# Patient Record
Sex: Male | Born: 1940 | Race: White | Hispanic: No | Marital: Married | Smoking: Former smoker
Health system: Southern US, Community
[De-identification: ages and names within clinical notes are randomized; demographics above are authoritative.]

## PROBLEM LIST (undated history)

## (undated) DIAGNOSIS — C3490 Malignant neoplasm of unspecified part of unspecified bronchus or lung: Secondary | ICD-10-CM

## (undated) DIAGNOSIS — I519 Heart disease, unspecified: Secondary | ICD-10-CM

## (undated) DIAGNOSIS — K5900 Constipation, unspecified: Secondary | ICD-10-CM

## (undated) DIAGNOSIS — I2510 Atherosclerotic heart disease of native coronary artery without angina pectoris: Secondary | ICD-10-CM

## (undated) DIAGNOSIS — D649 Anemia, unspecified: Secondary | ICD-10-CM

## (undated) DIAGNOSIS — R918 Other nonspecific abnormal finding of lung field: Secondary | ICD-10-CM

## (undated) DIAGNOSIS — Z923 Personal history of irradiation: Secondary | ICD-10-CM

## (undated) DIAGNOSIS — C61 Malignant neoplasm of prostate: Secondary | ICD-10-CM

## (undated) DIAGNOSIS — Z9289 Personal history of other medical treatment: Secondary | ICD-10-CM

## (undated) DIAGNOSIS — E785 Hyperlipidemia, unspecified: Secondary | ICD-10-CM

## (undated) DIAGNOSIS — I739 Peripheral vascular disease, unspecified: Secondary | ICD-10-CM

## (undated) DIAGNOSIS — I10 Essential (primary) hypertension: Secondary | ICD-10-CM

## (undated) DIAGNOSIS — IMO0002 Reserved for concepts with insufficient information to code with codable children: Secondary | ICD-10-CM

## (undated) DIAGNOSIS — C7931 Secondary malignant neoplasm of brain: Secondary | ICD-10-CM

## (undated) DIAGNOSIS — I219 Acute myocardial infarction, unspecified: Secondary | ICD-10-CM

## (undated) DIAGNOSIS — C7951 Secondary malignant neoplasm of bone: Secondary | ICD-10-CM

## (undated) HISTORY — DX: Peripheral vascular disease, unspecified: I73.9

## (undated) HISTORY — PX: HERNIA REPAIR: SHX51

## (undated) HISTORY — PX: SHOULDER SURGERY: SHX246

## (undated) HISTORY — PX: CORONARY ARTERY BYPASS GRAFT: SHX141

## (undated) HISTORY — DX: Atherosclerotic heart disease of native coronary artery without angina pectoris: I25.10

## (undated) HISTORY — DX: Essential (primary) hypertension: I10

## (undated) HISTORY — PX: WISDOM TOOTH EXTRACTION: SHX21

## (undated) HISTORY — DX: Acute myocardial infarction, unspecified: I21.9

## (undated) HISTORY — DX: Reserved for concepts with insufficient information to code with codable children: IMO0002

## (undated) HISTORY — DX: Heart disease, unspecified: I51.9

## (undated) HISTORY — DX: Hyperlipidemia, unspecified: E78.5

## (undated) HISTORY — PX: COLONOSCOPY: SHX174

## (undated) HISTORY — PX: ANAL FISSURECTOMY: SUR608

---

## 1998-09-23 HISTORY — PX: PR VEIN BYPASS GRAFT,AORTO-FEM-POP: 35551

## 1999-02-24 ENCOUNTER — Inpatient Hospital Stay (HOSPITAL_COMMUNITY): Admission: EM | Admit: 1999-02-24 | Discharge: 1999-03-10 | Payer: Self-pay | Admitting: Emergency Medicine

## 1999-02-25 ENCOUNTER — Encounter: Payer: Self-pay | Admitting: Cardiology

## 1999-02-26 ENCOUNTER — Encounter: Payer: Self-pay | Admitting: Cardiology

## 1999-03-07 ENCOUNTER — Encounter: Payer: Self-pay | Admitting: Gastroenterology

## 1999-03-29 ENCOUNTER — Encounter (HOSPITAL_COMMUNITY): Admission: RE | Admit: 1999-03-29 | Discharge: 1999-06-27 | Payer: Self-pay | Admitting: Cardiology

## 1999-04-05 ENCOUNTER — Ambulatory Visit (HOSPITAL_COMMUNITY): Admission: RE | Admit: 1999-04-05 | Discharge: 1999-04-05 | Payer: Self-pay | Admitting: Cardiology

## 1999-04-16 ENCOUNTER — Encounter: Payer: Self-pay | Admitting: Cardiothoracic Surgery

## 1999-04-18 ENCOUNTER — Inpatient Hospital Stay (HOSPITAL_COMMUNITY): Admission: RE | Admit: 1999-04-18 | Discharge: 1999-04-23 | Payer: Self-pay | Admitting: Cardiothoracic Surgery

## 1999-04-18 ENCOUNTER — Encounter: Payer: Self-pay | Admitting: Cardiothoracic Surgery

## 1999-04-19 ENCOUNTER — Encounter: Payer: Self-pay | Admitting: Cardiothoracic Surgery

## 1999-04-20 ENCOUNTER — Encounter: Payer: Self-pay | Admitting: Cardiothoracic Surgery

## 1999-04-21 ENCOUNTER — Encounter: Payer: Self-pay | Admitting: Cardiothoracic Surgery

## 1999-05-22 ENCOUNTER — Encounter (HOSPITAL_COMMUNITY): Admission: RE | Admit: 1999-05-22 | Discharge: 1999-08-20 | Payer: Self-pay | Admitting: Cardiology

## 1999-08-21 ENCOUNTER — Encounter (HOSPITAL_COMMUNITY): Admission: RE | Admit: 1999-08-21 | Discharge: 1999-08-27 | Payer: Self-pay | Admitting: Cardiology

## 1999-11-30 ENCOUNTER — Ambulatory Visit (HOSPITAL_BASED_OUTPATIENT_CLINIC_OR_DEPARTMENT_OTHER): Admission: RE | Admit: 1999-11-30 | Discharge: 1999-11-30 | Payer: Self-pay | Admitting: Surgery

## 2000-04-21 ENCOUNTER — Ambulatory Visit (HOSPITAL_BASED_OUTPATIENT_CLINIC_OR_DEPARTMENT_OTHER): Admission: RE | Admit: 2000-04-21 | Discharge: 2000-04-21 | Payer: Self-pay | Admitting: Surgery

## 2000-06-13 ENCOUNTER — Ambulatory Visit (HOSPITAL_BASED_OUTPATIENT_CLINIC_OR_DEPARTMENT_OTHER): Admission: RE | Admit: 2000-06-13 | Discharge: 2000-06-13 | Payer: Self-pay | Admitting: Surgery

## 2000-08-29 ENCOUNTER — Encounter: Payer: Self-pay | Admitting: General Surgery

## 2000-09-02 ENCOUNTER — Ambulatory Visit (HOSPITAL_COMMUNITY): Admission: RE | Admit: 2000-09-02 | Discharge: 2000-09-02 | Payer: Self-pay | Admitting: General Surgery

## 2011-06-10 ENCOUNTER — Other Ambulatory Visit: Payer: Self-pay | Admitting: Urology

## 2011-06-10 DIAGNOSIS — C61 Malignant neoplasm of prostate: Secondary | ICD-10-CM

## 2011-06-19 ENCOUNTER — Encounter (HOSPITAL_COMMUNITY): Payer: Self-pay

## 2011-06-19 ENCOUNTER — Encounter (HOSPITAL_COMMUNITY)
Admission: RE | Admit: 2011-06-19 | Discharge: 2011-06-19 | Disposition: A | Discharge status: Home / Self Care | Payer: Medicare Other | Source: Ambulatory Visit | Attending: Urology | Admitting: Urology

## 2011-06-19 DIAGNOSIS — C61 Malignant neoplasm of prostate: Secondary | ICD-10-CM | POA: Insufficient documentation

## 2011-06-19 HISTORY — DX: Malignant neoplasm of prostate: C61

## 2011-06-19 MED ORDER — TECHNETIUM TC 99M MEDRONATE IV KIT
23.5000 | PACK | Freq: Once | INTRAVENOUS | Status: AC | PRN
  Administered 2011-06-19: 23.5 via INTRAVENOUS

## 2011-07-25 ENCOUNTER — Encounter: Payer: Self-pay | Admitting: Vascular Surgery

## 2011-07-25 DIAGNOSIS — C61 Malignant neoplasm of prostate: Secondary | ICD-10-CM

## 2011-07-25 HISTORY — DX: Malignant neoplasm of prostate: C61

## 2011-07-25 HISTORY — PX: PROSTATE SURGERY: SHX751

## 2011-07-29 ENCOUNTER — Encounter: Payer: Self-pay | Admitting: Vascular Surgery

## 2011-07-30 ENCOUNTER — Ambulatory Visit (INDEPENDENT_AMBULATORY_CARE_PROVIDER_SITE_OTHER): Payer: Medicare Other | Admitting: Vascular Surgery

## 2011-07-30 ENCOUNTER — Encounter: Payer: Self-pay | Admitting: Vascular Surgery

## 2011-07-30 ENCOUNTER — Encounter (HOSPITAL_COMMUNITY): Payer: Self-pay | Admitting: Pharmacy Technician

## 2011-07-30 VITALS — BP 133/64 | HR 82 | Resp 20 | Ht 69.0 in | Wt 193.0 lb

## 2011-07-30 DIAGNOSIS — I714 Abdominal aortic aneurysm, without rupture: Secondary | ICD-10-CM

## 2011-07-30 NOTE — Progress Notes (Signed)
The patient presents today for evaluation of incidental finding of abdominal aortic aneurysm. He was undergoing CT scan for evaluation of diagnosis of prostate cancer. 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 I have reviewed his films and discussed them at length with the patient and his wife present. He denies any symptoms ruffled of this and does not have any past history of aneurysm. He is to go or a prostate resection.  Past Medical History  Diagnosis Date  . Prostate cancer   . Ulcer   . Gout   . Heart disease   . Myocardial infarction   . Hyperlipidemia   . Hypertension     History  Substance Use Topics  . Smoking status: Former Smoker -- 1.5 packs/day for 43 years    Types: Cigarettes    Quit date: 09/23/2001  . Smokeless tobacco: Never Used  . Alcohol Use: 8.4 oz/week    14 Glasses of wine per week    Family History  Problem Relation Age of Onset  . Heart failure Mother   . Diabetes Mother   . Heart disease Father   . Heart disease Brother     No Known Allergies  Current outpatient prescriptions:aspirin EC 81 MG tablet, Take 81 mg by mouth daily.  , Disp: , Rfl: ;  fish oil-omega-3 fatty acids 1000 MG capsule, Take 2 g by mouth daily.  , Disp: , Rfl: ;  folic acid (FOLVITE) 800 MCG tablet, Take 400 mcg by mouth daily.  , Disp: , Rfl: ;  metoprolol (TOPROL-XL) 50 MG 24 hr tablet, Take 50 mg by mouth daily.  , Disp: , Rfl:  Multiple Vitamin (MULTIVITAMIN) capsule, Take 1 capsule by mouth daily.  , Disp: , Rfl: ;  niacin (NIASPAN) 1000 MG CR tablet, Take 1,000 mg by mouth at bedtime.  , Disp: , Rfl: ;  omeprazole (PRILOSEC) 20 MG capsule, Take 20 mg by mouth daily.  , Disp: , Rfl: ;  pravastatin (PRAVACHOL) 40 MG tablet, Take 40 mg by mouth daily.  , Disp: , Rfl: ;  ramipril (ALTACE) 10 MG capsule, Take 10 mg by mouth daily.  , Disp: , Rfl:  triamterene-hydrochlorothiazide (DYAZIDE) 37.5-25 MG per capsule, Take 1 capsule by mouth every  morning.  , Disp: , Rfl: ;  colchicine 0.6 MG tablet, Take 0.6 mg by mouth daily.  , Disp: , Rfl: ;  silodosin (RAPAFLO) 8 MG CAPS capsule, Take 8 mg by mouth daily with breakfast.  , Disp: , Rfl:   BP 133/64  Pulse 82  Resp 20  Ht 5\' 9"  (1.753 m)  Wt 193 lb (87.544 kg)  BMI 28.50 kg/m2  Body mass index is 28.50 kg/(m^2).       Review of systems: No weight loss or gain. GI history of ulcer bleeding. GU urinary frequency. Otherwise negative.  Physical exam: Well-developed well-nourished white male appearing stated age in no acute distress. HEENT normal. Chest clear bilaterally without rales rhonchi or wheezes. Heart regular rate and rhythm. Carotids without bruits bilaterally. 2+ radial pulses bilaterally. 1-2+ right femoral pulse absent left femoral pulse. No palpable pedal pulses bilaterally. Musculoskeletal no major deformities or cyanosis. Abdomen soft nontender no masses noted no aneurysm palpable. Neurologic no focal weakness or paresthesias. Skin no ulcers or rashes. Psychiatric normal affect.  CT scan: Infrarenal small 3.5 cm aneurysm. 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  Impression and plan: Small abdominal aortic aneurysm. Left iliac artery occlusion. He denies any claudication symptoms and is limited by shortness of breath rather than claudication. I explained we would recommend yearly ultrasound followup of his small aneurysm. I explained to be extremely rare 2 at risk of right true to size. I did explain symptoms of leaking aneurysm explain that he would need to go emergently to the [facility] emergency department. We will see him again in one year for continued discussion

## 2011-07-31 ENCOUNTER — Ambulatory Visit (HOSPITAL_COMMUNITY)
Admission: RE | Admit: 2011-07-31 | Discharge: 2011-07-31 | Disposition: A | Discharge status: Home / Self Care | Payer: Medicare Other | Source: Ambulatory Visit | Attending: Urology | Admitting: Urology

## 2011-07-31 ENCOUNTER — Encounter (HOSPITAL_COMMUNITY): Payer: Medicare Other

## 2011-07-31 ENCOUNTER — Encounter (HOSPITAL_COMMUNITY): Payer: Self-pay

## 2011-07-31 DIAGNOSIS — R059 Cough, unspecified: Secondary | ICD-10-CM | POA: Insufficient documentation

## 2011-07-31 DIAGNOSIS — I252 Old myocardial infarction: Secondary | ICD-10-CM | POA: Insufficient documentation

## 2011-07-31 DIAGNOSIS — I10 Essential (primary) hypertension: Secondary | ICD-10-CM | POA: Insufficient documentation

## 2011-07-31 DIAGNOSIS — I2510 Atherosclerotic heart disease of native coronary artery without angina pectoris: Secondary | ICD-10-CM | POA: Insufficient documentation

## 2011-07-31 DIAGNOSIS — Z01818 Encounter for other preprocedural examination: Secondary | ICD-10-CM | POA: Insufficient documentation

## 2011-07-31 DIAGNOSIS — R05 Cough: Secondary | ICD-10-CM | POA: Insufficient documentation

## 2011-07-31 DIAGNOSIS — Z951 Presence of aortocoronary bypass graft: Secondary | ICD-10-CM | POA: Insufficient documentation

## 2011-07-31 DIAGNOSIS — Z87891 Personal history of nicotine dependence: Secondary | ICD-10-CM | POA: Insufficient documentation

## 2011-07-31 LAB — CBC
HCT: 41.1 % (ref 39.0–52.0)
MCHC: 36.5 g/dL — ABNORMAL HIGH (ref 30.0–36.0)
Platelets: 107 10*3/uL — ABNORMAL LOW (ref 150–400)
RDW: 13.1 % (ref 11.5–15.5)

## 2011-07-31 LAB — BASIC METABOLIC PANEL
BUN: 13 mg/dL (ref 6–23)
Calcium: 9.7 mg/dL (ref 8.4–10.5)
Chloride: 100 mEq/L (ref 96–112)
Creatinine, Ser: 0.9 mg/dL (ref 0.50–1.35)
GFR calc Af Amer: >90 mL/min (ref >90)
GFR calc non Af Amer: 84 mL/min — ABNORMAL LOW (ref >90)

## 2011-07-31 LAB — SURGICAL PCR SCREEN: Staphylococcus aureus: NEGATIVE

## 2011-07-31 NOTE — Patient Instructions (Signed)
20 DEV DHONDT  07/31/2011   Your procedure is scheduled on:  08/08/11  Report to [facility] at 5:15 AM.  Call this number if you have problems the morning of surgery: [phone]   Remember:   Do not eat food:After Midnight.  Do not drink clear liquids: After Midnight.  Take these medicines the morning of surgery with A SIP OF WATER: METOPROLOL / PRILOSEC   Do not wear jewelry, make-up or nail polish.  Do not wear lotions, powders, or perfumes. You may wear deodorant.  Do not shave 48 hours prior to surgery.  Do not bring valuables to the hospital.  Contacts, dentures or bridgework may not be worn into surgery.  Leave suitcase in the car. After surgery it may be brought to your room.  For patients admitted to the hospital, checkout time is 11:00 AM the day of discharge.   Patients discharged the day of surgery will not be allowed to drive home.  Name and phone number of your driver:   Special Instructions: Incentive Spirometry - Practice and bring it with you on the day of surgery. and CHG Shower Use Special Wash: 1/2 bottle night before surgery and 1/2 bottle morning of surgery.    FOLLOW BOWEL PREP INSTRUCTIONS   Please read over the following fact sheets that you were given: MRSA Information

## 2011-08-01 ENCOUNTER — Encounter (HOSPITAL_COMMUNITY): Payer: Self-pay

## 2011-08-08 ENCOUNTER — Inpatient Hospital Stay (HOSPITAL_COMMUNITY): Payer: Medicare Other | Admitting: Anesthesiology

## 2011-08-08 ENCOUNTER — Other Ambulatory Visit: Payer: Self-pay | Admitting: Urology

## 2011-08-08 ENCOUNTER — Encounter (HOSPITAL_COMMUNITY): Payer: Self-pay | Admitting: Anesthesiology

## 2011-08-08 ENCOUNTER — Encounter (HOSPITAL_COMMUNITY): Payer: Self-pay | Admitting: *Deleted

## 2011-08-08 ENCOUNTER — Inpatient Hospital Stay (HOSPITAL_COMMUNITY)
Admission: RE | Admit: 2011-08-08 | Discharge: 2011-08-09 | DRG: 708 | Disposition: A | Discharge status: Home / Self Care | Payer: Medicare Other | Source: Ambulatory Visit | Attending: Urology | Admitting: Urology

## 2011-08-08 ENCOUNTER — Encounter (HOSPITAL_COMMUNITY)
Admission: RE | Disposition: A | Discharge status: Home / Self Care | Payer: Self-pay | Source: Ambulatory Visit | Attending: Urology

## 2011-08-08 DIAGNOSIS — M109 Gout, unspecified: Secondary | ICD-10-CM | POA: Diagnosis present

## 2011-08-08 DIAGNOSIS — Z951 Presence of aortocoronary bypass graft: Secondary | ICD-10-CM

## 2011-08-08 DIAGNOSIS — I2510 Atherosclerotic heart disease of native coronary artery without angina pectoris: Secondary | ICD-10-CM | POA: Diagnosis present

## 2011-08-08 DIAGNOSIS — C61 Malignant neoplasm of prostate: Principal | ICD-10-CM | POA: Diagnosis present

## 2011-08-08 DIAGNOSIS — E7800 Pure hypercholesterolemia, unspecified: Secondary | ICD-10-CM | POA: Diagnosis present

## 2011-08-08 DIAGNOSIS — I252 Old myocardial infarction: Secondary | ICD-10-CM

## 2011-08-08 DIAGNOSIS — I10 Essential (primary) hypertension: Secondary | ICD-10-CM | POA: Diagnosis present

## 2011-08-08 HISTORY — PX: ROBOT ASSISTED LAPAROSCOPIC RADICAL PROSTATECTOMY: SHX5141

## 2011-08-08 LAB — BASIC METABOLIC PANEL
BUN: 9 mg/dL (ref 6–23)
Calcium: 8.5 mg/dL (ref 8.4–10.5)
Creatinine, Ser: 1.02 mg/dL (ref 0.50–1.35)
GFR calc Af Amer: 83 mL/min — ABNORMAL LOW (ref >90)
GFR calc non Af Amer: 72 mL/min — ABNORMAL LOW (ref >90)

## 2011-08-08 LAB — ABO/RH: ABO/RH(D): O POS

## 2011-08-08 LAB — HEMOGLOBIN AND HEMATOCRIT, BLOOD: Hemoglobin: 12.6 g/dL — ABNORMAL LOW (ref 13.0–17.0)

## 2011-08-08 SURGERY — ROBOTIC ASSISTED LAPAROSCOPIC RADICAL PROSTATECTOMY LEVEL 2
Anesthesia: General | Site: Abdomen | Laterality: Bilateral | Wound class: Clean Contaminated

## 2011-08-08 MED ORDER — DEXTROSE-NACL 5-0.45 % IV SOLN
INTRAVENOUS | Status: DC
  Administered 2011-08-08: 13:00:00 via INTRAVENOUS

## 2011-08-08 MED ORDER — HYDROMORPHONE HCL PF 1 MG/ML IJ SOLN
0.2500 mg | INTRAMUSCULAR | Status: DC | PRN
  Administered 2011-08-08 (×2): 0.5 mg via INTRAVENOUS

## 2011-08-08 MED ORDER — INDIGOTINDISULFONATE SODIUM 8 MG/ML IJ SOLN
INTRAMUSCULAR | Status: DC | PRN
  Administered 2011-08-08 (×2): 5 mL via INTRAVENOUS

## 2011-08-08 MED ORDER — RAMIPRIL 10 MG PO CAPS
10.0000 mg | ORAL_CAPSULE | ORAL | Status: DC
  Administered 2011-08-09: 10 mg via ORAL
  Filled 2011-08-08 (×2): qty 1

## 2011-08-08 MED ORDER — PANTOPRAZOLE SODIUM 40 MG PO TBEC
40.0000 mg | DELAYED_RELEASE_TABLET | Freq: Every day | ORAL | Status: DC
  Administered 2011-08-08: 40 mg via ORAL
  Filled 2011-08-08: qty 1

## 2011-08-08 MED ORDER — SIMVASTATIN 40 MG PO TABS
40.0000 mg | ORAL_TABLET | Freq: Every day | ORAL | Status: DC
  Administered 2011-08-08: 40 mg via ORAL
  Filled 2011-08-08 (×2): qty 1

## 2011-08-08 MED ORDER — EPHEDRINE SULFATE 50 MG/ML IJ SOLN
INTRAMUSCULAR | Status: DC | PRN
  Administered 2011-08-08: 10 mg via INTRAVENOUS

## 2011-08-08 MED ORDER — SODIUM CHLORIDE 0.9 % IR SOLN
Status: DC | PRN
  Administered 2011-08-08: 200 mL

## 2011-08-08 MED ORDER — LIDOCAINE HCL (PF) 2 % IJ SOLN
INTRAMUSCULAR | Status: DC | PRN
  Administered 2011-08-08: 50 mg

## 2011-08-08 MED ORDER — BELLADONNA ALKALOIDS-OPIUM 16.2-60 MG RE SUPP: 1.0000 | Freq: Four times a day (QID) | RECTAL | Status: DC | PRN

## 2011-08-08 MED ORDER — ACETAMINOPHEN 325 MG PO TABS: 650.0000 mg | ORAL_TABLET | ORAL | Status: DC | PRN

## 2011-08-08 MED ORDER — LACTATED RINGERS IV SOLN
INTRAVENOUS | Status: DC | PRN
  Administered 2011-08-08: 08:00:00

## 2011-08-08 MED ORDER — HYDROMORPHONE HCL PF 1 MG/ML IJ SOLN
INTRAMUSCULAR | Status: DC | PRN
  Administered 2011-08-08: 1 mg via INTRAVENOUS

## 2011-08-08 MED ORDER — CEFAZOLIN SODIUM 1-5 GM-% IV SOLN
1.0000 g | Freq: Three times a day (TID) | INTRAVENOUS | Status: DC
  Filled 2011-08-08 (×2): qty 50

## 2011-08-08 MED ORDER — MORPHINE SULFATE 2 MG/ML IJ SOLN: 2.0000 mg | INTRAMUSCULAR | Status: DC | PRN

## 2011-08-08 MED ORDER — LACTATED RINGERS IV SOLN: INTRAVENOUS | Status: DC

## 2011-08-08 MED ORDER — PROPOFOL 10 MG/ML IV EMUL
INTRAVENOUS | Status: DC | PRN
  Administered 2011-08-08: 160 mg via INTRAVENOUS

## 2011-08-08 MED ORDER — ZOLPIDEM TARTRATE 5 MG PO TABS: 5.0000 mg | ORAL_TABLET | Freq: Every evening | ORAL | Status: DC | PRN

## 2011-08-08 MED ORDER — MIDAZOLAM HCL 5 MG/5ML IJ SOLN
INTRAMUSCULAR | Status: DC | PRN
  Administered 2011-08-08: 2 mg via INTRAVENOUS

## 2011-08-08 MED ORDER — BISACODYL 10 MG RE SUPP: 10.0000 mg | Freq: Every day | RECTAL | Status: DC | PRN

## 2011-08-08 MED ORDER — METOPROLOL TARTRATE 50 MG PO TABS
50.0000 mg | ORAL_TABLET | Freq: Two times a day (BID) | ORAL | Status: DC
  Administered 2011-08-08 – 2011-08-09 (×2): 50 mg via ORAL
  Filled 2011-08-08 (×3): qty 1

## 2011-08-08 MED ORDER — ACETAMINOPHEN 10 MG/ML IV SOLN
INTRAVENOUS | Status: DC | PRN
  Administered 2011-08-08: 1000 mg via INTRAVENOUS

## 2011-08-08 MED ORDER — BUPIVACAINE-EPINEPHRINE 0.25% -1:200000 IJ SOLN
INTRAMUSCULAR | Status: DC | PRN
  Administered 2011-08-08: 30 mL

## 2011-08-08 MED ORDER — ONDANSETRON HCL 4 MG/2ML IJ SOLN
INTRAMUSCULAR | Status: DC | PRN
  Administered 2011-08-08: 4 mg via INTRAVENOUS

## 2011-08-08 MED ORDER — TRIAMTERENE-HCTZ 37.5-25 MG PO CAPS
1.0000 | ORAL_CAPSULE | ORAL | Status: DC
  Administered 2011-08-09: 1 via ORAL
  Filled 2011-08-08 (×2): qty 1

## 2011-08-08 MED ORDER — GLYCOPYRROLATE 0.2 MG/ML IJ SOLN
INTRAMUSCULAR | Status: DC | PRN
  Administered 2011-08-08: .6 mg via INTRAVENOUS

## 2011-08-08 MED ORDER — SUCCINYLCHOLINE CHLORIDE 20 MG/ML IJ SOLN
INTRAMUSCULAR | Status: DC | PRN
  Administered 2011-08-08: 100 mg via INTRAVENOUS

## 2011-08-08 MED ORDER — PHENOL 1.4 % MT LIQD: 1.0000 | OROMUCOSAL | Status: DC | PRN

## 2011-08-08 MED ORDER — HYDROCODONE-ACETAMINOPHEN 5-325 MG PO TABS: 1.0000 | ORAL_TABLET | ORAL | Status: DC | PRN

## 2011-08-08 MED ORDER — CEFAZOLIN SODIUM-DEXTROSE 2-3 GM-% IV SOLR
2.0000 g | Freq: Once | INTRAVENOUS | Status: AC
  Administered 2011-08-08: 2 g via INTRAVENOUS
  Filled 2011-08-08: qty 50

## 2011-08-08 MED ORDER — DEXAMETHASONE SODIUM PHOSPHATE 4 MG/ML IJ SOLN
INTRAMUSCULAR | Status: DC | PRN
  Administered 2011-08-08: 10 mg via INTRAVENOUS

## 2011-08-08 MED ORDER — FENTANYL CITRATE 0.05 MG/ML IJ SOLN
INTRAMUSCULAR | Status: DC | PRN
  Administered 2011-08-08: 100 ug via INTRAVENOUS

## 2011-08-08 MED ORDER — SODIUM CHLORIDE 0.9 % IV BOLUS (SEPSIS)
1000.0000 mL | Freq: Once | INTRAVENOUS | Status: AC
  Administered 2011-08-08: 1000 mL via INTRAVENOUS

## 2011-08-08 MED ORDER — MENTHOL 3 MG MT LOZG
1.0000 | LOZENGE | OROMUCOSAL | Status: DC | PRN
  Filled 2011-08-08: qty 9

## 2011-08-08 MED ORDER — NEOSTIGMINE METHYLSULFATE 1 MG/ML IJ SOLN
INTRAMUSCULAR | Status: DC | PRN
  Administered 2011-08-08: 5 mg via INTRAVENOUS

## 2011-08-08 MED ORDER — LACTATED RINGERS IV SOLN
INTRAVENOUS | Status: DC | PRN
  Administered 2011-08-08 (×4): via INTRAVENOUS

## 2011-08-08 MED ORDER — NIACIN ER (ANTIHYPERLIPIDEMIC) 500 MG PO TBCR
2000.0000 mg | EXTENDED_RELEASE_TABLET | Freq: Every evening | ORAL | Status: DC
  Administered 2011-08-08: 2000 mg via ORAL
  Filled 2011-08-08 (×2): qty 4

## 2011-08-08 MED ORDER — KETOROLAC TROMETHAMINE 15 MG/ML IJ SOLN
15.0000 mg | Freq: Four times a day (QID) | INTRAMUSCULAR | Status: DC
  Administered 2011-08-08 – 2011-08-09 (×3): 15 mg via INTRAVENOUS
  Filled 2011-08-08 (×6): qty 1

## 2011-08-08 MED ORDER — CEFAZOLIN SODIUM-DEXTROSE 2-3 GM-% IV SOLR
2.0000 g | Freq: Three times a day (TID) | INTRAVENOUS | Status: DC
  Filled 2011-08-08 (×2): qty 50

## 2011-08-08 MED ORDER — ROCURONIUM BROMIDE 100 MG/10ML IV SOLN
INTRAVENOUS | Status: DC | PRN
  Administered 2011-08-08: 10 mg via INTRAVENOUS
  Administered 2011-08-08: 5 mg via INTRAVENOUS
  Administered 2011-08-08: 50 mg via INTRAVENOUS
  Administered 2011-08-08: 5 mg via INTRAVENOUS

## 2011-08-08 MED ORDER — ONDANSETRON HCL 4 MG/2ML IJ SOLN: 4.0000 mg | INTRAMUSCULAR | Status: DC | PRN

## 2011-08-08 MED ORDER — CEFAZOLIN SODIUM-DEXTROSE 2-3 GM-% IV SOLR
2.0000 g | Freq: Three times a day (TID) | INTRAVENOUS | Status: AC
  Administered 2011-08-08 – 2011-08-09 (×2): 2 g via INTRAVENOUS
  Filled 2011-08-08 (×2): qty 50

## 2011-08-08 MED ORDER — STERILE WATER FOR IRRIGATION IR SOLN
Status: DC | PRN
  Administered 2011-08-08: 3000 mL

## 2011-08-08 SURGICAL SUPPLY — 34 items
CANISTER SUCTION 2500CC (MISCELLANEOUS) ×2 IMPLANT
CATH ROBINSON RED A/P 8FR (CATHETERS) ×2 IMPLANT
CHLORAPREP W/TINT 26ML (MISCELLANEOUS) ×2 IMPLANT
CLIP LIGATING HEM O LOK PURPLE (MISCELLANEOUS) ×4 IMPLANT
CLOTH BEACON ORANGE TIMEOUT ST (SAFETY) ×2 IMPLANT
CORD HIGH FREQUENCY UNIPOLAR (ELECTROSURGICAL) ×1 IMPLANT
COVER SURGICAL LIGHT HANDLE (MISCELLANEOUS) ×2 IMPLANT
COVER TIP SHEARS 8 DVNC (MISCELLANEOUS) ×1 IMPLANT
COVER TIP SHEARS 8MM DA VINCI (MISCELLANEOUS) ×1
CUTTER LINEAR FLEX ECHELON 45 (STAPLE) ×2 IMPLANT
DECANTER SPIKE VIAL GLASS SM (MISCELLANEOUS) ×1 IMPLANT
DRAPE SURG IRRIG POUCH 19X23 (DRAPES) ×2 IMPLANT
DRAPE UTILITY XL STRL (DRAPES) ×2 IMPLANT
DRSG TEGADERM 6X8 (GAUZE/BANDAGES/DRESSINGS) ×4 IMPLANT
ELECT REM PT RETURN 9FT ADLT (ELECTROSURGICAL) ×2
ELECTRODE REM PT RTRN 9FT ADLT (ELECTROSURGICAL) ×1 IMPLANT
GLOVE BIOGEL M STRL SZ7.5 (GLOVE) ×4 IMPLANT
GOWN STRL NON-REIN LRG LVL3 (GOWN DISPOSABLE) ×6 IMPLANT
HOLDER FOLEY CATH W/STRAP (MISCELLANEOUS) ×2 IMPLANT
IV LACTATED RINGERS 1000ML (IV SOLUTION) ×2 IMPLANT
KIT ACCESSORY DA VINCI DISP (KITS) ×1
KIT ACCESSORY DVNC DISP (KITS) ×1 IMPLANT
NDL SAFETY ECLIPSE 18X1.5 (NEEDLE) ×1 IMPLANT
NEEDLE HYPO 18GX1.5 SHARP (NEEDLE) ×2
PACK ROBOT UROLOGY CUSTOM (CUSTOM PROCEDURE TRAY) ×2 IMPLANT
POSITIONER SURGICAL ARM (MISCELLANEOUS) ×2 IMPLANT
RELOAD GREEN ECHELON 45 (STAPLE) ×2 IMPLANT
SET TUBE IRRIG SUCTION NO TIP (IRRIGATION / IRRIGATOR) ×2 IMPLANT
SOLUTION ELECTROLUBE (MISCELLANEOUS) ×2 IMPLANT
SPONGE GAUZE 4X4 12PLY (GAUZE/BANDAGES/DRESSINGS) ×2 IMPLANT
SUT VICRYL 0 UR6 27IN ABS (SUTURE) ×4 IMPLANT
SYR 27GX1/2 1ML LL SAFETY (SYRINGE) ×2 IMPLANT
TOWEL OR NON WOVEN STRL DISP B (DISPOSABLE) ×2 IMPLANT
WATER STERILE IRR 1500ML POUR (IV SOLUTION) ×4 IMPLANT

## 2011-08-08 NOTE — Interval H&P Note (Signed)
History and Physical Interval Note:   08/08/2011   6:51 AM   Tony [name]  has presented today for surgery, with the diagnosis of prostate cancer   The various methods of treatment have been discussed with the patient and family. After consideration of risks, benefits and other options for treatment, the patient has consented to  Procedure(s): ROBOTIC ASSISTED LAPAROSCOPIC RADICAL PROSTATECTOMY and BPLND as a surgical intervention .  The patients' history has been reviewed, patient examined, no change in status, stable for surgery.  I have reviewed the patients' chart and labs.  Questions were answered to the patient's satisfaction.     [name],LES  MD

## 2011-08-08 NOTE — Transfer of Care (Signed)
Immediate Anesthesia Transfer of Care Note  Patient: Tony [name]  Procedure(s) Performed:  ROBOTIC ASSISTED LAPAROSCOPIC RADICAL PROSTATECTOMY LEVEL 2 - with Bilateral Pelvic Lymphadenectomy   Patient Location: PACU  Anesthesia Type: [name]  Level of Consciousness: awake and sedated  Airway & Oxygen Therapy: Patient Spontanous Breathing and Patient connected to face mask oxygen  Post-op Assessment: Report given to PACU RN and Post -op Vital signs reviewed and stable  Post vital signs: Reviewed and stable  Complications: No apparent anesthesia complications

## 2011-08-08 NOTE — Preoperative (Signed)
Beta Blockers   Reason not to administer Beta Blockers:Not Applicable 

## 2011-08-08 NOTE — Progress Notes (Signed)
Ambulated pt. Around entire unit. Pt. Tolerated ambulating very well. He did not c/o any pain/nausea/dizziness. Urine output did increase as we were ambulating around unit but remains dark. Will continue to monitor urinary output and ambulate patient again prior to shift change.

## 2011-08-08 NOTE — Op Note (Signed)
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 Robotic assisted laparoscopic radical prostatectomy (non nerve sparing) 2. Bilateral robotic assisted laparoscopic pelvic lymphadenectomy  Surgeon: Moody Bruins. M.D.  Assistant(s): Pecola Leisure, [place]  Anesthesia: General  Complications: None  EBL: 100 mL  IVF:  2000 mL crystalloid  Specimens: 1. Prostate and seminal vesicles 2. Right pelvic lymph nodes 3. Left pelvic lymph nodes  Disposition of specimens: Pathology  Drains: 1. 20 Fr coude catheter 2. # 19 Blake pelvic drain  Indication: Tony [name] is a 70 y.o. year old patient with clinically localized prostate cancer.  After a thorough review of the management options for treatment of prostate cancer, he elected to proceed with surgical therapy and the above procedure(s).  We have discussed the potential benefits and risks of the procedure, side effects of the proposed treatment, the likelihood of the patient achieving the goals of the procedure, and any potential problems that might occur during the procedure or recuperation. Informed consent has been obtained.  Description of procedure:  The patient was taken to the operating room and a general anesthetic was administered. He was given preoperative antibiotics, placed in the dorsal lithotomy position, and prepped and draped in the usual sterile fashion. Next a preoperative timeout was performed. A urethral catheter was placed into the bladder and a site was selected near the umbilicus for placement of the camera port. This was placed using a standard open Hassan technique which allowed entry into the peritoneal cavity under direct vision and without difficulty. A 12 mm port was placed and a pneumoperitoneum established. The camera was then  used to inspect the abdomen and there was no evidence of any intra-abdominal injuries or other abnormalities. The remaining abdominal ports were then placed. 8 mm robotic ports were placed in the right lower quadrant, left lower quadrant, and far left lateral abdominal wall. A 5 mm port was placed in the right upper quadrant and a 12 mm port was placed in the right lateral abdominal wall for laparoscopic assistance. All ports were placed under direct vision without difficulty. The surgical cart was then docked.   Utilizing the cautery scissors, the bladder was reflected posteriorly allowing entry into the space of Retzius and identification of the endopelvic fascia and prostate. The periprostatic fat was then removed from the prostate allowing full exposure of the endopelvic fascia. The endopelvic fascia was then incised from the apex back to the base of the prostate bilaterally and the underlying levator muscle fibers were swept laterally off the prostate thereby isolating the dorsal venous complex. The dorsal vein was then stapled and divided with a 45 mm Flex Echelon stapler. Attention then turned to the bladder neck which was divided anteriorly thereby allowing entry into the bladder and exposure of the urethral catheter. The catheter balloon was deflated and the catheter was brought into the operative field and used to retract the prostate anteriorly. The posterior bladder neck was then examined and was divided allowing further dissection between the bladder and prostate posteriorly until the vasa deferentia and seminal vessels were identified. The vasa deferentia were isolated, divided, and lifted anteriorly. The seminal vesicles were dissected down to their tips with care to control the seminal vascular arterial blood supply. These structures were then lifted anteriorly and the space between Denonvillier's fascia and the anterior rectum was developed with a combination  of sharp and blunt dissection. This  isolated the vascular pedicles of the prostate.  A wide non nerve sparing dissection was performed with Weck clips used to ligate the vascular pedicles of the prostate bilaterally. The vascular pedicles of the prostate were then divided.  The urethra was then sharply transected allowing the prostate specimen to be disarticulated. The pelvis was copiously irrigated and hemostasis was ensured. There was no evidence for rectal injury.  Attention then turned to the right pelvic sidewall. The fibrofatty tissue between the external iliac vein, confluence of the iliac vessels, hypogastric artery, and Cooper's ligament was dissected free from the pelvic sidewall with care to preserve the obturator nerve. Weck clips were used for lymphostasis and hemostasis. An identical procedure was performed on the contralateral side and the lymphatic packets were removed for permanent pathologic analysis.  Attention then turned to the urethral anastomosis. A 2-0 Vicryl slip knot was placed between Denonvillier's fascia, the posterior bladder neck, and the posterior urethra to reapproximate these structures. A double-armed 3-0 Monocryl suture was then used to perform a 360 running tension-free anastomosis between the bladder neck and urethra. A new urethral catheter was then placed into the bladder and irrigated. There were no blood clots within the bladder and the anastomosis appeared to be watertight. A #19 Blake drain was then brought through the left lateral 8 mm port site and positioned appropriately within the pelvis. It was secured to the skin with a nylon suture. The surgical cart was then undocked. The right lateral 12 mm port site was closed at the fascial level with a 0 Vicryl suture placed laparoscopically. All remaining ports were then removed under direct vision. The prostate specimen was removed intact within the Endopouch retrieval bag via the periumbilical camera port site. This fascial opening was closed with  two running 0 Vicryl sutures. 0.25% Marcaine was then injected into all port sites and all incisions were reapproximated at the skin level with staples. Sterile dressings were applied. The patient appeared to tolerate the procedure well and without complications. The patient was able to be extubated and transferred to the recovery unit in satisfactory condition.  Moody Bruins MD

## 2011-08-08 NOTE — Progress Notes (Signed)
Post-op note  Subjective: The patient is doing well.  No complaints.  Objective: Vital signs in last 24 hours: Temp:  [97.3 F (36.3 C)-98.3 F (36.8 C)] 97.9 F (36.6 C) (11/15 1500) Pulse Rate:  [60-72] 72  (11/15 1500) Resp:  [12-18] 18  (11/15 1500) BP: (107-149)/(45-75) 141/75 mmHg (11/15 1500) SpO2:  [98 %-100 %] 99 % (11/15 1500) Weight:  [94.802 kg (209 lb)] 209 lb (94.802 kg) (11/15 1500)  Intake/Output from previous day:   Intake/Output this shift: Total I/O In: 4380 [I.V.:3100; Other:280; IV Piggyback:1000] Out: 320 [Drains:120; Blood:200]  Physical Exam:  General: Alert and oriented. Abdomen: Soft, Nondistended. Incisions: Clean and dry. GU: Urine is draining well.  Lab Results:  Basename 08/08/11 1100  HGB 12.6*  HCT 36.3*    Assessment/Plan: POD#0   1) Continue to monitor   Moody Bruins. MD   LOS: 0 days   Vylette Strubel,LES 08/08/2011, 4:40 PM

## 2011-08-08 NOTE — Anesthesia Postprocedure Evaluation (Signed)
  Anesthesia Post-op Note  Patient: Tony [name]  Procedure(s) Performed:  ROBOTIC ASSISTED LAPAROSCOPIC RADICAL PROSTATECTOMY LEVEL 2 - with Bilateral Pelvic Lymphadenectomy   Patient Location: PACU  Anesthesia Type: General  Level of Consciousness: awake and alert   Airway and Oxygen Therapy: Patient Spontanous Breathing  Post-op Pain: mild  Post-op Assessment: Post-op Vital signs reviewed, Patient's Cardiovascular Status Stable, Respiratory Function Stable, Patent Airway and No signs of Nausea or vomiting  Post-op Vital Signs: stable  Complications: No apparent anesthesia complications

## 2011-08-08 NOTE — Discharge Summary (Signed)
Date of admission: 08/08/2011  Date of discharge: 08/09/2011  Admission diagnosis: Prostate Cancer  Discharge diagnosis: Prostate Cancer  History and Physical: For full details, please see admission history and physical. Briefly, Tony [name] is a 70 y.o. gentleman with localized prostate cancer.  After discussing management/treatment options, he elected to proceed with surgical treatment.  Hospital Course: SEDRIC GUIA was taken to the operating room on 08/08/2011 and underwent a robotic assisted laparoscopic radical prostatectomy. He tolerated this procedure well and without complications. Postoperatively, he was able to be transferred to a regular hospital room following recovery from anesthesia.  He was able to begin ambulating the night of surgery. He remained hemodynamically stable overnight.    He had mildly decreased urine output with appropriately minimal output from his pelvic drain and his pelvic drain was removed on POD #1.  His foley was irrigated successfully in the PACU as well as in his room prior to D/C with adequate return each time.  A JP Cr was checked on POD 1 and was normal. He was transitioned to oral pain medication, tolerated a clear liquid diet, and had met all discharge criteria and was able to be discharged home later on POD#1.  Laboratory values:  Basename 08/09/11 0502 08/08/11 1100  HGB 12.4* 12.6*  HCT 34.2* 36.3*   JP Creatine: 1.0  Disposition: Home  Discharge instruction: He was instructed to be ambulatory but to refrain from heavy lifting, strenuous activity, or driving. He was instructed on urethral catheter care.  He may resume taking aspirin and supplements approx 7 days after surgery.  Discharge medications:   Medication List  As of 08/09/2011  1:18 PM   START taking these medications         ciprofloxacin 500 MG tablet   Commonly known as: CIPRO   Take 1 tablet (500 mg total) by mouth 2 (two) times daily. Start day prior to office visit for  foley removal      HYDROcodone-acetaminophen 5-325 MG per tablet   Commonly known as: NORCO   Take 1-2 tablets by mouth every 6 (six) hours as needed (pain).         CONTINUE taking these medications         colchicine 0.6 MG tablet      metoprolol 50 MG tablet   Commonly known as: LOPRESSOR      niacin 1000 MG CR tablet   Commonly known as: NIASPAN      omeprazole 20 MG capsule   Commonly known as: PRILOSEC      pravastatin 40 MG tablet   Commonly known as: PRAVACHOL      ramipril 10 MG capsule   Commonly known as: ALTACE      triamterene-hydrochlorothiazide 37.5-25 MG per capsule   Commonly known as: DYAZIDE         STOP taking these medications         aspirin EC 81 MG tablet      fish oil-omega-3 fatty acids 1000 MG capsule      folic acid 800 MCG tablet      JALYN 0.5-0.4 MG Caps      multivitamin capsule          Where to get your medications    These are the prescriptions that you need to pick up.   You may get these medications from any pharmacy.         ciprofloxacin 500 MG tablet   HYDROcodone-acetaminophen 5-325 MG per tablet  Followup: He will followup in 1 week for catheter removal, staple removal, and to discuss his surgical pathology results.

## 2011-08-08 NOTE — Progress Notes (Signed)
Pt did bowel prep as instructed on 08/07/11 with good results

## 2011-08-08 NOTE — Anesthesia Procedure Notes (Signed)
Procedure Name: Intubation Date/Time: 08/08/2011 7:20 AM Performed by: Joycie Peek Pre-anesthesia Checklist: Patient identified, Emergency Drugs available, Suction available, Patient being monitored and Timeout performed Patient Re-evaluated:Patient Re-evaluated prior to inductionOxygen Delivery Method: Circle System Utilized Preoxygenation: Pre-oxygenation with 100% oxygen Intubation Type: IV induction Ventilation: Mask ventilation without difficulty Laryngoscope Size: Mac and 4 Grade View: Grade II Tube type: Oral Tube size: 7.5 mm Number of attempts: 1 Placement Confirmation: ETT inserted through vocal cords under direct vision,  positive ETCO2 and breath sounds checked- equal and bilateral Secured at: 23 cm Tube secured with: Tape Dental Injury: Teeth and Oropharynx as per pre-operative assessment

## 2011-08-08 NOTE — Anesthesia Preprocedure Evaluation (Addendum)
Anesthesia Evaluation  Patient identified by MRN, date of birth, ID band Patient awake    Reviewed: Allergy & Precautions, H&P , NPO status , Patient's Chart, lab work & pertinent test results  Airway Mallampati: II TM Distance: >3 FB Neck ROM: Full    Dental  (+) Teeth Intact and Caps   Pulmonary neg pulmonary ROS,    Pulmonary exam normal       Cardiovascular hypertension, + CAD, + Past MI, + CABG and neg cardio ROS Regular Normal    Neuro/Psych Negative Neurological ROS  Negative Psych ROS   GI/Hepatic negative GI ROS, Neg liver ROS,   Endo/Other  Negative Endocrine ROS  Renal/GU negative Renal ROS  Genitourinary negative   Musculoskeletal negative musculoskeletal ROS (+)   Abdominal Normal abdominal exam  (+)   Peds negative pediatric ROS (+)  Hematology negative hematology ROS (+)   Anesthesia Other Findings   Reproductive/Obstetrics negative OB ROS                           Anesthesia Physical Anesthesia Plan  ASA: III  Anesthesia Plan: General   Post-op Pain Management:    Induction: Intravenous  Airway Management Planned: Oral ETT  Additional Equipment:   Intra-op Plan:   Post-operative Plan: Extubation in OR  Informed Consent: I have reviewed the patients History and Physical, chart, labs and discussed the procedure including the risks, benefits and alternatives for the proposed anesthesia with the patient or authorized representative who has indicated his/her understanding and acceptance.   Dental advisory given  Plan Discussed with:   Anesthesia Plan Comments:         Anesthesia Quick Evaluation

## 2011-08-08 NOTE — H&P (Addendum)
Chief Complaint  Prostate Cancer   Reason For Visit  Reason for consult: To consider surgical treatment for prostate cancer with a robotic prostatectomy. Physician requesting consult: Dr. Marcine Matar PCP: Dr. Camillo Flaming   History of Present Illness  Tony [name] is a 70 year old who was initially diagnosed with prostate cancer by Dr. Retta Diones in July 2009.  His presenting PSA was 7.26 and his biopsy at the time indicated 2 out of 12 biopsy cores positive for Gleason 3+3=6 adenocarcinoma with less than 5% involvement of each core. He elected active surveillance as a management strategy after discussing all available options.  In May 2012, his PSA was noted to have increased to 11.18 and he underwent a surveillance biopsy in September 2012 which demonstrated 3 out of 12 biopsy cores to be positive for malignacy with Gleason 3+4-7 adenocarcinoma now noted along with areas of intraductal carcinoma and cribiform glands concerning for higher risk disease. He has no family history of prostate cancer. He has undergone staging studies with a bone scan (06/19/11) and CT scan (06/12/11) which did not demonstrate evidence for metastatic disease.  His CT scan did demonstrate a 3.5 cm infrarenal abdominal aortic aneurysm. He was seen by Dr. Gretta [name] and is going undergo observation for his AAA.  He has a history of CAD and prior MI and CABG in 2000.  His cardiologist is Dr. Viann Fish.  He underwent a stress test last week and reportedly had no inducible ischemia.  TNM stage: cT1c N0 M0 PSA: 11.18 Gleason score/grade: 3+4=7 with intraductal carcinoma Biopsy (06/03/11): 3 /12 cores -- R mid (10%, 3+4=7 with adjacent intraductal carcinoma), R base (30%, intraductal carcinoma), R lateral base (5%, 3+3=6) Prostate volume: 105.4 cc  Urinary function: He has known BPH and has been treated with alpha blockers and 5 alpha reductase inhibitors with improvement of his symptoms. IPSS: 14. Erectile function: He  has severe erectile dysfunction. SHIM score: 6.   Past Medical History Problems  1. History of  Acute Myocardial Infarction V12.59 2. History of  Aneurysm Of The Abdominal Aorta 441.4 3. History of  Gastric Ulcer 531.90 4. History of  Gout 274.9 5. History of  Heart Disease 429.9 6. History of  Hypercholesterolemia 272.0 7. History of  Hypertension 401.9  Surgical History Problems  1. History of  Anal Fissurectomy 2. History of  CABG (CABG) V45.81  Current Meds 1. Aspirin 81 MG Oral Tablet Chewable; Therapy: (Recorded:10Mar2009) to 2. Colcrys 0.6 MG Oral Tablet; Therapy: 13Jan2012 to 3. Fish Oil Concentrate 1000 MG Oral Capsule; Therapy: (Recorded:13Sep2012) to 4. Folic Acid 800 MCG Oral Tablet; Therapy: (Recorded:10Mar2009) to 5. Metoprolol Tartrate 50 MG Oral Tablet; Therapy: (Recorded:10Mar2009) to 6. Multi-Vitamin TABS; Therapy: (Recorded:10Mar2009) to 7. Niaspan 1000 MG Oral Tablet Extended Release; TAKE 2 TABLET Daily; Therapy:  (Recorded:07Sep2010) to 8. Omeprazole 20 MG Oral Capsule Delayed Release; Therapy: (Recorded:10Mar2009) to 9. Pravastatin Sodium 40 MG Oral Tablet; Therapy: (Recorded:10Mar2009) to 10. Ramipril 10 MG Oral Capsule; Therapy: (Recorded:10Mar2009) to 11. Rapaflo 8 MG Oral Capsule; 1 po q day; Therapy: 13Sep2012 to (Last Rx:13Sep2012) 12. Triamterene-HCTZ 37.5-25 MG Oral Tablet; Therapy: (Recorded:10Mar2009) to  Allergies Medication  1. No Known Drug Allergies  Family History Problems  1. Maternal history of  Congestive Heart Failure 2. Maternal history of  Diabetes Mellitus V18.0 3. Sororal history of  Diabetes Mellitus V18.0 4. Paternal history of  Heart Disease V17.49 5. Fraternal history of  Heart Disease V17.49 6. Sororal history of  Heart Disease V17.49  Social History Problems    Alcohol Use 2 glasses of wine a day   Former Smoker 1 1/2 ppd for 43 years. Quit 9 years ago   Marital History - Currently Married   Occupation:  Retired  Review of Systems Constitutional, skin, eye, otolaryngeal, hematologic/lymphatic, cardiovascular, pulmonary, endocrine, musculoskeletal, gastrointestinal, neurological and psychiatric system(s) were reviewed and pertinent findings if present are noted.  Cardiovascular: no chest pain and no leg swelling.  Respiratory: no shortness of breath during exertion.    Vitals Vital Signs [Data Includes: Last 1 Day]  06Nov2012 11:18AM  BMI Calculated: 28.58 BSA Calculated: 2.03 Height: 5 ft 9 in Weight: 193 lb  Blood Pressure: 117 / 68 Heart Rate: 62  Physical Exam Constitutional: Well nourished and well developed . No acute distress.  ENT:. The ears and nose are normal in appearance.  Neck: The appearance of the neck is normal and no neck mass is present.  Pulmonary: No respiratory distress, normal respiratory rhythm and effort and clear bilateral breath sounds.  Cardiovascular: Heart rate and rhythm are normal . No peripheral edema.  Abdomen: The abdomen is soft and nontender. No masses are palpated. No CVA tenderness. No hernias are palpable. No hepatosplenomegaly noted.  Rectal: Rectal exam demonstrates normal sphincter tone, no tenderness and no masses. Prostate size is estimated to be 60 g. The prostate has no nodularity and is not tender. The left seminal vesicle is nonpalpable. The right seminal vesicle is nonpalpable. The perineum is normal on inspection.  Lymphatics: The femoral and inguinal nodes are not enlarged or tender.  Skin: Normal skin turgor, no visible rash and no visible skin lesions.  Neuro/Psych:. Mood and affect are appropriate.    Results/Data  I reviewed his medical records, PSA results, pathology reports, CT scan and bone scan independently with findings as dictated above.     Assessment Assessed  1. Prostate Cancer 185  Discussion/Summary  1. Prostate cancer:   The patient was counseled about the natural history of prostate cancer and the standard  treatment options that are available for prostate cancer. It was explained to him how his age and life expectancy, clinical stage, Gleason score, and PSA affect his prognosis, the decision to proceed with additional staging studies, as well as how that information influences recommended treatment strategies. We discussed the roles for active surveillance, radiation therapy, surgical therapy, androgen deprivation, as well as ablative therapy options for the treatment of prostate cancer as appropriate to his individual cancer situation. We discussed the risks and benefits of these options with regard to their impact on cancer control and also in terms of potential adverse events, complications, and impact on quiality of life particularly related to urinary, bowel, and sexual function. The patient was encouraged to ask questions throughout the discussion today and all questions were answered to his stated satisfaction. In addition, the patient was provided with and/or directed to appropriate resources and literature for further education about prostate cancer and treatment options.   We discussed surgical therapy for prostate cancer including the different available surgical approaches. We discussed, in detail, the risks and expectations of surgery with regard to cancer control, urinary control, and erectile function as well as the expected postoperative recovery process. Additional risks of surgery including but not limited to bleeding, infection, hernia formation, nerve damage, lymphocele formation, bowel/rectal injury potentially necessitating colostomy, damage to the urinary tract resulting in urine leakage, urethral stricture, and the cardiopulmonary risks such as myocardial infarction, stroke, death, venothromboembolism, etc. were explained. The  risk of open surgical conversion for robotic/laparoscopic prostatectomy was also discussed.     We discussed his diagnosis of intraductal carcinoma and the fact this  likely represents aggressive disease. I agree with proceeding with curative treatment at this point then have reviewed the pros and cons of surgery and radiation therapy. Considering his pathologic diagnosis, significantly enlarged prostate with voiding symptoms, and history of anal fissure repair, he understands there are some advantages of surgery over radiation and also has a clear understanding of the perioperative risks involved considering his comorbidities. He also understands the increased risk of urinary incontinence based on his age. He does wish to proceed with surgical therapy. I will see if he can be worked in for physical therapy prior to his planned surgery date.  I will plan to await his recent results from his stress test and he has been scheduled for a non-nerve sparing robotic prostatectomy and bilateral pelvic lymphadenectomy.  CC: Dr. Camillo Flaming Dr. Marcine Matar Dr. Tawanna Cooler Early Dr. Viann Fish    SignaturesElectronically signed by : Heloise Purpura, M.D.; Jul 30 2011  6:00PM

## 2011-08-09 ENCOUNTER — Encounter (HOSPITAL_COMMUNITY): Payer: Self-pay | Admitting: Urology

## 2011-08-09 LAB — BASIC METABOLIC PANEL
BUN: 10 mg/dL (ref 6–23)
Calcium: 8.2 mg/dL — ABNORMAL LOW (ref 8.4–10.5)
Creatinine, Ser: 1.04 mg/dL (ref 0.50–1.35)
GFR calc Af Amer: 81 mL/min — ABNORMAL LOW (ref >90)
GFR calc non Af Amer: 70 mL/min — ABNORMAL LOW (ref >90)
Glucose, Bld: 176 mg/dL — ABNORMAL HIGH (ref 70–99)

## 2011-08-09 MED ORDER — HYDROCODONE-ACETAMINOPHEN 5-325 MG PO TABS: 1.0000 | ORAL_TABLET | Freq: Four times a day (QID) | ORAL | Status: DC | PRN

## 2011-08-09 MED ORDER — CIPROFLOXACIN HCL 500 MG PO TABS: 500.0000 mg | ORAL_TABLET | Freq: Two times a day (BID) | ORAL | Status: AC

## 2011-08-09 MED ORDER — HYDROCODONE-ACETAMINOPHEN 5-325 MG PO TABS: 1.0000 | ORAL_TABLET | Freq: Four times a day (QID) | ORAL | Status: AC | PRN

## 2011-08-09 MED ORDER — BISACODYL 10 MG RE SUPP
10.0000 mg | Freq: Once | RECTAL | Status: AC
  Administered 2011-08-09: 10 mg via RECTAL
  Filled 2011-08-09: qty 1

## 2011-08-09 NOTE — Progress Notes (Signed)
Pt. Was very anxious to get home. The leg bag  teaching was done. The  Peripheral IV was removed intact.The JP drain was removed. The telemetry monitor was removed. Pt. was educated in leg bag care. The was discharged to the house by the use of a wheelchair to the front door of the hospital for discharge.

## 2011-08-09 NOTE — Discharge Summary (Deleted)
Date of admission: 08/08/2011  Date of discharge: 08/09/2011  Admission diagnosis: Prostate Cancer  Discharge diagnosis: Prostate Cancer  History and Physical: For full details, please see admission history and physical. Briefly, Tony [name] is a 70 y.o. gentleman with localized prostate cancer.  After discussing management/treatment options, he elected to proceed with surgical treatment.  Hospital Course: Tony [name] was taken to the operating room on 08/08/2011 and underwent a robotic assisted laparoscopic radical prostatectomy. He tolerated this procedure well and without complications. Postoperatively, he was able to be transferred to a regular hospital room following recovery from anesthesia.  He was able to begin ambulating the night of surgery. He remained hemodynamically stable overnight.  He had excellent urine output with appropriately minimal output from his pelvic drain and his pelvic drain was removed on POD #1.  He was transitioned to oral pain medication, tolerated a clear liquid diet, and had met all discharge criteria and was able to be discharged home later on POD#1.  Laboratory values:  Basename 08/09/11 0502 08/08/11 1100  HGB 12.4* 12.6*  HCT 34.2* 36.3*    Disposition: Home  Discharge instruction: He was instructed to be ambulatory but to refrain from heavy lifting, strenuous activity, or driving. He was instructed on urethral catheter care.  Discharge medications:   Medication List  As of 08/09/2011  6:28 PM   START taking these medications         ciprofloxacin 500 MG tablet   Commonly known as: CIPRO   Take 1 tablet (500 mg total) by mouth 2 (two) times daily. Start day prior to office visit for foley removal      HYDROcodone-acetaminophen 5-325 MG per tablet   Commonly known as: NORCO   Take 1-2 tablets by mouth every 6 (six) hours as needed (pain).         CONTINUE taking these medications         colchicine 0.6 MG tablet      metoprolol 50  MG tablet   Commonly known as: LOPRESSOR      niacin 1000 MG CR tablet   Commonly known as: NIASPAN      omeprazole 20 MG capsule   Commonly known as: PRILOSEC      pravastatin 40 MG tablet   Commonly known as: PRAVACHOL      ramipril 10 MG capsule   Commonly known as: ALTACE      triamterene-hydrochlorothiazide 37.5-25 MG per capsule   Commonly known as: DYAZIDE         STOP taking these medications         aspirin EC 81 MG tablet      fish oil-omega-3 fatty acids 1000 MG capsule      folic acid 800 MCG tablet      JALYN 0.5-0.4 MG Caps      multivitamin capsule          Where to get your medications    These are the prescriptions that you need to pick up.   You may get these medications from any pharmacy.         ciprofloxacin 500 MG tablet   HYDROcodone-acetaminophen 5-325 MG per tablet            Followup: He will followup in 1 week for catheter removal and to discuss his surgical pathology results.

## 2011-08-09 NOTE — Progress Notes (Signed)
  1 Day Post-Op Subjective: The patient is doing well.  No nausea or vomiting. Pain is adequately controlled.  Objective: Vital signs in last 24 hours: Temp:  [97.3 F (36.3 C)-98.4 F (36.9 C)] 98.3 F (36.8 C) (11/16 0542) Pulse Rate:  [54-85] 54  (11/16 0542) Resp:  [12-18] 18  (11/16 0542) BP: (104-141)/(45-75) 108/59 mmHg (11/16 0542) SpO2:  [96 %-100 %] 96 % (11/16 0542) Weight:  [94.802 kg (209 lb)] 209 lb (94.802 kg) (11/15 1500)  Intake/Output from previous day: 11/15 0701 - 11/16 0700 In: 6140 [P.O.:360; I.V.:4500; IV Piggyback:1000] Out: 1065 [Urine:600; Drains:265; Blood:200] Intake/Output this shift:    Physical Exam:  General: Alert and oriented. CV: RRR Lungs: Clear bilaterally. GI: Soft, Nondistended. Incisions: Dressings intact. Urine: Clear Extremities: Nontender, no erythema, no edema.  Lab Results:  Basename 08/09/11 0502 08/08/11 1100  HGB 12.4* 12.6*  HCT 34.2* 36.3*          Basename 08/09/11 0502 08/08/11 1100  CREATININE 1.04 1.02           Results for orders placed during the hospital encounter of 08/08/11 (from the past 24 hour(s))  BASIC METABOLIC PANEL     Status: Abnormal   Collection Time   08/08/11 11:00 AM      Component Value Range   Sodium 137  135 - 145 (mEq/L)   Potassium 3.3 (*) 3.5 - 5.1 (mEq/L)   Chloride 100  96 - 112 (mEq/L)   CO2 25  19 - 32 (mEq/L)   Glucose, Bld 137 (*) 70 - 99 (mg/dL)   BUN 9  6 - 23 (mg/dL)   Creatinine, Ser 4.13  0.50 - 1.35 (mg/dL)   Calcium 8.5  8.4 - 24.4 (mg/dL)   GFR calc non Af Amer 72 (*) >90 (mL/min)   GFR calc Af Amer 83 (*) >90 (mL/min)  HEMOGLOBIN AND HEMATOCRIT, BLOOD     Status: Abnormal   Collection Time   08/08/11 11:00 AM      Component Value Range   Hemoglobin 12.6 (*) 13.0 - 17.0 (g/dL)   HCT 01.0 (*) 27.2 - 52.0 (%)  BASIC METABOLIC PANEL     Status: Abnormal   Collection Time   08/09/11  5:02 AM      Component Value Range   Sodium 130 (*) 135 - 145 (mEq/L)   Potassium 3.8  3.5 - 5.1 (mEq/L)   Chloride 97  96 - 112 (mEq/L)   CO2 25  19 - 32 (mEq/L)   Glucose, Bld 176 (*) 70 - 99 (mg/dL)   BUN 10  6 - 23 (mg/dL)   Creatinine, Ser 5.36  0.50 - 1.35 (mg/dL)   Calcium 8.2 (*) 8.4 - 10.5 (mg/dL)   GFR calc non Af Amer 70 (*) >90 (mL/min)   GFR calc Af Amer 81 (*) >90 (mL/min)  HEMOGLOBIN AND HEMATOCRIT, BLOOD     Status: Abnormal   Collection Time   08/09/11  5:02 AM      Component Value Range   Hemoglobin 12.4 (*) 13.0 - 17.0 (g/dL)   HCT 64.4 (*) 03.4 - 52.0 (%)    Assessment/Plan: POD# 1 s/p robotic prostatectomy.  1) SL IVF 2) Ambulate, Incentive spirometry 3) Transition to oral pain medication 4) Dulcolax suppository 5) D/C pelvic drain pending outputs over last shift 6) Plan for likely discharge later today   Tony [name]. MD   LOS: 1 day   Mearl Olver,LES 08/09/2011, 7:55 AM

## 2011-12-02 ENCOUNTER — Other Ambulatory Visit: Payer: Self-pay | Admitting: Cardiology

## 2011-12-24 ENCOUNTER — Other Ambulatory Visit: Payer: Self-pay | Admitting: Cardiology

## 2011-12-29 ENCOUNTER — Other Ambulatory Visit: Payer: Self-pay | Admitting: Cardiology

## 2012-07-06 ENCOUNTER — Encounter (INDEPENDENT_AMBULATORY_CARE_PROVIDER_SITE_OTHER): Payer: Medicare Other | Admitting: Ophthalmology

## 2012-07-06 DIAGNOSIS — I10 Essential (primary) hypertension: Secondary | ICD-10-CM

## 2012-07-06 DIAGNOSIS — H35379 Puckering of macula, unspecified eye: Secondary | ICD-10-CM

## 2012-07-06 DIAGNOSIS — H35039 Hypertensive retinopathy, unspecified eye: Secondary | ICD-10-CM

## 2012-07-06 DIAGNOSIS — H43819 Vitreous degeneration, unspecified eye: Secondary | ICD-10-CM

## 2012-07-22 ENCOUNTER — Other Ambulatory Visit: Payer: Self-pay | Admitting: *Deleted

## 2012-07-22 DIAGNOSIS — I714 Abdominal aortic aneurysm, without rupture: Secondary | ICD-10-CM

## 2012-07-23 ENCOUNTER — Telehealth: Payer: Self-pay | Admitting: Internal Medicine

## 2012-07-23 NOTE — Telephone Encounter (Signed)
S/W pt in re NP appt 11/18 @ 1:30 w/Dr. Arbutus Ped.  Referrring Dr. Zachery Dauer Dx-Dec'd Plts   Welcome packet mailed.

## 2012-07-24 ENCOUNTER — Telehealth: Payer: Self-pay | Admitting: Internal Medicine

## 2012-07-24 HISTORY — PX: EYE SURGERY: SHX253

## 2012-07-24 NOTE — Telephone Encounter (Signed)
C/D 07/24/12 for appt. 08/10/12 °

## 2012-07-27 ENCOUNTER — Encounter: Payer: Self-pay | Admitting: Neurosurgery

## 2012-07-28 ENCOUNTER — Encounter: Payer: Self-pay | Admitting: Neurosurgery

## 2012-07-28 ENCOUNTER — Encounter (INDEPENDENT_AMBULATORY_CARE_PROVIDER_SITE_OTHER): Payer: Medicare Other | Admitting: *Deleted

## 2012-07-28 ENCOUNTER — Ambulatory Visit (INDEPENDENT_AMBULATORY_CARE_PROVIDER_SITE_OTHER): Payer: Medicare Other | Admitting: Neurosurgery

## 2012-07-28 VITALS — BP 141/69 | HR 51 | Resp 16 | Ht 69.5 in | Wt 195.0 lb

## 2012-07-28 DIAGNOSIS — I714 Abdominal aortic aneurysm, without rupture, unspecified: Secondary | ICD-10-CM | POA: Insufficient documentation

## 2012-07-28 DIAGNOSIS — R209 Unspecified disturbances of skin sensation: Secondary | ICD-10-CM

## 2012-07-28 DIAGNOSIS — R200 Anesthesia of skin: Secondary | ICD-10-CM | POA: Insufficient documentation

## 2012-07-28 NOTE — Progress Notes (Signed)
[facility] Office Note  CC: AAA surveillance Referring Physician: Early  History of Present Illness: 71 year old male patient of Dr. Arbie Cookey with a known small AAA. The patient denies any unusual back or abdominal pain recent medical diagnoses or new surgeries  Past Medical History  Diagnosis Date  . Ulcer   . Gout   . Heart disease   . Hyperlipidemia   . Hypertension   . Myocardial infarction     MI in 2000  . Prostate cancer Nov. 2012  . Peripheral vascular disease     ROS: [x]  Positive   [ ]  Denies    General: [ ]  Weight loss, [ ]  Fever, [ ]  chills Neurologic: [ ]  Dizziness, [ ]  Blackouts, [ ]  Seizure [ ]  Stroke, [ ]  "Mini stroke", [ ]  Slurred speech, [ ]  Temporary blindness; [ ]  weakness in arms or legs, [ ]  Hoarseness Cardiac: [ ]  Chest pain/pressure, [ ]  Shortness of breath at rest [ ]  Shortness of breath with exertion, [ ]  Atrial fibrillation or irregular heartbeat Vascular: [ ]  Pain in legs with walking, [ ]  Pain in legs at rest, [ ]  Pain in legs at night,  [ ]  Non-healing ulcer, [ ]  Blood clot in vein/DVT,   Pulmonary: [ ]  Home oxygen, [ ]  Productive cough, [ ]  Coughing up blood, [ ]  Asthma,  [ ]  Wheezing Musculoskeletal:  [ ]  Arthritis, [ ]  Low back pain, [ ]  Joint pain Hematologic: [ ]  Easy Bruising, [ ]  Anemia; [ ]  Hepatitis Gastrointestinal: [ ]  Blood in stool, [ ]  Gastroesophageal Reflux/heartburn, [ ]  Trouble swallowing Urinary: [ ]  chronic Kidney disease, [ ]  on HD - [ ]  MWF or [ ]  TTHS, [ ]  Burning with urination, [ ]  Difficulty urinating Skin: [ ]  Rashes, [ ]  Wounds Psychological: [ ]  Anxiety, [ ]  Depression   Social History History  Substance Use Topics  . Smoking status: Former Smoker -- 1.5 packs/day for 43 years    Types: Cigarettes    Quit date: 09/23/1998  . Smokeless tobacco: Never Used  . Alcohol Use: 8.4 oz/week    14 Glasses of wine per week    Family History Family History  Problem Relation Age  of Onset  . Diabetes Mother   . Heart disease Father   . Heart failure Father     Amputation- Leg  . Heart disease Brother     Heart Disease before age 17  . Heart disease Sister     No Known Allergies  Current Outpatient Prescriptions  Medication Sig Dispense Refill  . aspirin 81 MG tablet Take 81 mg by mouth daily.      . colchicine 0.6 MG tablet Take 0.6 mg by mouth daily as needed. gout      . fish oil-omega-3 fatty acids 1000 MG capsule Take 1,200 mg by mouth daily.      . folic acid (FOLVITE) 800 MCG tablet Take 400 mcg by mouth daily.      . metoprolol (LOPRESSOR) 50 MG tablet Take 50 mg by mouth 2 (two) times daily.       . Multiple Vitamins-Minerals (MULTIVITAMIN WITH MINERALS) tablet Take 1 tablet by mouth daily.      . niacin (NIASPAN) 1000 MG CR tablet Take 2,000 mg by mouth every evening.       . nitroGLYCERIN (NITROSTAT) 0.4 MG SL tablet Place 0.4 mg under the tongue every 5 (five) minutes as needed.      Marland Kitchen  ofloxacin (OCUFLOX) 0.3 % ophthalmic solution       . omeprazole (PRILOSEC) 20 MG capsule Take 20 mg by mouth daily.       . pravastatin (PRAVACHOL) 40 MG tablet Take 40 mg by mouth every evening.       . prednisoLONE acetate (PRED FORTE) 1 % ophthalmic suspension       . ramipril (ALTACE) 10 MG capsule Take 10 mg by mouth every morning.       . triamterene-hydrochlorothiazide (DYAZIDE) 37.5-25 MG per capsule Take 0.5 capsules by mouth every morning.       Marland Kitchen ketorolac (ACULAR) 0.4 % SOLN         Physical Examination  Filed Vitals:   07/28/12 1001  BP: 141/69  Pulse: 51  Resp: 16    Body mass index is 28.38 kg/(m^2).  General:  WDWN in NAD Gait: Normal HEENT: WNL Eyes: Pupils equal Pulmonary: normal non-labored breathing , without Rales, rhonchi,  wheezing Cardiac: RRR, without  Murmurs, rubs or gallops; No carotid bruits Abdomen: soft, NT, no masses Skin: no rashes, ulcers noted Vascular Exam/Pulses: 3+ radial pulses bilaterally palpable femoral  pulses bilaterally there is no abdominal mass palpated  Extremities without ischemic changes, no Gangrene , no cellulitis; no open wounds;  Musculoskeletal: no muscle wasting or atrophy  Neurologic: A&O X 3; Appropriate Affect ; SENSATION: normal; MOTOR FUNCTION:  moving all extremities equally. Speech is fluent/normal  Non-Invasive Vascular Imaging: AAA duplex shows a maximum diameter of 2.8 which is slightly less than 3.5 per CT last exam  ASSESSMENT/PLAN: Asymptomatic small AAA that'll followup in one year with repeat AAA duplex. The patient's questions were encouraged and answered, he is in agreement with this plan.  Lauree Chandler ANP  Clinic M.D.: Early

## 2012-07-28 NOTE — Addendum Note (Signed)
Addended by: Sharee Pimple on: 07/28/2012 11:26 AM   Modules accepted: Orders

## 2012-08-10 ENCOUNTER — Ambulatory Visit: Payer: Medicare Other

## 2012-08-10 ENCOUNTER — Other Ambulatory Visit (HOSPITAL_BASED_OUTPATIENT_CLINIC_OR_DEPARTMENT_OTHER): Payer: Medicare Other

## 2012-08-10 ENCOUNTER — Telehealth: Payer: Self-pay | Admitting: Internal Medicine

## 2012-08-10 ENCOUNTER — Ambulatory Visit (HOSPITAL_BASED_OUTPATIENT_CLINIC_OR_DEPARTMENT_OTHER): Payer: Medicare Other | Admitting: Internal Medicine

## 2012-08-10 ENCOUNTER — Encounter: Payer: Self-pay | Admitting: Internal Medicine

## 2012-08-10 VITALS — BP 133/60 | HR 60 | Temp 98.0°F | Resp 18 | Ht 68.0 in | Wt 204.5 lb

## 2012-08-10 DIAGNOSIS — D696 Thrombocytopenia, unspecified: Secondary | ICD-10-CM

## 2012-08-10 DIAGNOSIS — F1021 Alcohol dependence, in remission: Secondary | ICD-10-CM

## 2012-08-10 LAB — LACTATE DEHYDROGENASE (CC13): LDH: 273 U/L — ABNORMAL HIGH (ref 125–245)

## 2012-08-10 LAB — COMPREHENSIVE METABOLIC PANEL (CC13)
ALT: 55 U/L (ref 0–55)
Albumin: 3.2 g/dL — ABNORMAL LOW (ref 3.5–5.0)
Alkaline Phosphatase: 144 U/L (ref 40–150)
CO2: 29 mEq/L (ref 22–29)
Glucose: 112 mg/dl — ABNORMAL HIGH (ref 70–99)
Potassium: 4.1 mEq/L (ref 3.5–5.1)
Sodium: 139 mEq/L (ref 136–145)
Total Protein: 6.1 g/dL — ABNORMAL LOW (ref 6.4–8.3)

## 2012-08-10 LAB — CBC WITH DIFFERENTIAL/PLATELET
Basophils Absolute: 0 10*3/uL (ref 0.0–0.1)
Eosinophils Absolute: 0.1 10*3/uL (ref 0.0–0.5)
HGB: 15 g/dL (ref 13.0–17.1)
NEUT#: 2.1 10*3/uL (ref 1.5–6.5)
RDW: 13.5 % (ref 11.0–14.6)
WBC: 4.9 10*3/uL (ref 4.0–10.3)
lymph#: 2.3 10*3/uL (ref 0.9–3.3)

## 2012-08-10 NOTE — Telephone Encounter (Signed)
appts made and printed for pt aom °

## 2012-08-10 NOTE — Progress Notes (Signed)
Checked in new pt with no financial concerns. °

## 2012-08-10 NOTE — Progress Notes (Signed)
[facility] [phone]   [phone]  CONSULT NOTE  REASON FOR CONSULTATION:  71 years old white male with history of thrombocytopenia.  HPI Tony [name] is a 71 y.o. male was past medical history significant for hypertension, dyslipidemia, abdominal aortic aneurysm, COPD, gout, history of coronary artery disease status post myocardial infarction, status post coronary bypass surgery x4 vessels in 2000 as well as history of prostate cancer diagnosed in November 2012. The patient was seen recently by a new primary care physician Dr. Zachery Dauer for evaluation and repeat CBC on 07/13/2012 showed low platelets count of 92,000. On 07/31/2011 his platelets count was 107,000. On 12/30/2011 platelets count were 124,000, on 05/12/2012 platelets count went up to 136,000, than on 06/15/2012 lower down to 116,000. The patient is feeling fine with no specific complaints. He denied having any significant bleeding, bruises or ecchymosis. He has been on treatment with pravastatin since 2000. He is also on omeprazole for GERD. He also uses colchicine every now and then for gout flare. He does not use any over-the-counter medications except for baby aspirin. The patient still drinks alcohol on daily basis. He was referred to me today for evaluation of his thrombocytopenia. His family history is remarkable for heart disease. The patient is married and has one daughter. He was accompanied by his wife Clerance Lav. He is currently retired and used to work as Geophysical data processor. He has a history of smoking but quit in 2000. He continues to drink at least one half glass of wine on daily basis, no history of drug abuse.   @SFHPI @  Past Medical History  Diagnosis Date  . Ulcer   . Gout   . Heart disease   . Hyperlipidemia   . Hypertension   . Myocardial infarction     MI in 2000  . Prostate cancer Nov. 2012  . Peripheral vascular disease     Past Surgical History    Procedure Date  . Anal fissurectomy   . Coronary artery bypass graft     2000  . Shoulder surgery 1980'S  . Robot assisted laparoscopic radical prostatectomy 08/08/2011    Procedure: ROBOTIC ASSISTED LAPAROSCOPIC RADICAL PROSTATECTOMY LEVEL 2;  Surgeon: Crecencio Mc, MD;  Location: [facility];  Service: Urology;  Laterality: Bilateral;  with Bilateral Pelvic Lymphadenectomy   . Prostate surgery Nov. 2012  . Pr vein bypass graft,aorto-fem-pop 2000    Family History  Problem Relation Age of Onset  . Diabetes Mother   . Heart disease Father   . Heart failure Father     Amputation- Leg  . Heart disease Brother     Heart Disease before age 63  . Heart disease Sister     Social History History  Substance Use Topics  . Smoking status: Former Smoker -- 1.5 packs/day for 43 years    Types: Cigarettes    Quit date: 09/23/1998  . Smokeless tobacco: Never Used  . Alcohol Use: 8.4 oz/week    14 Glasses of wine per week    No Known Allergies  Current Outpatient Prescriptions  Medication Sig Dispense Refill  . aspirin 81 MG tablet Take 81 mg by mouth daily.      . fish oil-omega-3 fatty acids 1000 MG capsule Take 1,200 mg by mouth daily.      . folic acid (FOLVITE) 800 MCG tablet Take 400 mcg by mouth daily.      Marland Kitchen ketorolac (ACULAR) 0.4 % SOLN       .  metoprolol (LOPRESSOR) 50 MG tablet Take 50 mg by mouth 2 (two) times daily.       . Multiple Vitamins-Minerals (MULTIVITAMIN WITH MINERALS) tablet Take 1 tablet by mouth daily.      . niacin (NIASPAN) 1000 MG CR tablet Take 2,000 mg by mouth every evening.       Marland Kitchen ofloxacin (OCUFLOX) 0.3 % ophthalmic solution       . omeprazole (PRILOSEC) 20 MG capsule Take 20 mg by mouth daily.       . pravastatin (PRAVACHOL) 40 MG tablet Take 40 mg by mouth every evening.       . prednisoLONE acetate (PRED FORTE) 1 % ophthalmic suspension       . ramipril (ALTACE) 10 MG capsule Take 10 mg by mouth every morning.       . triamterene-hydrochlorothiazide  (DYAZIDE) 37.5-25 MG per capsule Take 0.5 capsules by mouth every morning.       . colchicine 0.6 MG tablet Take 0.6 mg by mouth daily as needed. gout      . nitroGLYCERIN (NITROSTAT) 0.4 MG SL tablet Place 0.4 mg under the tongue every 5 (five) minutes as needed.        Review of Systems  A comprehensive review of systems was negative.  Physical Exam  NWG:NFAOZ, healthy, no distress, well nourished and well developed SKIN: skin color, texture, turgor are normal HEAD: Normocephalic, No masses, lesions, tenderness or abnormalities EYES: normal, PERRLA EARS: External ears normal OROPHARYNX:no exudate and no erythema  NECK: supple, no adenopathy LYMPH:  no palpable lymphadenopathy, no hepatosplenomegaly LUNGS: clear to auscultation  HEART: regular rate & rhythm, no murmurs and no gallops ABDOMEN:abdomen soft, non-tender, normal bowel sounds and no masses or organomegaly BACK: Back symmetric, no curvature. EXTREMITIES:no joint deformities, effusion, or inflammation, no edema, no skin discoloration, no clubbing, no cyanosis  NEURO: alert & oriented x 3 with fluent speech, no focal motor/sensory deficits, gait normal  PERFORMANCE STATUS: ECOG 0  LABORATORY DATA: Lab Results  Component Value Date   WBC 4.9 08/10/2012   HGB 15.0 08/10/2012   HCT 43.4 08/10/2012   MCV 97.1 08/10/2012   PLT 96* 08/10/2012      Chemistry      Component Value Date/Time   NA 139 08/10/2012 1328   NA 130* 08/09/2011 0502   K 4.1 08/10/2012 1328   K 3.8 08/09/2011 0502   CL 105 08/10/2012 1328   CL 97 08/09/2011 0502   CO2 29 08/10/2012 1328   CO2 25 08/09/2011 0502   BUN 18.0 08/10/2012 1328   BUN 10 08/09/2011 0502   CREATININE 1.1 08/10/2012 1328   CREATININE 1.04 08/09/2011 0502      Component Value Date/Time   CALCIUM 9.0 08/10/2012 1328   CALCIUM 8.2* 08/09/2011 0502   ALKPHOS 144 08/10/2012 1328   AST 42* 08/10/2012 1328   ALT 55 08/10/2012 1328   BILITOT 0.91 08/10/2012 1328        RADIOGRAPHIC STUDIES: No results found.  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  PLAN: I have a lengthy discussion with the patient and his wife today about his condition. I ordered repeat CBC, comprehensive metabolic panel and LDH today. I recommended for the patient the following: 1)  observation for now with repeat CBC and LDH in 3 months. 2) decrease the amount of alcohol drinking to occasional 3) to discuss with his primary care physician reducing the dose of pravastatin to 20 mg by mouth daily instead of 40. 4) I advised the patient to call me immediately if he has any bleeding issues or platelets count less than 50,000. I gave the patient and his wife the time to ask questions and I answered them completely to their satisfaction.  All questions were answered. The patient knows to call the clinic with any problems, questions or concerns. We can certainly see the patient much sooner if necessary.  Thank you so much for allowing me to participate in the care of Tony [name]. I will continue to follow up the patient with you and assist in his care.  I spent 30 minutes counseling the patient face to face. The total time spent in the appointment was 50 minutes.  Onesti Bonfiglio K. 08/10/2012, 2:45 PM

## 2012-08-10 NOTE — Patient Instructions (Signed)
You have mild low platelets. We will continue on observation for now. Consider reducing the amount of alcohol drinking. Consider reducing the dose of pravastatin to 20 mg Followup in 3 months with repeat CBC

## 2012-10-24 HISTORY — PX: EYE SURGERY: SHX253

## 2012-11-09 ENCOUNTER — Ambulatory Visit (INDEPENDENT_AMBULATORY_CARE_PROVIDER_SITE_OTHER): Payer: Medicare Other | Admitting: Ophthalmology

## 2012-11-09 DIAGNOSIS — I10 Essential (primary) hypertension: Secondary | ICD-10-CM

## 2012-11-09 DIAGNOSIS — H35039 Hypertensive retinopathy, unspecified eye: Secondary | ICD-10-CM

## 2012-11-09 DIAGNOSIS — H43819 Vitreous degeneration, unspecified eye: Secondary | ICD-10-CM

## 2012-11-09 DIAGNOSIS — H35349 Macular cyst, hole, or pseudohole, unspecified eye: Secondary | ICD-10-CM

## 2012-11-09 DIAGNOSIS — H35379 Puckering of macula, unspecified eye: Secondary | ICD-10-CM

## 2012-11-11 ENCOUNTER — Ambulatory Visit (HOSPITAL_BASED_OUTPATIENT_CLINIC_OR_DEPARTMENT_OTHER): Payer: Medicare Other | Admitting: Internal Medicine

## 2012-11-11 ENCOUNTER — Telehealth: Payer: Self-pay | Admitting: Internal Medicine

## 2012-11-11 ENCOUNTER — Other Ambulatory Visit: Payer: Medicare Other | Admitting: Lab

## 2012-11-11 VITALS — BP 133/69 | HR 53 | Temp 97.1°F | Resp 20 | Ht 68.0 in | Wt 208.6 lb

## 2012-11-11 DIAGNOSIS — D696 Thrombocytopenia, unspecified: Secondary | ICD-10-CM

## 2012-11-11 LAB — CBC WITH DIFFERENTIAL/PLATELET
BASO%: 0.5 % (ref 0.0–2.0)
EOS%: 2.7 % (ref 0.0–7.0)
HCT: 44.7 % (ref 38.4–49.9)
MCH: 33.7 pg — ABNORMAL HIGH (ref 27.2–33.4)
MCHC: 33.9 g/dL (ref 32.0–36.0)
MONO#: 0.5 10*3/uL (ref 0.1–0.9)
RBC: 4.49 10*6/uL (ref 4.20–5.82)
RDW: 13.3 % (ref 11.0–14.6)
WBC: 5.1 10*3/uL (ref 4.0–10.3)
lymph#: 2.1 10*3/uL (ref 0.9–3.3)

## 2012-11-11 LAB — LACTATE DEHYDROGENASE (CC13): LDH: 260 U/L — ABNORMAL HIGH (ref 125–245)

## 2012-11-11 NOTE — Patient Instructions (Signed)
Platelets counts are still low but stable. Continue on observation with repeat CBC in 6 months.

## 2012-11-11 NOTE — Telephone Encounter (Signed)
gve the pt his aug 2014 appt calendar.

## 2012-11-11 NOTE — Progress Notes (Signed)
[facility] [phone]   [phone]  OFFICE PROGRESS NOTE  Gaye Alken, MD [address] 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  INTERVAL HISTORY: Tony [name] 72 y.o. male returns to the clinic today for followup visit accompanied his wife. The patient is feeling fine today with no specific complaints. He denied having any significant bleeding, bruises or ecchymosis. The patient denied having any significant weight loss or night sweats. He has no chest pain, shortness of breath, cough or hemoptysis. He has repeat CBC performed earlier today and he is here for evaluation and discussion of his lab results. He is currently on pravastatin 20 mg by mouth daily down from 40 mg previously.  MEDICAL HISTORY: Past Medical History  Diagnosis Date  . Ulcer   . Gout   . Heart disease   . Hyperlipidemia   . Hypertension   . Myocardial infarction     MI in 2000  . Prostate cancer Nov. 2012  . Peripheral vascular disease     ALLERGIES:  has No Known Allergies.  MEDICATIONS:  Current Outpatient Prescriptions  Medication Sig Dispense Refill  . aspirin 81 MG tablet Take 81 mg by mouth daily.      . cyanocobalamin 1000 MCG tablet Take 100 mcg by mouth daily.      . fish oil-omega-3 fatty acids 1000 MG capsule Take 1,200 mg by mouth daily.      . folic acid (FOLVITE) 800 MCG tablet Take 400 mcg by mouth daily.      Marland Kitchen ketorolac (ACULAR) 0.4 % SOLN       . metoprolol (LOPRESSOR) 50 MG tablet Take 50 mg by mouth 2 (two) times daily.       . Multiple Vitamins-Minerals (MULTIVITAMIN WITH MINERALS) tablet Take 1 tablet by mouth daily.      . niacin (NIASPAN) 1000 MG CR tablet Take 2,000 mg by mouth every evening.       Marland Kitchen ofloxacin (OCUFLOX) 0.3 % ophthalmic solution       . omeprazole (PRILOSEC) 20 MG capsule Take 20 mg by mouth  daily.       . pravastatin (PRAVACHOL) 40 MG tablet Take 20 mg by mouth every evening.       . prednisoLONE acetate (PRED FORTE) 1 % ophthalmic suspension       . ramipril (ALTACE) 10 MG capsule Take 10 mg by mouth every morning.       . triamterene-hydrochlorothiazide (DYAZIDE) 37.5-25 MG per capsule Take 0.5 capsules by mouth every morning.       . colchicine 0.6 MG tablet Take 0.6 mg by mouth daily as needed. gout      . nitroGLYCERIN (NITROSTAT) 0.4 MG SL tablet Place 0.4 mg under the tongue every 5 (five) minutes as needed.       No current facility-administered medications for this visit.    SURGICAL HISTORY:  Past Surgical History  Procedure Laterality Date  . Anal fissurectomy    . Coronary artery bypass graft      2000  . Shoulder surgery  1980'S  . Robot assisted laparoscopic radical prostatectomy  08/08/2011    Procedure: ROBOTIC ASSISTED LAPAROSCOPIC RADICAL PROSTATECTOMY LEVEL 2;  Surgeon: Crecencio Mc, MD;  Location: [facility];  Service: Urology;  Laterality: Bilateral;  with Bilateral Pelvic Lymphadenectomy   . Prostate surgery  Nov. 2012  . Pr vein bypass graft,aorto-fem-pop  2000    REVIEW OF SYSTEMS:  A comprehensive review of systems was negative.   PHYSICAL EXAMINATION: General appearance: alert, cooperative and no distress Head: Normocephalic, without obvious abnormality, atraumatic Neck: no adenopathy Resp: clear to auscultation bilaterally Cardio: regular rate and rhythm, S1, S2 normal, no murmur, click, rub or gallop GI: soft, non-tender; bowel sounds normal; no masses,  no organomegaly Extremities: extremities normal, atraumatic, no cyanosis or edema  ECOG PERFORMANCE STATUS: 0 - Asymptomatic  There were no vitals taken for this visit.  LABORATORY DATA: Lab Results  Component Value Date   WBC 5.1 11/11/2012   HGB 15.1 11/11/2012   HCT 44.7 11/11/2012   MCV 99.7* 11/11/2012   PLT 92* 11/11/2012      Chemistry      Component Value Date/Time   NA 139  08/10/2012 1328   NA 130* 08/09/2011 0502   K 4.1 08/10/2012 1328   K 3.8 08/09/2011 0502   CL 105 08/10/2012 1328   CL 97 08/09/2011 0502   CO2 29 08/10/2012 1328   CO2 25 08/09/2011 0502   BUN 18.0 08/10/2012 1328   BUN 10 08/09/2011 0502   CREATININE 1.1 08/10/2012 1328   CREATININE 1.04 08/09/2011 0502      Component Value Date/Time   CALCIUM 9.0 08/10/2012 1328   CALCIUM 8.2* 08/09/2011 0502   ALKPHOS 144 08/10/2012 1328   AST 42* 08/10/2012 1328   ALT 55 08/10/2012 1328   BILITOT 0.91 08/10/2012 1328       RADIOGRAPHIC STUDIES: No results found.  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 The patient is currently asymptomatic with no bleeding issues.  PLAN: I recommended for him continuous observation for now. I would not consider the patient for treatment unless his platelets count is less than 50,000 or he has any significant bleeding. I would see him back for follow up visit in 6 months with repeat CBC. He was advised to call me immediately if he has any concerning symptoms in the interval  All questions were answered. The patient knows to call the clinic with any problems, questions or concerns. We can certainly see the patient much sooner if necessary.

## 2013-03-10 ENCOUNTER — Ambulatory Visit (INDEPENDENT_AMBULATORY_CARE_PROVIDER_SITE_OTHER): Payer: Medicare Other | Admitting: Ophthalmology

## 2013-03-10 DIAGNOSIS — H35379 Puckering of macula, unspecified eye: Secondary | ICD-10-CM

## 2013-03-10 DIAGNOSIS — H43819 Vitreous degeneration, unspecified eye: Secondary | ICD-10-CM

## 2013-03-10 DIAGNOSIS — I10 Essential (primary) hypertension: Secondary | ICD-10-CM

## 2013-03-10 DIAGNOSIS — H35039 Hypertensive retinopathy, unspecified eye: Secondary | ICD-10-CM

## 2013-05-11 ENCOUNTER — Encounter: Payer: Self-pay | Admitting: Internal Medicine

## 2013-05-11 ENCOUNTER — Other Ambulatory Visit (HOSPITAL_BASED_OUTPATIENT_CLINIC_OR_DEPARTMENT_OTHER): Payer: Medicare Other | Admitting: Lab

## 2013-05-11 ENCOUNTER — Ambulatory Visit (HOSPITAL_BASED_OUTPATIENT_CLINIC_OR_DEPARTMENT_OTHER): Payer: Medicare Other | Admitting: Internal Medicine

## 2013-05-11 VITALS — BP 144/79 | HR 54 | Temp 98.2°F | Resp 20 | Ht 68.0 in | Wt 195.5 lb

## 2013-05-11 DIAGNOSIS — D696 Thrombocytopenia, unspecified: Secondary | ICD-10-CM

## 2013-05-11 LAB — CBC WITH DIFFERENTIAL/PLATELET
Basophils Absolute: 0.1 10*3/uL (ref 0.0–0.1)
Eosinophils Absolute: 0.1 10*3/uL (ref 0.0–0.5)
HGB: 14.7 g/dL (ref 13.0–17.1)
MCV: 100.7 fL — ABNORMAL HIGH (ref 79.3–98.0)
MONO#: 0.5 10*3/uL (ref 0.1–0.9)
MONO%: 8.9 % (ref 0.0–14.0)
NEUT#: 3.1 10*3/uL (ref 1.5–6.5)
RBC: 4.22 10*6/uL (ref 4.20–5.82)
RDW: 12.8 % (ref 11.0–14.6)
WBC: 5.9 10*3/uL (ref 4.0–10.3)
lymph#: 2.1 10*3/uL (ref 0.9–3.3)

## 2013-05-11 NOTE — Progress Notes (Signed)
[facility] [phone]   [phone]  OFFICE PROGRESS NOTE  Gaye Alken, MD [address] 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  INTERVAL HISTORY: Tony [name] 72 y.o. male returns to the clinic today for followup visit. The patient is feeling fine today with no specific complaints. He denied having any significant weight loss or night sweats. He denied having any fatigue or weakness. He has no bleeding, bruises or ecchymosis. The patient denied having any significant chest pain, shortness breath, cough or hemoptysis. He has repeat CBC performed earlier today and he is here for evaluation and discussion of his lab results.  MEDICAL HISTORY: Past Medical History  Diagnosis Date  . Ulcer   . Gout   . Heart disease   . Hyperlipidemia   . Hypertension   . Myocardial infarction     MI in 2000  . Prostate cancer Nov. 2012  . Peripheral vascular disease     ALLERGIES:  has No Known Allergies.  MEDICATIONS:  Current Outpatient Prescriptions  Medication Sig Dispense Refill  . aspirin 81 MG tablet Take 81 mg by mouth daily.      . colchicine 0.6 MG tablet Take 0.6 mg by mouth daily as needed. gout      . cyanocobalamin 1000 MCG tablet Take 100 mcg by mouth daily.      . fish oil-omega-3 fatty acids 1000 MG capsule Take 1,200 mg by mouth daily.      . folic acid (FOLVITE) 800 MCG tablet Take 400 mcg by mouth daily.      . metoprolol (LOPRESSOR) 50 MG tablet Take 50 mg by mouth 2 (two) times daily.       . Multiple Vitamins-Minerals (MULTIVITAMIN WITH MINERALS) tablet Take 1 tablet by mouth daily.      . niacin (NIASPAN) 1000 MG CR tablet Take 1,000 mg by mouth every evening.       Marland Kitchen omeprazole (PRILOSEC) 20 MG capsule Take 20 mg by mouth daily.       . pravastatin (PRAVACHOL) 40 MG tablet Take 10 mg by mouth  every evening.       . ramipril (ALTACE) 10 MG capsule Take 10 mg by mouth every morning.       . triamterene-hydrochlorothiazide (DYAZIDE) 37.5-25 MG per capsule Take 0.5 capsules by mouth every morning.        No current facility-administered medications for this visit.    SURGICAL HISTORY:  Past Surgical History  Procedure Laterality Date  . Anal fissurectomy    . Coronary artery bypass graft      2000  . Shoulder surgery  1980'S  . Robot assisted laparoscopic radical prostatectomy  08/08/2011    Procedure: ROBOTIC ASSISTED LAPAROSCOPIC RADICAL PROSTATECTOMY LEVEL 2;  Surgeon: Crecencio Mc, MD;  Location: [facility];  Service: Urology;  Laterality: Bilateral;  with Bilateral Pelvic Lymphadenectomy   . Prostate surgery  Nov. 2012  . Pr vein bypass graft,aorto-fem-pop  2000    REVIEW OF SYSTEMS:  A comprehensive review of systems was negative.   PHYSICAL EXAMINATION: General appearance: alert, cooperative and no distress Head: Normocephalic, without obvious abnormality, atraumatic Neck: no adenopathy Lymph nodes: Cervical, supraclavicular, and axillary nodes normal. Resp: clear to auscultation bilaterally Cardio: regular rate and rhythm, S1, S2 normal, no murmur, click, rub or gallop GI: soft, non-tender; bowel sounds normal; no masses,  no  organomegaly Extremities: extremities normal, atraumatic, no cyanosis or edema  ECOG PERFORMANCE STATUS: 0 - Asymptomatic  Blood pressure 144/79, pulse 54, temperature 98.2 F (36.8 C), temperature source Oral, resp. rate 20, height 5\' 8"  (1.727 m), weight 195 lb 8 oz (88.678 kg).  LABORATORY DATA: Lab Results  Component Value Date   WBC 5.9 05/11/2013   HGB 14.7 05/11/2013   HCT 42.5 05/11/2013   MCV 100.7* 05/11/2013   PLT 131* 05/11/2013      Chemistry      Component Value Date/Time   NA 139 08/10/2012 1328   NA 130* 08/09/2011 0502   K 4.1 08/10/2012 1328   K 3.8 08/09/2011 0502   CL 105 08/10/2012 1328   CL 97 08/09/2011 0502    CO2 29 08/10/2012 1328   CO2 25 08/09/2011 0502   BUN 18.0 08/10/2012 1328   BUN 10 08/09/2011 0502   CREATININE 1.1 08/10/2012 1328   CREATININE 1.04 08/09/2011 0502      Component Value Date/Time   CALCIUM 9.0 08/10/2012 1328   CALCIUM 8.2* 08/09/2011 0502   ALKPHOS 144 08/10/2012 1328   AST 42* 08/10/2012 1328   ALT 55 08/10/2012 1328   BILITOT 0.91 08/10/2012 1328       RADIOGRAPHIC STUDIES: No results found.  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 His CBC today showed improvement in his platelets count with a total number of 131,000. I recommended for the patient to continue on observation with routine followup visit with his primary care physician. I would see the patient on as-needed basis if his platelet count is less than 50,000 or the patient has any significant bleeding issues.  The patient voices understanding of current disease status and treatment options and is in agreement with the current care plan.  All questions were answered. The patient knows to call the clinic with any problems, questions or concerns. We can certainly see the patient much sooner if necessary.

## 2013-08-02 ENCOUNTER — Encounter: Payer: Self-pay | Admitting: Family

## 2013-08-03 ENCOUNTER — Encounter (INDEPENDENT_AMBULATORY_CARE_PROVIDER_SITE_OTHER): Payer: Self-pay

## 2013-08-03 ENCOUNTER — Ambulatory Visit (HOSPITAL_COMMUNITY)
Admission: RE | Admit: 2013-08-03 | Discharge: 2013-08-03 | Disposition: A | Discharge status: Home / Self Care | Payer: Medicare Other | Source: Ambulatory Visit | Attending: Family | Admitting: Family

## 2013-08-03 ENCOUNTER — Ambulatory Visit (INDEPENDENT_AMBULATORY_CARE_PROVIDER_SITE_OTHER): Payer: Medicare Other | Admitting: Family

## 2013-08-03 ENCOUNTER — Encounter: Payer: Self-pay | Admitting: Family

## 2013-08-03 VITALS — BP 130/86 | HR 56 | Resp 14 | Ht 68.5 in | Wt 197.0 lb

## 2013-08-03 DIAGNOSIS — I714 Abdominal aortic aneurysm, without rupture: Secondary | ICD-10-CM

## 2013-08-03 DIAGNOSIS — I745 Embolism and thrombosis of iliac artery: Secondary | ICD-10-CM | POA: Insufficient documentation

## 2013-08-03 DIAGNOSIS — I719 Aortic aneurysm of unspecified site, without rupture: Secondary | ICD-10-CM | POA: Insufficient documentation

## 2013-08-03 NOTE — Patient Instructions (Signed)

## 2013-08-03 NOTE — Progress Notes (Signed)
[facility] [place]  Established Abdominal Aortic Aneurysm  History of Present Illness  Tony [name] is a 72 y.o. (1941-02-03) male patient of Dr. Arbie Cookey with a known small AAA, first seen in this office in 2012. Small AAA found incidentally in a work up for his prostate cancer. Previous studies demonstrate an AAA, measuring 3.4 cm on 07/28/12.  The patient does not have back or abdominal pain.  The patient is not a smoker. The patient denies claudication in legs with walking. The patient denis history of stroke or TIA symptoms.  Pt Diabetic: No Pt smoker: former smoker, quit 2000  Past Medical History  Diagnosis Date  . Ulcer   . Gout   . Heart disease   . Hyperlipidemia   . Hypertension   . Myocardial infarction     MI in 2000  . Prostate cancer Nov. 2012  . Peripheral vascular disease    Past Surgical History  Procedure Laterality Date  . Anal fissurectomy    . Coronary artery bypass graft      2000  . Shoulder surgery  1980'S  . Robot assisted laparoscopic radical prostatectomy  08/08/2011    Procedure: ROBOTIC ASSISTED LAPAROSCOPIC RADICAL PROSTATECTOMY LEVEL 2;  Surgeon: Crecencio Mc, MD;  Location: [facility];  Service: Urology;  Laterality: Bilateral;  with Bilateral Pelvic Lymphadenectomy   . Prostate surgery  Nov. 2012  . Pr vein bypass graft,aorto-fem-pop  2000   Social History History   Social History  . Marital Status: Married    Spouse Name: N/A    Number of Children: N/A  . Years of Education: N/A   Occupational History  . Not on file.   Social History Main Topics  . Smoking status: Former Smoker -- 1.50 packs/day for 43 years    Types: Cigarettes    Quit date: 09/23/1998  . Smokeless tobacco: Never Used  . Alcohol Use: 8.4 oz/week    14 Glasses of wine per week  . Drug Use: No  . Sexual Activity: Not on file   Other Topics Concern  . Not on file   Social History Narrative  . No narrative on file   Family History Family  History  Problem Relation Age of Onset  . Diabetes Mother   . Heart disease Father   . Heart failure Father     Amputation- Leg  . Heart disease Brother     Heart Disease before age 75  . Heart disease Sister     Current Outpatient Prescriptions on File Prior to Visit  Medication Sig Dispense Refill  . aspirin 81 MG tablet Take 81 mg by mouth daily.      . colchicine 0.6 MG tablet Take 0.6 mg by mouth daily as needed. gout      . cyanocobalamin 1000 MCG tablet Take 100 mcg by mouth daily.      . fish oil-omega-3 fatty acids 1000 MG capsule Take 1,200 mg by mouth daily.      . folic acid (FOLVITE) 800 MCG tablet Take 400 mcg by mouth daily.      . metoprolol (LOPRESSOR) 50 MG tablet Take 50 mg by mouth 2 (two) times daily.       . Multiple Vitamins-Minerals (MULTIVITAMIN WITH MINERALS) tablet Take 1 tablet by mouth daily.      . niacin (NIASPAN) 1000 MG CR tablet Take 1,000 mg by mouth every evening.       Marland Kitchen omeprazole (PRILOSEC) 20 MG capsule Take 20  mg by mouth daily.       . pravastatin (PRAVACHOL) 40 MG tablet Take 10 mg by mouth every evening.       . ramipril (ALTACE) 10 MG capsule Take 10 mg by mouth every morning.       . triamterene-hydrochlorothiazide (DYAZIDE) 37.5-25 MG per capsule Take 0.5 capsules by mouth every morning.        No current facility-administered medications on file prior to visit.   No Known Allergies  ROS: [x]  Positive   [ ]  Negative   [ ]  All sytems reviewed and are negative  General: [ ]  Weight loss, [ ]  Fever, [ ]  chills Neurologic: [ ]  Dizziness, [ ]  Blackouts, [ ]  Seizure [ ]  Stroke, [ ]  "Mini stroke", [ ]  Slurred speech, [ ]  Temporary blindness; [ ]  weakness in arms or legs, [ ]  Hoarseness Cardiac: [ ]  Chest pain/pressure, [ ]  Shortness of breath at rest [ ]  Shortness of breath with exertion, [ ]  Atrial fibrillation or irregular heartbeat Vascular: [ ]  Pain in legs with walking, [ ]  Pain in legs at rest, [ ]  Pain in legs at night,  [ ]   Non-healing ulcer, [ ]  Blood clot in vein/DVT,   Pulmonary: [ ]  Home oxygen, [ ]  Productive cough, [ ]  Coughing up blood, [ ]  Asthma,  [ ]  Wheezing Musculoskeletal:  [ ]  Arthritis, [ ]  Low back pain, [ ]  Joint pain Hematologic: [ ]  Easy Bruising, [ ]  Anemia; [ ]  Hepatitis Gastrointestinal: [ ]  Blood in stool, [ ]  Gastroesophageal Reflux/heartburn, [ ]  Trouble swallowing Urinary: [ ]  chronic Kidney disease, [ ]  on HD - [ ]  MWF or [ ]  TTHS, [ ]  Burning with urination, [ ]  Difficulty urinating Skin: [ ]  Rashes, [ ]  Wounds Psychological: [ ]  Anxiety, [ ]  Depression  Physical Examination  Filed Vitals:   08/03/13 0927  BP: 130/86  Pulse: 56  Resp: 14   Filed Weights   08/03/13 0927  Weight: 197 lb (89.359 kg)   Body mass index is 29.51 kg/(m^2).  General: A&O x 3, WD.  Pulmonary: Sym exp, good air movt, CTAB, no rales, rhonchi, or wheezing.   Cardiac: RRR, Nl S1, S2, no Murmurs, rubs or gallops.  Carotid Bruits Left Right   Negative Negative   Aorta is not palpable. Radial pulses are 1+ and equal.                          VASCULAR EXAM:                                                                                                         LE Pulses LEFT RIGHT       FEMORAL  not palpable  not palpable        POPLITEAL  not palpable   not palpable       POSTERIOR TIBIAL  not palpable   not palpable        DORSALIS PEDIS      ANTERIOR TIBIAL not  palpable   not palpable      Gastrointestinal: soft, NTND, -G/R, - HSM, - masses, - CVAT B.  Musculoskeletal: M/S 5/5 throughout, Extremities without ischemic changes.   Neurologic: CN 2-12 intact, Pain and light touch intact in extremities, Motor exam as listed above.  Non-Invasive Vascular Imaging  AAA Duplex (08/03/2013)  Previous size: 3.4 cm (Date: 07/28/12)  Current size:  3.1 cm (Date: 08/03/2013)  Medical Decision Making  The patient is a 72 y.o. male who presents with asymptomatic small AAA with decrease in  size.   Based on this patient's exam and diagnostic studies, and on the surveillance timeline for this size AAA that is decreasing in size, the patient will follow up in 2 years  with the following studies: AAA Duplex.  The threshold for repair is AAA size > 5.5 cm, growth > 1 cm/yr, and symptomatic status.  I emphasized the importance of maximal medical management includig strict control of blood pressure, blood glucose, and lipid levels, antiplatelet agents, obtaining regular exercise, and cessation of smoking.   The patient was given information about AAA including signs, symptoms, treatment, and how to minimize the risk of enlargement and rupture of aneurysms.    The patient was advised to call 911 should the patient experience sudden onset abdominal or back pain.   Thank you for allowing [place] to participate in this patient's care.  Charisse March, RN, MSN, FNP-C [facility] [place] Office: [phone]  Clinic Physician: Early  08/03/2013, 9:05 AM

## 2013-08-03 NOTE — Addendum Note (Signed)
Addended by: Sharee Pimple on: 08/03/2013 11:51 AM   Modules accepted: Orders

## 2013-09-20 ENCOUNTER — Ambulatory Visit (INDEPENDENT_AMBULATORY_CARE_PROVIDER_SITE_OTHER): Payer: Medicare Other | Admitting: Ophthalmology

## 2013-09-20 DIAGNOSIS — H35379 Puckering of macula, unspecified eye: Secondary | ICD-10-CM

## 2013-09-20 DIAGNOSIS — H35039 Hypertensive retinopathy, unspecified eye: Secondary | ICD-10-CM

## 2013-09-20 DIAGNOSIS — H43819 Vitreous degeneration, unspecified eye: Secondary | ICD-10-CM

## 2013-09-20 DIAGNOSIS — H35349 Macular cyst, hole, or pseudohole, unspecified eye: Secondary | ICD-10-CM

## 2013-09-20 DIAGNOSIS — I10 Essential (primary) hypertension: Secondary | ICD-10-CM

## 2014-03-23 ENCOUNTER — Ambulatory Visit (INDEPENDENT_AMBULATORY_CARE_PROVIDER_SITE_OTHER): Payer: Medicare Other | Admitting: Ophthalmology

## 2014-03-23 DIAGNOSIS — H43819 Vitreous degeneration, unspecified eye: Secondary | ICD-10-CM

## 2014-03-23 DIAGNOSIS — H35369 Drusen (degenerative) of macula, unspecified eye: Secondary | ICD-10-CM

## 2014-03-23 DIAGNOSIS — H35039 Hypertensive retinopathy, unspecified eye: Secondary | ICD-10-CM

## 2014-03-23 DIAGNOSIS — H35379 Puckering of macula, unspecified eye: Secondary | ICD-10-CM

## 2014-03-23 DIAGNOSIS — I10 Essential (primary) hypertension: Secondary | ICD-10-CM

## 2014-11-28 ENCOUNTER — Other Ambulatory Visit: Payer: Self-pay

## 2015-02-01 ENCOUNTER — Other Ambulatory Visit: Payer: Self-pay | Admitting: Dermatology

## 2015-03-31 ENCOUNTER — Ambulatory Visit (INDEPENDENT_AMBULATORY_CARE_PROVIDER_SITE_OTHER): Payer: Medicare Other | Admitting: Ophthalmology

## 2015-03-31 DIAGNOSIS — I10 Essential (primary) hypertension: Secondary | ICD-10-CM

## 2015-03-31 DIAGNOSIS — H35033 Hypertensive retinopathy, bilateral: Secondary | ICD-10-CM

## 2015-03-31 DIAGNOSIS — H43813 Vitreous degeneration, bilateral: Secondary | ICD-10-CM | POA: Diagnosis not present

## 2015-03-31 DIAGNOSIS — H35373 Puckering of macula, bilateral: Secondary | ICD-10-CM | POA: Diagnosis not present

## 2015-08-03 ENCOUNTER — Encounter: Payer: Self-pay | Admitting: Family

## 2015-08-08 ENCOUNTER — Encounter: Payer: Self-pay | Admitting: Family

## 2015-08-08 ENCOUNTER — Ambulatory Visit (INDEPENDENT_AMBULATORY_CARE_PROVIDER_SITE_OTHER): Payer: Medicare Other | Admitting: Family

## 2015-08-08 ENCOUNTER — Ambulatory Visit (HOSPITAL_COMMUNITY)
Admission: RE | Admit: 2015-08-08 | Discharge: 2015-08-08 | Disposition: A | Discharge status: Home / Self Care | Payer: Medicare Other | Source: Ambulatory Visit | Attending: Family | Admitting: Family

## 2015-08-08 VITALS — BP 157/84 | HR 54 | Ht 68.5 in | Wt 211.0 lb

## 2015-08-08 DIAGNOSIS — I714 Abdominal aortic aneurysm, without rupture, unspecified: Secondary | ICD-10-CM

## 2015-08-08 NOTE — Addendum Note (Signed)
Addended by: Dorthula Rue L on: 08/08/2015 01:30 PM   Modules accepted: Orders

## 2015-08-08 NOTE — Progress Notes (Signed)
[facility] [place]  Established Abdominal Aortic Aneurysm  History of Present Illness  Tony [name] is a 74 y.o. (12/28/1940) male patient of Dr. Donnetta Hutching with a known small AAA, first seen in this office in 2012. Small AAA found incidentally in a work up for his prostate cancer. Previous studies demonstrate an AAA, measuring 3.4 cm on 07/28/12. The patient does not have back or abdominal pain. The patient is a former smoker. The patient denies claudication symtoms with walking. The patient denies any history of stroke or TIA symptoms. Pt states his systolic blood pressure usually runs 120's.  Pt Diabetic: No Pt smoker: former smoker, quit 2000, quit when he had an MI   Past Medical History  Diagnosis Date  . Ulcer   . Gout   . Heart disease   . Hyperlipidemia   . Hypertension   . Myocardial infarction ([place])     MI in 2000  . Prostate cancer Nov. 2012  . Peripheral vascular disease   . CAD (coronary artery disease)    Past Surgical History  Procedure Laterality Date  . Anal fissurectomy    . Coronary artery bypass graft      2000  . Shoulder surgery  1980'S  . Robot assisted laparoscopic radical prostatectomy  08/08/2011    Procedure: ROBOTIC ASSISTED LAPAROSCOPIC RADICAL PROSTATECTOMY LEVEL 2;  Surgeon: Dutch Gray, MD;  Location: [facility];  Service: Urology;  Laterality: Bilateral;  with Bilateral Pelvic Lymphadenectomy   . Prostate surgery  Nov. 2012  . Pr vein bypass graft,aorto-fem-pop  2000  . Eye surgery Left Nov. 2013    Cataract  . Eye surgery Right Feb. 2014    Cataract   Social History Social History   Social History  . Marital Status: Married    Spouse Name: N/A  . Number of Children: N/A  . Years of Education: N/A   Occupational History  . Not on file.   Social History Main Topics  . Smoking status: Former Smoker -- 1.50 packs/day for 43 years    Types: Cigarettes    Quit date: 09/23/1998  . Smokeless tobacco: Never Used  .  Alcohol Use: 8.4 oz/week    14 Glasses of wine per week  . Drug Use: No  . Sexual Activity: Not on file   Other Topics Concern  . Not on file   Social History Narrative  . No narrative on file   Family History Family History  Problem Relation Age of Onset  . Diabetes Mother   . Heart disease Mother     CHF  . Heart disease Father   . Heart failure Father     Amputation- Leg  . Deep vein thrombosis Father   . Heart disease Brother     Heart Disease before age 3  . Heart failure Brother   . Hyperlipidemia Brother   . Hypertension Brother   . Heart disease Sister   . Diabetes Sister   . Heart failure Sister     Current Outpatient Prescriptions on File Prior to Visit  Medication Sig Dispense Refill  . aspirin 81 MG tablet Take 81 mg by mouth daily.    . colchicine 0.6 MG tablet Take 0.6 mg by mouth daily as needed. gout    . cyanocobalamin 1000 MCG tablet Take 100 mcg by mouth daily.    . fish oil-omega-3 fatty acids 1000 MG capsule Take 1,200 mg by mouth daily.    . folic acid (FOLVITE) 338  MCG tablet Take 400 mcg by mouth daily.    . metoprolol (LOPRESSOR) 50 MG tablet Take 50 mg by mouth 2 (two) times daily.     . Multiple Vitamins-Minerals (MULTIVITAMIN WITH MINERALS) tablet Take 1 tablet by mouth daily.    . niacin (NIASPAN) 1000 MG CR tablet Take 500 mg by mouth 2 (two) times daily.     Marland Kitchen omeprazole (PRILOSEC) 20 MG capsule Take 20 mg by mouth daily.     . pravastatin (PRAVACHOL) 40 MG tablet Take 10 mg by mouth every evening.     . ramipril (ALTACE) 10 MG capsule Take 10 mg by mouth every morning.     . triamterene-hydrochlorothiazide (DYAZIDE) 37.5-25 MG per capsule Take 0.5 capsules by mouth every morning.      No current facility-administered medications on file prior to visit.   No Known Allergies  ROS: See HPI for pertinent positives and negatives.  Physical Examination  Filed Vitals:   08/08/15 0840 08/08/15 0844  BP: 153/79 157/84  Pulse: 54    Height: 5' 8.5" (1.74 m)   Weight: 211 lb (95.709 kg)   SpO2: 97%    Body mass index is 31.61 kg/(m^2).   General: A&O x 3, WD, obese male  Pulmonary: Sym exp, good air movt, CTAB, no rales, rhonchi, or wheezing.   Cardiac: RRR, Nl S1, S2, no detected murmur.  Carotid Bruits Left Right   Negative Negative  Aorta is not palpable. Radial pulses are 1+ and equal.   VASCULAR EXAM:     LE Pulses LEFT RIGHT   FEMORAL not palpable not palpable    POPLITEAL not palpable  not palpable   POSTERIOR TIBIAL 1+ palpable  1+ palpable    DORSALIS PEDIS  ANTERIOR TIBIAL faintly palpable  faintly palpable      Gastrointestinal: soft, NTND, -G/R, - HSM, - palpable masses, - CVAT B.  Musculoskeletal: M/S 5/5 throughout, extremities without ischemic changes.   Neurologic: CN 2-12 intact, Pain and light touch intact in extremities, Motor exam as listed above.          Non-Invasive Vascular Imaging  AAA Duplex (08/08/2015) ABDOMINAL AORTA DUPLEX EVALUATION    INDICATION: Abdominal Aortic Aneurysm    PREVIOUS INTERVENTION(S): NA    DUPLEX EXAM:     LOCATION DIAMETER AP (cm) DIAMETER TRANSVERSE (cm) VELOCITIES (cm/sec)  Aorta Proximal 2.07 2.17 55  Aorta Mid 2.49 2.39 42  Aorta Distal 3.35 3.51 45  Right Common Iliac Artery 0.83 0.96 106  Left Common Iliac Artery 1.34 1.63 127    Previous max aortic diameter:  3.0 x 3.1 Date: 08/03/2013     ADDITIONAL FINDINGS:     IMPRESSION: Abdominal aortic aneurysm present measuring 3.35cm AP x 3.51cm TRV, no intramural thrombus present.    Compared to the previous exam:  Minimal increase in diameter since previous study on 08/03/2013.    Medical Decision Making  The patient is a 74 y.o. male who presents with asymptomatic  AAA with a slight increase in size in 2 years, from 3.1 to 3.51 cm.   Based on this patient's exam and diagnostic studies, the patient will follow up in 1 year  with the following studies: AAA duplex.  Consideration for repair of AAA would be made when the size is 5.5 cm, growth > 1 cm/yr, and symptomatic status.  I emphasized the importance of maximal medical management including strict control of blood pressure, blood glucose, and lipid levels, antiplatelet agents, obtaining regular exercise, and continued  cessation of smoking.   The patient was given information about AAA including signs, symptoms, treatment, and how to minimize the risk of enlargement and rupture of aneurysms.    The patient was advised to call 911 should the patient experience sudden onset abdominal or back pain.   Thank you for allowing [place] to participate in this patient's care.  Clemon Chambers, RN, MSN, FNP-C [facility] [place] Office: [phone]  Clinic Physician: Early  08/08/2015, 8:37 AM

## 2015-08-08 NOTE — Patient Instructions (Signed)
Abdominal Aortic Aneurysm An aneurysm is a weakened or damaged part of an artery wall that bulges from the normal force of blood pumping through the body. An abdominal aortic aneurysm is an aneurysm that occurs in the lower part of the aorta, the main artery of the body.  The major concern with an abdominal aortic aneurysm is that it can enlarge and burst (rupture) or blood can flow between the layers of the wall of the aorta through a tear (aorticdissection). Both of these conditions can cause bleeding inside the body and can be life threatening unless diagnosed and treated promptly. CAUSES  The exact cause of an abdominal aortic aneurysm is unknown. Some contributing factors are:   A hardening of the arteries caused by the buildup of fat and other substances in the lining of a blood vessel (arteriosclerosis).  Inflammation of the walls of an artery (arteritis).   Connective tissue diseases, such as Marfan syndrome.   Abdominal trauma.   An infection, such as syphilis or staphylococcus, in the wall of the aorta (infectious aortitis) caused by bacteria. RISK FACTORS  Risk factors that contribute to an abdominal aortic aneurysm may include:  Age older than 60 years.   High blood pressure (hypertension).  Male gender.  Ethnicity (white race).  Obesity.  Family history of aneurysm (first degree relatives only).  Tobacco use. PREVENTION  The following healthy lifestyle habits may help decrease your risk of abdominal aortic aneurysm:  Quitting smoking. Smoking can raise your blood pressure and cause arteriosclerosis.  Limiting or avoiding alcohol.  Keeping your blood pressure, blood sugar level, and cholesterol levels within normal limits.  Decreasing your salt intake. In somepeople, too much salt can raise blood pressure and increase your risk of abdominal aortic aneurysm.  Eating a diet low in saturated fats and cholesterol.  Increasing your fiber intake by including  whole grains, vegetables, and fruits in your diet. Eating these foods may help lower blood pressure.  Maintaining a healthy weight.  Staying physically active and exercising regularly. SYMPTOMS  The symptoms of abdominal aortic aneurysm may vary depending on the size and rate of growth of the aneurysm.Most grow slowly and do not have any symptoms. When symptoms do occur, they may include:  Pain (abdomen, side, lower back, or groin). The pain may vary in intensity. A sudden onset of severe pain may indicate that the aneurysm has ruptured.  Feeling full after eating only small amounts of food.  Nausea or vomiting or both.  Feeling a pulsating lump in the abdomen.  Feeling faint or passing out. DIAGNOSIS  Since most unruptured abdominal aortic aneurysms have no symptoms, they are often discovered during diagnostic exams for other conditions. An aneurysm may be found during the following procedures:  Ultrasonography (A one-time screening for abdominal aortic aneurysm by ultrasonography is also recommended for all men aged 65-75 years who have ever smoked).  X-ray exams.  A computed tomography (CT).  Magnetic resonance imaging (MRI).  Angiography or arteriography. TREATMENT  Treatment of an abdominal aortic aneurysm depends on the size of your aneurysm, your age, and risk factors for rupture. Medication to control blood pressure and pain may be used to manage aneurysms smaller than 6 cm. Regular monitoring for enlargement may be recommended by your caregiver if:  The aneurysm is 3-4 cm in size (an annual ultrasonography may be recommended).  The aneurysm is 4-4.5 cm in size (an ultrasonography every 6 months may be recommended).  The aneurysm is larger than 4.5 cm in   size (your caregiver may ask that you be examined by a vascular surgeon). If your aneurysm is larger than 6 cm, surgical repair may be recommended. There are two main methods for repair of an aneurysm:   Endovascular  repair (a minimally invasive surgery). This is done most often.  Open repair. This method is used if an endovascular repair is not possible.   This information is not intended to replace advice given to you by your health care provider. Make sure you discuss any questions you have with your health care provider.   Document Released: 06/19/2005 Document Revised: 01/04/2013 Document Reviewed: 10/09/2012 Elsevier Interactive Patient Education 2016 [employer]  

## 2015-09-05 ENCOUNTER — Other Ambulatory Visit: Payer: Self-pay | Admitting: Dermatology

## 2016-02-06 ENCOUNTER — Other Ambulatory Visit: Payer: Self-pay | Admitting: Dermatology

## 2016-04-02 ENCOUNTER — Ambulatory Visit (INDEPENDENT_AMBULATORY_CARE_PROVIDER_SITE_OTHER): Payer: Medicare Other | Admitting: Ophthalmology

## 2016-04-02 DIAGNOSIS — I10 Essential (primary) hypertension: Secondary | ICD-10-CM

## 2016-04-02 DIAGNOSIS — H35373 Puckering of macula, bilateral: Secondary | ICD-10-CM

## 2016-04-02 DIAGNOSIS — H35033 Hypertensive retinopathy, bilateral: Secondary | ICD-10-CM

## 2016-04-02 DIAGNOSIS — H43813 Vitreous degeneration, bilateral: Secondary | ICD-10-CM | POA: Diagnosis not present

## 2016-08-07 ENCOUNTER — Encounter: Payer: Self-pay | Admitting: Family

## 2016-08-13 ENCOUNTER — Ambulatory Visit (INDEPENDENT_AMBULATORY_CARE_PROVIDER_SITE_OTHER): Payer: Medicare Other | Admitting: Family

## 2016-08-13 ENCOUNTER — Ambulatory Visit (HOSPITAL_COMMUNITY)
Admission: RE | Admit: 2016-08-13 | Discharge: 2016-08-13 | Disposition: A | Discharge status: Home / Self Care | Payer: Medicare Other | Source: Ambulatory Visit | Attending: Family | Admitting: Family

## 2016-08-13 ENCOUNTER — Encounter: Payer: Self-pay | Admitting: Family

## 2016-08-13 VITALS — BP 157/83 | HR 56 | Temp 98.0°F | Ht 69.5 in | Wt 200.0 lb

## 2016-08-13 DIAGNOSIS — Z87891 Personal history of nicotine dependence: Secondary | ICD-10-CM | POA: Diagnosis not present

## 2016-08-13 DIAGNOSIS — I714 Abdominal aortic aneurysm, without rupture, unspecified: Secondary | ICD-10-CM

## 2016-08-13 NOTE — Progress Notes (Signed)
[facility] [place]   CC: Follow up Abdominal Aortic Aneurysm  History of Present Illness  Tony [name] is a 75 y.o. (17-Mar-1941) male patient of Dr. Donnetta Hutching with a known small AAA, first seen in this office in 2012. Small AAA found incidentally in a work up for his prostate cancer. Previous studies demonstrate an AAA, measuring 3.4 cm on 07/28/12. The patient does not have back or abdominal pain. The patient is a former smoker. The patient denies claudication symtoms with walking. The patient denies any history of stroke or TIA symptoms. Pt states his systolic blood pressure usually runs 120's.  He reports constipation issues in the last 2 months.   Pt Diabetic: No Pt smoker: former smoker, quit 2000, quit when he had an MI, had a 4 vessel CABG   Past Medical History:  Diagnosis Date  . CAD (coronary artery disease)   . Gout   . Heart disease   . Hyperlipidemia   . Hypertension   . Myocardial infarction    MI in 2000  . Peripheral vascular disease ([place])   . Prostate cancer [facility]) Nov. 2012  . Ulcer [facility])    Past Surgical History:  Procedure Laterality Date  . ANAL FISSURECTOMY    . CORONARY ARTERY BYPASS GRAFT     2000  . EYE SURGERY Left Nov. 2013   Cataract  . EYE SURGERY Right Feb. 2014   Cataract  . PR VEIN BYPASS GRAFT,AORTO-FEM-POP  2000  . PROSTATE SURGERY  Nov. 2012  . ROBOT ASSISTED LAPAROSCOPIC RADICAL PROSTATECTOMY  08/08/2011   Procedure: ROBOTIC ASSISTED LAPAROSCOPIC RADICAL PROSTATECTOMY LEVEL 2;  Surgeon: Dutch Gray, MD;  Location: [facility];  Service: Urology;  Laterality: Bilateral;  with Bilateral Pelvic Lymphadenectomy   . SHOULDER SURGERY  1980'S   Social History Social History   Social History  . Marital status: Married    Spouse name: N/A  . Number of children: N/A  . Years of education: N/A   Occupational History  . Not on file.   Social History Main Topics  . Smoking status: Former Smoker    Packs/day: 1.50   Years: 43.00    Types: Cigarettes    Quit date: 09/23/1998  . Smokeless tobacco: Never Used  . Alcohol use 8.4 oz/week    14 Glasses of wine per week  . Drug use: No  . Sexual activity: Not on file   Other Topics Concern  . Not on file   Social History Narrative  . No narrative on file   Family History Family History  Problem Relation Age of Onset  . Diabetes Mother   . Heart disease Mother     CHF  . Heart disease Father   . Heart failure Father     Amputation- Leg  . Deep vein thrombosis Father   . Heart disease Brother     Heart Disease before age 68  . Heart failure Brother   . Hyperlipidemia Brother   . Hypertension Brother   . Heart disease Sister   . Diabetes Sister   . Heart failure Sister     Current Outpatient Prescriptions on File Prior to Visit  Medication Sig Dispense Refill  . aspirin 81 MG tablet Take 81 mg by mouth daily.    . colchicine 0.6 MG tablet Take 0.6 mg by mouth daily as needed. gout    . cyanocobalamin 1000 MCG tablet Take 100 mcg by mouth daily.    . fish oil-omega-3 fatty  acids 1000 MG capsule Take 1,200 mg by mouth daily.    . folic acid (FOLVITE) 638 MCG tablet Take 400 mcg by mouth daily.    . metoprolol (LOPRESSOR) 50 MG tablet Take 50 mg by mouth 2 (two) times daily.     . Multiple Vitamins-Minerals (MULTIVITAMIN WITH MINERALS) tablet Take 1 tablet by mouth daily.    . niacin (NIASPAN) 1000 MG CR tablet Take 500 mg by mouth 2 (two) times daily.     Marland Kitchen omeprazole (PRILOSEC) 20 MG capsule Take 20 mg by mouth daily.     . pravastatin (PRAVACHOL) 40 MG tablet Take 10 mg by mouth every evening.     . ramipril (ALTACE) 10 MG capsule Take 10 mg by mouth every morning.     . triamterene-hydrochlorothiazide (DYAZIDE) 37.5-25 MG per capsule Take 0.5 capsules by mouth every morning.      No current facility-administered medications on file prior to visit.    No Known Allergies  ROS: See HPI for pertinent positives and negatives.  Physical  Examination  Vitals:   08/13/16 0839 08/13/16 0842  BP: (!) 156/76 (!) 157/83  Pulse: (!) 56   Temp: 98 F (36.7 C)   Weight: 200 lb (90.7 kg)   Height: 5' 9.5" (1.765 m)    Body mass index is 29.11 kg/m.  General: A&O x 3, WD, obese male  Pulmonary: Sym exp, respirations are non labored, good air movt, CTAB, no rales, rhonchi, or wheezing.   Cardiac: RRR, Nl S1, S2, no detected murmur.  Carotid Bruits Left Right   Negative Negative  Aorta is not palpable. Radial pulses are 1+ and equal.   VASCULAR EXAM:     LE Pulses LEFT RIGHT   FEMORAL not palpable not palpable    POPLITEAL not palpable  not palpable   POSTERIOR TIBIAL faintly palpable  faintly palpable    DORSALIS PEDIS  ANTERIOR TIBIAL not palpable  not palpable      Gastrointestinal: soft, NTND, -G/R, - HSM, right of center reducible asymptomatic abdominal hernia, - CVAT B.  Musculoskeletal: M/S 5/5 throughout, extremities without ischemic changes.   Neurologic: CN 2-12 intact, Pain and light touch intact in extremities, Motor exam as listed above.   Non-Invasive Vascular Imaging  AAA Duplex (08/13/2016)  Previous size: 3.51 cm (Date: 08-08-15)  Current size:  3.61 cm (Date: 08-13-16), Limited visualization of the abdominal vasculature; iliac arteries not visualized due to overlying bowel gas and pt body habitus   Medical Decision Making  The patient is a 75 y.o. male who presents with asymptomatic AAA with no significant increase in size, from 3.51 cm to 3.61 cm in a year, based on limited visualization.   Based on this patient's exam and diagnostic studies, the patient will follow up in 1 year  with the following studies: AAA duplex.  Consideration for repair of AAA would be made when  the size is 5.0 cm, growth > 1 cm/yr, and symptomatic status.  I emphasized the importance of maximal medical management including strict control of blood pressure, blood glucose, and lipid levels, antiplatelet agents, obtaining regular exercise, and continued cessation of smoking.   The patient was given information about AAA including signs, symptoms, treatment, and how to minimize the risk of enlargement and rupture of aneurysms.    The patient was advised to call 911 should the patient experience sudden onset abdominal or back pain.   Thank you for allowing [place] to participate in this patient's care.  Vinnie Level  Nickel, RN, MSN, FNP-C [facility] [place] Office: [phone]  Clinic Physician: Early  08/13/2016, 8:49 AM

## 2016-08-13 NOTE — Patient Instructions (Addendum)
Abdominal Aortic Aneurysm Blood pumps away from the heart through tubes (blood vessels) called arteries. Aneurysms are weak or damaged places in the wall of an artery. It bulges out like a balloon. An abdominal aortic aneurysm happens in the main artery of the body (aorta). It can burst or tear, causing bleeding inside the body. This is an emergency. It needs treatment right away. What are the causes? The exact cause is unknown. Things that could cause this problem include:  Fat and other substances building up in the lining of a tube.  Swelling of the walls of a blood vessel.  Certain tissue diseases.  Belly (abdominal) trauma.  An infection in the main artery of the body. What increases the risk? There are things that make it more likely for you to have an aneurysm. These include:  Being over the age of 76 years old.  Having high blood pressure (hypertension).  Being a male.  Being white.  Being very overweight (obese).  Having a family history of aneurysm.  Using tobacco products. What are the signs or symptoms? Symptoms depend on the size of the aneurysm and how fast it grows. There may not be symptoms. If symptoms occur, they can include:  Pain (belly, side, lower back, or groin).  Feeling full after eating a small amount of food.  Feeling sick to your stomach (nauseous), throwing up (vomiting), or both.  Feeling a lump in your belly that feels like it is beating (pulsating).  Feeling like you will pass out (faint). How is this treated?  Medicine to control blood pressure and pain.  Imaging tests to see if the aneurysm gets bigger.  Surgery. How is this prevented? To lessen your chance of getting this condition:  Stop smoking. Stop chewing tobacco.  Limit or avoid alcohol.  Keep your blood pressure, blood sugar, and cholesterol within normal limits.  Eat less salt.  Eat foods low in saturated fats and cholesterol. These are found in animal and whole  dairy products.  Eat more fiber. Fiber is found in whole grains, vegetables, and fruits.  Keep a healthy weight.  Stay active and exercise often. This information is not intended to replace advice given to you by your health care provider. Make sure you discuss any questions you have with your health care provider. Document Released: 01/04/2013 Document Revised: 02/15/2016 Document Reviewed: 10/09/2012 Elsevier Interactive Patient Education  2017 Elsevier Inc.      Before your next abdominal ultrasound:  Take two Extra-Strength Gas-X capsules at bedtime the night before the test. Take another two Extra-Strength Gas-X capsules 3 hours before the test.   

## 2016-08-14 NOTE — Addendum Note (Signed)
Addended by: Lianne Cure A on: 08/14/2016 01:45 PM   Modules accepted: Orders

## 2017-04-03 ENCOUNTER — Ambulatory Visit (INDEPENDENT_AMBULATORY_CARE_PROVIDER_SITE_OTHER): Payer: Medicare Other | Admitting: Ophthalmology

## 2017-04-03 DIAGNOSIS — H43813 Vitreous degeneration, bilateral: Secondary | ICD-10-CM | POA: Diagnosis not present

## 2017-04-03 DIAGNOSIS — H35373 Puckering of macula, bilateral: Secondary | ICD-10-CM | POA: Diagnosis not present

## 2017-04-03 DIAGNOSIS — I10 Essential (primary) hypertension: Secondary | ICD-10-CM

## 2017-04-03 DIAGNOSIS — H35033 Hypertensive retinopathy, bilateral: Secondary | ICD-10-CM

## 2017-08-15 ENCOUNTER — Ambulatory Visit: Payer: Medicare Other | Admitting: Family

## 2017-08-15 ENCOUNTER — Other Ambulatory Visit (HOSPITAL_COMMUNITY): Payer: Medicare Other

## 2017-08-19 ENCOUNTER — Ambulatory Visit (HOSPITAL_COMMUNITY)
Admission: RE | Admit: 2017-08-19 | Discharge: 2017-08-19 | Disposition: A | Discharge status: Home / Self Care | Payer: Medicare Other | Source: Ambulatory Visit | Attending: Family | Admitting: Family

## 2017-08-19 ENCOUNTER — Encounter: Payer: Self-pay | Admitting: Family

## 2017-08-19 ENCOUNTER — Ambulatory Visit (INDEPENDENT_AMBULATORY_CARE_PROVIDER_SITE_OTHER): Payer: Medicare Other | Admitting: Family

## 2017-08-19 VITALS — BP 132/70 | HR 50 | Temp 97.1°F | Resp 17

## 2017-08-19 DIAGNOSIS — Z87891 Personal history of nicotine dependence: Secondary | ICD-10-CM | POA: Diagnosis not present

## 2017-08-19 DIAGNOSIS — R0989 Other specified symptoms and signs involving the circulatory and respiratory systems: Secondary | ICD-10-CM | POA: Diagnosis not present

## 2017-08-19 DIAGNOSIS — I714 Abdominal aortic aneurysm, without rupture, unspecified: Secondary | ICD-10-CM

## 2017-08-19 NOTE — Progress Notes (Signed)
[facility] [place]   CC: Follow up Abdominal Aortic Aneurysm  History of Present Illness  Tony [name] is a 76 y.o. (05-Jul-1941) male patient of Dr. Donnetta Hutching with a known small AAA, first seen in this office in 2012. Small AAA found incidentally in a work up for his prostate cancer. Previous studies demonstrate an AAA, measuring 3.4 cm on 07/28/12. The patient does not have back or abdominal pain. The patient is a former smoker.  The patient denies any history of stroke or TIA symptoms.  He reports numbness in the tip of his left index finger, has had this for some time. He denies pain or other symptoms in either upper extremity.   He can walk about a mile before his calves start to feel weak, but he admits not walking much, but he swims in an outside pool in the Summer daily.  He reports constipation for over a year; he states he eats a high fiber diet, has tried stool softeners, he will schedule an appointment with a gastroenterologist.   Pt Diabetic: No Pt smoker: former smoker, quit 2000, quit when he had an MI, had a 4 vessel CABG    Past Medical History:  Diagnosis Date  . CAD (coronary artery disease)   . Gout   . Heart disease   . Hyperlipidemia   . Hypertension   . Myocardial infarction ([place])    MI in 2000  . Peripheral vascular disease ([place])   . Prostate cancer [facility]) Nov. 2012  . Ulcer    Past Surgical History:  Procedure Laterality Date  . ANAL FISSURECTOMY    . CORONARY ARTERY BYPASS GRAFT     2000  . EYE SURGERY Left Nov. 2013   Cataract  . EYE SURGERY Right Feb. 2014   Cataract  . PR VEIN BYPASS GRAFT,AORTO-FEM-POP  2000  . PROSTATE SURGERY  Nov. 2012  . ROBOT ASSISTED LAPAROSCOPIC RADICAL PROSTATECTOMY  08/08/2011   Procedure: ROBOTIC ASSISTED LAPAROSCOPIC RADICAL PROSTATECTOMY LEVEL 2;  Surgeon: Dutch Gray, MD;  Location: [facility];  Service: Urology;  Laterality: Bilateral;  with Bilateral Pelvic Lymphadenectomy   . SHOULDER  SURGERY  1980'S   Social History Social History   Socioeconomic History  . Marital status: Married    Spouse name: Not on file  . Number of children: Not on file  . Years of education: Not on file  . Highest education level: Not on file  Social Needs  . Financial resource strain: Not on file  . Food insecurity - worry: Not on file  . Food insecurity - inability: Not on file  . Transportation needs - medical: Not on file  . Transportation needs - non-medical: Not on file  Occupational History  . Not on file  Tobacco Use  . Smoking status: Former Smoker    Packs/day: 1.50    Years: 43.00    Pack years: 64.50    Types: Cigarettes    Last attempt to quit: 09/23/1998    Years since quitting: 18.9  . Smokeless tobacco: Never Used  Substance and Sexual Activity  . Alcohol use: Yes    Alcohol/week: 8.4 oz    Types: 14 Glasses of wine per week  . Drug use: No  . Sexual activity: Not on file  Other Topics Concern  . Not on file  Social History Narrative  . Not on file   Family History Family History  Problem Relation Age of Onset  . Diabetes Mother   .  Heart disease Mother        CHF  . Heart disease Father   . Heart failure Father        Amputation- Leg  . Deep vein thrombosis Father   . Heart disease Brother        Heart Disease before age 87  . Heart failure Brother   . Hyperlipidemia Brother   . Hypertension Brother   . Heart disease Sister   . Diabetes Sister   . Heart failure Sister     Current Outpatient Medications on File Prior to Visit  Medication Sig Dispense Refill  . aspirin 81 MG tablet Take 81 mg by mouth daily.    . fish oil-omega-3 fatty acids 1000 MG capsule Take 1,200 mg by mouth daily.    Marland Kitchen FLUZONE HIGH-DOSE 0.5 ML injection TO BE ADMINISTERED BY PHARMACIST FOR IMMUNIZATION  0  . folic acid (FOLVITE) 106 MCG tablet Take 400 mcg by mouth daily.    . metoprolol (LOPRESSOR) 50 MG tablet Take 50 mg by mouth 2 (two) times daily.     . Multiple  Vitamins-Minerals (MULTIVITAMIN WITH MINERALS) tablet Take 1 tablet by mouth daily.    . niacin (NIASPAN) 1000 MG CR tablet Take 500 mg by mouth 2 (two) times daily.     Marland Kitchen omeprazole (PRILOSEC) 20 MG capsule Take 20 mg by mouth daily.     . pravastatin (PRAVACHOL) 40 MG tablet Take 10 mg by mouth every evening.     . ramipril (ALTACE) 10 MG capsule Take 10 mg by mouth every morning.     . triamterene-hydrochlorothiazide (DYAZIDE) 37.5-25 MG per capsule Take 0.5 capsules by mouth every morning.      No current facility-administered medications on file prior to visit.    No Known Allergies  ROS: See HPI for pertinent positives and negatives.   Physical Examination  Vitals:   08/19/17 0927 08/19/17 0930  BP: 137/65 132/70  Pulse: (!) 50   Resp: 17   Temp: (!) 97.1 F (36.2 C)   TempSrc: Oral   SpO2: 98%    There is no height or weight on file to calculate BMI.    General: A&O x 3, WD, male  Pulmonary: Sym exp, respirations are non labored, good air movt, CTAB, no rales, rhonchi, or wheezing.   Cardiac: Regular rhythm, bradycardic (on a beta blocker), no detected murmur.  Carotid Bruits Left Right   Negative Negative   Abdominal aortic pulse is not palpable. Radial pulses: right is 2+ palpable, left is not palpable, left radial pulse is 1+ palpable.    VASCULAR EXAM:     LE Pulses LEFT RIGHT   FEMORAL not palpable 1+ palpable    POPLITEAL not palpable  not palpable   POSTERIOR TIBIAL not palpable  faintly palpable    DORSALIS PEDIS  ANTERIOR TIBIAL not palpable  faintly palpable      Gastrointestinal: soft, NTND, -G/R, - HSM, right of center reducible asymptomatic abdominal hernia, - CVAT B.  Musculoskeletal: M/S 5/5 throughout, extremities without ischemic  changes.   Skin: No rashes, no cellulitis, no ulcers.   Neurologic: CN 2-12 intact, Pain and light touch intact in extremities, Motor exam as listed above.     DATA  AAA Duplex (08/19/2017):  Previous size: 3.61 cm (Date: 08-13-16), Limited visualization of the abdominal vasculature; iliac arteries not visualized due to overlying bowel gas and pt body habitus.  Current size:  3.92 cm (Date: 08/19/17); Bilateral CIA not visualized due  to overlying bowel gas  Medical Decision Making  The patient is a 76 y.o. male who presents with asymptomatic AAA with slight increase in size, to 3.92 cm, based on limited visualization.  Pedal pulses are faintly palpable on the right, non palpable on the left, no signs of ischemia in his feet or legs.    Based on this patient's exam and diagnostic studies, the patient will follow up in 1 year  with the following studies: AAA duplex and ABI's.  Consideration for repair of AAA would be made when the size is 5.0 cm, growth > 1 cm/yr, and symptomatic status.        The patient was given information about AAA including signs, symptoms, treatment, and how to minimize the risk of enlargement and rupture of aneurysms.    I emphasized the importance of maximal medical management including strict control of blood pressure, blood glucose, and lipid levels, antiplatelet agents, obtaining regular exercise, and continued cessation of smoking.   The patient was advised to call 911 should the patient experience sudden onset abdominal or back pain.   Thank you for allowing [place] to participate in this patient's care.  Clemon Chambers, RN, MSN, FNP-C [facility] [place] Office: [phone]  Clinic Physician: Early  08/19/2017, 9:48 AM

## 2017-08-19 NOTE — Patient Instructions (Signed)
Before your next abdominal ultrasound:  Take two Extra-Strength Gas-X capsules at bedtime the night before the test. Take another two Extra-Strength Gas-X capsules 3 hours before the test.  Avoid gas forming foods the day before the test.       Abdominal Aortic Aneurysm Blood pumps away from the heart through tubes (blood vessels) called arteries. Aneurysms are weak or damaged places in the wall of an artery. It bulges out like a balloon. An abdominal aortic aneurysm happens in the main artery of the body (aorta). It can burst or tear, causing bleeding inside the body. This is an emergency. It needs treatment right away. What are the causes? The exact cause is unknown. Things that could cause this problem include:  Fat and other substances building up in the lining of a tube.  Swelling of the walls of a blood vessel.  Certain tissue diseases.  Belly (abdominal) trauma.  An infection in the main artery of the body.  What increases the risk? There are things that make it more likely for you to have an aneurysm. These include:  Being over the age of 77 years old.  Having high blood pressure (hypertension).  Being a male.  Being white.  Being very overweight (obese).  Having a family history of aneurysm.  Using tobacco products.  What are the signs or symptoms? Symptoms depend on the size of the aneurysm and how fast it grows. There may not be symptoms. If symptoms occur, they can include:  Pain (belly, side, lower back, or groin).  Feeling full after eating a small amount of food.  Feeling sick to your stomach (nauseous), throwing up (vomiting), or both.  Feeling a lump in your belly that feels like it is beating (pulsating).  Feeling like you will pass out (faint).  How is this treated?  Medicine to control blood pressure and pain.  Imaging tests to see if the aneurysm gets bigger.  Surgery. How is this prevented? To lessen your chance of getting this  condition:  Stop smoking. Stop chewing tobacco.  Limit or avoid alcohol.  Keep your blood pressure, blood sugar, and cholesterol within normal limits.  Eat less salt.  Eat foods low in saturated fats and cholesterol. These are found in animal and whole dairy products.  Eat more fiber. Fiber is found in whole grains, vegetables, and fruits.  Keep a healthy weight.  Stay active and exercise often.  This information is not intended to replace advice given to you by your health care provider. Make sure you discuss any questions you have with your health care provider. Document Released: 01/04/2013 Document Revised: 02/15/2016 Document Reviewed: 10/09/2012 Elsevier Interactive Patient Education  2017 Elsevier Inc.  

## 2017-08-20 NOTE — Addendum Note (Signed)
Addended by: Lianne Cure A on: 08/20/2017 09:58 AM   Modules accepted: Orders

## 2018-02-26 ENCOUNTER — Ambulatory Visit: Payer: Self-pay | Admitting: Surgery

## 2018-04-02 ENCOUNTER — Encounter (INDEPENDENT_AMBULATORY_CARE_PROVIDER_SITE_OTHER): Payer: Medicare Other | Admitting: Ophthalmology

## 2018-04-02 DIAGNOSIS — H35033 Hypertensive retinopathy, bilateral: Secondary | ICD-10-CM | POA: Diagnosis not present

## 2018-04-02 DIAGNOSIS — H35373 Puckering of macula, bilateral: Secondary | ICD-10-CM | POA: Diagnosis not present

## 2018-04-02 DIAGNOSIS — I10 Essential (primary) hypertension: Secondary | ICD-10-CM | POA: Diagnosis not present

## 2018-04-02 DIAGNOSIS — H43813 Vitreous degeneration, bilateral: Secondary | ICD-10-CM | POA: Diagnosis not present

## 2018-06-13 ENCOUNTER — Encounter (HOSPITAL_COMMUNITY): Payer: Self-pay | Admitting: Emergency Medicine

## 2018-06-13 ENCOUNTER — Other Ambulatory Visit: Payer: Self-pay

## 2018-06-13 ENCOUNTER — Emergency Department (HOSPITAL_COMMUNITY)
Admission: EM | Admit: 2018-06-13 | Discharge: 2018-06-13 | Disposition: A | Discharge status: Home / Self Care | Payer: Medicare Other | Attending: Emergency Medicine | Admitting: Emergency Medicine

## 2018-06-13 DIAGNOSIS — I2510 Atherosclerotic heart disease of native coronary artery without angina pectoris: Secondary | ICD-10-CM | POA: Diagnosis not present

## 2018-06-13 DIAGNOSIS — Z87891 Personal history of nicotine dependence: Secondary | ICD-10-CM | POA: Diagnosis not present

## 2018-06-13 DIAGNOSIS — I10 Essential (primary) hypertension: Secondary | ICD-10-CM | POA: Insufficient documentation

## 2018-06-13 DIAGNOSIS — Z79899 Other long term (current) drug therapy: Secondary | ICD-10-CM | POA: Diagnosis not present

## 2018-06-13 DIAGNOSIS — K5641 Fecal impaction: Secondary | ICD-10-CM | POA: Diagnosis not present

## 2018-06-13 DIAGNOSIS — Z7982 Long term (current) use of aspirin: Secondary | ICD-10-CM | POA: Diagnosis not present

## 2018-06-13 DIAGNOSIS — Z951 Presence of aortocoronary bypass graft: Secondary | ICD-10-CM | POA: Insufficient documentation

## 2018-06-13 DIAGNOSIS — K6289 Other specified diseases of anus and rectum: Secondary | ICD-10-CM | POA: Diagnosis present

## 2018-06-13 LAB — COMPREHENSIVE METABOLIC PANEL
ALBUMIN: 3.7 g/dL (ref 3.5–5.0)
ALK PHOS: 91 U/L (ref 38–126)
ALT: 18 U/L (ref 0–44)
ANION GAP: 18 — AB (ref 5–15)
AST: 37 U/L (ref 15–41)
BUN: 31 mg/dL — ABNORMAL HIGH (ref 8–23)
CALCIUM: 9.4 mg/dL (ref 8.9–10.3)
CHLORIDE: 94 mmol/L — AB (ref 98–111)
CO2: 21 mmol/L — AB (ref 22–32)
CREATININE: 1.23 mg/dL (ref 0.61–1.24)
GFR calc Af Amer: >60 mL/min (ref >60)
GFR calc non Af Amer: 54 mL/min — ABNORMAL LOW (ref >60)
Glucose, Bld: 113 mg/dL — ABNORMAL HIGH (ref 70–99)
Potassium: 3.8 mmol/L (ref 3.5–5.1)
SODIUM: 133 mmol/L — AB (ref 135–145)
Total Bilirubin: 1.7 mg/dL — ABNORMAL HIGH (ref 0.3–1.2)
Total Protein: 7.4 g/dL (ref 6.5–8.1)

## 2018-06-13 LAB — CBC
HCT: 41.9 % (ref 39.0–52.0)
HEMOGLOBIN: 14 g/dL (ref 13.0–17.0)
MCH: 32 pg (ref 26.0–34.0)
MCHC: 33.4 g/dL (ref 30.0–36.0)
MCV: 95.9 fL (ref 78.0–100.0)
Platelets: 181 10*3/uL (ref 150–400)
RBC: 4.37 MIL/uL (ref 4.22–5.81)
RDW: 13.3 % (ref 11.5–15.5)
WBC: 10.5 10*3/uL (ref 4.0–10.5)

## 2018-06-13 LAB — LIPASE, BLOOD: Lipase: 23 U/L (ref 11–51)

## 2018-06-13 MED ORDER — MORPHINE SULFATE (PF) 4 MG/ML IV SOLN
4.0000 mg | Freq: Once | INTRAVENOUS | Status: AC
  Administered 2018-06-13: 4 mg via INTRAVENOUS
  Filled 2018-06-13: qty 1

## 2018-06-13 MED ORDER — BISACODYL 10 MG RE SUPP: 10.0000 mg | RECTAL | 0 refills | Status: DC | PRN

## 2018-06-13 MED ORDER — MAGNESIUM CITRATE PO SOLN: 1.0000 | Freq: Once | ORAL | Status: DC

## 2018-06-13 MED ORDER — FLEET ENEMA 7-19 GM/118ML RE ENEM
1.0000 | ENEMA | Freq: Once | RECTAL | Status: AC
  Administered 2018-06-13: 1 via RECTAL
  Filled 2018-06-13: qty 1

## 2018-06-13 MED ORDER — DOCUSATE SODIUM 50 MG/5ML PO LIQD
100.0000 mg | ORAL | Status: AC
  Administered 2018-06-13: 100 mg via ORAL
  Filled 2018-06-13: qty 10

## 2018-06-13 MED ORDER — MAGNESIUM CITRATE PO SOLN
1.0000 | Freq: Once | ORAL | Status: AC
  Administered 2018-06-13: 1 via ORAL
  Filled 2018-06-13: qty 296

## 2018-06-13 MED ORDER — GLYCERIN (LAXATIVE) 2.1 G RE SUPP
1.0000 | Freq: Once | RECTAL | Status: AC
  Administered 2018-06-13: 1 via RECTAL
  Filled 2018-06-13: qty 1

## 2018-06-13 MED ORDER — SENNA 8.6 MG PO TABS
2.0000 | ORAL_TABLET | ORAL | Status: AC
  Administered 2018-06-13: 17.2 mg via ORAL
  Filled 2018-06-13: qty 2

## 2018-06-13 NOTE — ED Triage Notes (Addendum)
Pt states he had a triple hernia repair 1 week ago, and was started on Mirilax and oxy. Pt states he has had liquid stool since the procedure. Pt states they are so frequent he cannot get off of the toilet. Pt states he feels distended but states it might be swelling from the surgery. Pt has no abdominal pain to surgery sight, but states his rectum has pain 9/10.

## 2018-06-13 NOTE — ED Notes (Signed)
MD Melina Copa at bedside to attempt rectal disimpaction

## 2018-06-13 NOTE — ED Notes (Signed)
Unsuccessful disimpaction, plan for fleets enema and reeval

## 2018-06-13 NOTE — ED Provider Notes (Signed)
[facility] EMERGENCY DEPARTMENT Provider Note   CSN: [id number] Arrival date & time: 06/13/18  1123     History   Chief Complaint Chief Complaint  Patient presents with  . Diarrhea  . Rectal pain    HPI Tony [name] is a 77 y.o. male.  He underwent abdominal hernia surgery by Dr. gross on Friday the 13th.  He was treated with OxyContin and MiraLAX.  He states he is not really having any abdominal pain but he has been having rectal pain and feeling like he cannot have a bowel movement.  He is been passing some liquid stool but continues with rectal pressure and pain.  He is talked to the surgery office a few times and today they recommended that he try to manually disimpact himself.  He was unable to accomplish this and so presented to the emergency department.  He denies any fevers chills cough chest pain shortness of breath.  He still passing urine without difficulty.  He last took pain medicine yesterday.  The history is provided by the patient.  Diarrhea   This is a new problem. The current episode started more than 1 week ago. The problem occurs 2 to 4 times per day. The problem has not changed since onset.There has been no fever. Pertinent negatives include no abdominal pain, no vomiting, no chills, no headaches and no cough. The treatment provided no relief. His past medical history is significant for recent abdominal surgery.    Past Medical History:  Diagnosis Date  . CAD (coronary artery disease)   . Gout   . Heart disease   . Hyperlipidemia   . Hypertension   . Myocardial infarction ([place])    MI in 2000  . Peripheral vascular disease ([place])   . Prostate cancer [facility]) Nov. 2012  . Ulcer     Patient Active Problem List   Diagnosis Date Noted  . Thrombocytopenia ([place]) 08/10/2012  . 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 Numbness and tingling 07/28/2012    Past Surgical History:  Procedure Laterality Date  . ANAL  FISSURECTOMY    . CORONARY ARTERY BYPASS GRAFT     2000  . EYE SURGERY Left Nov. 2013   Cataract  . EYE SURGERY Right Feb. 2014   Cataract  . PR VEIN BYPASS GRAFT,AORTO-FEM-POP  2000  . PROSTATE SURGERY  Nov. 2012  . ROBOT ASSISTED LAPAROSCOPIC RADICAL PROSTATECTOMY  08/08/2011   Procedure: ROBOTIC ASSISTED LAPAROSCOPIC RADICAL PROSTATECTOMY LEVEL 2;  Surgeon: Dutch Gray, MD;  Location: [facility];  Service: Urology;  Laterality: Bilateral;  with Bilateral Pelvic Lymphadenectomy   . SHOULDER SURGERY  1980'S        Home Medications    Prior to Admission medications   Medication Sig Start Date End Date Taking? Authorizing Provider  aspirin 81 MG tablet Take 81 mg by mouth daily.    [provider]  fish oil-omega-3 fatty acids 1000 MG capsule Take 1,200 mg by mouth daily.    [provider]  FLUZONE HIGH-DOSE 0.5 ML injection TO BE ADMINISTERED BY PHARMACIST FOR IMMUNIZATION 07/02/17   [provider]  folic acid (FOLVITE) 124 MCG tablet Take 400 mcg by mouth daily.    [provider]  metoprolol (LOPRESSOR) 50 MG tablet Take 50 mg by mouth 2 (two) times daily.     [provider]  Multiple Vitamins-Minerals (MULTIVITAMIN WITH MINERALS) tablet Take 1 tablet by mouth daily.    [provider]  niacin (NIASPAN) 1000 MG CR tablet Take 500 mg by mouth 2 (two) times daily.     [provider]  omeprazole (PRILOSEC) 20 MG capsule Take 20 mg by mouth daily.     [provider]  pravastatin (PRAVACHOL) 40 MG tablet Take 10 mg by mouth every evening.     [provider]  ramipril (ALTACE) 10 MG capsule Take 10 mg by mouth every morning.     [provider]  triamterene-hydrochlorothiazide (DYAZIDE) 37.5-25 MG per capsule Take 0.5 capsules by mouth every morning.     [provider]    Family History Family History  Problem Relation Age of Onset  . Diabetes Mother   . Heart disease Mother         CHF  . Heart disease Father   . Heart failure Father        Amputation- Leg  . Deep vein thrombosis Father   . Heart disease Brother        Heart Disease before age 63  . Heart failure Brother   . Hyperlipidemia Brother   . Hypertension Brother   . Heart disease Sister   . Diabetes Sister   . Heart failure Sister     Social History Social History   Tobacco Use  . Smoking status: Former Smoker    Packs/day: 1.50    Years: 43.00    Pack years: 64.50    Types: Cigarettes    Last attempt to quit: 09/23/1998    Years since quitting: 19.7  . Smokeless tobacco: Never Used  Substance Use Topics  . Alcohol use: Yes    Alcohol/week: 14.0 standard drinks    Types: 14 Glasses of wine per week  . Drug use: No     Allergies   Patient has no known allergies.   Review of Systems Review of Systems  Constitutional: Negative for chills and fever.  HENT: Negative for sore throat.   Eyes: Negative for visual disturbance.  Respiratory: Negative for cough and shortness of breath.   Cardiovascular: Negative for chest pain.  Gastrointestinal: Positive for constipation, diarrhea and rectal pain. Negative for abdominal pain, anal bleeding and vomiting.  Genitourinary: Negative for dysuria.  Musculoskeletal: Negative for neck pain.  Skin: Positive for wound (healing well). Negative for rash.  Neurological: Negative for headaches.     Physical Exam Updated Vital Signs BP (!) 153/105 (BP Location: Right Arm)   Pulse 98   Temp 98 F (36.7 C) (Oral)   Resp 20   SpO2 99%   Physical Exam  Constitutional: He appears well-developed and well-nourished.  HENT:  Head: Normocephalic and atraumatic.  Eyes: Conjunctivae are normal.  Neck: Neck supple.  Cardiovascular: Normal rate and regular rhythm.  No murmur heard. Pulmonary/Chest: Effort normal and breath sounds normal. No respiratory distress.  Abdominal: Soft. There is tenderness (mild diffuse tenderness).  Is got multiple surgical  incisions with Steri-Strips in place.  He is got some bruising below the umbilicus.  Mild tenderness diffusely but no focal tenderness and no masses guarding or rebound.  Musculoskeletal: He exhibits no edema, tenderness or deformity.  Neurological: He is alert.  Skin: Skin is warm and dry. Capillary refill takes less than 2 seconds.  Psychiatric: He has a normal mood and affect.  Nursing note and vitals reviewed.    ED Treatments / Results  Labs (all labs ordered are listed, but only abnormal results are displayed) Labs Reviewed  COMPREHENSIVE METABOLIC PANEL - Abnormal; Notable for  the following components:      Result Value   Sodium 133 (*)    Chloride 94 (*)    CO2 21 (*)    Glucose, Bld 113 (*)    BUN 31 (*)    Total Bilirubin 1.7 (*)    GFR calc non Af Amer 54 (*)    Anion gap 18 (*)    All other components within normal limits  LIPASE, BLOOD  CBC    EKG None  Radiology No results found.  Procedures Fecal disimpaction Date/Time: 06/14/2018 12:35 AM Performed by: Hayden Rasmussen, MD Authorized by: Hayden Rasmussen, MD  Consent: Verbal consent obtained. Consent given by: patient Patient understanding: patient states understanding of the procedure being performed Patient identity confirmed: verbally with patient Local anesthesia used: no  Anesthesia: Local anesthesia used: no  Sedation: Patient sedated: no  Patient tolerance: Patient tolerated the procedure well with no immediate complications    (including critical care time)  Medications Ordered in ED Medications  morphine 4 MG/ML injection 4 mg (4 mg Intravenous Given 06/13/18 1352)  sodium phosphate (FLEET) 7-19 GM/118ML enema 1 enema (1 enema Rectal Given 06/13/18 1435)  docusate (COLACE) 50 MG/5ML liquid 100 mg (100 mg Oral Given 06/13/18 1435)  Glycerin (Adult) 2.1 g suppository 1 suppository (1 suppository Rectal Given 06/13/18 1435)  senna (SENOKOT) tablet 17.2 mg (17.2 mg Oral Given 06/13/18  1435)  magnesium citrate solution 1 Bottle (1 Bottle Oral Given 06/13/18 1622)     Initial Impression / Assessment and Plan / ED Course  I have reviewed the triage vital signs and the nursing notes.  Pertinent labs & imaging results that were available during my care of the patient were reviewed by me and considered in my medical decision making (see chart for details).  Clinical Course as of Jun 14 33  Sat Jun 13, 2018  1400 Chaperone was present during exam. Rectal exam with soft brown stool in vault.  Disimpacted a small amount and will order fleets.   [MB]  0488 he received an enema with moderate results.  He still feeling something there.  I think would be reasonable to send him home with some suppositories   [MB]    Clinical Course User Index [MB] Hayden Rasmussen, MD     Final Clinical Impressions(s) / ED Diagnoses   Final diagnoses:  Fecal impaction [facility])    ED Discharge Orders         Ordered    bisacodyl (DULCOLAX) 10 MG suppository  As needed     06/13/18 1545           Hayden Rasmussen, MD 06/14/18 [id number]

## 2018-06-13 NOTE — ED Notes (Signed)
Pt. Has had multiple liquid diarrheal events with no production of hard clump or solid formed stool to this point.  He keeps having multiple feelings of bowels movements but has not produced quality BM.

## 2018-06-13 NOTE — Discharge Instructions (Signed)
Your evaluated in the emergency department for constipation and rectal pain.  You had a lot of stool in your rectal vault and we have tried some medications and also manual disimpaction to help relieve this.  We are sending you home with some stool suppositories and you should continue to drink plenty of fluids and try to eat some roughage.  Please call your surgeon on Monday for further instructions and return if any worsening symptoms.

## 2018-07-17 ENCOUNTER — Other Ambulatory Visit: Payer: Self-pay | Admitting: Family Medicine

## 2018-07-17 ENCOUNTER — Other Ambulatory Visit (HOSPITAL_COMMUNITY): Payer: Self-pay | Admitting: Family Medicine

## 2018-07-17 DIAGNOSIS — R936 Abnormal findings on diagnostic imaging of limbs: Secondary | ICD-10-CM

## 2018-07-17 DIAGNOSIS — R9389 Abnormal findings on diagnostic imaging of other specified body structures: Secondary | ICD-10-CM

## 2018-07-17 DIAGNOSIS — C61 Malignant neoplasm of prostate: Secondary | ICD-10-CM

## 2018-07-20 ENCOUNTER — Ambulatory Visit
Admission: RE | Admit: 2018-07-20 | Discharge: 2018-07-20 | Disposition: A | Discharge status: Home / Self Care | Payer: Medicare Other | Source: Ambulatory Visit | Attending: Family Medicine | Admitting: Family Medicine

## 2018-07-20 DIAGNOSIS — R9389 Abnormal findings on diagnostic imaging of other specified body structures: Secondary | ICD-10-CM

## 2018-07-20 MED ORDER — IOPAMIDOL (ISOVUE-300) INJECTION 61%
75.0000 mL | Freq: Once | INTRAVENOUS | Status: AC | PRN
  Administered 2018-07-20: 75 mL via INTRAVENOUS

## 2018-07-21 ENCOUNTER — Telehealth: Payer: Self-pay | Admitting: *Deleted

## 2018-07-21 DIAGNOSIS — R918 Other nonspecific abnormal finding of lung field: Secondary | ICD-10-CM

## 2018-07-21 NOTE — Telephone Encounter (Signed)
Oncology Nurse Navigator Documentation  Oncology Nurse Navigator Flowsheets 07/21/2018  Navigator Location [facility]  Referral date to RadOnc/MedOnc 07/21/2018  Navigator Encounter Type Telephone/I received referral on Tony [name] today.  I called and spoke with him and gave him an appt to be seen at [facility] next week. He verbalized understanding of appt time and place.   Telephone Outgoing Call  Treatment Phase Abnormal Scans  Barriers/Navigation Needs Education;Coordination of Care  Education Other  Interventions Coordination of Care;Education  Coordination of Care Appts  Education Method Verbal  Acuity Level 1  Time Spent with Patient 30

## 2018-07-27 ENCOUNTER — Encounter (HOSPITAL_COMMUNITY)
Admission: RE | Admit: 2018-07-27 | Discharge: 2018-07-27 | Disposition: A | Discharge status: Home / Self Care | Payer: Medicare Other | Source: Ambulatory Visit | Attending: Family Medicine | Admitting: Family Medicine

## 2018-07-27 DIAGNOSIS — R936 Abnormal findings on diagnostic imaging of limbs: Secondary | ICD-10-CM | POA: Insufficient documentation

## 2018-07-27 DIAGNOSIS — C61 Malignant neoplasm of prostate: Secondary | ICD-10-CM | POA: Diagnosis present

## 2018-07-27 LAB — GLUCOSE, CAPILLARY: Glucose-Capillary: 95 mg/dL (ref 70–99)

## 2018-07-27 MED ORDER — FLUDEOXYGLUCOSE F - 18 (FDG) INJECTION
8.9000 | Freq: Once | INTRAVENOUS | Status: AC | PRN
  Administered 2018-07-27: 8.9 via INTRAVENOUS

## 2018-07-30 ENCOUNTER — Inpatient Hospital Stay: Payer: Medicare Other

## 2018-07-30 ENCOUNTER — Institutional Professional Consult (permissible substitution): Payer: Medicare Other | Admitting: Thoracic Surgery (Cardiothoracic Vascular Surgery)

## 2018-07-30 ENCOUNTER — Ambulatory Visit
Admission: RE | Admit: 2018-07-30 | Discharge: 2018-07-30 | Disposition: A | Discharge status: Home / Self Care | Payer: Medicare Other | Source: Ambulatory Visit | Attending: Radiation Oncology | Admitting: Radiation Oncology

## 2018-07-30 ENCOUNTER — Encounter: Payer: Self-pay | Admitting: Internal Medicine

## 2018-07-30 ENCOUNTER — Inpatient Hospital Stay: Payer: Medicare Other | Attending: Internal Medicine | Admitting: Internal Medicine

## 2018-07-30 VITALS — BP 154/69 | HR 58 | Temp 98.6°F | Resp 14 | Ht 69.0 in | Wt 177.3 lb

## 2018-07-30 VITALS — BP 154/69 | HR 58 | Temp 98.6°F | Resp 14 | Wt 177.3 lb

## 2018-07-30 VITALS — BP 154/69 | HR 58 | Temp 98.6°F | Wt 177.3 lb

## 2018-07-30 DIAGNOSIS — C7931 Secondary malignant neoplasm of brain: Secondary | ICD-10-CM | POA: Diagnosis not present

## 2018-07-30 DIAGNOSIS — M25511 Pain in right shoulder: Secondary | ICD-10-CM | POA: Insufficient documentation

## 2018-07-30 DIAGNOSIS — K629 Disease of anus and rectum, unspecified: Secondary | ICD-10-CM

## 2018-07-30 DIAGNOSIS — C3491 Malignant neoplasm of unspecified part of right bronchus or lung: Secondary | ICD-10-CM | POA: Diagnosis not present

## 2018-07-30 DIAGNOSIS — M542 Cervicalgia: Secondary | ICD-10-CM | POA: Diagnosis not present

## 2018-07-30 DIAGNOSIS — C3431 Malignant neoplasm of lower lobe, right bronchus or lung: Secondary | ICD-10-CM | POA: Insufficient documentation

## 2018-07-30 DIAGNOSIS — C781 Secondary malignant neoplasm of mediastinum: Secondary | ICD-10-CM | POA: Insufficient documentation

## 2018-07-30 DIAGNOSIS — C771 Secondary and unspecified malignant neoplasm of intrathoracic lymph nodes: Secondary | ICD-10-CM | POA: Insufficient documentation

## 2018-07-30 DIAGNOSIS — I2510 Atherosclerotic heart disease of native coronary artery without angina pectoris: Secondary | ICD-10-CM | POA: Insufficient documentation

## 2018-07-30 DIAGNOSIS — C3490 Malignant neoplasm of unspecified part of unspecified bronchus or lung: Secondary | ICD-10-CM

## 2018-07-30 DIAGNOSIS — Z7982 Long term (current) use of aspirin: Secondary | ICD-10-CM | POA: Insufficient documentation

## 2018-07-30 DIAGNOSIS — R918 Other nonspecific abnormal finding of lung field: Secondary | ICD-10-CM

## 2018-07-30 DIAGNOSIS — I10 Essential (primary) hypertension: Secondary | ICD-10-CM | POA: Diagnosis not present

## 2018-07-30 DIAGNOSIS — C7970 Secondary malignant neoplasm of unspecified adrenal gland: Secondary | ICD-10-CM | POA: Insufficient documentation

## 2018-07-30 DIAGNOSIS — C7951 Secondary malignant neoplasm of bone: Secondary | ICD-10-CM | POA: Insufficient documentation

## 2018-07-30 DIAGNOSIS — Z79899 Other long term (current) drug therapy: Secondary | ICD-10-CM | POA: Insufficient documentation

## 2018-07-30 LAB — CBC WITH DIFFERENTIAL (CANCER CENTER ONLY)
ABS IMMATURE GRANULOCYTES: 0.05 10*3/uL (ref 0.00–0.07)
Basophils Absolute: 0 10*3/uL (ref 0.0–0.1)
Basophils Relative: 1 %
EOS ABS: 0.1 10*3/uL (ref 0.0–0.5)
Eosinophils Relative: 1 %
HEMATOCRIT: 41.2 % (ref 39.0–52.0)
HEMOGLOBIN: 13.8 g/dL (ref 13.0–17.0)
IMMATURE GRANULOCYTES: 1 %
LYMPHS ABS: 1.6 10*3/uL (ref 0.7–4.0)
LYMPHS PCT: 25 %
MCH: 32.8 pg (ref 26.0–34.0)
MCHC: 33.5 g/dL (ref 30.0–36.0)
MCV: 97.9 fL (ref 80.0–100.0)
MONOS PCT: 10 %
Monocytes Absolute: 0.7 10*3/uL (ref 0.1–1.0)
NEUTROS PCT: 62 %
Neutro Abs: 4.2 10*3/uL (ref 1.7–7.7)
Platelet Count: 189 10*3/uL (ref 150–400)
RBC: 4.21 MIL/uL — ABNORMAL LOW (ref 4.22–5.81)
RDW: 14.9 % (ref 11.5–15.5)
WBC Count: 6.6 10*3/uL (ref 4.0–10.5)
nRBC: 0 % (ref 0.0–0.2)

## 2018-07-30 LAB — CMP (CANCER CENTER ONLY)
ALBUMIN: 3.7 g/dL (ref 3.5–5.0)
ALT: 9 U/L (ref 0–44)
AST: 15 U/L (ref 15–41)
Alkaline Phosphatase: 108 U/L (ref 38–126)
Anion gap: 9 (ref 5–15)
BILIRUBIN TOTAL: 0.6 mg/dL (ref 0.3–1.2)
BUN: 10 mg/dL (ref 8–23)
CHLORIDE: 100 mmol/L (ref 98–111)
CO2: 28 mmol/L (ref 22–32)
Calcium: 9.9 mg/dL (ref 8.9–10.3)
Creatinine: 1.02 mg/dL (ref 0.61–1.24)
Glucose, Bld: 112 mg/dL — ABNORMAL HIGH (ref 70–99)
POTASSIUM: 4.6 mmol/L (ref 3.5–5.1)
SODIUM: 137 mmol/L (ref 135–145)
TOTAL PROTEIN: 7.4 g/dL (ref 6.5–8.1)

## 2018-07-30 MED ORDER — LORAZEPAM 1 MG PO TABS: ORAL_TABLET | ORAL | 0 refills | Status: DC

## 2018-07-30 NOTE — Progress Notes (Signed)
Radiation Oncology         [phone] ________________________________  Multidisciplinary Thoracic Oncology Clinic [facility]) Initial Outpatient Consultation  Name: Tony [name] MRN: [id number]  Date: 07/30/2018  DOB: [birth year]  CC:Tony Ruff, MD  Tony Ruff, MD   REFERRING PHYSICIAN: [name], MD  DIAGNOSIS: Presumptive Stage IV Lung Cancer    ICD-10-CM   1. 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 male who is accompanied by his wife. He has a history of prostate cancer and is s/p prostatectomy. Bone scan performed in 05/2011 showed no metastatic disease at that time. He presented to his orthopaedic surgeon, Dr. Onnie [name], with right shoulder pain. An MRI was performed on 07/13/18 and revealed an "aggressive lesion of the glenoid and base of the coracoid process suspicious for metastatic disease." He returned to his PCP, Dr. Drema [name], who ordered a follow up chest CT scan. The scan was performed on 07/20/18 revealed: soft tissue mass lesion in the medial aspect of the right lower lobe as described consistent with primary pulmonary neoplasm till proven otherwise, associated subcarinal adenopathy is noted; expansile lytic lesion within the spinous process and lamina of L1 as well as some endplate irregularity inferiorly at L1 consistent with metastatic disease; new lesions within the adrenal glands bilaterally suspicious for metastatic disease given the lung findings.  Patient then proceeded to PET scan on 07/27/18, which revealed: findings consistent with right lower lobe primary bronchogenic carcinoma, metastatic disease to the mediastinum, right (and likely left) adrenal glands, and bones as detailed above; anal hypermetabolism warrants physical exam correlation to exclude synchronous polyp or mass.  He describes his right-sided pain as "annoying" but bearable, however he adds it makes sleeping on that side very difficult.  also complains of pain in his left neck especially  when turning his head to the left.  The patient was referred today for presentation in the multidisciplinary conference.  Radiology studies and pathology slides were presented there for review and discussion of treatment options.  A consensus was discussed regarding potential next steps.  PREVIOUS RADIATION THERAPY: No  PAST MEDICAL HISTORY:  has a past medical history of CAD (coronary artery disease), Gout, Heart disease, Hyperlipidemia, Hypertension, Myocardial infarction [facility]), Peripheral vascular disease ([place]), Prostate cancer ([place]) (Nov. 2012), and Ulcer.    PAST SURGICAL HISTORY: Past Surgical History:  Procedure Laterality Date  . ANAL FISSURECTOMY    . CORONARY ARTERY BYPASS GRAFT     2000  . EYE SURGERY Left Nov. 2013   Cataract  . EYE SURGERY Right Feb. 2014   Cataract  . PR VEIN BYPASS GRAFT,AORTO-FEM-POP  2000  . PROSTATE SURGERY  Nov. 2012  . ROBOT ASSISTED LAPAROSCOPIC RADICAL PROSTATECTOMY  08/08/2011   Procedure: ROBOTIC ASSISTED LAPAROSCOPIC RADICAL PROSTATECTOMY LEVEL 2;  Surgeon: Tony Gray, MD;  Location: [facility]S;  Service: Urology;  Laterality: Bilateral;  with Bilateral Pelvic Lymphadenectomy   . SHOULDER SURGERY  1980'S    FAMILY HISTORY: family history includes Deep vein thrombosis in his father; Diabetes in his mother and sister; Heart disease in his brother, father, mother, and sister; Heart failure in his brother, father, and sister; Hyperlipidemia in his brother; Hypertension in his brother.  SOCIAL HISTORY:  reports that he quit smoking about 19 years ago. His smoking use included cigarettes. He has a 64.50 pack-year smoking history. He has never used smokeless tobacco. He reports that he drinks about 14.0 standard drinks of alcohol per week.  He reports that he does not use drugs.  ALLERGIES: No known allergies  MEDICATIONS:  Current Outpatient Medications  Medication Sig Dispense Refill  . acetaminophen  (TYLENOL) 650 MG CR tablet Take 650 mg by mouth every 8 (eight) hours as needed for pain.    Marland Kitchen aspirin 81 MG tablet Take 81 mg by mouth daily.    . bisacodyl (DULCOLAX) 10 MG suppository Place 1 suppository (10 mg total) rectally as needed for moderate constipation. (Patient not taking: Reported on 07/30/2018) 12 suppository 0  . colchicine (COLCRYS) 0.6 MG tablet Take 1 tablet by mouth as needed.    . fish oil-omega-3 fatty acids 1000 MG capsule Take 1 g by mouth daily.     . folic acid (FOLVITE) 627 MCG tablet Take 800 mcg by mouth daily.     Marland Kitchen LORazepam (ATIVAN) 1 MG tablet 1 tablet p.o. 30 minutes before MRI 2 tablet 0  . metoprolol (LOPRESSOR) 50 MG tablet Take 50 mg by mouth 2 (two) times daily.     . Multiple Vitamins-Minerals (MULTIVITAMIN WITH MINERALS) tablet Take 1 tablet by mouth daily.    . niacin (NIASPAN) 1000 MG CR tablet Take 500 mg by mouth 2 (two) times daily.     Marland Kitchen omeprazole (PRILOSEC) 20 MG capsule Take 20 mg by mouth once a week.     Marland Kitchen oxyCODONE (OXY IR/ROXICODONE) 5 MG immediate release tablet Take 5-10 mg by mouth every 6 (six) hours as needed for moderate pain.     . polyethylene glycol (MIRALAX / GLYCOLAX) packet Take 17 g by mouth daily.    . pravastatin (PRAVACHOL) 10 MG tablet     . ramipril (ALTACE) 10 MG capsule Take 10 mg by mouth every morning.     . triamterene-hydrochlorothiazide (DYAZIDE) 37.5-25 MG per capsule Take 0.5 capsules by mouth every morning.      No current facility-administered medications for this encounter.     REVIEW OF SYSTEMS:  A 10+ POINT REVIEW OF SYSTEMS WAS OBTAINED including neurology, dermatology, psychiatry, cardiac, respiratory, lymph, extremities, GI, GU, musculoskeletal, constitutional, reproductive, HEENT. All pertinent positives are noted in the HPI. All others are negative.   PHYSICAL EXAM:  Vitals with BMI 07/30/2018  Height 5\' 9"   Weight 177 lbs 5 oz  BMI 03.50  Systolic 093  Diastolic 69  Pulse 58  Respirations 14    General: Alert and oriented, in no acute distress HEENT: Head is normocephalic. Extraocular movements are intact. Oropharynx is clear. Neck: Neck is supple, no palpable cervical or supraclavicular lymphadenopathy. Heart: Regular in rate and rhythm with no murmurs, rubs, or gallops. Chest: Clear to auscultation bilaterally, with no rhonchi, wheezes, or rales. Abdomen: Soft, nontender, nondistended, with no rigidity or guarding. Extremities: No cyanosis or edema. Lymphatics: see Neck Exam Skin: No concerning lesions. Musculoskeletal: symmetric strength and muscle tone throughout. Mild tenderness to palpation along right shoulder and left lower cervical spine area. Neurologic: Cranial nerves II through XII are grossly intact. No obvious focalities. Speech is fluent. Coordination is intact. Psychiatric: Judgment and insight are intact. Affect is appropriate.   KPS = 80  100 - Normal; no complaints; no evidence of disease. 90   - Able to carry on normal activity; minor signs or symptoms of disease. 80   - Normal activity with effort; some signs or symptoms of disease. 77   - Cares for self; unable to carry on normal activity or to do active work. 60   - Requires occasional assistance, but is  able to care for most of his personal needs. 50   - Requires considerable assistance and frequent medical care. 21   - Disabled; requires special care and assistance. 92   - Severely disabled; hospital admission is indicated although death not imminent. 16   - Very sick; hospital admission necessary; active supportive treatment necessary. 10   - Moribund; fatal processes progressing rapidly. 0     - Dead  Karnofsky DA, Abelmann [place], Craver LS and Burchenal [facility] [phone]) The use of the nitrogen mustards in the palliative treatment of carcinoma: with particular reference to bronchogenic carcinoma Cancer 1 [id number]  LABORATORY DATA:  Lab Results  Component Value Date   WBC 6.6 07/30/2018   HGB 13.8  07/30/2018   HCT 41.2 07/30/2018   MCV 97.9 07/30/2018   PLT 189 07/30/2018   Lab Results  Component Value Date   NA 137 07/30/2018   K 4.6 07/30/2018   CL 100 07/30/2018   CO2 28 07/30/2018   Lab Results  Component Value Date   ALT 9 07/30/2018   AST 15 07/30/2018   ALKPHOS 108 07/30/2018   BILITOT 0.6 07/30/2018    PULMONARY FUNCTION TEST:   Recent Review Flowsheet Data    There is no flowsheet data to display.      RADIOGRAPHY: Ct Chest W Contrast  Result Date: 07/21/2018 CLINICAL DATA:  Abnormal chest x-ray EXAM: CT CHEST WITH CONTRAST TECHNIQUE: Multidetector CT imaging of the chest was performed during intravenous contrast administration. CONTRAST:  19mL ISOVUE-300 IOPAMIDOL (ISOVUE-300) INJECTION 61% COMPARISON:  07/16/2018 chest x-ray, CT of the abdomen and pelvis from 06/12/2011 FINDINGS: Cardiovascular: Atherosclerotic calcifications are noted of the thoracic aorta. No aneurysmal dilatation is noted. Changes of prior coronary bypass grafting are noted. Native coronary calcifications are seen. The pulmonary artery as visualized is within normal limits although not timed for embolus evaluation. Mediastinum/Nodes: The esophagus is within normal limits. The thoracic inlet is unremarkable. 18 mm subcarinal lymph node is noted best seen on image number 77 of series 2 with some adjacent smaller lymph nodes seen. No significant hilar adenopathy is identified. Lungs/Pleura: In the medial aspect of the right lower lobe corresponding to the area of abnormality on prior CT examination there is a 3.6 x 3.1 cm soft tissue mass lesion identified extending into the as ago esophageal recess. This is consistent with a primary pulmonary neoplasm till proven otherwise. Attenuation of 1 of the branches of the right lower lobe bronchial tree is noted medially. No sizable effusion is seen. No other focal parenchymal abnormality is noted within the lungs. Upper Abdomen: Liver demonstrates scattered  hypodensities. Some of these are stable from a prior CT examination dated 06/12/2011 and likely benign in etiology. Some of the smaller hypodensities likely represent cysts. Scattered cysts are noted within the kidneys. No renal calculi or obstructive changes are seen. There is new fullness within the right adrenal gland measuring approximately 2.6 by 1.3 cm in greatest dimension. This was not seen on the prior CT examination. Similar findings but to a lesser degree are noted within the left adrenal gland also not present on the prior CT examination. Given the changes within the right lower lobe these are suspicious for metastatic lesions. No other focal abnormality in the upper abdomen is seen. Musculoskeletal: Degenerative changes of the thoracic spine are noted. Mildly expansile lesion is noted within the spinous process and lamina of L1. Some endplate changes are noted inferiorly at L1 which were not seen on the prior  exam and also somewhat suspicious for metastatic disease. IMPRESSION: Soft tissue mass lesion in the medial aspect of the right lower lobe as described consistent with primary pulmonary neoplasm till proven otherwise. Associated subcarinal adenopathy is noted. Tissue sampling and PET-CT are recommended for further workup. Expansile lytic lesion within the spinous process and lamina of L1 as well as some endplate irregularity inferiorly at L1 consistent with metastatic disease. New lesions within the adrenal glands bilaterally suspicious for metastatic disease given the lung findings. These can also be evaluated with PET-CT. Chronic appearing changes as described above. Aortic Atherosclerosis (ICD10-I70.0). These results will be called to the ordering clinician or representative by the Radiologist Assistant, and communication documented in the PACS or zVision Dashboard. Electronically Signed   By: Inez Catalina M.D.   On: 07/21/2018 09:09   Nm Pet Image Initial (pi) Skull Base To Thigh  Result  Date: 07/27/2018 CLINICAL DATA:  Initial treatment strategy for right lower lobe lung mass. EXAM: NUCLEAR MEDICINE PET SKULL BASE TO THIGH TECHNIQUE: 8.9 mCi F-18 FDG was injected intravenously. Full-ring PET imaging was performed from the skull base to thigh after the radiotracer. CT data was obtained and used for attenuation correction and anatomic localization. Fasting blood glucose: 95 mg/dl COMPARISON:  Chest CT 07/20/2018 FINDINGS: Mediastinal blood pool activity: SUV max 2.6 NECK: No areas of abnormal hypermetabolism. Incidental CT findings: Cerebral and cerebellar atrophy. No cervical adenopathy. Bilateral carotid atherosclerosis. CHEST: Central right lower lobe hypermetabolic lung mass, which measures 4.2 x 3.8 cm and a S.U.V. max of 16.1 on image 40/8. Adjacent adenopathy within the azygoesophageal recess measures 2.2 x 1.5 cm and a S.U.V. max of 12.3 on image 68/4. Incidental CT findings: Median sternotomy for CABG. Other chest findings deferred to recent diagnostic CT. ABDOMEN/PELVIS: Right adrenal nodularity and hypermetabolism. 2.2 cm and a S.U.V. max of 11.6. There is more mild left adrenal hypermetabolism and minimal nodularity, including on a S.U.V. max of 3.6. Anal hypermetabolism is without well-defined CT correlate. This measures a S.U.V. max of 13.1 including on image 186/4. Incidental CT findings: Infrarenal abdominal aortic ectasia at 3.2 cm. Bilateral renal vascular calcifications. Low-density bilateral renal lesions are likely cysts. Extensive colonic diverticulosis. Prostatectomy. SKELETON: Multifocal osseous metastasis. A left-sided anterior C1 arch lytic lesion measures 2.9 cm and a S.U.V. max of 14.8 on image 18/4. Hypermetabolic right glenoid lesion of a S.U.V. max of 15.3. L1 lesion was detailed on prior diagnostic CT. There is also a small lesion in the left iliac wing including on image 152/4. Incidental CT findings: none IMPRESSION: 1. Findings consistent with right lower lobe  primary bronchogenic carcinoma. Metastatic disease to the mediastinum, right (and likely left) adrenal glands, and bones as detailed above. 2. Anal hypermetabolism warrants physical exam correlation to exclude synchronous polyp or mass. Electronically Signed   By: Abigail Miyamoto M.D.   On: 07/27/2018 10:31      IMPRESSION: Presumptive Stage IV Lung Cancer  The patient is symptomatic from his presumptive right glenoid fossa and C1 metastases resulting in right shoulder and left neck pain.  Today, I talked to the patient and his wife about the findings and work-up thus far.  We discussed the natural history of lung cancer and general treatment, highlighting the role of radiotherapy in the management.  We discussed the available radiation techniques, and focused on the details of logistics and delivery.  We reviewed the anticipated acute and late sequelae associated with radiation in this setting.  The patient was encouraged to  ask questions that I answered to the best of my ability. The patient would like to proceed with radiation and will be scheduled for CT simulation.   PLAN: Patient will meet with Dr. Roxan Hockey later today and formulate a biopsy plan. This will likely be bronchoscopy and EBUS. Patient will likely be scheduled for CT simulation next week, but we'll hold starting radiation treatment until biopsy results are back. Anticipate 10 treatments directed to the right shoulder and upper cervical spine area    ------------------------------------------------  Blair Promise, PhD, MD  This document serves as a record of services personally performed by Gery Pray, MD. It was created on his behalf by Wilburn Mylar, a trained medical scribe. The creation of this record is based on the scribe's personal observations and the provider's statements to them. This document has been checked and approved by the attending provider.

## 2018-07-30 NOTE — Progress Notes (Signed)
PCP is [name], MD Referring Provider is [name], MD  No chief complaint on file.   HPI: Tony [name] is sent for consultation re: right lung mass with metastases  Tony [name] is a 77 yo former smoker with a history of CABG (EBG 19 years ago), PAD, hypertension, hyperlipidemia. He presented with right shoulder pain. Workup included MR which showed a mass. CT and PET showed a right lower lobe lung mass with subcarinal adenopathy and multiple bone mets.  Still has right shoulder pain. Denies CP, SOB, weight loss, headaches, hemoptysis.  Zubrod Score: At the time of surgery this patient's most appropriate activity status/level should be described as: []     0    Normal activity, no symptoms [x]     1    Restricted in physical strenuous activity but ambulatory, able to do out light work []     2    Ambulatory and capable of self care, unable to do work activities, up and about >50 % of waking hours                              []     3    Only limited self care, in bed greater than 50% of waking hours []     4    Completely disabled, no self care, confined to bed or chair []     5    Moribund    Past Medical History:  Diagnosis Date  . CAD (coronary artery disease)   . Gout   . Heart disease   . Hyperlipidemia   . Hypertension   . Myocardial infarction ([place])    MI in 2000  . Peripheral vascular disease ([place])   . Prostate cancer [facility]) Nov. 2012  . Ulcer     Past Surgical History:  Procedure Laterality Date  . ANAL FISSURECTOMY    . CORONARY ARTERY BYPASS GRAFT     2000  . EYE SURGERY Left Nov. 2013   Cataract  . EYE SURGERY Right Feb. 2014   Cataract  . PR VEIN BYPASS GRAFT,AORTO-FEM-POP  2000  . PROSTATE SURGERY  Nov. 2012  . ROBOT ASSISTED LAPAROSCOPIC RADICAL PROSTATECTOMY  08/08/2011   Procedure: ROBOTIC ASSISTED LAPAROSCOPIC RADICAL PROSTATECTOMY LEVEL 2;  Surgeon: Dutch Gray, MD;  Location: [facility];  Service: Urology;  Laterality: Bilateral;  with Bilateral  Pelvic Lymphadenectomy   . SHOULDER SURGERY  1980'S    Family History  Problem Relation Age of Onset  . Diabetes Mother   . Heart disease Mother        CHF  . Heart disease Father   . Heart failure Father        Amputation- Leg  . Deep vein thrombosis Father   . Heart disease Brother        Heart Disease before age 74  . Heart failure Brother   . Hyperlipidemia Brother   . Hypertension Brother   . Heart disease Sister   . Diabetes Sister   . Heart failure Sister     Social History Social History   Tobacco Use  . Smoking status: Former Smoker    Packs/day: 1.50    Years: 43.00    Pack years: 64.50    Types: Cigarettes    Last attempt to quit: 09/23/1998    Years since quitting: 19.8  . Smokeless tobacco: Never Used  Substance Use Topics  . Alcohol use: Yes    Alcohol/week: 14.0  standard drinks    Types: 14 Glasses of wine per week  . Drug use: No    Current Outpatient Medications  Medication Sig Dispense Refill  . acetaminophen (TYLENOL) 650 MG CR tablet Take 650 mg by mouth every 8 (eight) hours as needed for pain.    Marland Kitchen aspirin 81 MG tablet Take 81 mg by mouth daily.    . bisacodyl (DULCOLAX) 10 MG suppository Place 1 suppository (10 mg total) rectally as needed for moderate constipation. (Patient not taking: Reported on 07/30/2018) 12 suppository 0  . colchicine (COLCRYS) 0.6 MG tablet Take 1 tablet by mouth as needed.    . fish oil-omega-3 fatty acids 1000 MG capsule Take 1 g by mouth daily.     . folic acid (FOLVITE) 725 MCG tablet Take 800 mcg by mouth daily.     Marland Kitchen LORazepam (ATIVAN) 1 MG tablet 1 tablet p.o. 30 minutes before MRI 2 tablet 0  . metoprolol (LOPRESSOR) 50 MG tablet Take 50 mg by mouth 2 (two) times daily.     . Multiple Vitamins-Minerals (MULTIVITAMIN WITH MINERALS) tablet Take 1 tablet by mouth daily.    . niacin (NIASPAN) 1000 MG CR tablet Take 500 mg by mouth 2 (two) times daily.     Marland Kitchen omeprazole (PRILOSEC) 20 MG capsule Take 20 mg by mouth  once a week.     Marland Kitchen oxyCODONE (OXY IR/ROXICODONE) 5 MG immediate release tablet Take 5-10 mg by mouth every 6 (six) hours as needed for moderate pain.     . polyethylene glycol (MIRALAX / GLYCOLAX) packet Take 17 g by mouth daily.    . pravastatin (PRAVACHOL) 10 MG tablet     . ramipril (ALTACE) 10 MG capsule Take 10 mg by mouth every morning.     . triamterene-hydrochlorothiazide (DYAZIDE) 37.5-25 MG per capsule Take 0.5 capsules by mouth every morning.      No current facility-administered medications for this visit.     Allergies  Allergen Reactions  . No Known Allergies     Review of Systems  Constitutional: Negative for activity change and unexpected weight change.  HENT: Negative for trouble swallowing and voice change.   Respiratory: Negative for cough, chest tightness, shortness of breath and wheezing.   Cardiovascular: Negative for chest pain.  Musculoskeletal: Positive for arthralgias.    BP (!) 154/69   Pulse (!) 58   Temp 98.6 F (37 C)   Resp 14   Wt 177 lb 4.8 oz (80.4 kg)   SpO2 100%   BMI 26.18 kg/m  Physical Exam  Constitutional: He is oriented to person, place, and time. He appears well-developed and well-nourished. No distress.  HENT:  Head: Normocephalic and atraumatic.  Mouth/Throat: No oropharyngeal exudate.  Eyes: Conjunctivae and EOM are normal. No scleral icterus.  Neck: Neck supple. No thyromegaly present.  Cardiovascular: Normal rate and regular rhythm.  No murmur heard. Pulmonary/Chest: Effort normal. No respiratory distress. He has no wheezes. He has no rales.  Abdominal: Soft. He exhibits no distension. There is no tenderness.  Musculoskeletal:  Limited ROM right shoulder  Lymphadenopathy:    He has no cervical adenopathy.  Neurological: He is alert and oriented to person, place, and time. No cranial nerve deficit. He exhibits normal muscle tone.  Skin: Skin is warm and dry.  Vitals reviewed.  Diagnostic Tests: NUCLEAR MEDICINE PET  SKULL BASE TO THIGH  TECHNIQUE: 8.9 mCi F-18 FDG was injected intravenously. Full-ring PET imaging was performed from the skull base to thigh  after the radiotracer. CT data was obtained and used for attenuation correction and anatomic localization.  Fasting blood glucose: 95 mg/dl  COMPARISON:  Chest CT 07/20/2018  FINDINGS: Mediastinal blood pool activity: SUV max 2.6  NECK: No areas of abnormal hypermetabolism.  Incidental CT findings: Cerebral and cerebellar atrophy. No cervical adenopathy. Bilateral carotid atherosclerosis.  CHEST: Central right lower lobe hypermetabolic lung mass, which measures 4.2 x 3.8 cm and a S.U.V. max of 16.1 on image 40/8.  Adjacent adenopathy within the azygoesophageal recess measures 2.2 x 1.5 cm and a S.U.V. max of 12.3 on image 68/4.  Incidental CT findings: Median sternotomy for CABG. Other chest findings deferred to recent diagnostic CT.  ABDOMEN/PELVIS: Right adrenal nodularity and hypermetabolism. 2.2 cm and a S.U.V. max of 11.6. There is more mild left adrenal hypermetabolism and minimal nodularity, including on a S.U.V. max of 3.6.  Anal hypermetabolism is without well-defined CT correlate. This measures a S.U.V. max of 13.1 including on image 186/4.  Incidental CT findings: Infrarenal abdominal aortic ectasia at 3.2 cm. Bilateral renal vascular calcifications. Low-density bilateral renal lesions are likely cysts. Extensive colonic diverticulosis. Prostatectomy.  SKELETON: Multifocal osseous metastasis. A left-sided anterior C1 arch lytic lesion measures 2.9 cm and a S.U.V. max of 14.8 on image 18/4.  Hypermetabolic right glenoid lesion of a S.U.V. max of 15.3.  L1 lesion was detailed on prior diagnostic CT. There is also a small lesion in the left iliac wing including on image 152/4.  Incidental CT findings: none  IMPRESSION: 1. 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  Metastatic disease to the mediastinum, right (and likely left) adrenal glands, and bones as detailed above. 2. Anal hypermetabolism warrants physical exam correlation to exclude synchronous polyp or mass.   Electronically Signed   By: Abigail Miyamoto M.D.   On: 07/27/2018 10:31 I personally reviewed the images and concur with the findings noted above  Impression: 77 yo former smoker presents with right shoulder pain that turns out to be due to metastasis from lung cancer. He needs a tissue diagnosis to guide appropriate therapy.  I recommended bronchoscopy and EBUS for diagnostic purposes.  I discussed the general nature of the procedure with the Kneidls. Will plan to do under general anesthesia to allow for adequate tissue sampling for molecular testing. We will plan to do the procedure as an outpatient. I informed them of the indications, risks, benefits and alternatives. They understand the risks include, but are not limited to death, stroke, MI, DVT/PE, bleeding, pneumothorax, failure to make a diagnosis, as well as other unforeseeable complications.   He accepts the risks and agrees to proceed.  Plan: Bronchoscopy and EBUS- my office will call to schedule next week   Melrose Nakayama, MD [facility] [phone]

## 2018-07-30 NOTE — H&P (View-Only) (Signed)
PCP is [name], MD Referring Provider is [name], MD  No chief complaint on file.   HPI: Tony [name] is sent for consultation re: right lung mass with metastases  Tony [name] is a 77 yo former smoker with a history of CABG (EBG 19 years ago), PAD, hypertension, hyperlipidemia. He presented with right shoulder pain. Workup included MR which showed a mass. CT and PET showed a right lower lobe lung mass with subcarinal adenopathy and multiple bone mets.  Still has right shoulder pain. Denies CP, SOB, weight loss, headaches, hemoptysis.  Zubrod Score: At the time of surgery this patient's most appropriate activity status/level should be described as: []     0    Normal activity, no symptoms [x]     1    Restricted in physical strenuous activity but ambulatory, able to do out light work []     2    Ambulatory and capable of self care, unable to do work activities, up and about >50 % of waking hours                              []     3    Only limited self care, in bed greater than 50% of waking hours []     4    Completely disabled, no self care, confined to bed or chair []     5    Moribund    Past Medical History:  Diagnosis Date  . CAD (coronary artery disease)   . Gout   . Heart disease   . Hyperlipidemia   . Hypertension   . Myocardial infarction ([place])    MI in 2000  . Peripheral vascular disease ([place])   . Prostate cancer [facility]) Nov. 2012  . Ulcer     Past Surgical History:  Procedure Laterality Date  . ANAL FISSURECTOMY    . CORONARY ARTERY BYPASS GRAFT     2000  . EYE SURGERY Left Nov. 2013   Cataract  . EYE SURGERY Right Feb. 2014   Cataract  . PR VEIN BYPASS GRAFT,AORTO-FEM-POP  2000  . PROSTATE SURGERY  Nov. 2012  . ROBOT ASSISTED LAPAROSCOPIC RADICAL PROSTATECTOMY  08/08/2011   Procedure: ROBOTIC ASSISTED LAPAROSCOPIC RADICAL PROSTATECTOMY LEVEL 2;  Surgeon: Dutch Gray, MD;  Location: [facility];  Service: Urology;  Laterality: Bilateral;  with Bilateral  Pelvic Lymphadenectomy   . SHOULDER SURGERY  1980'S    Family History  Problem Relation Age of Onset  . Diabetes Mother   . Heart disease Mother        CHF  . Heart disease Father   . Heart failure Father        Amputation- Leg  . Deep vein thrombosis Father   . Heart disease Brother        Heart Disease before age 38  . Heart failure Brother   . Hyperlipidemia Brother   . Hypertension Brother   . Heart disease Sister   . Diabetes Sister   . Heart failure Sister     Social History Social History   Tobacco Use  . Smoking status: Former Smoker    Packs/day: 1.50    Years: 43.00    Pack years: 64.50    Types: Cigarettes    Last attempt to quit: 09/23/1998    Years since quitting: 19.8  . Smokeless tobacco: Never Used  Substance Use Topics  . Alcohol use: Yes    Alcohol/week: 14.0  standard drinks    Types: 14 Glasses of wine per week  . Drug use: No    Current Outpatient Medications  Medication Sig Dispense Refill  . acetaminophen (TYLENOL) 650 MG CR tablet Take 650 mg by mouth every 8 (eight) hours as needed for pain.    Marland Kitchen aspirin 81 MG tablet Take 81 mg by mouth daily.    . bisacodyl (DULCOLAX) 10 MG suppository Place 1 suppository (10 mg total) rectally as needed for moderate constipation. (Patient not taking: Reported on 07/30/2018) 12 suppository 0  . colchicine (COLCRYS) 0.6 MG tablet Take 1 tablet by mouth as needed.    . fish oil-omega-3 fatty acids 1000 MG capsule Take 1 g by mouth daily.     . folic acid (FOLVITE) 681 MCG tablet Take 800 mcg by mouth daily.     Marland Kitchen LORazepam (ATIVAN) 1 MG tablet 1 tablet p.o. 30 minutes before MRI 2 tablet 0  . metoprolol (LOPRESSOR) 50 MG tablet Take 50 mg by mouth 2 (two) times daily.     . Multiple Vitamins-Minerals (MULTIVITAMIN WITH MINERALS) tablet Take 1 tablet by mouth daily.    . niacin (NIASPAN) 1000 MG CR tablet Take 500 mg by mouth 2 (two) times daily.     Marland Kitchen omeprazole (PRILOSEC) 20 MG capsule Take 20 mg by mouth  once a week.     Marland Kitchen oxyCODONE (OXY IR/ROXICODONE) 5 MG immediate release tablet Take 5-10 mg by mouth every 6 (six) hours as needed for moderate pain.     . polyethylene glycol (MIRALAX / GLYCOLAX) packet Take 17 g by mouth daily.    . pravastatin (PRAVACHOL) 10 MG tablet     . ramipril (ALTACE) 10 MG capsule Take 10 mg by mouth every morning.     . triamterene-hydrochlorothiazide (DYAZIDE) 37.5-25 MG per capsule Take 0.5 capsules by mouth every morning.      No current facility-administered medications for this visit.     Allergies  Allergen Reactions  . No Known Allergies     Review of Systems  Constitutional: Negative for activity change and unexpected weight change.  HENT: Negative for trouble swallowing and voice change.   Respiratory: Negative for cough, chest tightness, shortness of breath and wheezing.   Cardiovascular: Negative for chest pain.  Musculoskeletal: Positive for arthralgias.    BP (!) 154/69   Pulse (!) 58   Temp 98.6 F (37 C)   Resp 14   Wt 177 lb 4.8 oz (80.4 kg)   SpO2 100%   BMI 26.18 kg/m  Physical Exam  Constitutional: He is oriented to person, place, and time. He appears well-developed and well-nourished. No distress.  HENT:  Head: Normocephalic and atraumatic.  Mouth/Throat: No oropharyngeal exudate.  Eyes: Conjunctivae and EOM are normal. No scleral icterus.  Neck: Neck supple. No thyromegaly present.  Cardiovascular: Normal rate and regular rhythm.  No murmur heard. Pulmonary/Chest: Effort normal. No respiratory distress. He has no wheezes. He has no rales.  Abdominal: Soft. He exhibits no distension. There is no tenderness.  Musculoskeletal:  Limited ROM right shoulder  Lymphadenopathy:    He has no cervical adenopathy.  Neurological: He is alert and oriented to person, place, and time. No cranial nerve deficit. He exhibits normal muscle tone.  Skin: Skin is warm and dry.  Vitals reviewed.  Diagnostic Tests: NUCLEAR MEDICINE PET  SKULL BASE TO THIGH  TECHNIQUE: 8.9 mCi F-18 FDG was injected intravenously. Full-ring PET imaging was performed from the skull base to thigh  after the radiotracer. CT data was obtained and used for attenuation correction and anatomic localization.  Fasting blood glucose: 95 mg/dl  COMPARISON:  Chest CT 07/20/2018  FINDINGS: Mediastinal blood pool activity: SUV max 2.6  NECK: No areas of abnormal hypermetabolism.  Incidental CT findings: Cerebral and cerebellar atrophy. No cervical adenopathy. Bilateral carotid atherosclerosis.  CHEST: Central right lower lobe hypermetabolic lung mass, which measures 4.2 x 3.8 cm and a S.U.V. max of 16.1 on image 40/8.  Adjacent adenopathy within the azygoesophageal recess measures 2.2 x 1.5 cm and a S.U.V. max of 12.3 on image 68/4.  Incidental CT findings: Median sternotomy for CABG. Other chest findings deferred to recent diagnostic CT.  ABDOMEN/PELVIS: Right adrenal nodularity and hypermetabolism. 2.2 cm and a S.U.V. max of 11.6. There is more mild left adrenal hypermetabolism and minimal nodularity, including on a S.U.V. max of 3.6.  Anal hypermetabolism is without well-defined CT correlate. This measures a S.U.V. max of 13.1 including on image 186/4.  Incidental CT findings: Infrarenal abdominal aortic ectasia at 3.2 cm. Bilateral renal vascular calcifications. Low-density bilateral renal lesions are likely cysts. Extensive colonic diverticulosis. Prostatectomy.  SKELETON: Multifocal osseous metastasis. A left-sided anterior C1 arch lytic lesion measures 2.9 cm and a S.U.V. max of 14.8 on image 18/4.  Hypermetabolic right glenoid lesion of a S.U.V. max of 15.3.  L1 lesion was detailed on prior diagnostic CT. There is also a small lesion in the left iliac wing including on image 152/4.  Incidental CT findings: none  IMPRESSION: 1. 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  Metastatic disease to the mediastinum, right (and likely left) adrenal glands, and bones as detailed above. 2. Anal hypermetabolism warrants physical exam correlation to exclude synchronous polyp or mass.   Electronically Signed   By: Abigail Miyamoto M.D.   On: 07/27/2018 10:31 I personally reviewed the images and concur with the findings noted above  Impression: 77 yo former smoker presents with right shoulder pain that turns out to be due to metastasis from lung cancer. He needs a tissue diagnosis to guide appropriate therapy.  I recommended bronchoscopy and EBUS for diagnostic purposes.  I discussed the general nature of the procedure with the Kneidls. Will plan to do under general anesthesia to allow for adequate tissue sampling for molecular testing. We will plan to do the procedure as an outpatient. I informed them of the indications, risks, benefits and alternatives. They understand the risks include, but are not limited to death, stroke, MI, DVT/PE, bleeding, pneumothorax, failure to make a diagnosis, as well as other unforeseeable complications.   He accepts the risks and agrees to proceed.  Plan: Bronchoscopy and EBUS- my office will call to schedule next week   Melrose Nakayama, MD [facility] [phone]

## 2018-07-30 NOTE — Progress Notes (Signed)
Tony [name] [phone]   [phone] Multidisciplinary thoracic oncology clinic  CONSULT NOTE  REFERRING PHYSICIAN: Dr. Leighton [name]  REASON FOR CONSULTATION:  77 years old male with suspicious lung cancer.  HPI Tony [name] is a 77 y.o. male with past medical history significant for coronary artery disease, hypertension, dyslipidemia, myocardial infarction in 2000, peripheral vascular disease, history of prostate cancer in 2012 status post surgical resection.  The patient mentioned that she has been complaining of right shoulder pain since July 2019.  He was referred to Tony [name] and was treated for bursitis with the steroids.  He did not have any significant improvement in his symptoms and on a follow-up visit he had a repeat MRI of the shoulder which showed suspicious lesion in the right shoulder area.  The patient was referred back to his primary care physician and chest x-ray on July 13, 2018 showed a suspicious right lung mass.  This was followed by CT scan of the chest with contrast on July 20, 2018 and that showed a 3.6 x 3.1 cm soft tissue mass lesion in the medial aspect of the right lower lobe extending into the as azgo-esophageal recess.  This is consistent with a primary pulmonary neoplasm.  There was attenuation of 1 of the branches of the right lower lobe bronchial tree medially.  There was also a 1.8 cm subcarinal lymph node.  There was also expansile lytic lesion within the spinous process and the lamina of L1 as well as some endplate irregularity inferiorly at L1 consistent with metastatic disease.  There are also new lesions within the adrenal glands bilaterally suspicious for metastatic disease.  The patient had a PET scan on July 24, 2018 and that showed findings consistent with the right lower lobe primary bronchogenic carcinoma.  There was metastatic disease to the mediastinum, right and likely left adrenal glands as well as  bones.  There was also anal hypermetabolism that were not physical exam correlation to exclude synchronous polyp or mass. The patient was referred to multidisciplinary thoracic oncology clinic today for evaluation and recommendation regarding his condition. When seen today he continues to have pain in the right shoulder area and he takes only Tylenol extra strength once daily for this pain.  He also has night cough with no significant shortness of breath or hemoptysis.  He lost around 10 pounds in the last 6 months.  He denied having any nausea, vomiting, diarrhea but has constipation.  His last colonoscopy was performed more than 10 years ago by Dr. Cristina [name].  The patient has no headache or visual changes. Family history significant for mother with some type of cancer, father had heart disease and peripheral vascular disease. The patient is married and has 1 daughter.  He was accompanied by his wife Tony [name].  He is currently retired and used to work for [employer].  The patient has a history of smoking more than 1 pack/day for around 43 years and quit in 2000.  He drinks half a glass of wine daily and no history of drug abuse.  HPI  Past Medical History:  Diagnosis Date  . CAD (coronary artery disease)   . Gout   . Heart disease   . Hyperlipidemia   . Hypertension   . Myocardial infarction ([place])    MI in 2000  . Peripheral vascular disease ([place])   . Prostate cancer [facility]) Nov. 2012  . Ulcer     Past Surgical History:  Procedure  Laterality Date  . ANAL FISSURECTOMY    . CORONARY ARTERY BYPASS GRAFT     2000  . EYE SURGERY Left Nov. 2013   Cataract  . EYE SURGERY Right Feb. 2014   Cataract  . PR VEIN BYPASS GRAFT,AORTO-FEM-POP  2000  . PROSTATE SURGERY  Nov. 2012  . ROBOT ASSISTED LAPAROSCOPIC RADICAL PROSTATECTOMY  08/08/2011   Procedure: ROBOTIC ASSISTED LAPAROSCOPIC RADICAL PROSTATECTOMY LEVEL 2;  Surgeon: Tony Gray, MD;  Location: [facility];  Service: Urology;  Laterality:  Bilateral;  with Bilateral Pelvic Lymphadenectomy   . SHOULDER SURGERY  1980'S    Family History  Problem Relation Age of Onset  . Diabetes Mother   . Heart disease Mother        CHF  . Heart disease Father   . Heart failure Father        Amputation- Leg  . Deep vein thrombosis Father   . Heart disease Brother        Heart Disease before age 24  . Heart failure Brother   . Hyperlipidemia Brother   . Hypertension Brother   . Heart disease Sister   . Diabetes Sister   . Heart failure Sister     Social History Social History   Tobacco Use  . Smoking status: Former Smoker    Packs/day: 1.50    Years: 43.00    Pack years: 64.50    Types: Cigarettes    Last attempt to quit: 09/23/1998    Years since quitting: 19.8  . Smokeless tobacco: Never Used  Substance Use Topics  . Alcohol use: Yes    Alcohol/week: 14.0 standard drinks    Types: 14 Glasses of wine per week  . Drug use: No    Allergies  Allergen Reactions  . No Known Allergies     Current Outpatient Medications  Medication Sig Dispense Refill  . acetaminophen (TYLENOL) 650 MG CR tablet Take 650 mg by mouth every 8 (eight) hours as needed for pain.    Marland Kitchen aspirin 81 MG tablet Take 81 mg by mouth daily.    . bisacodyl (DULCOLAX) 10 MG suppository Place 1 suppository (10 mg total) rectally as needed for moderate constipation. 12 suppository 0  . fish oil-omega-3 fatty acids 1000 MG capsule Take 1 g by mouth daily.     . folic acid (FOLVITE) 852 MCG tablet Take 800 mcg by mouth daily.     . metoprolol (LOPRESSOR) 50 MG tablet Take 50 mg by mouth 2 (two) times daily.     . Multiple Vitamins-Minerals (MULTIVITAMIN WITH MINERALS) tablet Take 1 tablet by mouth daily.    . niacin (NIASPAN) 1000 MG CR tablet Take 500 mg by mouth 2 (two) times daily.     Marland Kitchen omeprazole (PRILOSEC) 20 MG capsule Take 20 mg by mouth once a week.     Marland Kitchen oxyCODONE (OXY IR/ROXICODONE) 5 MG immediate release tablet Take 5-10 mg by mouth every 6 (six)  hours as needed for moderate pain.     . polyethylene glycol (MIRALAX / GLYCOLAX) packet Take 17 g by mouth daily.    . pravastatin (PRAVACHOL) 40 MG tablet Take 10 mg by mouth every evening.     . ramipril (ALTACE) 10 MG capsule Take 10 mg by mouth every morning.     . triamterene-hydrochlorothiazide (DYAZIDE) 37.5-25 MG per capsule Take 0.5 capsules by mouth every morning.      No current facility-administered medications for this visit.     Review of Systems  Constitutional: positive for fatigue and weight loss Eyes: negative Ears, nose, mouth, throat, and face: negative Respiratory: positive for cough Cardiovascular: negative Gastrointestinal: positive for constipation Genitourinary:negative Integument/breast: negative Hematologic/lymphatic: negative Musculoskeletal:positive for Right shoulder pain Neurological: negative Behavioral/Psych: negative Endocrine: negative Allergic/Immunologic: negative  Physical Exam  FXT:KWIOX, healthy, no distress, well nourished, well developed and anxious SKIN: skin color, texture, turgor are normal, no rashes or significant lesions HEAD: Normocephalic, No masses, lesions, tenderness or abnormalities EYES: normal, PERRLA, Conjunctiva are pink and non-injected EARS: External ears normal, Canals clear OROPHARYNX:no exudate, no erythema and lips, buccal mucosa, and tongue normal  NECK: supple, no adenopathy, no JVD LYMPH:  no palpable lymphadenopathy, no hepatosplenomegaly LUNGS: clear to auscultation , and palpation HEART: regular rate & rhythm, no murmurs and no gallops ABDOMEN:abdomen soft, non-tender, normal bowel sounds and no masses or organomegaly BACK: No CVA tenderness, Range of motion is normal EXTREMITIES:no joint deformities, effusion, or inflammation, no edema  NEURO: alert & oriented x 3 with fluent speech, no focal motor/sensory deficits  PERFORMANCE STATUS: ECOG 1  LABORATORY DATA: Lab Results  Component Value Date    WBC 6.6 07/30/2018   HGB 13.8 07/30/2018   HCT 41.2 07/30/2018   MCV 97.9 07/30/2018   PLT 189 07/30/2018      Chemistry      Component Value Date/Time   NA 133 (L) 06/13/2018 1203   NA 139 08/10/2012 1328   K 3.8 06/13/2018 1203   K 4.1 08/10/2012 1328   CL 94 (L) 06/13/2018 1203   CL 105 08/10/2012 1328   CO2 21 (L) 06/13/2018 1203   CO2 29 08/10/2012 1328   BUN 31 (H) 06/13/2018 1203   BUN 18.0 08/10/2012 1328   CREATININE 1.23 06/13/2018 1203   CREATININE 1.1 08/10/2012 1328      Component Value Date/Time   CALCIUM 9.4 06/13/2018 1203   CALCIUM 9.0 08/10/2012 1328   ALKPHOS 91 06/13/2018 1203   ALKPHOS 144 08/10/2012 1328   AST 37 06/13/2018 1203   AST 42 (H) 08/10/2012 1328   ALT 18 06/13/2018 1203   ALT 55 08/10/2012 1328   BILITOT 1.7 (H) 06/13/2018 1203   BILITOT 0.91 08/10/2012 1328       RADIOGRAPHIC STUDIES: Ct Chest W Contrast  Result Date: 07/21/2018 CLINICAL DATA:  Abnormal chest x-ray EXAM: CT CHEST WITH CONTRAST TECHNIQUE: Multidetector CT imaging of the chest was performed during intravenous contrast administration. CONTRAST:  64mL ISOVUE-300 IOPAMIDOL (ISOVUE-300) INJECTION 61% COMPARISON:  07/16/2018 chest x-ray, CT of the abdomen and pelvis from 06/12/2011 FINDINGS: Cardiovascular: Atherosclerotic calcifications are noted of the thoracic aorta. No aneurysmal dilatation is noted. Changes of prior coronary bypass grafting are noted. Native coronary calcifications are seen. The pulmonary artery as visualized is within normal limits although not timed for embolus evaluation. Mediastinum/Nodes: The esophagus is within normal limits. The thoracic inlet is unremarkable. 18 mm subcarinal lymph node is noted best seen on image number 77 of series 2 with some adjacent smaller lymph nodes seen. No significant hilar adenopathy is identified. Lungs/Pleura: In the medial aspect of the right lower lobe corresponding to the area of abnormality on prior CT examination  there is a 3.6 x 3.1 cm soft tissue mass lesion identified extending into the as ago esophageal recess. This is consistent with a primary pulmonary neoplasm till proven otherwise. Attenuation of 1 of the branches of the right lower lobe bronchial tree is noted medially. No sizable effusion is seen. No other focal parenchymal abnormality is noted  within the lungs. Upper Abdomen: Liver demonstrates scattered hypodensities. Some of these are stable from a prior CT examination dated 06/12/2011 and likely benign in etiology. Some of the smaller hypodensities likely represent cysts. Scattered cysts are noted within the kidneys. No renal calculi or obstructive changes are seen. There is new fullness within the right adrenal gland measuring approximately 2.6 by 1.3 cm in greatest dimension. This was not seen on the prior CT examination. Similar findings but to a lesser degree are noted within the left adrenal gland also not present on the prior CT examination. Given the changes within the right lower lobe these are suspicious for metastatic lesions. No other focal abnormality in the upper abdomen is seen. Musculoskeletal: Degenerative changes of the thoracic spine are noted. Mildly expansile lesion is noted within the spinous process and lamina of L1. Some endplate changes are noted inferiorly at L1 which were not seen on the prior exam and also somewhat suspicious for metastatic disease. IMPRESSION: Soft tissue mass lesion in the medial aspect of the right lower lobe as described consistent with primary pulmonary neoplasm till proven otherwise. Associated subcarinal adenopathy is noted. Tissue sampling and PET-CT are recommended for further workup. Expansile lytic lesion within the spinous process and lamina of L1 as well as some endplate irregularity inferiorly at L1 consistent with metastatic disease. New lesions within the adrenal glands bilaterally suspicious for metastatic disease given the lung findings. These can  also be evaluated with PET-CT. Chronic appearing changes as described above. Aortic Atherosclerosis (ICD10-I70.0). These results will be called to the ordering clinician or representative by the Radiologist Assistant, and communication documented in the PACS or zVision Dashboard. Electronically Signed   By: Inez Catalina M.D.   On: 07/21/2018 09:09   Nm Pet Image Initial (pi) Skull Base To Thigh  Result Date: 07/27/2018 CLINICAL DATA:  Initial treatment strategy for right lower lobe lung mass. EXAM: NUCLEAR MEDICINE PET SKULL BASE TO THIGH TECHNIQUE: 8.9 mCi F-18 FDG was injected intravenously. Full-ring PET imaging was performed from the skull base to thigh after the radiotracer. CT data was obtained and used for attenuation correction and anatomic localization. Fasting blood glucose: 95 mg/dl COMPARISON:  Chest CT 07/20/2018 FINDINGS: Mediastinal blood pool activity: SUV max 2.6 NECK: No areas of abnormal hypermetabolism. Incidental CT findings: Cerebral and cerebellar atrophy. No cervical adenopathy. Bilateral carotid atherosclerosis. CHEST: Central right lower lobe hypermetabolic lung mass, which measures 4.2 x 3.8 cm and a S.U.V. max of 16.1 on image 40/8. Adjacent adenopathy within the azygoesophageal recess measures 2.2 x 1.5 cm and a S.U.V. max of 12.3 on image 68/4. Incidental CT findings: Median sternotomy for CABG. Other chest findings deferred to recent diagnostic CT. ABDOMEN/PELVIS: Right adrenal nodularity and hypermetabolism. 2.2 cm and a S.U.V. max of 11.6. There is more mild left adrenal hypermetabolism and minimal nodularity, including on a S.U.V. max of 3.6. Anal hypermetabolism is without well-defined CT correlate. This measures a S.U.V. max of 13.1 including on image 186/4. Incidental CT findings: Infrarenal abdominal aortic ectasia at 3.2 cm. Bilateral renal vascular calcifications. Low-density bilateral renal lesions are likely cysts. Extensive colonic diverticulosis. Prostatectomy.  SKELETON: Multifocal osseous metastasis. A left-sided anterior C1 arch lytic lesion measures 2.9 cm and a S.U.V. max of 14.8 on image 18/4. Hypermetabolic right glenoid lesion of a S.U.V. max of 15.3. L1 lesion was detailed on prior diagnostic CT. There is also a small lesion in the left iliac wing including on image 152/4. Incidental CT findings: none IMPRESSION: 1.  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 Metastatic disease to the mediastinum, right (and likely left) adrenal glands, and bones as detailed above. 2. Anal hypermetabolism warrants physical exam correlation to exclude synchronous polyp or mass. Electronically Signed   By: Abigail Miyamoto M.D.   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   PLAN: I had a lengthy discussion with the patient and his wife today about his current condition and further evaluation to confirm his diagnosis and treatment options. I personally and independently reviewed the PET scan images and discussed the result and showed the images to the patient and his wife. I recommended for the patient to complete the staging work-up by ordering MRI of the brain to rule out brain metastasis. I will also arrange for the patient to see Dr. Roxan Hockey later today for consideration of bronchoscopy with endobronchial ultrasound and biopsy for tissue diagnosis. The patient will see Dr. Sondra Come later today for evaluation and discussion of palliative radiotherapy to the right shoulder and other painful bone lesions. For the suspicious anal lesion, I would refer the patient to Dr. Cristina [name] for  consideration of repeat colonoscopy and evaluation of the suspicious lesion seen on the PET scan. I will see the patient back for follow-up visit in around 2 weeks for evaluation and more detailed discussion of his treatment options based on the final pathology and staging work-up. The patient was advised to call immediately if he has any concerning symptoms in the interval.  The patient voices understanding of current disease status and treatment options and is in agreement with the current care plan.  All questions were answered. The patient knows to call the clinic with any problems, questions or concerns. We can certainly see the patient much sooner if necessary.  Thank you so much for allowing me to participate in the care of Tony [name]. I will continue to follow up the patient with you and assist in his care.  I spent 40 minutes counseling the patient face to face. The total time spent in the appointment was 60 minutes.  Disclaimer: This note was dictated with voice recognition software. Similar sounding words can inadvertently be transcribed and may not be corrected upon review.   Eilleen Kempf July 30, 2018, 1:56 PM

## 2018-07-31 ENCOUNTER — Other Ambulatory Visit: Payer: Self-pay | Admitting: *Deleted

## 2018-07-31 DIAGNOSIS — R918 Other nonspecific abnormal finding of lung field: Secondary | ICD-10-CM

## 2018-08-01 ENCOUNTER — Ambulatory Visit (HOSPITAL_COMMUNITY)
Admission: RE | Admit: 2018-08-01 | Discharge: 2018-08-01 | Disposition: A | Discharge status: Home / Self Care | Payer: Medicare Other | Source: Ambulatory Visit | Attending: Internal Medicine | Admitting: Internal Medicine

## 2018-08-01 DIAGNOSIS — C3490 Malignant neoplasm of unspecified part of unspecified bronchus or lung: Secondary | ICD-10-CM | POA: Insufficient documentation

## 2018-08-01 DIAGNOSIS — C7951 Secondary malignant neoplasm of bone: Secondary | ICD-10-CM | POA: Insufficient documentation

## 2018-08-01 MED ORDER — GADOBUTROL 1 MMOL/ML IV SOLN
10.0000 mL | Freq: Once | INTRAVENOUS | Status: AC | PRN
  Administered 2018-08-01: 8 mL via INTRAVENOUS

## 2018-08-03 NOTE — Pre-Procedure Instructions (Signed)
Tony [name]  08/03/2018    Your procedure is scheduled on Thursday, August 06, 2018 at 3:15 PM.   Report to [facility] Entrance "A" Admitting Office at 1:15 PM.   Call this number if you have problems the morning of surgery: [phone]   Questions prior to day of surgery, please call [phone] between 8 & 4 PM.   Remember:  Do not eat or drink after midnight Wednesday, 08/05/18.  Take these medicines the morning of surgery with A SIP OF WATER: Metoprolol (Lopressor), Tylenol - if needed  Stop Fish Oil and Multivitamins as of today prior to surgery.    Do not wear jewelry.  Do not wear lotions, powders, cologne or deodorant.             Men may shave face and neck.  Do not bring valuables to the hospital.  [facility] is not responsible for any belongings or valuables.  Contacts, dentures or bridgework may not be worn into surgery.  Leave your suitcase in the car.  After surgery it may be brought to your room.  For patients admitted to the hospital, discharge time will be determined by your treatment team.  Patients discharged the day of surgery will not be allowed to drive home.   [place] - Preparing for Surgery  Before surgery, you can play an important role.  Because skin is not sterile, your skin needs to be as free of germs as possible.  You can reduce the number of germs on you skin by washing with CHG (chlorahexidine gluconate) soap before surgery.  CHG is an antiseptic cleaner which kills germs and bonds with the skin to continue killing germs even after washing.  Oral Hygiene is also important in reducing the risk of infection.  Remember to brush your teeth with your regular toothpaste the morning of surgery.  Please DO NOT use if you have an allergy to CHG or antibacterial soaps.  If your skin becomes reddened/irritated stop using the CHG and inform your nurse when you arrive at Short Stay.  Do not shave (including legs and underarms) for at  least 48 hours prior to the first CHG shower.  You may shave your face.  Please follow these instructions carefully:   1.  Shower with CHG Soap the night before surgery and the morning of Surgery.  2.  If you choose to wash your hair, wash your hair first as usual with your normal shampoo.  3.  After you shampoo, rinse your hair and body thoroughly to remove the shampoo. 4.  Use CHG as you would any other liquid soap.  You can apply chg directly to the skin and wash gently with a      scrungie or washcloth.           5.  Apply the CHG Soap to your body ONLY FROM THE NECK DOWN.   Do not use on open wounds or open sores. Avoid contact with your eyes, ears, mouth and genitals (private parts).  Wash genitals (private parts) with your normal soap.  6.  Wash thoroughly, paying special attention to the area where your surgery will be performed.  7.  Thoroughly rinse your body with warm water from the neck down.  8.  DO NOT shower/wash with your normal soap after using and rinsing off the CHG Soap.  9.  Pat yourself dry with a clean towel.            10.  Wear clean pajamas.            11.  Place clean sheets on your bed the night of your first shower and do not sleep with pets.  Day of Surgery  Shower as above. Do not apply any lotions/deodorants the morning of surgery.   Please wear clean clothes to the hospital. Remember to brush your teeth with toothpaste.   Please read over the fact sheets that you were given.

## 2018-08-04 ENCOUNTER — Encounter (HOSPITAL_COMMUNITY)
Admission: RE | Admit: 2018-08-04 | Discharge: 2018-08-04 | Disposition: A | Discharge status: Home / Self Care | Payer: Medicare Other | Source: Ambulatory Visit | Attending: Thoracic Surgery (Cardiothoracic Vascular Surgery) | Admitting: Thoracic Surgery (Cardiothoracic Vascular Surgery)

## 2018-08-04 ENCOUNTER — Ambulatory Visit (HOSPITAL_COMMUNITY)
Admission: RE | Admit: 2018-08-04 | Discharge: 2018-08-04 | Disposition: A | Discharge status: Home / Self Care | Payer: Medicare Other | Source: Ambulatory Visit | Attending: Thoracic Surgery (Cardiothoracic Vascular Surgery) | Admitting: Thoracic Surgery (Cardiothoracic Vascular Surgery)

## 2018-08-04 ENCOUNTER — Other Ambulatory Visit: Payer: Self-pay

## 2018-08-04 ENCOUNTER — Encounter (HOSPITAL_COMMUNITY): Payer: Self-pay

## 2018-08-04 DIAGNOSIS — I10 Essential (primary) hypertension: Secondary | ICD-10-CM | POA: Insufficient documentation

## 2018-08-04 DIAGNOSIS — Z01818 Encounter for other preprocedural examination: Secondary | ICD-10-CM | POA: Insufficient documentation

## 2018-08-04 DIAGNOSIS — I447 Left bundle-branch block, unspecified: Secondary | ICD-10-CM | POA: Insufficient documentation

## 2018-08-04 DIAGNOSIS — R918 Other nonspecific abnormal finding of lung field: Secondary | ICD-10-CM

## 2018-08-04 HISTORY — DX: Constipation, unspecified: K59.00

## 2018-08-04 HISTORY — DX: Anemia, unspecified: D64.9

## 2018-08-04 HISTORY — DX: Personal history of other medical treatment: Z92.89

## 2018-08-04 LAB — PROTIME-INR
INR: 1.03
Prothrombin Time: 13.4 seconds (ref 11.4–15.2)

## 2018-08-04 LAB — COMPREHENSIVE METABOLIC PANEL
ALT: 14 U/L (ref 0–44)
ANION GAP: 8 (ref 5–15)
AST: 21 U/L (ref 15–41)
Albumin: 3.5 g/dL (ref 3.5–5.0)
Alkaline Phosphatase: 94 U/L (ref 38–126)
BUN: 12 mg/dL (ref 8–23)
CO2: 26 mmol/L (ref 22–32)
CREATININE: 0.96 mg/dL (ref 0.61–1.24)
Calcium: 9.4 mg/dL (ref 8.9–10.3)
Chloride: 102 mmol/L (ref 98–111)
GFR calc non Af Amer: >60 mL/min (ref >60)
Glucose, Bld: 139 mg/dL — ABNORMAL HIGH (ref 70–99)
Potassium: 3.8 mmol/L (ref 3.5–5.1)
Sodium: 136 mmol/L (ref 135–145)
Total Bilirubin: 0.3 mg/dL (ref 0.3–1.2)
Total Protein: 6.7 g/dL (ref 6.5–8.1)

## 2018-08-04 LAB — CBC
HCT: 43.2 % (ref 39.0–52.0)
HEMOGLOBIN: 13.8 g/dL (ref 13.0–17.0)
MCH: 31.8 pg (ref 26.0–34.0)
MCHC: 31.9 g/dL (ref 30.0–36.0)
MCV: 99.5 fL (ref 80.0–100.0)
Platelets: 202 10*3/uL (ref 150–400)
RBC: 4.34 MIL/uL (ref 4.22–5.81)
RDW: 14.7 % (ref 11.5–15.5)
WBC: 6.4 10*3/uL (ref 4.0–10.5)
nRBC: 0 % (ref 0.0–0.2)

## 2018-08-04 LAB — APTT: aPTT: 37 seconds — ABNORMAL HIGH (ref 24–36)

## 2018-08-04 NOTE — Progress Notes (Addendum)
Pt has hx of CAD with CABG in 2000. Pt's cardiologist is Dr. Wynonia Lawman. Pt denies any recent chest pain or sob. Have requested last EKG, stress test and last office visit from Dr. Thurman Coyer office. Pt states he is not diabetic. 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 Donnetta Hutching follows.

## 2018-08-05 NOTE — Anesthesia Preprocedure Evaluation (Addendum)
Anesthesia Evaluation  Patient identified by MRN, date of birth, ID band Patient awake    Reviewed: Allergy & Precautions, NPO status , Patient's Chart, lab work & pertinent test results  Airway Mallampati: I  TM Distance: >3 FB Neck ROM: Full    Dental   Pulmonary former smoker,    Pulmonary exam normal        Cardiovascular hypertension, Pt. on medications + CAD, + Past MI and + CABG  Normal cardiovascular exam     Neuro/Psych    GI/Hepatic   Endo/Other    Renal/GU      Musculoskeletal   Abdominal   Peds  Hematology   Anesthesia Other Findings   Reproductive/Obstetrics                            Anesthesia Physical Anesthesia Plan  ASA: III  Anesthesia Plan: General   Post-op Pain Management:    Induction: Intravenous  PONV Risk Score and Plan:   Airway Management Planned: Oral ETT  Additional Equipment:   Intra-op Plan:   Post-operative Plan: Extubation in OR  Informed Consent: I have reviewed the patients History and Physical, chart, labs and discussed the procedure including the risks, benefits and alternatives for the proposed anesthesia with the patient or authorized representative who has indicated his/her understanding and acceptance.     Plan Discussed with: CRNA and Surgeon  Anesthesia Plan Comments: (See PAT note 08/04/2018 by Karoline Caldwell, PA-C )       Anesthesia Quick Evaluation

## 2018-08-05 NOTE — Progress Notes (Signed)
Anesthesia Chart Review:  Case:  [id number] Date/Time:  08/06/18 1501   Procedure:  VIDEO BRONCHOSCOPY WITH ENDOBRONCHIAL ULTRASOUND (N/A )   Anesthesia type:  General   Pre-op diagnosis:  RLL MASS   Location:  [facility] OR ROOM 10 / [place] OR   Surgeon:  Melrose Nakayama, MD      DISCUSSION: 77 yo male former smoker. Pertinent hx includes MI and CABG x 4 in 2000, LBBB, HTN, Gout, AAA (3.92 cm 08/19/17), Prostate cancer in 2012 status post surgical resection, Now with right lung mass with metastases.  Pt follows with Dr. Wynonia Lawman. Last seen 02/13/2018. Per OV note pt doing well, lost 17lbs and exercising more regularly, no anginal symptoms. Known LBBB. He had stress testing in 2017 negative for ischemia and echo showing moderate to severe concentric LVH with hypokinesis of the inferolateral wall.  Moderate decrease in global wall motion.  Estimated EF 40 to 55%.  Grade 1 diastolic dysfunction.  Mild to moderate mitral regurgitation.  Anticipate he can proceed as planned barring acute status change.  VS: BP (!) 148/65   Pulse 85   Temp 36.6 C   Resp 18   Ht 5\' 9"  (1.753 m)   Wt 80.6 kg   SpO2 98%   BMI 26.23 kg/m   PROVIDERS: [name], MD is PCP  Ezzard Standing, MD is Cardiologist  LABS: Labs reviewed: Acceptable for surgery. (all labs ordered are listed, but only abnormal results are displayed)  Labs Reviewed  APTT - Abnormal; Notable for the following components:      Result Value   aPTT 37 (*)    All other components within normal limits  COMPREHENSIVE METABOLIC PANEL - Abnormal; Notable for the following components:   Glucose, Bld 139 (*)    All other components within normal limits  CBC  PROTIME-INR     IMAGES: CHEST - 2 VIEW 08/04/2018  COMPARISON:  PA and lateral chest x-ray of July 16, 2018  FINDINGS: The lungs are well-expanded. There is no focal infiltrate. There is no pleural effusion. There is density present in the posteromedial aspect of the  right lower lobe. The heart and pulmonary vascularity are normal. There are post CABG changes. There is calcification in the wall of the aortic arch. The bony thorax exhibits no acute abnormality.  IMPRESSION: Persistent right lower lobe airspace opacity consistent with a known mass. Previous CABG. Otherwise no acute cardiopulmonary abnormality.  Thoracic aortic atherosclerosis.     EKG: 08/04/2018: Sinus rhythm with occasional Premature ventricular complexes. Rate 64. Left axis deviation/ LBBB  CV: TTE 03/25/2016 (outside record, copy on pt chart): Occlusion: 1.  Moderate to severe concentric LVH with hypokinesis of the inferolateral wall.  Moderate decrease global wall motion.  Estimated EF 40 to 45%. 2.  Doppler evidence of grade 1 diastolic dysfunction. 3.  Mild left atrial enlargement. 4.  Mild to moderate mitral regurgitation. 5.  Trace tricuspid regurgitation. 6.  Aortic root is mildly dilated.  Stress test 02/15/2016 (outside record, copy on pt chart): Conclusion: EKG nondiagnostic for ischemia (due to LBBB).  Clinically negative for ischemia.    Past Medical History:  Diagnosis Date  . Anemia   . CAD (coronary artery disease)   . Constipation   . Gout   . Heart disease   . History of blood transfusion   . Hyperlipidemia   . Hypertension   . Myocardial infarction ([place])    MI in 2000  . Peripheral vascular disease ([place])  Abdominal Aortic Aneurysm  . Prostate cancer [facility]) Nov. 2012  . Ulcer     Past Surgical History:  Procedure Laterality Date  . ANAL FISSURECTOMY    . CORONARY ARTERY BYPASS GRAFT     2000  . EYE SURGERY Left Nov. 2013   Cataract  . EYE SURGERY Right Feb. 2014   Cataract  . PR VEIN BYPASS GRAFT,AORTO-FEM-POP  2000  . PROSTATE SURGERY  Nov. 2012  . ROBOT ASSISTED LAPAROSCOPIC RADICAL PROSTATECTOMY  08/08/2011   Procedure: ROBOTIC ASSISTED LAPAROSCOPIC RADICAL PROSTATECTOMY LEVEL 2;  Surgeon: Dutch Gray, MD;  Location: [facility] ORS;   Service: Urology;  Laterality: Bilateral;  with Bilateral Pelvic Lymphadenectomy   . SHOULDER SURGERY  1980'S    MEDICATIONS: . acetaminophen (TYLENOL) 650 MG CR tablet  . aspirin 81 MG tablet  . bisacodyl (DULCOLAX) 10 MG suppository  . colchicine (COLCRYS) 0.6 MG tablet  . folic acid (FOLVITE) 254 MCG tablet  . LORazepam (ATIVAN) 1 MG tablet  . metoprolol tartrate (LOPRESSOR) 50 MG tablet  . Multiple Vitamins-Minerals (MULTIVITAMIN WITH MINERALS) tablet  . niacin 500 MG tablet  . Omega-3 Fatty Acids (FISH OIL) 1200 MG CAPS  . omeprazole (PRILOSEC OTC) 20 MG tablet  . polyethylene glycol (MIRALAX / GLYCOLAX) packet  . pravastatin (PRAVACHOL) 10 MG tablet  . Psyllium (METAMUCIL FIBER PO)  . ramipril (ALTACE) 10 MG capsule  . triamterene-hydrochlorothiazide (DYAZIDE) 37.5-25 MG per capsule   No current facility-administered medications for this encounter.      Wynonia Musty [facility] Short Stay Center/Anesthesiology Phone [phone] 08/05/2018 10:03 AM

## 2018-08-06 ENCOUNTER — Ambulatory Visit (HOSPITAL_COMMUNITY): Payer: Medicare Other

## 2018-08-06 ENCOUNTER — Ambulatory Visit (HOSPITAL_COMMUNITY): Payer: Medicare Other | Admitting: Certified Registered Nurse Anesthetist

## 2018-08-06 ENCOUNTER — Ambulatory Visit (HOSPITAL_COMMUNITY)
Admission: RE | Admit: 2018-08-06 | Discharge: 2018-08-06 | Disposition: A | Discharge status: Home / Self Care | Payer: Medicare Other | Source: Ambulatory Visit | Attending: Thoracic Surgery (Cardiothoracic Vascular Surgery) | Admitting: Thoracic Surgery (Cardiothoracic Vascular Surgery)

## 2018-08-06 ENCOUNTER — Other Ambulatory Visit: Payer: Self-pay

## 2018-08-06 ENCOUNTER — Encounter (HOSPITAL_COMMUNITY): Payer: Self-pay

## 2018-08-06 ENCOUNTER — Encounter (HOSPITAL_COMMUNITY)
Admission: RE | Disposition: A | Discharge status: Home / Self Care | Payer: Self-pay | Source: Ambulatory Visit | Attending: Thoracic Surgery (Cardiothoracic Vascular Surgery)

## 2018-08-06 ENCOUNTER — Ambulatory Visit (HOSPITAL_COMMUNITY): Payer: Medicare Other | Admitting: Physician Assistant

## 2018-08-06 DIAGNOSIS — I739 Peripheral vascular disease, unspecified: Secondary | ICD-10-CM | POA: Insufficient documentation

## 2018-08-06 DIAGNOSIS — R590 Localized enlarged lymph nodes: Secondary | ICD-10-CM

## 2018-08-06 DIAGNOSIS — Z7982 Long term (current) use of aspirin: Secondary | ICD-10-CM | POA: Diagnosis not present

## 2018-08-06 DIAGNOSIS — I6523 Occlusion and stenosis of bilateral carotid arteries: Secondary | ICD-10-CM | POA: Diagnosis not present

## 2018-08-06 DIAGNOSIS — R918 Other nonspecific abnormal finding of lung field: Secondary | ICD-10-CM | POA: Diagnosis present

## 2018-08-06 DIAGNOSIS — I10 Essential (primary) hypertension: Secondary | ICD-10-CM | POA: Diagnosis not present

## 2018-08-06 DIAGNOSIS — Z8546 Personal history of malignant neoplasm of prostate: Secondary | ICD-10-CM | POA: Diagnosis not present

## 2018-08-06 DIAGNOSIS — C3411 Malignant neoplasm of upper lobe, right bronchus or lung: Secondary | ICD-10-CM | POA: Diagnosis not present

## 2018-08-06 DIAGNOSIS — Z87891 Personal history of nicotine dependence: Secondary | ICD-10-CM | POA: Insufficient documentation

## 2018-08-06 DIAGNOSIS — E785 Hyperlipidemia, unspecified: Secondary | ICD-10-CM | POA: Diagnosis not present

## 2018-08-06 DIAGNOSIS — I252 Old myocardial infarction: Secondary | ICD-10-CM | POA: Diagnosis not present

## 2018-08-06 DIAGNOSIS — Z951 Presence of aortocoronary bypass graft: Secondary | ICD-10-CM | POA: Insufficient documentation

## 2018-08-06 DIAGNOSIS — Z9889 Other specified postprocedural states: Secondary | ICD-10-CM

## 2018-08-06 DIAGNOSIS — I2510 Atherosclerotic heart disease of native coronary artery without angina pectoris: Secondary | ICD-10-CM | POA: Insufficient documentation

## 2018-08-06 DIAGNOSIS — M109 Gout, unspecified: Secondary | ICD-10-CM | POA: Insufficient documentation

## 2018-08-06 HISTORY — DX: Other nonspecific abnormal finding of lung field: R91.8

## 2018-08-06 HISTORY — PX: VIDEO BRONCHOSCOPY WITH ENDOBRONCHIAL ULTRASOUND: SHX6177

## 2018-08-06 SURGERY — BRONCHOSCOPY, WITH EBUS
Anesthesia: General | Site: Bronchus

## 2018-08-06 MED ORDER — PHENYLEPHRINE 40 MCG/ML (10ML) SYRINGE FOR IV PUSH (FOR BLOOD PRESSURE SUPPORT)
PREFILLED_SYRINGE | INTRAVENOUS | Status: AC
  Filled 2018-08-06: qty 10

## 2018-08-06 MED ORDER — LACTATED RINGERS IV SOLN
INTRAVENOUS | Status: DC
  Administered 2018-08-06: 14:00:00 via INTRAVENOUS

## 2018-08-06 MED ORDER — EPHEDRINE SULFATE-NACL 50-0.9 MG/10ML-% IV SOSY
PREFILLED_SYRINGE | INTRAVENOUS | Status: DC | PRN
  Administered 2018-08-06: 10 mg via INTRAVENOUS

## 2018-08-06 MED ORDER — ONDANSETRON HCL 4 MG/2ML IJ SOLN: 4.0000 mg | Freq: Once | INTRAMUSCULAR | Status: DC | PRN

## 2018-08-06 MED ORDER — DEXAMETHASONE SODIUM PHOSPHATE 10 MG/ML IJ SOLN
INTRAMUSCULAR | Status: AC
  Filled 2018-08-06: qty 1

## 2018-08-06 MED ORDER — EPHEDRINE 5 MG/ML INJ
INTRAVENOUS | Status: AC
  Filled 2018-08-06: qty 10

## 2018-08-06 MED ORDER — LIDOCAINE 2% (20 MG/ML) 5 ML SYRINGE
INTRAMUSCULAR | Status: AC
  Filled 2018-08-06: qty 5

## 2018-08-06 MED ORDER — ROCURONIUM BROMIDE 50 MG/5ML IV SOSY
PREFILLED_SYRINGE | INTRAVENOUS | Status: AC
  Filled 2018-08-06: qty 5

## 2018-08-06 MED ORDER — DEXAMETHASONE SODIUM PHOSPHATE 10 MG/ML IJ SOLN
INTRAMUSCULAR | Status: DC | PRN
  Administered 2018-08-06: 10 mg via INTRAVENOUS

## 2018-08-06 MED ORDER — PROPOFOL 10 MG/ML IV BOLUS
INTRAVENOUS | Status: AC
  Filled 2018-08-06: qty 20

## 2018-08-06 MED ORDER — ROCURONIUM BROMIDE 10 MG/ML (PF) SYRINGE
PREFILLED_SYRINGE | INTRAVENOUS | Status: DC | PRN
  Administered 2018-08-06: 40 mg via INTRAVENOUS
  Administered 2018-08-06: 10 mg via INTRAVENOUS

## 2018-08-06 MED ORDER — ONDANSETRON HCL 4 MG/2ML IJ SOLN
INTRAMUSCULAR | Status: AC
  Filled 2018-08-06: qty 2

## 2018-08-06 MED ORDER — SUGAMMADEX SODIUM 200 MG/2ML IV SOLN
INTRAVENOUS | Status: AC
  Filled 2018-08-06: qty 2

## 2018-08-06 MED ORDER — LIDOCAINE 2% (20 MG/ML) 5 ML SYRINGE
INTRAMUSCULAR | Status: DC | PRN
  Administered 2018-08-06: 20 mg via INTRAVENOUS

## 2018-08-06 MED ORDER — EPINEPHRINE PF 1 MG/ML IJ SOLN
INTRAMUSCULAR | Status: AC
  Filled 2018-08-06: qty 1

## 2018-08-06 MED ORDER — 0.9 % SODIUM CHLORIDE (POUR BTL) OPTIME
TOPICAL | Status: DC | PRN
  Administered 2018-08-06: 1000 mL

## 2018-08-06 MED ORDER — PHENYLEPHRINE 40 MCG/ML (10ML) SYRINGE FOR IV PUSH (FOR BLOOD PRESSURE SUPPORT)
PREFILLED_SYRINGE | INTRAVENOUS | Status: DC | PRN
  Administered 2018-08-06: 40 ug via INTRAVENOUS
  Administered 2018-08-06: 80 ug via INTRAVENOUS
  Administered 2018-08-06: 120 ug via INTRAVENOUS
  Administered 2018-08-06 (×2): 80 ug via INTRAVENOUS

## 2018-08-06 MED ORDER — HYDROMORPHONE HCL 1 MG/ML IJ SOLN: 0.2500 mg | INTRAMUSCULAR | Status: DC | PRN

## 2018-08-06 MED ORDER — MEPERIDINE HCL 50 MG/ML IJ SOLN: 6.2500 mg | INTRAMUSCULAR | Status: DC | PRN

## 2018-08-06 MED ORDER — FENTANYL CITRATE (PF) 250 MCG/5ML IJ SOLN
INTRAMUSCULAR | Status: DC | PRN
  Administered 2018-08-06: 50 ug via INTRAVENOUS

## 2018-08-06 MED ORDER — SUGAMMADEX SODIUM 200 MG/2ML IV SOLN
INTRAVENOUS | Status: DC | PRN
  Administered 2018-08-06: 200 mg via INTRAVENOUS

## 2018-08-06 MED ORDER — PROPOFOL 10 MG/ML IV BOLUS
INTRAVENOUS | Status: DC | PRN
  Administered 2018-08-06: 120 mg via INTRAVENOUS
  Administered 2018-08-06: 40 mg via INTRAVENOUS

## 2018-08-06 MED ORDER — EPINEPHRINE PF 1 MG/ML IJ SOLN
INTRAMUSCULAR | Status: DC | PRN
  Administered 2018-08-06: 1 mg via ENDOTRACHEOPULMONARY

## 2018-08-06 MED ORDER — ONDANSETRON HCL 4 MG/2ML IJ SOLN
INTRAMUSCULAR | Status: DC | PRN
  Administered 2018-08-06: 4 mg via INTRAVENOUS

## 2018-08-06 MED ORDER — FENTANYL CITRATE (PF) 250 MCG/5ML IJ SOLN
INTRAMUSCULAR | Status: AC
  Filled 2018-08-06: qty 5

## 2018-08-06 SURGICAL SUPPLY — 37 items
ADAPTER VALVE BIOPSY EBUS (MISCELLANEOUS) IMPLANT
ADPTR VALVE BIOPSY EBUS (MISCELLANEOUS) ×2
BRUSH CYTOL CELLEBRITY 1.5X140 (MISCELLANEOUS) ×1 IMPLANT
CANISTER SUCT 3000ML PPV (MISCELLANEOUS) ×2 IMPLANT
CONT SPEC 4OZ CLIKSEAL STRL BL (MISCELLANEOUS) ×3 IMPLANT
COVER BACK TABLE 60X90IN (DRAPES) ×2 IMPLANT
FILTER STRAW FLUID ASPIR (MISCELLANEOUS) ×1 IMPLANT
FORCEPS BIOP RJ4 1.8 (CUTTING FORCEPS) ×1 IMPLANT
FORCEPS RADIAL JAW LRG 4 PULM (INSTRUMENTS) IMPLANT
GAUZE SPONGE 4X4 12PLY STRL (GAUZE/BANDAGES/DRESSINGS) IMPLANT
GLOVE SURG SIGNA 7.5 PF LTX (GLOVE) ×2 IMPLANT
GOWN STRL REUS W/ TWL XL LVL3 (GOWN DISPOSABLE) ×1 IMPLANT
GOWN STRL REUS W/TWL XL LVL3 (GOWN DISPOSABLE) ×2
KIT CLEAN ENDO COMPLIANCE (KITS) ×4 IMPLANT
KIT TURNOVER KIT B (KITS) ×2 IMPLANT
MARKER SKIN DUAL TIP RULER LAB (MISCELLANEOUS) ×2 IMPLANT
NDL ASPIRATION VIZISHOT 19G (NEEDLE) IMPLANT
NDL BLUNT 18X1 FOR OR ONLY (NEEDLE) IMPLANT
NEEDLE ASPIRATION VIZISHOT 19G (NEEDLE) ×2 IMPLANT
NEEDLE BLUNT 18X1 FOR OR ONLY (NEEDLE) IMPLANT
NS IRRIG 1000ML POUR BTL (IV SOLUTION) ×2 IMPLANT
OIL SILICONE PENTAX (PARTS (SERVICE/REPAIRS)) ×2 IMPLANT
PAD ARMBOARD 7.5X6 YLW CONV (MISCELLANEOUS) ×3 IMPLANT
RADIAL JAW LRG 4 PULMONARY (INSTRUMENTS)
SYR 20CC LL (SYRINGE) ×2 IMPLANT
SYR 20ML ECCENTRIC (SYRINGE) ×3 IMPLANT
SYR 3ML LL SCALE MARK (SYRINGE) ×1 IMPLANT
SYR 5ML LL (SYRINGE) ×1 IMPLANT
SYR 5ML LUER SLIP (SYRINGE) ×1 IMPLANT
TOWEL GREEN STERILE (TOWEL DISPOSABLE) ×2 IMPLANT
TOWEL GREEN STERILE FF (TOWEL DISPOSABLE) ×2 IMPLANT
TRAP SPECIMEN MUCOUS 40CC (MISCELLANEOUS) ×2 IMPLANT
TUBE CONNECTING 20X1/4 (TUBING) ×2 IMPLANT
VALVE BIOPSY  SINGLE USE (MISCELLANEOUS) ×1
VALVE BIOPSY SINGLE USE (MISCELLANEOUS) ×1 IMPLANT
VALVE SUCTION BRONCHIO DISP (MISCELLANEOUS) ×2 IMPLANT
WATER STERILE IRR 1000ML POUR (IV SOLUTION) ×2 IMPLANT

## 2018-08-06 NOTE — Transfer of Care (Signed)
Immediate Anesthesia Transfer of Care Note  Patient: Tony [name]  Procedure(s) Performed: VIDEO BRONCHOSCOPY WITH ENDOBRONCHIAL ULTRASOUND (N/A Bronchus)  Patient Location: PACU  Anesthesia Type:General  Level of Consciousness: awake, alert , oriented and patient cooperative  Airway & Oxygen Therapy: Patient Spontanous Breathing and Patient connected to nasal cannula oxygen  Post-op Assessment: Report given to RN, Post -op Vital signs reviewed and stable and Patient moving all extremities  Post vital signs: Reviewed and stable  Last Vitals:  Vitals Value Taken Time  BP 159/126 08/06/2018  6:41 PM  Temp    Pulse 87 08/06/2018  6:41 PM  Resp 17 08/06/2018  6:42 PM  SpO2 94 % 08/06/2018  6:41 PM  Vitals shown include unvalidated device data.  Last Pain:  Vitals:   08/06/18 1353  TempSrc:   PainSc: 3       Patients Stated Pain Goal: 0 (77/93/90 3009)  Complications: No apparent anesthesia complications

## 2018-08-06 NOTE — Brief Op Note (Signed)
08/06/2018  6:41 PM  PATIENT:  Tony [name]  77 y.o. male  PRE-OPERATIVE DIAGNOSIS:  RLL MASS  POST-OPERATIVE DIAGNOSIS:  RLL MASS  PROCEDURE:  Procedure(s): VIDEO BRONCHOSCOPY WITH ENDOBRONCHIAL ULTRASOUND (N/A)  Brushings and transbronchial biopsies  SURGEON:  Surgeon(s) and Role:    * Melrose Nakayama, MD - Primary  PHYSICIAN ASSISTANT:   ASSISTANTS: none   ANESTHESIA:   general  EBL:  50 mL   BLOOD ADMINISTERED:none  DRAINS: none   LOCAL MEDICATIONS USED:  NONE  SPECIMEN:  Source of Specimen:  lymph node, RLL  DISPOSITION OF SPECIMEN:  PATHOLOGY  COUNTS:  NO endo  TOURNIQUET:  * No tourniquets in log *  DICTATION: .Other Dictation: Dictation Number -  PLAN OF CARE: Discharge to home after PACU  PATIENT DISPOSITION:  PACU - hemodynamically stable.   Delay start of Pharmacological VTE agent (>24hrs) due to surgical blood loss or risk of bleeding: not applicable

## 2018-08-06 NOTE — Anesthesia Procedure Notes (Signed)
Procedure Name: Intubation Date/Time: 08/06/2018 5:36 PM Performed by: Myna Bright, CRNA Pre-anesthesia Checklist: Patient identified, Emergency Drugs available, Suction available and Patient being monitored Patient Re-evaluated:Patient Re-evaluated prior to induction Oxygen Delivery Method: Circle System Utilized Preoxygenation: Pre-oxygenation with 100% oxygen Induction Type: IV induction Ventilation: Mask ventilation without difficulty Laryngoscope Size: Mac and 4 Grade View: Grade III Tube type: Oral Tube size: 8.5 mm Number of attempts: 1 Airway Equipment and Method: Stylet and Oral airway Placement Confirmation: ETT inserted through vocal cords under direct vision,  positive ETCO2 and breath sounds checked- equal and bilateral Secured at: 23 cm Tube secured with: Tape Dental Injury: Teeth and Oropharynx as per pre-operative assessment

## 2018-08-06 NOTE — Interval H&P Note (Signed)
History and Physical Interval Note:  08/06/2018 4:44 PM  Tony [name]  has presented today for surgery, with the diagnosis of RLL MASS  The various methods of treatment have been discussed with the patient and family. After consideration of risks, benefits and other options for treatment, the patient has consented to  Procedure(s): [place] (N/A) as a surgical intervention .  The patient's history has been reviewed, patient examined, no change in status, stable for surgery.  I have reviewed the patient's chart and labs.  Questions were answered to the patient's satisfaction.     Melrose Nakayama

## 2018-08-06 NOTE — Discharge Instructions (Addendum)
Do not drive or engage in heavy physical activity for 24 hours  You may resume normal activities tomorrow evening  You may cough up small amounts of blood over the next few days  You may use acetaminophen (Tylenol) if needed for discomfort  Call [phone] if you develop chest pain, shortness of breath, fever > 101 F or cough up more than 2 tablespoons of blood  Follow up as scheduled with Radiation Oncology and Oncology next week

## 2018-08-06 NOTE — Anesthesia Postprocedure Evaluation (Signed)
Anesthesia Post Note  Patient: Tony [name]  Procedure(s) Performed: VIDEO BRONCHOSCOPY WITH ENDOBRONCHIAL ULTRASOUND (N/A Bronchus)     Patient location during evaluation: PACU Anesthesia Type: General Level of consciousness: awake and alert, oriented and awake Pain management: pain level controlled Vital Signs Assessment: post-procedure vital signs reviewed and stable Respiratory status: spontaneous breathing, nonlabored ventilation and respiratory function stable Cardiovascular status: blood pressure returned to baseline and stable Postop Assessment: no apparent nausea or vomiting Anesthetic complications: no    Last Vitals:  Vitals:   08/06/18 1845 08/06/18 1925  BP: (!) 144/65 118/76  Pulse: 88 (!) 43  Resp: 15 19  Temp:  36.9 C  SpO2: 97% 99%    Last Pain:  Vitals:   08/06/18 1925  TempSrc:   PainSc: 0-No pain                 Catalina Gravel

## 2018-08-07 ENCOUNTER — Encounter (HOSPITAL_COMMUNITY): Payer: Self-pay | Admitting: Thoracic Surgery (Cardiothoracic Vascular Surgery)

## 2018-08-07 ENCOUNTER — Encounter: Payer: Self-pay | Admitting: *Deleted

## 2018-08-07 NOTE — Progress Notes (Signed)
[place] Psychosocial Distress Screening Clinical Social Work  Clinical Social Work was referred by distress screening protocol. The patient scored a 8 on the Psychosocial Distress Thermometer which indicates moderate distress. Clinical Social Worker contacted patient by phone to assess for distress and other psychosocial needs. Mr. Busic reported he was very anxious prior to biopsy, but now feels much better after completing biopsy and starting to plan for treatment.  He has no concerns at this time.  CSW reviewed support program information and encouraged patient to call with any questions or concerns.  ONCBCN DISTRESS SCREENING 07/30/2018  Screening Type Initial Screening  Distress experienced in past week (1-10) 8  Emotional problem type Nervousness/Anxiety  Spiritual/Religous concerns type Facing my mortality  Information Concerns Type Lack of info about diagnosis;Lack of info about treatment  Physical Problem type Pain;Constipation/diarrhea    Clinical Social Worker follow up needed: No.  If yes, follow up plan:  Gwinda Maine, LCSW

## 2018-08-07 NOTE — Progress Notes (Signed)
Oncology Nurse Navigator Documentation  Oncology Nurse Navigator Flowsheets 08/07/2018  Navigator Location [facility]  Navigator Encounter Type Other/I updated Dr. Sondra Come and Dr. Julien Nordmann of MRI Brain results.    Treatment Phase Abnormal Scans  Barriers/Navigation Needs Coordination of Care  Interventions Coordination of Care  Coordination of Care Other  Acuity Level 1  Time Spent with Patient 30

## 2018-08-07 NOTE — Op Note (Signed)
NAME: EDDRICK, DILONE MEDICAL RECORD [id number] ACCOUNT [id number] DATE OF BIRTH:[birth year] FACILITY: [facility] LOCATION: [facility] PHYSICIAN:Pride Gonzales C. Margel Joens, MD  OPERATIVE REPORT  DATE OF PROCEDURE:  08/06/2018  PREOPERATIVE DIAGNOSIS:  Right lower lobe mass with mediastinal adenopathy and bone lesions consistent with stage IV lung cancer.  POSTOPERATIVE DIAGNOSIS:  Right lower lobe mass with mediastinal adenopathy and bone lesions consistent with stage IV lung cancer.  PROCEDURE:   1.Bronchoscopy with brushings and transbronchial biopsies and 2.Endobronchial ultrasound with mediastinal lymph node aspirations.  SURGEON:  Modesto Charon, MD  ASSISTANT:  None.  ANESTHESIA:  General.  FINDINGS:  No definite endobronchial lesions seen.  Good aspirations from lymph node.  CLINICAL NOTE:  Mr. Hurrell is a 77 year old gentleman with a history of tobacco abuse who presented with right shoulder pain.  Workup has revealed a lung mass with adenopathy and likely bone metastases consistent with stage IV lung cancer.  He was  advised to undergo bronchoscopy and endobronchial ultrasound for diagnostic purposes.  The indications, risks, benefits, and alternatives were discussed in detail with the patient.  He understood and accepted the risks and agreed to proceed.  DESCRIPTION OF PROCEDURE:  The patient was brought to the operating room on 08/06/2018.  He had induction of general anesthesia.  Flexible fiberoptic bronchoscopy was performed via the endotracheal tube.  It revealed normal endobronchial anatomy with no  endobronchial lesions to the level of the subsegmental bronchi.  In the right basilar posterior-medial segmental bronchus there was some edema of the mucosa.  The bronchoscope was removed.  Endobronchial ultrasound scope was advanced.  There was a level 7 node along the right main stem bronchus on PET CT. This node was easily visualized with the ultrasound scope.  Aspirations of  the node were performed using a 19 gauge needle.  The  needle was advanced into the lymph node with ultrasound visualization.  Specimens were obtained, both with and without suction applied, 15 to 20 passes were made with the needle for each aspiration.  The initial aspirations were placed onto slides.  The  remainder were placed in the cell block.  The endobronchial ultrasound probe was removed.  The bronchoscope was replaced.  Biopsies were obtained from the posterior medial basilar bronchus as well as several other of the subsegmental bronchus to the right lower lobe.  Brushings were performed first, followed by biopsies.  There was some minor bleeding.   This was controlled with saline and dilute epinephrine.  All of the specimens were sent for permanent pathology.  Final inspection was made with the bronchoscope.  There was no ongoing bleeding.  The patient then was extubated in the operating room and  taken to the [facility] in good condition.  TN/NUANCE  D:08/06/2018 T:08/07/2018 JOB:[id number]

## 2018-08-11 ENCOUNTER — Other Ambulatory Visit: Payer: Self-pay | Admitting: Radiation Therapy

## 2018-08-11 ENCOUNTER — Ambulatory Visit
Admission: RE | Admit: 2018-08-11 | Discharge: 2018-08-11 | Disposition: A | Discharge status: Home / Self Care | Payer: Medicare Other | Source: Ambulatory Visit | Attending: Radiation Oncology | Admitting: Radiation Oncology

## 2018-08-11 DIAGNOSIS — C7931 Secondary malignant neoplasm of brain: Secondary | ICD-10-CM | POA: Insufficient documentation

## 2018-08-11 DIAGNOSIS — C3431 Malignant neoplasm of lower lobe, right bronchus or lung: Secondary | ICD-10-CM | POA: Diagnosis not present

## 2018-08-11 DIAGNOSIS — C7951 Secondary malignant neoplasm of bone: Secondary | ICD-10-CM | POA: Diagnosis present

## 2018-08-11 DIAGNOSIS — C7949 Secondary malignant neoplasm of other parts of nervous system: Principal | ICD-10-CM

## 2018-08-11 NOTE — Progress Notes (Signed)
  Radiation Oncology         [phone] ________________________________  Name: Tony [name] MRN: [id number]  Date: 08/11/2018  DOB: 10-26-1940  SIMULATION AND TREATMENT PLANNING NOTE    ICD-10-CM   1. 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  Identity was confirmed.  All relevant records and images related to the planned course of therapy were reviewed.  The patient freely provided informed written consent to proceed with treatment after reviewing the details related to the planned course of therapy. The consent form was witnessed and verified by the simulation staff.  Then, the patient was set-up in a stable reproducible  supine position for radiation therapy.  CT images were obtained.  Surface markings were placed.  The CT images were loaded into the planning software.  Then the target and avoidance structures were contoured.  Treatment planning then occurred.  The radiation prescription was entered and confirmed.  Then, I designed and supervised the construction of a total of 6 medically necessary complex treatment devices.  I have requested : Isodose Plan.  I have ordered:CBC  PLAN:  The patient will receive 30 Gy in 10 fractions directed at the left sided anterior C1 arch lytic metastasis and the right glenoid lesion metastasis.  -----------------------------------  Blair Promise, PhD, MD

## 2018-08-13 ENCOUNTER — Inpatient Hospital Stay: Payer: Medicare Other

## 2018-08-13 ENCOUNTER — Encounter: Payer: Self-pay | Admitting: Internal Medicine

## 2018-08-13 ENCOUNTER — Other Ambulatory Visit: Payer: Self-pay | Admitting: *Deleted

## 2018-08-13 ENCOUNTER — Inpatient Hospital Stay (HOSPITAL_BASED_OUTPATIENT_CLINIC_OR_DEPARTMENT_OTHER): Payer: Medicare Other | Admitting: Oncology

## 2018-08-13 ENCOUNTER — Encounter: Payer: Self-pay | Admitting: Oncology

## 2018-08-13 VITALS — BP 150/67 | HR 59 | Temp 98.0°F | Resp 18 | Ht 69.0 in | Wt 174.2 lb

## 2018-08-13 DIAGNOSIS — C781 Secondary malignant neoplasm of mediastinum: Secondary | ICD-10-CM

## 2018-08-13 DIAGNOSIS — C771 Secondary and unspecified malignant neoplasm of intrathoracic lymph nodes: Secondary | ICD-10-CM

## 2018-08-13 DIAGNOSIS — C7970 Secondary malignant neoplasm of unspecified adrenal gland: Secondary | ICD-10-CM

## 2018-08-13 DIAGNOSIS — C3491 Malignant neoplasm of unspecified part of right bronchus or lung: Secondary | ICD-10-CM

## 2018-08-13 DIAGNOSIS — C3431 Malignant neoplasm of lower lobe, right bronchus or lung: Secondary | ICD-10-CM

## 2018-08-13 DIAGNOSIS — M542 Cervicalgia: Secondary | ICD-10-CM

## 2018-08-13 DIAGNOSIS — C7951 Secondary malignant neoplasm of bone: Secondary | ICD-10-CM

## 2018-08-13 DIAGNOSIS — C7931 Secondary malignant neoplasm of brain: Secondary | ICD-10-CM

## 2018-08-13 DIAGNOSIS — I2510 Atherosclerotic heart disease of native coronary artery without angina pectoris: Secondary | ICD-10-CM

## 2018-08-13 DIAGNOSIS — M25511 Pain in right shoulder: Secondary | ICD-10-CM

## 2018-08-13 DIAGNOSIS — I10 Essential (primary) hypertension: Secondary | ICD-10-CM

## 2018-08-13 NOTE — Progress Notes (Signed)
Cancer conference 08/13/18 discussion documented for communication purpose. Patient was not in attendance nor seen by physician.

## 2018-08-13 NOTE — Progress Notes (Signed)
[facility] OFFICE PROGRESS NOTE  [name], MD [place] [address]  DIAGNOSIS: Stage IV non-small cell lung cancer, adenocarcinoma who presented with a large right lower lobe lung mass in addition to metastatic disease to the mediastinal lymph nodes, adrenal glands, and bone involving the right shoulder, lumbar fine, and pelvic bone.  PRIOR THERAPY: None  CURRENT THERAPY: The patient is scheduled to begin palliative radiation to the right neck and shoulder.  INTERVAL HISTORY: ROMMIE DUNN 77 y.o. male returns for routine follow-up visit accompanied by his wife.  The patient is feeling fine today and has no specific complaints except for ongoing right shoulder and neck pain.  He is scheduled to begin radiation to this area early next week.  He is taking Tylenol arthritis for pain which is helpful.  Denies back and hip pain.  Denies fevers and chills.  Denies chest pain, shortness of breath, cough, hemoptysis.  Denies nausea, vomiting, diarrhea.  He has his baseline constipation uses MiraLAX which has been effective.  He reports a fair appetite but has lost a few pounds since his last visit.  He had a recent MRI of the brain and biopsy and is here to discuss the results.  MEDICAL HISTORY: Past Medical History:  Diagnosis Date  . Anemia   . CAD (coronary artery disease)   . Constipation   . Gout   . Heart disease   . History of blood transfusion   . Hyperlipidemia   . Hypertension   . Mass of lower lobe of right lung   . Myocardial infarction ([place])    MI in 2000  . Peripheral vascular disease ([facility])    Abdominal Aortic Aneurysm  . Prostate cancer [facility]) Nov. 2012  . Ulcer     ALLERGIES:  has No Known Allergies.  MEDICATIONS:  Current Outpatient Medications  Medication Sig Dispense Refill  . acetaminophen (TYLENOL) 650 MG CR tablet Take 650 mg by mouth daily as needed for pain.     Marland Kitchen aspirin 81 MG tablet Take 81 mg by mouth every  evening.     . bisacodyl (DULCOLAX) 10 MG suppository Place 1 suppository (10 mg total) rectally as needed for moderate constipation. 12 suppository 0  . colchicine (COLCRYS) 0.6 MG tablet Take 0.6 mg by mouth as needed (gout flare).     . folic acid (FOLVITE) 962 MCG tablet Take 800 mcg by mouth daily.     . metoprolol tartrate (LOPRESSOR) 50 MG tablet Take 50 mg by mouth 2 (two) times daily.    . Multiple Vitamins-Minerals (MULTIVITAMIN WITH MINERALS) tablet Take 1 tablet by mouth daily.    . niacin 500 MG tablet Take 500 mg by mouth 2 (two) times daily.    . Omega-3 Fatty Acids (FISH OIL) 1200 MG CAPS Take 1,200 mg by mouth 2 (two) times daily.    Marland Kitchen omeprazole (PRILOSEC OTC) 20 MG tablet Take 10 mg by mouth 2 (two) times a week.    . polyethylene glycol (MIRALAX / GLYCOLAX) packet Take 17 g by mouth daily as needed for mild constipation.     . pravastatin (PRAVACHOL) 10 MG tablet Take 10 mg by mouth every evening.     . Psyllium (METAMUCIL FIBER PO) Take 1 Dose by mouth daily.    . ramipril (ALTACE) 10 MG capsule Take 10 mg by mouth every morning.     . triamterene-hydrochlorothiazide (DYAZIDE) 37.5-25 MG per capsule Take 0.5 capsules by mouth every morning.  No current facility-administered medications for this visit.     SURGICAL HISTORY:  Past Surgical History:  Procedure Laterality Date  . ANAL FISSURECTOMY    . CORONARY ARTERY BYPASS GRAFT     2000  . EYE SURGERY Left Nov. 2013   Cataract  . EYE SURGERY Right Feb. 2014   Cataract  . PR VEIN BYPASS GRAFT,AORTO-FEM-POP  2000  . PROSTATE SURGERY  Nov. 2012  . ROBOT ASSISTED LAPAROSCOPIC RADICAL PROSTATECTOMY  08/08/2011   Procedure: ROBOTIC ASSISTED LAPAROSCOPIC RADICAL PROSTATECTOMY LEVEL 2;  Surgeon: Dutch Gray, MD;  Location: [facility];  Service: Urology;  Laterality: Bilateral;  with Bilateral Pelvic Lymphadenectomy   . SHOULDER SURGERY  1980'S  . VIDEO BRONCHOSCOPY WITH ENDOBRONCHIAL ULTRASOUND N/A 08/06/2018   Procedure:  VIDEO BRONCHOSCOPY WITH ENDOBRONCHIAL ULTRASOUND;  Surgeon: Melrose Nakayama, MD;  Location: [facility];  Service: Thoracic;  Laterality: N/A;    REVIEW OF SYSTEMS:   Review of Systems  Constitutional: Negative for appetite change, chills, fatigue, fever.  Positive for weight loss. HENT:   Negative for mouth sores, nosebleeds, sore throat and trouble swallowing.   Eyes: Negative for eye problems and icterus.  Respiratory: Negative for cough, hemoptysis, shortness of breath and wheezing.   Cardiovascular: Negative for chest pain and leg swelling.  Gastrointestinal: Negative for abdominal pain, diarrhea, nausea and vomiting.  Positive for constipation. Genitourinary: Negative for bladder incontinence, difficulty urinating, dysuria, frequency and hematuria.   Musculoskeletal: Negative for back pain, gait problem, neck pain and neck stiffness.  Positive for right neck and shoulder pain. Skin: Negative for itching and rash.  Neurological: Negative for dizziness, extremity weakness, gait problem, headaches, light-headedness and seizures.  Hematological: Negative for adenopathy. Does not bruise/bleed easily.  Psychiatric/Behavioral: Negative for confusion, depression and sleep disturbance. The patient is not nervous/anxious.     PHYSICAL EXAMINATION:  Blood pressure (!) 150/67, pulse (!) 59, temperature 98 F (36.7 C), temperature source Oral, resp. rate 18, height 5\' 9"  (1.753 m), weight 174 lb 3.2 oz (79 kg), SpO2 99 %.  ECOG PERFORMANCE STATUS: 1 - Symptomatic but completely ambulatory  Physical Exam  Constitutional: Oriented to person, place, and time and well-developed, well-nourished, and in no distress. No distress.  HENT:  Head: Normocephalic and atraumatic.  Mouth/Throat: Oropharynx is clear and moist. No oropharyngeal exudate.  Eyes: Conjunctivae are normal. Right eye exhibits no discharge. Left eye exhibits no discharge. No scleral icterus.  Neck: Normal range of motion. Neck  supple.  Cardiovascular: Normal rate, regular rhythm, normal heart sounds and intact distal pulses.   Pulmonary/Chest: Effort normal and breath sounds normal. No respiratory distress. No wheezes. No rales.  Abdominal: Soft. Bowel sounds are normal. Exhibits no distension and no mass. There is no tenderness.  Musculoskeletal: Normal range of motion. Exhibits no edema.  Lymphadenopathy:    No cervical adenopathy.  Neurological: Alert and oriented to person, place, and time. Exhibits normal muscle tone. Gait normal. Coordination normal.  Skin: Skin is warm and dry. No rash noted. Not diaphoretic. No erythema. No pallor.  Psychiatric: Mood, memory and judgment normal.  Vitals reviewed.  LABORATORY DATA: Lab Results  Component Value Date   WBC 6.4 08/04/2018   HGB 13.8 08/04/2018   HCT 43.2 08/04/2018   MCV 99.5 08/04/2018   PLT 202 08/04/2018      Chemistry      Component Value Date/Time   NA 136 08/04/2018 1441   NA 139 08/10/2012 1328   K 3.8 08/04/2018 1441   K  4.1 08/10/2012 1328   CL 102 08/04/2018 1441   CL 105 08/10/2012 1328   CO2 26 08/04/2018 1441   CO2 29 08/10/2012 1328   BUN 12 08/04/2018 1441   BUN 18.0 08/10/2012 1328   CREATININE 0.96 08/04/2018 1441   CREATININE 1.02 07/30/2018 1338   CREATININE 1.1 08/10/2012 1328      Component Value Date/Time   CALCIUM 9.4 08/04/2018 1441   CALCIUM 9.0 08/10/2012 1328   ALKPHOS 94 08/04/2018 1441   ALKPHOS 144 08/10/2012 1328   AST 21 08/04/2018 1441   AST 15 07/30/2018 1338   AST 42 (H) 08/10/2012 1328   ALT 14 08/04/2018 1441   ALT 9 07/30/2018 1338   ALT 55 08/10/2012 1328   BILITOT 0.3 08/04/2018 1441   BILITOT 0.6 07/30/2018 1338   BILITOT 0.91 08/10/2012 1328       RADIOGRAPHIC STUDIES:  Dg Chest 2 View  Result Date: 08/05/2018 CLINICAL DATA:  Preoperative examination prior to lung biopsy of a right lower lobe mass. History of coronary artery disease and CABG. Previous MI. EXAM: CHEST - 2 VIEW  COMPARISON:  PA and lateral chest x-ray of July 16, 2018 FINDINGS: The lungs are well-expanded. There is no focal infiltrate. There is no pleural effusion. There is density present in the posteromedial aspect of the right lower lobe. The heart and pulmonary vascularity are normal. There are post CABG changes. There is calcification in the wall of the aortic arch. The bony thorax exhibits no acute abnormality. IMPRESSION: Persistent right lower lobe airspace opacity consistent with a known mass. Previous CABG. Otherwise no acute cardiopulmonary abnormality. Thoracic aortic atherosclerosis. Electronically Signed   By: David  Martinique M.D.   On: 08/05/2018 07:03   Ct Chest W Contrast  Result Date: 07/21/2018 CLINICAL DATA:  Abnormal chest x-ray EXAM: CT CHEST WITH CONTRAST TECHNIQUE: Multidetector CT imaging of the chest was performed during intravenous contrast administration. CONTRAST:  34mL ISOVUE-300 IOPAMIDOL (ISOVUE-300) INJECTION 61% COMPARISON:  07/16/2018 chest x-ray, CT of the abdomen and pelvis from 06/12/2011 FINDINGS: Cardiovascular: Atherosclerotic calcifications are noted of the thoracic aorta. No aneurysmal dilatation is noted. Changes of prior coronary bypass grafting are noted. Native coronary calcifications are seen. The pulmonary artery as visualized is within normal limits although not timed for embolus evaluation. Mediastinum/Nodes: The esophagus is within normal limits. The thoracic inlet is unremarkable. 18 mm subcarinal lymph node is noted best seen on image number 77 of series 2 with some adjacent smaller lymph nodes seen. No significant hilar adenopathy is identified. Lungs/Pleura: In the medial aspect of the right lower lobe corresponding to the area of abnormality on prior CT examination there is a 3.6 x 3.1 cm soft tissue mass lesion identified extending into the as ago esophageal recess. This is consistent with a primary pulmonary neoplasm till proven otherwise. Attenuation of 1 of  the branches of the right lower lobe bronchial tree is noted medially. No sizable effusion is seen. No other focal parenchymal abnormality is noted within the lungs. Upper Abdomen: Liver demonstrates scattered hypodensities. Some of these are stable from a prior CT examination dated 06/12/2011 and likely benign in etiology. Some of the smaller hypodensities likely represent cysts. Scattered cysts are noted within the kidneys. No renal calculi or obstructive changes are seen. There is new fullness within the right adrenal gland measuring approximately 2.6 by 1.3 cm in greatest dimension. This was not seen on the prior CT examination. Similar findings but to a lesser degree are noted within the  left adrenal gland also not present on the prior CT examination. Given the changes within the right lower lobe these are suspicious for metastatic lesions. No other focal abnormality in the upper abdomen is seen. Musculoskeletal: Degenerative changes of the thoracic spine are noted. Mildly expansile lesion is noted within the spinous process and lamina of L1. Some endplate changes are noted inferiorly at L1 which were not seen on the prior exam and also somewhat suspicious for metastatic disease. IMPRESSION: Soft tissue mass lesion in the medial aspect of the right lower lobe as described consistent with primary pulmonary neoplasm till proven otherwise. Associated subcarinal adenopathy is noted. Tissue sampling and PET-CT are recommended for further workup. Expansile lytic lesion within the spinous process and lamina of L1 as well as some endplate irregularity inferiorly at L1 consistent with metastatic disease. New lesions within the adrenal glands bilaterally suspicious for metastatic disease given the lung findings. These can also be evaluated with PET-CT. Chronic appearing changes as described above. Aortic Atherosclerosis (ICD10-I70.0). These results will be called to the ordering clinician or representative by the  Radiologist Assistant, and communication documented in the PACS or zVision Dashboard. Electronically Signed   By: Inez Catalina M.D.   On: 07/21/2018 09:09   Mr Jeri Cos MG Contrast  Result Date: 08/02/2018 CLINICAL DATA:  Initial evaluation for non-small cell lung cancer, staging. EXAM: MRI HEAD WITHOUT AND WITH CONTRAST TECHNIQUE: Multiplanar, multiecho pulse sequences of the brain and surrounding structures were obtained without and with intravenous contrast. CONTRAST:  80 cc of Gadavist. COMPARISON:  None available. FINDINGS: Brain: Generalized age-related cerebral atrophy. Minimal chronic microvascular ischemic changes present within the periventricular and deep white matter both cerebral hemispheres. Probable tiny remote lacunar infarct noted within the right thalamus. No evidence for acute infarct. No encephalomalacia to suggest chronic cortical infarction. No foci of susceptibility artifact to suggest acute or chronic intracranial hemorrhage. Subtle 4 mm focus of nodular enhancement seen within the right cerebellar hemisphere, suspicious for possible small metastasis (series 11, image 11). Additional tiny 3 mm nodular focus of enhancement seen slightly more inferiorly within the right cerebellum (series 11, image 10). Punctate focus of enhancement at the central cerebellar vermis may reflect a third small lesion (series 11, image 15). No other definite intracranial metastases or other foci of abnormal enhancement identified. No other mass lesion. No midline shift or mass effect. No hydrocephalus. No extra-axial fluid collection. Pituitary gland within normal limits. Midline structures intact and normal. Vascular: Major intracranial vascular flow voids maintained. Skull and upper cervical spine: Abnormal marrow replacing lesion seen involving the left lateral mass of C1 (series 11, image 2), most consistent with an osseous metastasis. No other focal marrow replacing lesions identified. Scalp soft tissues  unremarkable. Sinuses/Orbits: Patient status post ocular lens replacement bilaterally. Paranasal sinuses are clear. No mastoid effusion. Inner ear structures grossly within normal limits. Other: None. IMPRESSION: 1. Three total nodular foci of enhancement measuring up to 4 mm involving the central and right cerebellum as above, highly suspicious for small intracranial metastases. No significant edema or other complication at this time. 2. Osseous metastasis involving the left lateral mass of C1. 3. No other MRI evidence for intracranial metastatic disease. No other acute intracranial abnormality. Electronically Signed   By: Jeannine Boga M.D.   On: 08/02/2018 04:16   Nm Pet Image Initial (pi) Skull Base To Thigh  Result Date: 07/27/2018 CLINICAL DATA:  Initial treatment strategy for right lower lobe lung mass. EXAM: NUCLEAR MEDICINE PET SKULL  BASE TO THIGH TECHNIQUE: 8.9 mCi F-18 FDG was injected intravenously. Full-ring PET imaging was performed from the skull base to thigh after the radiotracer. CT data was obtained and used for attenuation correction and anatomic localization. Fasting blood glucose: 95 mg/dl COMPARISON:  Chest CT 07/20/2018 FINDINGS: Mediastinal blood pool activity: SUV max 2.6 NECK: No areas of abnormal hypermetabolism. Incidental CT findings: Cerebral and cerebellar atrophy. No cervical adenopathy. Bilateral carotid atherosclerosis. CHEST: Central right lower lobe hypermetabolic lung mass, which measures 4.2 x 3.8 cm and a S.U.V. max of 16.1 on image 40/8. Adjacent adenopathy within the azygoesophageal recess measures 2.2 x 1.5 cm and a S.U.V. max of 12.3 on image 68/4. Incidental CT findings: Median sternotomy for CABG. Other chest findings deferred to recent diagnostic CT. ABDOMEN/PELVIS: Right adrenal nodularity and hypermetabolism. 2.2 cm and a S.U.V. max of 11.6. There is more mild left adrenal hypermetabolism and minimal nodularity, including on a S.U.V. max of 3.6. Anal  hypermetabolism is without well-defined CT correlate. This measures a S.U.V. max of 13.1 including on image 186/4. Incidental CT findings: Infrarenal abdominal aortic ectasia at 3.2 cm. Bilateral renal vascular calcifications. Low-density bilateral renal lesions are likely cysts. Extensive colonic diverticulosis. Prostatectomy. SKELETON: Multifocal osseous metastasis. A left-sided anterior C1 arch lytic lesion measures 2.9 cm and a S.U.V. max of 14.8 on image 18/4. Hypermetabolic right glenoid lesion of a S.U.V. max of 15.3. L1 lesion was detailed on prior diagnostic CT. There is also a small lesion in the left iliac wing including on image 152/4. Incidental CT findings: none IMPRESSION: 1. 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 Metastatic disease to the mediastinum, right (and likely left) adrenal glands, and bones as detailed above. 2. Anal hypermetabolism warrants physical exam correlation to exclude synchronous polyp or mass. Electronically Signed   By: Abigail Miyamoto M.D.   On: 07/27/2018 10:31   Dg Chest Port 1 View  Result Date: 08/06/2018 CLINICAL DATA:  Status post bronchoscopy EXAM: PORTABLE CHEST 1 VIEW COMPARISON:  08/04/2018 FINDINGS: Cardiac shadow is stable. Postsurgical changes are again seen. Fullness in the right hilum is again identified and stable consistent with the known right-sided lung mass. The lungs are well aerated. No pneumothorax is noted following bronchoscopy. No bony abnormality is seen. IMPRESSION: No post bronchoscopy pneumothorax is noted. Electronically Signed   By: Inez Catalina M.D.   On: 08/06/2018 19:50   PATHOLOGY:  Diagnosis FINE NEEDLE ASPIRATION, ENDOSCOPIC SPECIMEN A, EBUS LEVEL 7 LYMPH NODE (SPECIMEN 1 OF 2, COLLECTED ON 08/06/2018): MALIGNANT CELLS CONSISTENT WITH ADENOCARCINOMA.  Diagnosis BRONCHIAL BRUSHING SPECIMEN B, RIGHT LOWER LOBE (SPECIMEN 2 OF 2 COLLECTED 08/06/18): MALIGNANT CELLS CONSISTENT WITH  ADENOCARCINOMA. Gillie Manners MD Pathologist, Electronic Signature (Case signed 08/11/2018)  ASSESSMENT/PLAN:  Adenocarcinoma, lung, right [facility]) This is a very pleasant 77 year old white male with stage IV non-small cell carcinoma, adenocarcinoma who presented with large right lower lobe lung mass in addition to metastatic disease to the mediastinal lymph nodes as well as adrenal glands and bone disease involving the right shoulder, lumbar spine and pelvic bones. The patient had a recent MRI of the brain and biopsy of his lung lesion and is here to discuss the results.  The patient was seen with Dr. Julien Nordmann.  MRI of the brain results were discussed with the patient and his wife.  MRI showed several small areas likely consistent with metastatic disease.  The patient is already scheduled for an MRI of the brain with SRS protocol.  Recommend  for the patient to undergo the MRI as scheduled and pending the results, he will be considered for SRS to the brain lesions.  He will follow-up with Dr. Sondra Come for this.  Biopsy results were discussed with the patient which were consistent with non-small cell lung cancer, adenocarcinoma.  We discussed his diagnosis, prognosis, and treatment options.  We discussed with the patient that we did not have enough tissue to send for PDL 1 testing or molecular studies.  The patient will be sent back today for guardant 360 testing and pending the results, will have a more detailed discussion of treatment including targeted therapy if there are actionable mutations versus chemotherapy plus immunotherapy versus a referral for palliative care/hospice.  He will return in 2 weeks for more detailed discussion of his treatment options, we have the guardant 360 results.  The patient will undergo palliative radiation to the right shoulder as scheduled.  For the suspicious anal lesion noted on the PET scan, he will keep his follow-up with Dr. Cristina Gong as scheduled.  The patient was  advised to call immediately if he has any concerning symptoms in the interval. The patient voices understanding of current disease status and treatment options and is in agreement with the current care plan.  All questions were answered. The patient knows to call the clinic with any problems, questions or concerns. We can certainly see the patient much sooner if necessary.   Orders Placed This Encounter  Procedures  . Guardant 360    Standing Status:   Future    Number of Occurrences:   1    Standing Expiration Date:   08/13/2019     Mikey Bussing, DNP, AGPCNP-BC, AOCNP 08/13/18   ADDENDUM: Hematology/Oncology Attending: I had a face-to-face encounter with the patient today.  I recommended his care plan.  This is a very pleasant 77 years old white male recently diagnosed with stage IV non-small cell lung cancer presented with large right lower lobe lung mass in addition to mediastinal lymphadenopathy as well as metastatic bone disease.  He was also found on recent MRI of the brain to have 3 small brain lesions.  He is scheduled to have repeat MRI of the brain with SRS protocol.  He is considered for stereotactic radiotherapy to the metastatic brain lesion.  The patient is also scheduled to start palliative radiotherapy to the right shoulder metastatic bone disease.  He had bronchoscopy with endobronchial ultrasound and biopsies under the care of Dr. Roxan Hockey that was consistent with primary lung adenocarcinoma but there was insufficient material for molecular studies and PDL 1 expression.  I discussed the biopsy results as well as the pathology report with the patient and his wife and daughter. I recommended for the patient to proceed with the palliative radiotherapy as a schedule next week. I will send a blood sample to Guardant 360 for molecular studies.  I will see the patient back for follow-up visit in 2 weeks for evaluation and more detailed discussion of his treatment options based on  the molecular studies by Guardant 360. For pain management he will continue with the current pain medication for now until improvement after the palliative radiotherapy. The patient was advised to call immediately if he has any concerning symptoms in the interval. Disclaimer: This note was dictated with voice recognition software. Similar sounding words can inadvertently be transcribed and may be missed upon review. Eilleen Kempf, MD 08/14/18

## 2018-08-14 DIAGNOSIS — C3491 Malignant neoplasm of unspecified part of right bronchus or lung: Secondary | ICD-10-CM | POA: Insufficient documentation

## 2018-08-14 NOTE — Assessment & Plan Note (Signed)
This is a very pleasant 77 year old white male with stage IV non-small cell carcinoma, adenocarcinoma who presented with large right lower lobe lung mass in addition to metastatic disease to the mediastinal lymph nodes as well as adrenal glands and bone disease involving the right shoulder, lumbar spine and pelvic bones. The patient had a recent MRI of the brain and biopsy of his lung lesion and is here to discuss the results.  The patient was seen with Dr. Julien Nordmann.  MRI of the brain results were discussed with the patient and his wife.  MRI showed several small areas likely consistent with metastatic disease.  The patient is already scheduled for an MRI of the brain with SRS protocol.  Recommend for the patient to undergo the MRI as scheduled and pending the results, he will be considered for SRS to the brain lesions.  He will follow-up with Dr. Sondra Come for this.  Biopsy results were discussed with the patient which were consistent with non-small cell lung cancer, adenocarcinoma.  We discussed his diagnosis, prognosis, and treatment options.  We discussed with the patient that we did not have enough tissue to send for PDL 1 testing or molecular studies.  The patient will be sent back today for guardant 360 testing and pending the results, will have a more detailed discussion of treatment including targeted therapy if there are actionable mutations versus chemotherapy plus immunotherapy versus a referral for palliative care/hospice.  He will return in 2 weeks for more detailed discussion of his treatment options, we have the guardant 360 results.  The patient will undergo palliative radiation to the right shoulder as scheduled.  For the suspicious anal lesion noted on the PET scan, he will keep his follow-up with Dr. Cristina Gong as scheduled.  The patient was advised to call immediately if he has any concerning symptoms in the interval. The patient voices understanding of current disease status and treatment  options and is in agreement with the current care plan.  All questions were answered. The patient knows to call the clinic with any problems, questions or concerns. We can certainly see the patient much sooner if necessary.

## 2018-08-17 ENCOUNTER — Ambulatory Visit (INDEPENDENT_AMBULATORY_CARE_PROVIDER_SITE_OTHER)
Admission: RE | Admit: 2018-08-17 | Discharge: 2018-08-17 | Disposition: A | Discharge status: Home / Self Care | Payer: Medicare Other | Source: Ambulatory Visit | Attending: Family Medicine | Admitting: Family Medicine

## 2018-08-17 ENCOUNTER — Ambulatory Visit
Admission: RE | Admit: 2018-08-17 | Discharge: 2018-08-17 | Disposition: A | Discharge status: Home / Self Care | Payer: Medicare Other | Source: Ambulatory Visit | Attending: Radiation Oncology | Admitting: Radiation Oncology

## 2018-08-17 ENCOUNTER — Encounter: Payer: Self-pay | Admitting: Family

## 2018-08-17 ENCOUNTER — Ambulatory Visit (HOSPITAL_COMMUNITY)
Admission: RE | Admit: 2018-08-17 | Discharge: 2018-08-17 | Disposition: A | Discharge status: Home / Self Care | Payer: Medicare Other | Source: Ambulatory Visit | Attending: Family Medicine | Admitting: Family Medicine

## 2018-08-17 ENCOUNTER — Ambulatory Visit: Payer: Medicare Other | Admitting: Family

## 2018-08-17 ENCOUNTER — Other Ambulatory Visit: Payer: Self-pay

## 2018-08-17 VITALS — BP 157/80 | HR 51 | Temp 97.1°F | Resp 20 | Ht 69.0 in | Wt 174.6 lb

## 2018-08-17 DIAGNOSIS — I714 Abdominal aortic aneurysm, without rupture, unspecified: Secondary | ICD-10-CM

## 2018-08-17 DIAGNOSIS — R0989 Other specified symptoms and signs involving the circulatory and respiratory systems: Secondary | ICD-10-CM | POA: Diagnosis not present

## 2018-08-17 DIAGNOSIS — C3491 Malignant neoplasm of unspecified part of right bronchus or lung: Secondary | ICD-10-CM

## 2018-08-17 DIAGNOSIS — Z87891 Personal history of nicotine dependence: Secondary | ICD-10-CM

## 2018-08-17 DIAGNOSIS — I779 Disorder of arteries and arterioles, unspecified: Secondary | ICD-10-CM | POA: Diagnosis not present

## 2018-08-17 DIAGNOSIS — C7951 Secondary malignant neoplasm of bone: Secondary | ICD-10-CM | POA: Diagnosis not present

## 2018-08-17 NOTE — Progress Notes (Signed)
  Radiation Oncology         [phone] ________________________________  Name: Tony SCARLATA MRN: [id number]  Date: 08/17/2018  DOB: April 20, 1941  Simulation Verification Note    ICD-10-CM   1. 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 The clinical setup was verified. Then port films were obtained and uploaded to the radiation oncology medical record software.  The treatment beams were carefully compared against the planned radiation fields. The position location and shape of the radiation fields was reviewed. They targeted volume of tissue appears to be appropriately covered by the radiation beams. Organs at risk appear to be excluded as planned.  Based on my personal review, I approved the simulation verification. The patient's treatment will proceed as planned.  -----------------------------------  Blair Promise, PhD, MD This document serves as a record of services personally performed by Gery Pray, MD. It was created on his behalf by Mary-Margaret Loma Messing, a trained medical scribe. The creation of this record is based on the scribe's personal observations and the provider's statements to them. This document has been checked and approved by the attending provider.

## 2018-08-17 NOTE — Patient Instructions (Signed)
Before your next abdominal ultrasound:  Avoid gas forming foods and beverages the day before the test.   Take two Extra-Strength Gas-X capsules at bedtime the night before the test. Take another two Extra-Strength Gas-X capsules in the middle of the night if you get up to the restroom, if not, first thing in the morning with water.  Do not chew gum.      Abdominal Aortic Aneurysm Blood pumps away from the heart through tubes (blood vessels) called arteries. Aneurysms are weak or damaged places in the wall of an artery. It bulges out like a balloon. An abdominal aortic aneurysm happens in the main artery of the body (aorta). It can burst or tear, causing bleeding inside the body. This is an emergency. It needs treatment right away. What are the causes? The exact cause is unknown. Things that could cause this problem include:  Fat and other substances building up in the lining of a tube.  Swelling of the walls of a blood vessel.  Certain tissue diseases.  Belly (abdominal) trauma.  An infection in the main artery of the body.  What increases the risk? There are things that make it more likely for you to have an aneurysm. These include:  Being over the age of 78 years old.  Having high blood pressure (hypertension).  Being a male.  Being white.  Being very overweight (obese).  Having a family history of aneurysm.  Using tobacco products.  What are the signs or symptoms? Symptoms depend on the size of the aneurysm and how fast it grows. There may not be symptoms. If symptoms occur, they can include:  Pain (belly, side, lower back, or groin).  Feeling full after eating a small amount of food.  Feeling sick to your stomach (nauseous), throwing up (vomiting), or both.  Feeling a lump in your belly that feels like it is beating (pulsating).  Feeling like you will pass out (faint).  How is this treated?  Medicine to control blood pressure and pain.  Imaging tests to  see if the aneurysm gets bigger.  Surgery. How is this prevented? To lessen your chance of getting this condition:  Stop smoking. Stop chewing tobacco.  Limit or avoid alcohol.  Keep your blood pressure, blood sugar, and cholesterol within normal limits.  Eat less salt.  Eat foods low in saturated fats and cholesterol. These are found in animal and whole dairy products.  Eat more fiber. Fiber is found in whole grains, vegetables, and fruits.  Keep a healthy weight.  Stay active and exercise often.  This information is not intended to replace advice given to you by your health care provider. Make sure you discuss any questions you have with your health care provider. Document Released: 01/04/2013 Document Revised: 02/15/2016 Document Reviewed: 10/09/2012 Elsevier Interactive Patient Education  2017 [employer].

## 2018-08-17 NOTE — Progress Notes (Signed)
[facility] [place]   CC: Follow up Abdominal Aortic Aneurysm  History of Present Illness  Tony [name] is a 77 y.o. ([birth year]) male whom Dr. Donnetta Hutching has monitored for a known small AAA, first seen in this office in 2012. Small AAA found incidentally in a work up for his prostate cancer. Previous studies demonstrate an AAA, measuring 3.4 cm on 07/28/12. The patient does not have back or abdominal pain. The patient is a former smoker.  The patient denies any history of stroke or TIA symptoms.  He reports numbness in the tip of his left index finger, has had this for some time. He denies pain or other symptoms in either upper extremity.   He can walk about a mile before his calves start to feel weak, but he admits not walking much, but he swims in an outside pool in the Summer daily.  He was diagnosed with lung cancer in 2019, is receiving radiation tx.    Diabetic: No Tobacco use: former smoker, quit 2000, quit when he had an MI, had a 4 vessel CABG    Past Medical History:  Diagnosis Date  . Anemia   . CAD (coronary artery disease)   . Constipation   . Gout   . Heart disease   . History of blood transfusion   . Hyperlipidemia   . Hypertension   . Mass of lower lobe of right lung   . Myocardial infarction ([place])    MI in 2000  . Peripheral vascular disease (HCC)    Abdominal Aortic Aneurysm  . Prostate cancer [facility]) Nov. 2012  . Ulcer    Past Surgical History:  Procedure Laterality Date  . ANAL FISSURECTOMY    . CORONARY ARTERY BYPASS GRAFT     2000  . EYE SURGERY Left Nov. 2013   Cataract  . EYE SURGERY Right Feb. 2014   Cataract  . PR VEIN BYPASS GRAFT,AORTO-FEM-POP  2000  . PROSTATE SURGERY  Nov. 2012  . ROBOT ASSISTED LAPAROSCOPIC RADICAL PROSTATECTOMY  08/08/2011   Procedure: ROBOTIC ASSISTED LAPAROSCOPIC RADICAL PROSTATECTOMY LEVEL 2;  Surgeon: Dutch Gray, MD;  Location: [facility] ORS;  Service: Urology;  Laterality: Bilateral;  with  Bilateral Pelvic Lymphadenectomy   . SHOULDER SURGERY  1980'S  . VIDEO BRONCHOSCOPY WITH ENDOBRONCHIAL ULTRASOUND N/A 08/06/2018   Procedure: VIDEO BRONCHOSCOPY WITH ENDOBRONCHIAL ULTRASOUND;  Surgeon: Melrose Nakayama, MD;  Location: [facility] OR;  Service: Thoracic;  Laterality: N/A;   Social History Social History   Socioeconomic History  . Marital status: Married    Spouse name: Not on file  . Number of children: Not on file  . Years of education: Not on file  . Highest education level: Not on file  Occupational History  . Not on file  Social Needs  . Financial resource strain: Not on file  . Food insecurity:    Worry: Not on file    Inability: Not on file  . Transportation needs:    Medical: Not on file    Non-medical: Not on file  Tobacco Use  . Smoking status: Former Smoker    Packs/day: 1.50    Years: 43.00    Pack years: 64.50    Types: Cigarettes    Last attempt to quit: 09/23/1998    Years since quitting: 19.9  . Smokeless tobacco: Never Used  Substance and Sexual Activity  . Alcohol use: Yes    Alcohol/week: 7.0 standard drinks    Types: 7 Glasses of wine  per week  . Drug use: No  . Sexual activity: Not on file  Lifestyle  . Physical activity:    Days per week: Not on file    Minutes per session: Not on file  . Stress: Not on file  Relationships  . Social connections:    Talks on phone: Not on file    Gets together: Not on file    Attends religious service: Not on file    Active member of club or organization: Not on file    Attends meetings of clubs or organizations: Not on file    Relationship status: Not on file  . Intimate partner violence:    Fear of current or ex partner: Not on file    Emotionally abused: Not on file    Physically abused: Not on file    Forced sexual activity: Not on file  Other Topics Concern  . Not on file  Social History Narrative  . Not on file   Family History Family History  Problem Relation Age of Onset  .  Diabetes Mother   . Heart disease Mother        CHF  . Heart disease Father   . Heart failure Father        Amputation- Leg  . Deep vein thrombosis Father   . Heart disease Brother        Heart Disease before age 16  . Heart failure Brother   . Hyperlipidemia Brother   . Hypertension Brother   . Heart disease Sister   . Diabetes Sister   . Heart failure Sister     Current Outpatient Medications on File Prior to Visit  Medication Sig Dispense Refill  . acetaminophen (TYLENOL) 650 MG CR tablet Take 650 mg by mouth daily as needed for pain.     Marland Kitchen aspirin 81 MG tablet Take 81 mg by mouth every evening.     . bisacodyl (DULCOLAX) 10 MG suppository Place 1 suppository (10 mg total) rectally as needed for moderate constipation. 12 suppository 0  . colchicine (COLCRYS) 0.6 MG tablet Take 0.6 mg by mouth as needed (gout flare).     . folic acid (FOLVITE) 174 MCG tablet Take 800 mcg by mouth daily.     . metoprolol tartrate (LOPRESSOR) 50 MG tablet Take 50 mg by mouth 2 (two) times daily.    . Multiple Vitamins-Minerals (MULTIVITAMIN WITH MINERALS) tablet Take 1 tablet by mouth daily.    . niacin 500 MG tablet Take 500 mg by mouth 2 (two) times daily.    . Omega-3 Fatty Acids (FISH OIL) 1200 MG CAPS Take 1,200 mg by mouth once.     Marland Kitchen omeprazole (PRILOSEC OTC) 20 MG tablet Take 10 mg by mouth 2 (two) times a week.    . polyethylene glycol (MIRALAX / GLYCOLAX) packet Take 17 g by mouth daily as needed for mild constipation.     . pravastatin (PRAVACHOL) 10 MG tablet Take 10 mg by mouth every evening.     . Psyllium (METAMUCIL FIBER PO) Take 1 Dose by mouth daily.    . ramipril (ALTACE) 10 MG capsule Take 10 mg by mouth every morning.     . triamterene-hydrochlorothiazide (DYAZIDE) 37.5-25 MG per capsule Take 0.5 capsules by mouth every morning.      No current facility-administered medications on file prior to visit.    No Known Allergies  ROS: See HPI for pertinent positives and  negatives.  Physical Examination  Vitals:  08/17/18 1020  BP: (!) 157/80  Pulse: (!) 51  Resp: 20  Temp: (!) 97.1 F (36.2 C)  TempSrc: Oral  SpO2: 98%  Weight: 174 lb 9.6 oz (79.2 kg)  Height: 5\' 9"  (1.753 m)   Body mass index is 25.78 kg/m.  General: A&O x 3, WD, male. HEENT: Grossly intact and WNL.  Pulmonary: Sym exp, respirations are non labored, fair air movement in all fields, no rales, rhonchi, or wheezes. Cardiac: Regular rhythm with occasional irregular rhythm, bradycardic (on a beta blocker), no detected murmur.  Carotid Bruits Right Left   Negative Negative   Adominal aortic pulse is not palpable Radial pulses are 2+ palpable                           VASCULAR EXAM:                                                                                                         LE Pulses Right Left       FEMORAL  2+ palpable  2+ palpable        POPLITEAL  not palpable   not palpable       POSTERIOR TIBIAL  not palpable   not palpable        DORSALIS PEDIS      ANTERIOR TIBIAL not palpable  not palpable     Gastrointestinal: soft, NTND, -G/R, - HSM, - masses palpated, - CVAT B. Musculoskeletal: M/S 5/5 in upper extremities, 3/5 in LE's, Extremities without ischemic changes. Skin: No rashes, no ulcers, no cellulitis.   Neurologic: CN 2-12 intact, pain and light touch intact in extremities are intact, Motor exam as listed above. Psychiatric: Normal thought content, mood appropriate to clinical situation.    DATA  AAA Duplex (08/17/2018):  Previous size: 3.9 cm (Date: 08-19-17)  Current size:  3.5 cm (Date: 08-17-18); Right CIA: 0.8 cm; Left CIA: 1.0 cm   ABI (Date: 08/17/2018):  R:   ABI: 0.69 (no previous for comparison),   PT: mono  DP: mono  TBI:  0.43, roe pressure 57  L:   ABI: 0.70 (no previous),   PT: mono  DP: mono  TBI: 0.41, toe pressure 55 Moderate disease bilaterally with all monophasic waveforms, no previous for  comparison.     Medical Decision Making  The patient is a 77 y.o. male who presents with asymptomatic AAA with no increase in size, at 3.5 cm today.  He denies claudication type symptoms in his legs with walking. ABI'S today demonstrate moderate bilateral peripheral artery occlusive disease.    Based on this patient's exam and diagnostic studies, the patient will follow up in 18 months  with the following studies:AAA duplex and ABI's.   Consideration for repair of AAA would be made when the size is 5.0 cm, growth > 1 cm/yr, and symptomatic status.  Pt states he feels tired, and not much like waking since he was diagnosed with lung cancer.  Daily seated leg exercises discussed and demonstrated.  The patient was given information about AAA including signs, symptoms, treatment, and how to minimize the risk of enlargement and rupture of aneurysms.    I emphasized the importance of maximal medical management including strict control of blood pressure, blood glucose, and lipid levels, antiplatelet agents, obtaining regular exercise, and continued cessation of smoking.   The patient was advised to call 911 should the patient experience sudden onset abdominal or back pain.   Thank you for allowing [place] to participate in this patient's care.  Clemon Chambers, RN, MSN, FNP-C [facility] [place] Office: [phone]  Clinic Physician: Trula Slade  08/17/2018, 10:40 AM

## 2018-08-18 ENCOUNTER — Ambulatory Visit
Admission: RE | Admit: 2018-08-18 | Discharge: 2018-08-18 | Disposition: A | Discharge status: Home / Self Care | Payer: Medicare Other | Source: Ambulatory Visit | Attending: Radiation Oncology | Admitting: Radiation Oncology

## 2018-08-18 ENCOUNTER — Other Ambulatory Visit: Payer: Self-pay | Admitting: Radiation Therapy

## 2018-08-18 DIAGNOSIS — C7951 Secondary malignant neoplasm of bone: Secondary | ICD-10-CM | POA: Diagnosis not present

## 2018-08-19 ENCOUNTER — Ambulatory Visit: Payer: Medicare Other

## 2018-08-19 NOTE — Progress Notes (Signed)
Has armband been applied?  Yes  Does patient have an allergy to IV contrast dye?: No   Has patient ever received premedication for IV contrast dye?: N/A  Does patient take metformin?: No  If patient does take metformin when was the last dose: N/A  Date of lab work: 08/04/18 BUN: 12 CR: 0.96 EGFR: >60  IV site: Right AC  Has IV site been added to flowsheet? Yes

## 2018-08-19 NOTE — Progress Notes (Signed)
Location/Histology of Brain Tumor:  08/01/18 MRI brain IMPRESSION: 1. Three total nodular foci of enhancement measuring up to 4 mm involving the central and right cerebellum as above, highly suspicious for small intracranial metastases. No significant edema or other complication at this time. 2. Osseous metastasis involving the left lateral mass of C1. 3. No other MRI evidence for intracranial metastatic disease. No other acute intracranial abnormality.  Patient presented with symptoms of:  No symptoms related to brain metastasis.   Past or anticipated interventions, if any, per neurosurgery:  N/A  Past or anticipated interventions, if any, per medical oncology:  08/14/18 Tony Dilling NP oncology This is a very pleasant 77 year old white male with stage IV non-small cell carcinoma, adenocarcinoma who presented with large right lower lobe lung mass in addition to metastatic disease to the mediastinal lymph nodes as well as adrenal glands and bone disease involving the right shoulder, lumbar spine and pelvic bones. The patient had a recent MRI of the brain and biopsy of his lung lesion and is here to discuss the results.  The patient was seen with Dr. Julien [name].  MRI of the brain results were discussed with the patient and his wife.  MRI showed several small areas likely consistent with metastatic disease.  The patient is already scheduled for an MRI of the brain with SRS protocol.  Recommend for the patient to undergo the MRI as scheduled and pending the results, he will be considered for SRS to the brain lesions.  He will follow-up with Dr. Sondra [name] for this.  Biopsy results were discussed with the patient which were consistent with non-small cell lung cancer, adenocarcinoma.  We discussed his diagnosis, prognosis, and treatment options.  We discussed with the patient that we did not have enough tissue to send for PDL 1 testing or molecular studies.  The patient will be sent back today for guardant  360 testing and pending the results, will have a more detailed discussion of treatment including targeted therapy if there are actionable mutations versus chemotherapy plus immunotherapy versus a referral for palliative care/hospice.  He will return in 2 weeks for more detailed discussion of his treatment options, we have the guardant 360 results.  The patient will undergo palliative radiation to the right shoulder as scheduled.  Dose of Decadron, if applicable: N/A  Recent neurologic symptoms, if any:   Seizures: No  Headaches: He has an occasional headache at night to the back of his head.   Nausea: No  Dizziness/ataxia: No  Difficulty with hand coordination: No  Focal numbness/weakness: No  Visual deficits/changes: No  Confusion/Memory deficits: No  Painful bone metastases at present, if any: Yes, C1 arch lytic metastasis and right glenoid lesion metastasis.   SAFETY ISSUES:  Prior radiation? Currently receiving radiation: 08/11/18 Dr. Sondra [name]- The patient will receive 30 Gy in 10 fractions directed at the left sided anterior C1 arch lytic metastasis and the right glenoid lesion metastasis. He started 08/17/18      Pacemaker/ICD? No  Possible current pregnancy? No  Is the patient on methotrexate?   Additional Complaints / other details:   BP 125/78 (BP Location: Right Arm, Patient Position: Sitting)   Pulse 60   Temp 98.1 F (36.7 C) (Oral)   Resp 20   Ht 5\' 9"  (1.753 m)   Wt 175 lb 12.8 oz (79.7 kg)   SpO2 100%   BMI 25.96 kg/m    Wt Readings from Last 3 Encounters:  08/25/18 175 lb 12.8 oz (79.7  kg)  08/17/18 174 lb 9.6 oz (79.2 kg)  08/13/18 174 lb 3.2 oz (79 kg)

## 2018-08-22 ENCOUNTER — Ambulatory Visit
Admission: RE | Admit: 2018-08-22 | Discharge: 2018-08-22 | Disposition: A | Discharge status: Home / Self Care | Payer: Medicare Other | Source: Ambulatory Visit | Attending: Radiation Oncology | Admitting: Radiation Oncology

## 2018-08-22 DIAGNOSIS — C7931 Secondary malignant neoplasm of brain: Secondary | ICD-10-CM

## 2018-08-22 DIAGNOSIS — C7949 Secondary malignant neoplasm of other parts of nervous system: Principal | ICD-10-CM

## 2018-08-22 MED ORDER — GADOBENATE DIMEGLUMINE 529 MG/ML IV SOLN
15.0000 mL | Freq: Once | INTRAVENOUS | Status: AC | PRN
  Administered 2018-08-22: 15 mL via INTRAVENOUS

## 2018-08-24 ENCOUNTER — Ambulatory Visit
Admission: RE | Admit: 2018-08-24 | Discharge: 2018-08-24 | Disposition: A | Discharge status: Home / Self Care | Payer: Medicare Other | Source: Ambulatory Visit | Attending: Radiation Oncology | Admitting: Radiation Oncology

## 2018-08-24 ENCOUNTER — Other Ambulatory Visit: Payer: Medicare Other

## 2018-08-24 DIAGNOSIS — C7951 Secondary malignant neoplasm of bone: Secondary | ICD-10-CM | POA: Diagnosis not present

## 2018-08-24 DIAGNOSIS — C3431 Malignant neoplasm of lower lobe, right bronchus or lung: Secondary | ICD-10-CM | POA: Insufficient documentation

## 2018-08-24 DIAGNOSIS — C7931 Secondary malignant neoplasm of brain: Secondary | ICD-10-CM | POA: Diagnosis not present

## 2018-08-25 ENCOUNTER — Ambulatory Visit
Admission: RE | Admit: 2018-08-25 | Discharge: 2018-08-25 | Disposition: A | Discharge status: Home / Self Care | Payer: Medicare Other | Source: Ambulatory Visit | Attending: Radiation Oncology | Admitting: Radiation Oncology

## 2018-08-25 VITALS — BP 125/78 | HR 60 | Temp 98.1°F | Resp 20 | Ht 69.0 in | Wt 175.8 lb

## 2018-08-25 DIAGNOSIS — C7951 Secondary malignant neoplasm of bone: Secondary | ICD-10-CM | POA: Diagnosis not present

## 2018-08-25 DIAGNOSIS — C7931 Secondary malignant neoplasm of brain: Secondary | ICD-10-CM

## 2018-08-25 MED ORDER — SODIUM CHLORIDE (PF) 0.9 % IJ SOLN
10.0000 mL | Freq: Once | INTRAMUSCULAR | Status: AC
  Administered 2018-08-25: 10 mL via INTRAVENOUS

## 2018-08-25 NOTE — Addendum Note (Signed)
Encounter addended by: Tayari Yankee, Stephani Police, RN on: 08/25/2018 10:38 AM  Actions taken: MAR administration accepted

## 2018-08-25 NOTE — Progress Notes (Signed)
Brain and Spine Tumor Board Documentation  Tony [name] was presented by Cecil Cobbs, MD at Brain and Spine Tumor Board on 08/25/2018, which included representatives from neuro oncology, radiation oncology, surgical oncology, pathology, radiology, navigation.  Tony [name] was presented as a current patient with history of the following treatments:  .  Additionally, we reviewed previous medical and familial history, history of present illness, and recent lab results along with all available histopathologic and imaging studies. The tumor board considered available treatment options and made the following recommendations:  Radiation therapy (primary modality) SRS to 5 metastases  Tumor board is a meeting of clinicians from various specialty areas who evaluate and discuss patients for whom a multidisciplinary approach is being considered. Final determinations in the plan of care are those of the provider(s). The responsibility for follow up of recommendations given during tumor board is that of the provider.   Today's extended care, comprehensive team conference, [place] was not present for the discussion and was not examined.

## 2018-08-25 NOTE — Progress Notes (Signed)
Radiation Oncology         [phone]) [phone] ________________________________  Outpatient Re-Consultation  Name: Tony [name] MRN: [id number]  Date: 08/25/2018  DOB: [birth year]  CC:[name], MD  [name], MD   REFERRING PHYSICIAN: [name], MD  DIAGNOSIS: The encounter diagnosis was Metastasis to brain [facility]).    ICD-10-CM   1. 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 Brain metastases [facility] C79.31      HISTORY OF PRESENT ILLNESS::Tony [name] is a 77 y.o. male who had been complaining of right shoulder pain since July 2019. He was referred to Dr. Onnie Graham and was treated for bursitis with steroids. He did not have any significant improvement of his symptoms, and a repeat MRI of the shoulder showed a suspicious lesion in the right shoulder area. The patient was referred back to his PCP, and chest x-ray on 07/13/18 showed a suspicious right lung mass. This was followed by a CT scan of the chest on 07/20/18 that showed a 3.6 x 3.1 cm soft tissue mass lesion in the medial aspect of the right lower lobe, consistent with primary pulmonary neoplasm, with associated subcarinal adenopathy. Additionally, there was an expansile lytic lesion within the spinous process and lamina of L1, as well as some endplate irregularity inferiorly at L1, consistent with metastatic disease. There were also new lesions within the adrenal glands bilaterally, suspicious for metastatic disease.  The patient had a PET scan on 07/27/18 that showed findings consistent with right lower lobe primary bronchogenic carcinoma, measuring 4.2 x 3.8 cm, with metastatic disease to the mediastinum, right and likely left adrenal glands, and bones (C1, right glenoid, L1, and left iliac wing).    MRI of the brain on 08/01/18 demonstrated three total nodular foci of enhancement measuring up to 4 mm involving the central and right cerebellum, highly suspicious for small intracranial metastases. There was also an osseous  metastasis involving the left lateral mass of C1.   He subsequently underwent bronchoscopy on 08/06/18. The cytology revealed malignant cells consistent with adenocarcinoma.  Repeat MRI of the brain with SRS protocol on 08/22/18 showed stable appearance of 5 posterior fossa metastases measuring up to 3 mm, as well as the left C1 osseous metastasis.   The patient's case was reviewed at CNS tumor board yesterday, and I have personally reviewed his images. He presents today for discussion stereotactic radiosurgery to the brain. He is accompanied by his wife.  On review of systems, the patient reports occasional headaches and tingling in the back of his head at night. He reports pain across his posterior neck. He reports a history of PVCs. He reports chronic numbness in his bilateral thumbs. He is currently receiving palliative RT with Dr Sondra Come.  PREVIOUS RADIATION THERAPY: Yes - Currently receiving radiation, per Dr. Sondra Come: The patient will receive 30Gy in 85fractions directed at the left sided anterior C1 arch lytic metastasis and the right glenoid lesion metastasis. He started treatment on 08/17/18.  PAST MEDICAL HISTORY:  has a past medical history of Anemia, CAD (coronary artery disease), Constipation, Gout, Heart disease, History of blood transfusion, Hyperlipidemia, Hypertension, Mass of lower lobe of right lung, Myocardial infarction [facility]), Peripheral vascular disease ([place]), Prostate cancer ([place]) (Nov. 2012), and Ulcer.    PAST SURGICAL HISTORY: Past Surgical History:  Procedure Laterality Date  . ANAL FISSURECTOMY    . CORONARY ARTERY BYPASS GRAFT     2000  . EYE SURGERY Left Nov. 2013   Cataract  . EYE SURGERY Right  Feb. 2014   Cataract  . PR VEIN BYPASS GRAFT,AORTO-FEM-POP  2000  . PROSTATE SURGERY  Nov. 2012  . ROBOT ASSISTED LAPAROSCOPIC RADICAL PROSTATECTOMY  08/08/2011   Procedure: ROBOTIC ASSISTED LAPAROSCOPIC RADICAL PROSTATECTOMY LEVEL 2;  Surgeon: Dutch Gray, MD;   Location: [facility];  Service: Urology;  Laterality: Bilateral;  with Bilateral Pelvic Lymphadenectomy   . SHOULDER SURGERY  1980'S  . VIDEO BRONCHOSCOPY WITH ENDOBRONCHIAL ULTRASOUND N/A 08/06/2018   Procedure: VIDEO BRONCHOSCOPY WITH ENDOBRONCHIAL ULTRASOUND;  Surgeon: Melrose Nakayama, MD;  Location: [place];  Service: Thoracic;  Laterality: N/A;    FAMILY HISTORY: family history includes Deep vein thrombosis in his father; Diabetes in his mother and sister; Heart disease in his brother, father, mother, and sister; Heart failure in his brother, father, and sister; Hyperlipidemia in his brother; Hypertension in his brother.  SOCIAL HISTORY:  reports that he quit smoking about 19 years ago. His smoking use included cigarettes. He has a 64.50 pack-year smoking history. He has never used smokeless tobacco. He reports that he drinks about 7.0 standard drinks of alcohol per week. He reports that he does not use drugs.  ALLERGIES: Patient has no known allergies.  MEDICATIONS:  Current Outpatient Medications  Medication Sig Dispense Refill  . acetaminophen (TYLENOL) 650 MG CR tablet Take 650 mg by mouth daily as needed for pain.     Marland Kitchen aspirin 81 MG tablet Take 81 mg by mouth every evening.     . bisacodyl (DULCOLAX) 10 MG suppository Place 1 suppository (10 mg total) rectally as needed for moderate constipation. 12 suppository 0  . folic acid (FOLVITE) 034 MCG tablet Take 800 mcg by mouth daily.     . metoprolol tartrate (LOPRESSOR) 50 MG tablet Take 50 mg by mouth 2 (two) times daily.    . Multiple Vitamins-Minerals (MULTIVITAMIN WITH MINERALS) tablet Take 1 tablet by mouth daily.    . niacin 500 MG tablet Take 500 mg by mouth 2 (two) times daily.    . Omega-3 Fatty Acids (FISH OIL) 1200 MG CAPS Take 1,200 mg by mouth once.     Marland Kitchen omeprazole (PRILOSEC OTC) 20 MG tablet Take 10 mg by mouth 2 (two) times a week.    . polyethylene glycol (MIRALAX / GLYCOLAX) packet Take 17 g by mouth daily as needed  for mild constipation.     . pravastatin (PRAVACHOL) 10 MG tablet Take 10 mg by mouth every evening.     . Psyllium (METAMUCIL FIBER PO) Take 1 Dose by mouth daily.    . ramipril (ALTACE) 10 MG capsule Take 10 mg by mouth every morning.     . triamterene-hydrochlorothiazide (DYAZIDE) 37.5-25 MG per capsule Take 0.5 capsules by mouth every morning.     . colchicine (COLCRYS) 0.6 MG tablet Take 0.6 mg by mouth as needed (gout flare).      No current facility-administered medications for this encounter.     REVIEW OF SYSTEMS:  A 10+ POINT REVIEW OF SYSTEMS WAS OBTAINED including neurology, dermatology, psychiatry, cardiac, respiratory, lymph, extremities, GI, GU, Musculoskeletal, constitutional, breasts, reproductive, HEENT.  All pertinent positives are noted in the HPI.  All others are negative.   PHYSICAL EXAM:  height is 5\' 9"  (1.753 m) and weight is 175 lb 12.8 oz (79.7 kg). His oral temperature is 98.1 F (36.7 C). His blood pressure is 125/78 and his pulse is 60. His respiration is 20 and oxygen saturation is 100%.   General: Alert and oriented, in no acute  distress. HEENT: Head is normocephalic. Extraocular movements are intact. Oropharynx is clear. Neck: Neck is supple, no palpable cervical or supraclavicular lymphadenopathy. Heart: Distant heart sounds and possibly an irregular rhythm but difficult to ascertain - could be PVCs, regular in rate. No murmurs, rubs, or gallops. Chest: Clear to auscultation bilaterally, with no rhonchi, wheezes, or rales. Abdomen: Soft, nontender, nondistended, with no rigidity or guarding. Extremities: No cyanosis or edema. Lymphatics: see Neck Exam Skin: No concerning lesions. Musculoskeletal: Symmetric strength and muscle tone throughout. Neurologic: Cranial nerves II through XII are grossly intact. No obvious focalities. Speech is fluent. Coordination is intact. Finger-to-nose testing is intact. Psychiatric: Judgment and insight are intact. Affect is  appropriate.   LABORATORY DATA:  Lab Results  Component Value Date   WBC 6.4 08/04/2018   HGB 13.8 08/04/2018   HCT 43.2 08/04/2018   MCV 99.5 08/04/2018   PLT 202 08/04/2018   CMP     Component Value Date/Time   NA 136 08/04/2018 1441   NA 139 08/10/2012 1328   K 3.8 08/04/2018 1441   K 4.1 08/10/2012 1328   CL 102 08/04/2018 1441   CL 105 08/10/2012 1328   CO2 26 08/04/2018 1441   CO2 29 08/10/2012 1328   GLUCOSE 139 (H) 08/04/2018 1441   GLUCOSE 112 (H) 08/10/2012 1328   BUN 12 08/04/2018 1441   BUN 18.0 08/10/2012 1328   CREATININE 0.96 08/04/2018 1441   CREATININE 1.02 07/30/2018 1338   CREATININE 1.1 08/10/2012 1328   CALCIUM 9.4 08/04/2018 1441   CALCIUM 9.0 08/10/2012 1328   PROT 6.7 08/04/2018 1441   PROT 6.1 (L) 08/10/2012 1328   ALBUMIN 3.5 08/04/2018 1441   ALBUMIN 3.2 (L) 08/10/2012 1328   AST 21 08/04/2018 1441   AST 15 07/30/2018 1338   AST 42 (H) 08/10/2012 1328   ALT 14 08/04/2018 1441   ALT 9 07/30/2018 1338   ALT 55 08/10/2012 1328   ALKPHOS 94 08/04/2018 1441   ALKPHOS 144 08/10/2012 1328   BILITOT 0.3 08/04/2018 1441   BILITOT 0.6 07/30/2018 1338   BILITOT 0.91 08/10/2012 1328   GFRNONAA >60 08/04/2018 1441   GFRNONAA >60 07/30/2018 1338   GFRAA >60 08/04/2018 1441   GFRAA >60 07/30/2018 1338         RADIOGRAPHY: Personally reviewed by me. Dg Chest 2 View  Result Date: 08/05/2018 CLINICAL DATA:  Preoperative examination prior to lung biopsy of a right lower lobe mass. History of coronary artery disease and CABG. Previous MI. EXAM: CHEST - 2 VIEW COMPARISON:  PA and lateral chest x-ray of July 16, 2018 FINDINGS: The lungs are well-expanded. There is no focal infiltrate. There is no pleural effusion. There is density present in the posteromedial aspect of the right lower lobe. The heart and pulmonary vascularity are normal. There are post CABG changes. There is calcification in the wall of the aortic arch. The bony thorax exhibits no  acute abnormality. IMPRESSION: Persistent right lower lobe airspace opacity consistent with a known mass. Previous CABG. Otherwise no acute cardiopulmonary abnormality. Thoracic aortic atherosclerosis. Electronically Signed   By: David  Martinique M.D.   On: 08/05/2018 07:03   Mr Jeri Cos ZO Contrast  Result Date: 08/22/2018 CLINICAL DATA:  Treatment planning of metastatic lung cancer. EXAM: MRI HEAD WITHOUT AND WITH CONTRAST TECHNIQUE: Multiplanar, multiecho pulse sequences of the brain and surrounding structures were obtained without and with intravenous contrast. CONTRAST:  75mL MULTIHANCE GADOBENATE DIMEGLUMINE 529 MG/ML IV SOLN COMPARISON:  MRI of  the head August 01, 2018 FINDINGS: BRAIN New Lesions: None. Larger lesions: None. Stable or Smaller lesions: 5 cerebellar enhancing metastasis noted measuring less than 3 mm ( 3 RIGHT, 1 LEFT and 1 for and nodules annotated on series 10 image 25, 30, 35, 36 and 45). OTHER INTRACRANIAL CONTENTS: No reduced diffusion to suggest acute ischemia. No susceptibility artifact to suggest hemorrhage. The ventricles and sulci are normal for patient's age. A few scattered subcentimeter supratentorial white matter FLAIR T2 hyperintensities compatible with mild chronic small vessel ischemic changes, less than expected for age. No suspicious parenchymal signal, masses, mass effect. No abnormal extra-axial fluid collections. No extra-axial masses. VASCULAR: Normal major intracranial vascular flow voids present at skull base. SKULL AND UPPER CERVICAL SPINE: No abnormal sellar expansion. Abnormal reduced diffusion, enhancement and low T2 signal LEFT C1 lateral mass. Craniocervical junction maintained. SINUSES/ORBITS: The mastoid air-cells and included paranasal sinuses are well-aerated.The included ocular globes and orbital contents are non-suspicious. Status post bilateral ocular lens implants. OTHER: None. IMPRESSION: 1. Stable appearance of 5 posterior fossa metastasis measuring to  3 mm. LEFT C1 osseous metastasis. 2. No acute intracranial process. Electronically Signed   By: Elon Alas M.D.   On: 08/22/2018 20:59   Mr Jeri Cos GY Contrast  Result Date: 08/02/2018 CLINICAL DATA:  Initial evaluation for non-small cell lung cancer, staging. EXAM: MRI HEAD WITHOUT AND WITH CONTRAST TECHNIQUE: Multiplanar, multiecho pulse sequences of the brain and surrounding structures were obtained without and with intravenous contrast. CONTRAST:  80 cc of Gadavist. COMPARISON:  None available. FINDINGS: Brain: Generalized age-related cerebral atrophy. Minimal chronic microvascular ischemic changes present within the periventricular and deep white matter both cerebral hemispheres. Probable tiny remote lacunar infarct noted within the right thalamus. No evidence for acute infarct. No encephalomalacia to suggest chronic cortical infarction. No foci of susceptibility artifact to suggest acute or chronic intracranial hemorrhage. Subtle 4 mm focus of nodular enhancement seen within the right cerebellar hemisphere, suspicious for possible small metastasis (series 11, image 11). Additional tiny 3 mm nodular focus of enhancement seen slightly more inferiorly within the right cerebellum (series 11, image 10). Punctate focus of enhancement at the central cerebellar vermis may reflect a third small lesion (series 11, image 15). No other definite intracranial metastases or other foci of abnormal enhancement identified. No other mass lesion. No midline shift or mass effect. No hydrocephalus. No extra-axial fluid collection. Pituitary gland within normal limits. Midline structures intact and normal. Vascular: Major intracranial vascular flow voids maintained. Skull and upper cervical spine: Abnormal marrow replacing lesion seen involving the left lateral mass of C1 (series 11, image 2), most consistent with an osseous metastasis. No other focal marrow replacing lesions identified. Scalp soft tissues unremarkable.  Sinuses/Orbits: Patient status post ocular lens replacement bilaterally. Paranasal sinuses are clear. No mastoid effusion. Inner ear structures grossly within normal limits. Other: None. IMPRESSION: 1. Three total nodular foci of enhancement measuring up to 4 mm involving the central and right cerebellum as above, highly suspicious for small intracranial metastases. No significant edema or other complication at this time. 2. Osseous metastasis involving the left lateral mass of C1. 3. No other MRI evidence for intracranial metastatic disease. No other acute intracranial abnormality. Electronically Signed   By: Jeannine Boga M.D.   On: 08/02/2018 04:16   Nm Pet Image Initial (pi) Skull Base To Thigh  Result Date: 07/27/2018 CLINICAL DATA:  Initial treatment strategy for right lower lobe lung mass. EXAM: NUCLEAR MEDICINE PET SKULL BASE TO THIGH  TECHNIQUE: 8.9 mCi F-18 FDG was injected intravenously. Full-ring PET imaging was performed from the skull base to thigh after the radiotracer. CT data was obtained and used for attenuation correction and anatomic localization. Fasting blood glucose: 95 mg/dl COMPARISON:  Chest CT 07/20/2018 FINDINGS: Mediastinal blood pool activity: SUV max 2.6 NECK: No areas of abnormal hypermetabolism. Incidental CT findings: Cerebral and cerebellar atrophy. No cervical adenopathy. Bilateral carotid atherosclerosis. CHEST: Central right lower lobe hypermetabolic lung mass, which measures 4.2 x 3.8 cm and a S.U.V. max of 16.1 on image 40/8. Adjacent adenopathy within the azygoesophageal recess measures 2.2 x 1.5 cm and a S.U.V. max of 12.3 on image 68/4. Incidental CT findings: Median sternotomy for CABG. Other chest findings deferred to recent diagnostic CT. ABDOMEN/PELVIS: Right adrenal nodularity and hypermetabolism. 2.2 cm and a S.U.V. max of 11.6. There is more mild left adrenal hypermetabolism and minimal nodularity, including on a S.U.V. max of 3.6. Anal hypermetabolism is  without well-defined CT correlate. This measures a S.U.V. max of 13.1 including on image 186/4. Incidental CT findings: Infrarenal abdominal aortic ectasia at 3.2 cm. Bilateral renal vascular calcifications. Low-density bilateral renal lesions are likely cysts. Extensive colonic diverticulosis. Prostatectomy. SKELETON: Multifocal osseous metastasis. A left-sided anterior C1 arch lytic lesion measures 2.9 cm and a S.U.V. max of 14.8 on image 18/4. Hypermetabolic right glenoid lesion of a S.U.V. max of 15.3. L1 lesion was detailed on prior diagnostic CT. There is also a small lesion in the left iliac wing including on image 152/4. Incidental CT findings: none IMPRESSION: 1. 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 Metastatic disease to the mediastinum, right (and likely left) adrenal glands, and bones as detailed above. 2. Anal hypermetabolism warrants physical exam correlation to exclude synchronous polyp or mass. Electronically Signed   By: Abigail Miyamoto M.D.   On: 07/27/2018 10:31   Dg Chest Port 1 View  Result Date: 08/06/2018 CLINICAL DATA:  Status post bronchoscopy EXAM: PORTABLE CHEST 1 VIEW COMPARISON:  08/04/2018 FINDINGS: Cardiac shadow is stable. Postsurgical changes are again seen. Fullness in the right hilum is again identified and stable consistent with the known right-sided lung mass. The lungs are well aerated. No pneumothorax is noted following bronchoscopy. No bony abnormality is seen. IMPRESSION: No post bronchoscopy pneumothorax is noted. Electronically Signed   By: Inez Catalina M.D.   On: 08/06/2018 19:50   Vas [place] Burnard Bunting With/wo Tbi  Result Date: 08/17/2018 LOWER EXTREMITY DOPPLER STUDY Indications: Claudication. High Risk Factors: Hypertension, hyperlipidemia, coronary artery disease.  Performing Technologist: Ronal Fear RVS, RCS  Examination Guidelines: A complete evaluation includes at minimum, Doppler waveform signals and systolic blood pressure  reading at the level of bilateral brachial, anterior tibial, and posterior tibial arteries, when vessel segments are accessible. Bilateral testing is considered an integral part of a complete examination. Photoelectric Plethysmograph (PPG) waveforms and toe systolic pressure readings are included as required and additional duplex testing as needed. Limited examinations for reoccurring indications may be performed as noted.  ABI Findings: +---------+------------------+-----+----------+--------+ Right    Rt Pressure (mmHg)IndexWaveform  Comment  +---------+------------------+-----+----------+--------+ Brachial 133                                       +---------+------------------+-----+----------+--------+ PTA      92                0.69 monophasic         +---------+------------------+-----+----------+--------+  DP       91                0.68 monophasic         +---------+------------------+-----+----------+--------+ Great Toe57                0.43                    +---------+------------------+-----+----------+--------+ +---------+------------------+-----+----------+-------+ Left     Lt Pressure (mmHg)IndexWaveform  Comment +---------+------------------+-----+----------+-------+ Brachial 133                                      +---------+------------------+-----+----------+-------+ PTA      83                0.62 monophasic        +---------+------------------+-----+----------+-------+ DP       93                0.70 monophasic        +---------+------------------+-----+----------+-------+ Great Toe55                0.41                   +---------+------------------+-----+----------+-------+ +-------+-----------+-----------+------------+------------+ ABI/TBIToday's ABIToday's TBIPrevious ABIPrevious TBI +-------+-----------+-----------+------------+------------+ Right  0.69       0.43                                 +-------+-----------+-----------+------------+------------+ Left   0.70       0.41                                +-------+-----------+-----------+------------+------------+  Summary: Right: Resting right ankle-brachial index indicates moderate right lower extremity arterial disease. The right toe-brachial index is abnormal. Left: Resting left ankle-brachial index indicates moderate left lower extremity arterial disease. The left toe-brachial index is abnormal.  *See table(s) above for measurements and observations.  Electronically signed by Harold Barban MD on 08/17/2018 at 10:31:38 AM.   Final    Vas [place] Aaa Duplex  Result Date: 08/17/2018 ABDOMINAL AORTA STUDY Indications: Follow up exam for known AAA.  Performing Technologist: Ronal Fear RVS, RCS  Examination Guidelines: A complete evaluation includes B-mode imaging, spectral Doppler, color Doppler, and power Doppler as needed of all accessible portions of each vessel. Bilateral testing is considered an integral part of a complete examination. Limited examinations for reoccurring indications may be performed as noted.  Abdominal Aorta Findings: +-----------+-------+----------+----------+--------+--------+--------+ Location   AP (cm)Trans (cm)PSV (cm/s)WaveformThrombusComments +-----------+-------+----------+----------+--------+--------+--------+ Proximal   1.89   1.93      50                                 +-----------+-------+----------+----------+--------+--------+--------+ Mid        3.31   3.49      27                Present fusiform +-----------+-------+----------+----------+--------+--------+--------+ Distal     1.74   2.04      44                                 +-----------+-------+----------+----------+--------+--------+--------+ RT CIA Prox0.8  0.8       194                                +-----------+-------+----------+----------+--------+--------+--------+ LT CIA Prox1.0    1.0       142                                 +-----------+-------+----------+----------+--------+--------+--------+  Summary: Abdominal Aorta: There is evidence of abnormal dilitation of the Mid Abdominal aorta. The largest aortic measurement is 3.5 cm. The largest aortic diameter has decreased compared to prior exam. Previous diameter measurement was 3.9 cm obtained on 08/19/2017.  *See table(s) above for measurements and observations.  Electronically signed by Harold Barban MD on 08/17/2018 at 10:33:09 AM.   Final       IMPRESSION/PLAN: This is a very pleasant 77 y.o. man with metastatic disease to the brain.  I had a lengthy discussion with the patient and his wife after reviewing his MRI results with them.  We spoke about whole brain radiotherapy versus stereotactic radiosurgery to the brain. We spoke about the differing risks benefits and side effects of both of these treatments. During part of our discussion, we spoke about the hair loss, fatigue, and cognitive effects that can result from whole brain radiotherapy.  Additionally, we spoke about radionecrosis that can result from stereotactic radiosurgery. I explained that whole brain radiotherapy is more comprehensive and therefore can decrease the chance of recurrences elsewhere in the brain, while stereotactic radiosurgery only treats the areas of gross disease while sparing the rest of the brain parenchyma.  After lengthy discussion, the patient would like to proceed with stereotactic brain radiosurgery to his metastatic disease. He will meet with neurosurgery in the near future to discuss this further; a neurosurgeon will participate in his case.  Today, I talked to the patient about the findings and work-up thus far. We discussed the patient's diagnosis of brain metastases and general treatment for this, highlighting the role of radiotherapy in the management. We discussed the available radiation techniques, and focused on the details of logistics and  delivery.    We discussed the risks, benefits, and side effects of radiotherapy. Side effects may include but not necessarily be limited to: fatigue, headache, nausea, neurologic decline, brain necrosis. Most patients do well with this procedure without neurologic complications.  No guarantees of treatment were given. A consent form was signed and placed in the patient's medical record. The patient was encouraged to ask questions that I answered to the best of my ability.   CT simulation will take place today and treatment on 09/01/18.  I plan to deliver 20 Gy in 1 fraction to the 5 cerebellar metastases.  I will make a Lorazepam Rx to [facility] on [address] to help with tolerance of the mask during RT planning and delivery, and to help with future MRI scanning. __________________________________________   Eppie Gibson, MD  This document serves as a record of services personally performed by Eppie Gibson, MD. It was created on her behalf by Rae Lips, a trained medical scribe. The creation of this record is based on the scribe's personal observations and the provider's statements to them. This document has been checked and approved by the attending provider.

## 2018-08-26 ENCOUNTER — Ambulatory Visit
Admission: RE | Admit: 2018-08-26 | Discharge: 2018-08-26 | Disposition: A | Discharge status: Home / Self Care | Payer: Medicare Other | Source: Ambulatory Visit | Attending: Radiation Oncology | Admitting: Radiation Oncology

## 2018-08-26 ENCOUNTER — Encounter: Payer: Self-pay | Admitting: Radiation Oncology

## 2018-08-26 ENCOUNTER — Other Ambulatory Visit: Payer: Self-pay | Admitting: Oncology

## 2018-08-26 ENCOUNTER — Other Ambulatory Visit: Payer: Self-pay | Admitting: Radiation Oncology

## 2018-08-26 DIAGNOSIS — C7931 Secondary malignant neoplasm of brain: Secondary | ICD-10-CM

## 2018-08-26 DIAGNOSIS — C7951 Secondary malignant neoplasm of bone: Secondary | ICD-10-CM | POA: Diagnosis not present

## 2018-08-26 DIAGNOSIS — C3491 Malignant neoplasm of unspecified part of right bronchus or lung: Secondary | ICD-10-CM

## 2018-08-26 MED ORDER — LORAZEPAM 1 MG PO TABS: ORAL_TABLET | ORAL | 0 refills | Status: DC

## 2018-08-26 NOTE — Progress Notes (Signed)
  Name: Tony [name] MRN: [id number]  Date: 08/25/2018  DOB: 1940-09-25  SIMULATION AND TREATMENT PLANNING NOTE    ICD-10-CM   1. Brain metastases [facility]) C79.31       Radiation Oncology         [phone] ________________________________  Name: Tony [name] MRN: [id number]  Date: 08/25/2018  DOB: March 13, 1941  SIMULATION AND TREATMENT PLANNING NOTE Outpatient  DIAGNOSIS:  Brain metastases   ICD-10-CM   1. Brain metastases ([place]) C79.31     NARRATIVE:  The patient was brought to the [facility].  Identity was confirmed.  All relevant records and images related to the planned course of therapy were reviewed.  The patient freely provided informed written consent to proceed with treatment after reviewing the details related to the planned course of therapy. The consent form was witnessed and verified by the simulation staff. Intravenous access was established for contrast administration. Then, the patient was set-up in a stable reproducible supine position for radiation therapy.  A relocatable thermoplastic stereotactic head frame was fabricated for precise immobilization.  CT images were obtained.  Surface markings were placed.  The CT images were loaded into the planning software and fused with the patient's targeting MRI scan.  Then the target and avoidance structures were contoured.  Treatment planning then occurred.  The radiation prescription was entered and confirmed.  I have requested 3D planning  I have requested a DVH of the following structures: Brain stem, brain, left eye, right eye, lenses, optic chiasm, target volumes, uninvolved brain, and normal tissue.    SPECIAL TREATMENT PROCEDURE:  The planned course of therapy using radiation constitutes a special treatment procedure. Special care is required in the management of this patient for the following reasons:  High dose per fraction requiring special monitoring for increased toxicities of treatment including daily  imaging.  The special nature of the planned course of radiotherapy will require increased physician supervision and oversight to ensure patient's safety with optimal treatment outcomes.  PLAN:  The patient will receive 20 Gy in 1 fraction to his 5 cerebellar metastases with radiosurgery.  ________________________________    Eppie Gibson, MD

## 2018-08-27 ENCOUNTER — Inpatient Hospital Stay: Payer: Medicare Other | Admitting: Oncology

## 2018-08-27 ENCOUNTER — Ambulatory Visit
Admission: RE | Admit: 2018-08-27 | Discharge: 2018-08-27 | Disposition: A | Discharge status: Home / Self Care | Payer: Medicare Other | Source: Ambulatory Visit | Attending: Radiation Oncology | Admitting: Radiation Oncology

## 2018-08-27 ENCOUNTER — Other Ambulatory Visit: Payer: Self-pay | Admitting: Internal Medicine

## 2018-08-27 ENCOUNTER — Telehealth: Payer: Self-pay | Admitting: Oncology

## 2018-08-27 ENCOUNTER — Encounter: Payer: Self-pay | Admitting: Oncology

## 2018-08-27 ENCOUNTER — Inpatient Hospital Stay: Payer: Medicare Other | Attending: Oncology

## 2018-08-27 VITALS — BP 137/70 | HR 55 | Temp 98.5°F | Resp 18 | Ht 69.0 in | Wt 174.7 lb

## 2018-08-27 DIAGNOSIS — C3491 Malignant neoplasm of unspecified part of right bronchus or lung: Secondary | ICD-10-CM

## 2018-08-27 DIAGNOSIS — C771 Secondary and unspecified malignant neoplasm of intrathoracic lymph nodes: Secondary | ICD-10-CM | POA: Diagnosis not present

## 2018-08-27 DIAGNOSIS — C7951 Secondary malignant neoplasm of bone: Secondary | ICD-10-CM

## 2018-08-27 DIAGNOSIS — I10 Essential (primary) hypertension: Secondary | ICD-10-CM

## 2018-08-27 DIAGNOSIS — C3431 Malignant neoplasm of lower lobe, right bronchus or lung: Secondary | ICD-10-CM | POA: Diagnosis not present

## 2018-08-27 DIAGNOSIS — Z5112 Encounter for antineoplastic immunotherapy: Secondary | ICD-10-CM

## 2018-08-27 DIAGNOSIS — I2510 Atherosclerotic heart disease of native coronary artery without angina pectoris: Secondary | ICD-10-CM

## 2018-08-27 DIAGNOSIS — Z5111 Encounter for antineoplastic chemotherapy: Secondary | ICD-10-CM | POA: Insufficient documentation

## 2018-08-27 DIAGNOSIS — Z7189 Other specified counseling: Secondary | ICD-10-CM | POA: Insufficient documentation

## 2018-08-27 DIAGNOSIS — C7971 Secondary malignant neoplasm of right adrenal gland: Secondary | ICD-10-CM | POA: Insufficient documentation

## 2018-08-27 DIAGNOSIS — C7972 Secondary malignant neoplasm of left adrenal gland: Secondary | ICD-10-CM | POA: Insufficient documentation

## 2018-08-27 LAB — CMP (CANCER CENTER ONLY)
ALBUMIN: 3.6 g/dL (ref 3.5–5.0)
ALK PHOS: 107 U/L (ref 38–126)
ALT: 14 U/L (ref 0–44)
ANION GAP: 11 (ref 5–15)
AST: 20 U/L (ref 15–41)
BUN: 14 mg/dL (ref 8–23)
CHLORIDE: 102 mmol/L (ref 98–111)
CO2: 25 mmol/L (ref 22–32)
CREATININE: 0.99 mg/dL (ref 0.61–1.24)
Calcium: 9.9 mg/dL (ref 8.9–10.3)
GFR, Est AFR Am: >60 mL/min (ref >60)
GFR, Estimated: >60 mL/min (ref >60)
GLUCOSE: 109 mg/dL — AB (ref 70–99)
Potassium: 4.3 mmol/L (ref 3.5–5.1)
Sodium: 138 mmol/L (ref 135–145)
Total Bilirubin: 0.5 mg/dL (ref 0.3–1.2)
Total Protein: 7.5 g/dL (ref 6.5–8.1)

## 2018-08-27 LAB — CBC WITH DIFFERENTIAL (CANCER CENTER ONLY)
ABS IMMATURE GRANULOCYTES: 0.02 10*3/uL (ref 0.00–0.07)
BASOS ABS: 0 10*3/uL (ref 0.0–0.1)
BASOS PCT: 1 %
Eosinophils Absolute: 0.1 10*3/uL (ref 0.0–0.5)
Eosinophils Relative: 1 %
HCT: 42.2 % (ref 39.0–52.0)
HEMOGLOBIN: 14.1 g/dL (ref 13.0–17.0)
Immature Granulocytes: 0 %
LYMPHS PCT: 21 %
Lymphs Abs: 1.3 10*3/uL (ref 0.7–4.0)
MCH: 33.1 pg (ref 26.0–34.0)
MCHC: 33.4 g/dL (ref 30.0–36.0)
MCV: 99.1 fL (ref 80.0–100.0)
Monocytes Absolute: 0.6 10*3/uL (ref 0.1–1.0)
Monocytes Relative: 9 %
NEUTROS ABS: 4.1 10*3/uL (ref 1.7–7.7)
NEUTROS PCT: 68 %
NRBC: 0 % (ref 0.0–0.2)
PLATELETS: 156 10*3/uL (ref 150–400)
RBC: 4.26 MIL/uL (ref 4.22–5.81)
RDW: 13.5 % (ref 11.5–15.5)
WBC Count: 6.1 10*3/uL (ref 4.0–10.5)

## 2018-08-27 MED ORDER — LIDOCAINE-PRILOCAINE 2.5-2.5 % EX CREA: 1.0000 "application " | TOPICAL_CREAM | CUTANEOUS | 1 refills | Status: DC | PRN

## 2018-08-27 MED ORDER — CYANOCOBALAMIN 1000 MCG/ML IJ SOLN
INTRAMUSCULAR | Status: AC
  Filled 2018-08-27: qty 1

## 2018-08-27 MED ORDER — CYANOCOBALAMIN 1000 MCG/ML IJ SOLN
1000.0000 ug | Freq: Once | INTRAMUSCULAR | Status: AC
  Administered 2018-08-27: 1000 ug via INTRAMUSCULAR

## 2018-08-27 MED ORDER — PROCHLORPERAZINE MALEATE 10 MG PO TABS: 10.0000 mg | ORAL_TABLET | Freq: Four times a day (QID) | ORAL | 1 refills | Status: DC | PRN

## 2018-08-27 NOTE — Patient Instructions (Signed)
Carboplatin injection What is this medicine? CARBOPLATIN (KAR boe pla tin) is a chemotherapy drug. It targets fast dividing cells, like cancer cells, and causes these cells to die. This medicine is used to treat ovarian cancer and many other cancers. This medicine may be used for other purposes; ask your health care provider or pharmacist if you have questions. COMMON BRAND NAME(S): Paraplatin What should I tell my health care provider before I take this medicine? They need to know if you have any of these conditions: -blood disorders -hearing problems -kidney disease -recent or ongoing radiation therapy -an unusual or allergic reaction to carboplatin, cisplatin, other chemotherapy, other medicines, foods, dyes, or preservatives -pregnant or trying to get pregnant -breast-feeding How should I use this medicine? This drug is usually given as an infusion into a vein. It is administered in a hospital or clinic by a specially trained health care professional. Talk to your pediatrician regarding the use of this medicine in children. Special care may be needed. Overdosage: If you think you have taken too much of this medicine contact a poison control center or emergency room at once. NOTE: This medicine is only for you. Do not share this medicine with others. What if I miss a dose? It is important not to miss a dose. Call your doctor or health care professional if you are unable to keep an appointment. What may interact with this medicine? -medicines for seizures -medicines to increase blood counts like filgrastim, pegfilgrastim, sargramostim -some antibiotics like amikacin, gentamicin, neomycin, streptomycin, tobramycin -vaccines Talk to your doctor or health care professional before taking any of these medicines: -acetaminophen -aspirin -ibuprofen -ketoprofen -naproxen This list may not describe all possible interactions. Give your health care provider a list of all the medicines, herbs,  non-prescription drugs, or dietary supplements you use. Also tell them if you smoke, drink alcohol, or use illegal drugs. Some items may interact with your medicine. What should I watch for while using this medicine? Your condition will be monitored carefully while you are receiving this medicine. You will need important blood work done while you are taking this medicine. This drug may make you feel generally unwell. This is not uncommon, as chemotherapy can affect healthy cells as well as cancer cells. Report any side effects. Continue your course of treatment even though you feel ill unless your doctor tells you to stop. In some cases, you may be given additional medicines to help with side effects. Follow all directions for their use. Call your doctor or health care professional for advice if you get a fever, chills or sore throat, or other symptoms of a cold or flu. Do not treat yourself. This drug decreases your body's ability to fight infections. Try to avoid being around people who are sick. This medicine may increase your risk to bruise or bleed. Call your doctor or health care professional if you notice any unusual bleeding. Be careful brushing and flossing your teeth or using a toothpick because you may get an infection or bleed more easily. If you have any dental work done, tell your dentist you are receiving this medicine. Avoid taking products that contain aspirin, acetaminophen, ibuprofen, naproxen, or ketoprofen unless instructed by your doctor. These medicines may hide a fever. Do not become pregnant while taking this medicine. Women should inform their doctor if they wish to become pregnant or think they might be pregnant. There is a potential for serious side effects to an unborn child. Talk to your health care professional or  pharmacist for more information. Do not breast-feed an infant while taking this medicine. What side effects may I notice from receiving this medicine? Side effects  that you should report to your doctor or health care professional as soon as possible: -allergic reactions like skin rash, itching or hives, swelling of the face, lips, or tongue -signs of infection - fever or chills, cough, sore throat, pain or difficulty passing urine -signs of decreased platelets or bleeding - bruising, pinpoint red spots on the skin, black, tarry stools, nosebleeds -signs of decreased red blood cells - unusually weak or tired, fainting spells, lightheadedness -breathing problems -changes in hearing -changes in vision -chest pain -high blood pressure -low blood counts - This drug may decrease the number of white blood cells, red blood cells and platelets. You may be at increased risk for infections and bleeding. -nausea and vomiting -pain, swelling, redness or irritation at the injection site -pain, tingling, numbness in the hands or feet -problems with balance, talking, walking -trouble passing urine or change in the amount of urine Side effects that usually do not require medical attention (report to your doctor or health care professional if they continue or are bothersome): -hair loss -loss of appetite -metallic taste in the mouth or changes in taste This list may not describe all possible side effects. Call your doctor for medical advice about side effects. You may report side effects to FDA at 1-800-FDA-1088. Where should I keep my medicine? This drug is given in a hospital or clinic and will not be stored at home. NOTE: This sheet is a summary. It may not cover all possible information. If you have questions about this medicine, talk to your doctor, pharmacist, or health care provider.  2018 Elsevier/Gold Standard (2007-12-15 14:38:05)  Pemetrexed injection What is this medicine? PEMETREXED (PEM e TREX ed) is a chemotherapy drug used to treat lung cancers like non-small cell lung cancer and mesothelioma. It may also be used to treat other cancers. This  medicine may be used for other purposes; ask your health care provider or pharmacist if you have questions. COMMON BRAND NAME(S): Alimta What should I tell my health care provider before I take this medicine? They need to know if you have any of these conditions: -infection (especially a virus infection such as chickenpox, cold sores, or herpes) -kidney disease -low blood counts, like low white cell, platelet, or red cell counts -lung or breathing disease, like asthma -radiation therapy -an unusual or allergic reaction to pemetrexed, other medicines, foods, dyes, or preservative -pregnant or trying to get pregnant -breast-feeding How should I use this medicine? This drug is given as an infusion into a vein. It is administered in a hospital or clinic by a specially trained health care professional. Talk to your pediatrician regarding the use of this medicine in children. Special care may be needed. Overdosage: If you think you have taken too much of this medicine contact a poison control center or emergency room at once. NOTE: This medicine is only for you. Do not share this medicine with others. What if I miss a dose? It is important not to miss your dose. Call your doctor or health care professional if you are unable to keep an appointment. What may interact with this medicine? This medicine may interact with the following medications: -Ibuprofen This list may not describe all possible interactions. Give your health care provider a list of all the medicines, herbs, non-prescription drugs, or dietary supplements you use. Also tell them if you  smoke, drink alcohol, or use illegal drugs. Some items may interact with your medicine. What should I watch for while using this medicine? Visit your doctor for checks on your progress. This drug may make you feel generally unwell. This is not uncommon, as chemotherapy can affect healthy cells as well as cancer cells. Report any side effects. Continue  your course of treatment even though you feel ill unless your doctor tells you to stop. In some cases, you may be given additional medicines to help with side effects. Follow all directions for their use. Call your doctor or health care professional for advice if you get a fever, chills or sore throat, or other symptoms of a cold or flu. Do not treat yourself. This drug decreases your body's ability to fight infections. Try to avoid being around people who are sick. This medicine may increase your risk to bruise or bleed. Call your doctor or health care professional if you notice any unusual bleeding. Be careful brushing and flossing your teeth or using a toothpick because you may get an infection or bleed more easily. If you have any dental work done, tell your dentist you are receiving this medicine. Avoid taking products that contain aspirin, acetaminophen, ibuprofen, naproxen, or ketoprofen unless instructed by your doctor. These medicines may hide a fever. Call your doctor or health care professional if you get diarrhea or mouth sores. Do not treat yourself. To protect your kidneys, drink water or other fluids as directed while you are taking this medicine. Do not become pregnant while taking this medicine or for 6 months after stopping it. Women should inform their doctor if they wish to become pregnant or think they might be pregnant. Men should not father a child while taking this medicine and for 3 months after stopping it. This may interfere with the ability to father a child. You should talk to your doctor or health care professional if you are concerned about your fertility. There is a potential for serious side effects to an unborn child. Talk to your health care professional or pharmacist for more information. Do not breast-feed an infant while taking this medicine or for 1 week after stopping it. What side effects may I notice from receiving this medicine? Side effects that you should report  to your doctor or health care professional as soon as possible: -allergic reactions like skin rash, itching or hives, swelling of the face, lips, or tongue -breathing problems -redness, blistering, peeling or loosening of the skin, including inside the mouth -signs and symptoms of bleeding such as bloody or black, tarry stools; red or dark-brown urine; spitting up blood or brown material that looks like coffee grounds; red spots on the skin; unusual bruising or bleeding from the eye, gums, or nose -signs and symptoms of infection like fever or chills; cough; sore throat; pain or trouble passing urine -signs and symptoms of kidney injury like trouble passing urine or change in the amount of urine -signs and symptoms of liver injury like dark yellow or brown urine; general ill feeling or flu-like symptoms; light-colored stools; loss of appetite; nausea; right upper belly pain; unusually weak or tired; yellowing of the eyes or skin Side effects that usually do not require medical attention (report to your doctor or health care professional if they continue or are bothersome): -constipation -dizziness -mouth sores -nausea, vomiting -pain, tingling, numbness in the hands or feet -unusually weak or tired This list may not describe all possible side effects. Call your doctor for  medical advice about side effects. You may report side effects to FDA at 1-800-FDA-1088. Where should I keep my medicine? This drug is given in a hospital or clinic and will not be stored at home. NOTE: This sheet is a summary. It may not cover all possible information. If you have questions about this medicine, talk to your doctor, pharmacist, or health care provider.  2018 Elsevier/Gold Standard (2016-07-09 18:51:46)  Pembrolizumab injection What is this medicine? PEMBROLIZUMAB (pem broe liz ue mab) is a monoclonal antibody. It is used to treat melanoma, head and neck cancer, Hodgkin lymphoma, non-small cell lung cancer,  urothelial cancer, stomach cancer, and cancers that have a certain genetic condition. This medicine may be used for other purposes; ask your health care provider or pharmacist if you have questions. COMMON BRAND NAME(S): Keytruda What should I tell my health care provider before I take this medicine? They need to know if you have any of these conditions: -diabetes -immune system problems -inflammatory bowel disease -liver disease -lung or breathing disease -lupus -organ transplant -an unusual or allergic reaction to pembrolizumab, other medicines, foods, dyes, or preservatives -pregnant or trying to get pregnant -breast-feeding How should I use this medicine? This medicine is for infusion into a vein. It is given by a health care professional in a hospital or clinic setting. A special MedGuide will be given to you before each treatment. Be sure to read this information carefully each time. Talk to your pediatrician regarding the use of this medicine in children. While this drug may be prescribed for selected conditions, precautions do apply. Overdosage: If you think you have taken too much of this medicine contact a poison control center or emergency room at once. NOTE: This medicine is only for you. Do not share this medicine with others. What if I miss a dose? It is important not to miss your dose. Call your doctor or health care professional if you are unable to keep an appointment. What may interact with this medicine? Interactions have not been studied. Give your health care provider a list of all the medicines, herbs, non-prescription drugs, or dietary supplements you use. Also tell them if you smoke, drink alcohol, or use illegal drugs. Some items may interact with your medicine. This list may not describe all possible interactions. Give your health care provider a list of all the medicines, herbs, non-prescription drugs, or dietary supplements you use. Also tell them if you smoke,  drink alcohol, or use illegal drugs. Some items may interact with your medicine. What should I watch for while using this medicine? Your condition will be monitored carefully while you are receiving this medicine. You may need blood work done while you are taking this medicine. Do not become pregnant while taking this medicine or for 4 months after stopping it. Women should inform their doctor if they wish to become pregnant or think they might be pregnant. There is a potential for serious side effects to an unborn child. Talk to your health care professional or pharmacist for more information. Do not breast-feed an infant while taking this medicine or for 4 months after the last dose. What side effects may I notice from receiving this medicine? Side effects that you should report to your doctor or health care professional as soon as possible: -allergic reactions like skin rash, itching or hives, swelling of the face, lips, or tongue -bloody or black, tarry -breathing problems -changes in vision -chest pain -chills -constipation -cough -dizziness or feeling faint or lightheaded -  fast or irregular heartbeat -fever -flushing -hair loss -low blood counts - this medicine may decrease the number of white blood cells, red blood cells and platelets. You may be at increased risk for infections and bleeding. -muscle pain -muscle weakness -persistent headache -signs and symptoms of high blood sugar such as dizziness; dry mouth; dry skin; fruity breath; nausea; stomach pain; increased hunger or thirst; increased urination -signs and symptoms of kidney injury like trouble passing urine or change in the amount of urine -signs and symptoms of liver injury like dark urine, light-colored stools, loss of appetite, nausea, right upper belly pain, yellowing of the eyes or skin -stomach pain -sweating -weight loss Side effects that usually do not require medical attention (report to your doctor or health  care professional if they continue or are bothersome): -decreased appetite -diarrhea -tiredness This list may not describe all possible side effects. Call your doctor for medical advice about side effects. You may report side effects to FDA at 1-800-FDA-1088. Where should I keep my medicine? This drug is given in a hospital or clinic and will not be stored at home. NOTE: This sheet is a summary. It may not cover all possible information. If you have questions about this medicine, talk to your doctor, pharmacist, or health care provider.  2018 Elsevier/Gold Standard (2016-06-18 12:29:36)

## 2018-08-27 NOTE — Progress Notes (Signed)
START ON PATHWAY REGIMEN - Non-Small Cell Lung     A cycle is every 21 days:     Pembrolizumab      Pemetrexed      Carboplatin   **Always confirm dose/schedule in your pharmacy ordering system**  Patient Characteristics: Stage IV Metastatic, Nonsquamous, Initial Chemotherapy/Immunotherapy, PS = 0, 1, ALK Translocation Negative/Unknown and EGFR Mutation Negative/Non-Sensitizing/Unknown, PD-L1 Expression Positive 1-49% (TPS) / Negative / Not Tested / Awaiting Test Results  and Immunotherapy Candidate AJCC T Category: T2b Current Disease Status: Distant Metastases AJCC N Category: N2 AJCC M Category: M1c AJCC 8 Stage Grouping: IVB Histology: Nonsquamous Cell ROS1 Rearrangement Status: Negative T790M Mutation Status: Not Applicable - EGFR Mutation Negative/Unknown Other Mutations/Biomarkers: No Other Actionable Mutations NTRK Gene Fusion Status: Negative PD-L1 Expression Status: Quantity Not Sufficient Chemotherapy/Immunotherapy LOT: Initial Chemotherapy/Immunotherapy Molecular Targeted Therapy: Not Appropriate ALK Translocation Status: Negative EGFR Mutation Status: Negative/Wild Type BRAF V600E Mutation Status: Negative Performance Status: PS = 0, 1 Immunotherapy Candidate Status: Candidate for Immunotherapy Intent of Therapy: Non-Curative / Palliative Intent, Discussed with Patient

## 2018-08-27 NOTE — Assessment & Plan Note (Signed)
This is a very pleasant 77 year old white male with stage IV non-small cell carcinoma, adenocarcinoma who presented with large right lower lobe lung mass in addition to metastatic disease to the mediastinal lymph nodes as well as adrenal glands and bone disease involving the right shoulder, lumbar spine and pelvic bones.  He is currently receiving palliative radiation to his right shoulder with improvement in his pain and is scheduled to have SRS to his brain lesions next week.  He had guardant 360 testing and is here to discuss the results and treatment options.  The patient was seen with Dr. Julien Nordmann.  Guardant 360 testing was discussed with the patient and his wife which showed no actionable mutations.  Current disease stage, prognosis, and treatment options were discussed.  Treatment options were reviewed including treatment with carboplatin for an AUC of 5, Alimta 500 mg meter squared, and Keytruda 200 mg IV every 3 weeks versus a referral for palliative care/hospice.  The patient has expressed interest in pursuing treatment. Discussed with the patient adverse effects of this treatment including but not limited to alopecia, myelosuppression, nausea and vomiting, peripheral neuropathy, liver or renal dysfunction in addition to the adverse effects of immunotherapy including but not limited to immune mediated the skin rash, diarrhea, inflammation of the lung, kidney, liver, thyroid or other endocrine dysfunction. We will arrange for the patient to receive vitamin B 12 injection today.  A prescription for #10 mg every 6 hours as needed for nausea and vomiting was given to the patient.  He is currently taking folic acid 440 mcg daily and he will continue to take this.  The patient will have weekly labs while on chemotherapy.  Anticipate first dose on 09/08/2018.  He will have a chemotherapy education class prior to starting his treatment.  For IV access, we have referred him to Dr. Roxan Hockey for placement of a  Port-A-Cath.  I have sent a prescription for EMLA cream to his pharmacy.  The patient will follow-up in approximately 5 weeks for evaluation prior to cycle 2 of treatment.  Patient will complete radiation to his shoulder as directed by radiation oncology.  He will also received SRS to the brain lesions next week as scheduled.  For the suspicious anal lesion noted on the PET scan, he will keep his follow-up with Dr. Cristina Gong as scheduled.  The patient was advised to call immediately if he has any concerning symptoms in the interval. The patient voices understanding of current disease status and treatment options and is in agreement with the current care plan.  All questions were answered. The patient knows to call the clinic with any problems, questions or concerns. We can certainly see the patient much sooner if necessary.

## 2018-08-27 NOTE — Progress Notes (Signed)
[facility] OFFICE PROGRESS NOTE  Tony Ruff, MD [place] [address]  DIAGNOSIS: Stage IV non-small cell lung cancer, adenocarcinoma who presented with a large right lower lobe lung mass in addition to metastatic disease to the mediastinal lymph nodes, adrenal glands, and bone involving the right shoulder, lumbar fine, and pelvic bone.  Molecular Studies by Guardant 360: Showed no actionable mutations.  PRIOR THERAPY: None  CURRENT THERAPY:  1) palliative radiation to the right neck and shoulder.  He is scheduled to have SRS to the brain lesions on 09/01/2018. 2) palliative systemic chemotherapy with carboplatin for an AUC of 5, Alimta 500 mg/m, and Keytruda 200 mg IV every 3 weeks.  First dose expected on 09/08/2018.  INTERVAL HISTORY: Tony [name] 77 y.o. male returns for routine follow-up visit accompanied by his wife.  The patient is feeling fine today and has no specific complaints except for right shoulder pain only improving.  He is not currently taking any pain medication.  He denies fevers and chills.  Denies chest pain, shortness of breath, cough, hemoptysis.  Denies nausea, vomiting, diarrhea.  Has constipation which is well controlled with MiraLAX.  Denies recent weight loss or night sweats.  The patient had recent guardant 360 testing and is here to discuss the results and treatment options.  MEDICAL HISTORY: Past Medical History:  Diagnosis Date  . Anemia   . CAD (coronary artery disease)   . Constipation   . Gout   . Heart disease   . History of blood transfusion   . Hyperlipidemia   . Hypertension   . Mass of lower lobe of right lung   . Myocardial infarction ([place])    MI in 2000  . Peripheral vascular disease ([facility])    Abdominal Aortic Aneurysm  . Prostate cancer [name]) Nov. 2012  . Ulcer     ALLERGIES:  has No Known Allergies.  MEDICATIONS:  Current Outpatient Medications  Medication Sig Dispense Refill  .  acetaminophen (TYLENOL) 650 MG CR tablet Take 650 mg by mouth daily as needed for pain.     Marland Kitchen aspirin 81 MG tablet Take 81 mg by mouth every evening.     . bisacodyl (DULCOLAX) 10 MG suppository Place 1 suppository (10 mg total) rectally as needed for moderate constipation. 12 suppository 0  . colchicine (COLCRYS) 0.6 MG tablet Take 0.6 mg by mouth as needed (gout flare).     . folic acid (FOLVITE) 272 MCG tablet Take 800 mcg by mouth daily.     Marland Kitchen lidocaine-prilocaine (EMLA) cream Apply 1 application topically as needed. 30 g 1  . LORazepam (ATIVAN) 1 MG tablet Take 1-2 tablets by mouth 15 min prior to brain radiation planning, prior to brain radiation, and prior to future MRI, PRN anxiety. 6 tablet 0  . metoprolol tartrate (LOPRESSOR) 50 MG tablet Take 50 mg by mouth 2 (two) times daily.    . Multiple Vitamins-Minerals (MULTIVITAMIN WITH MINERALS) tablet Take 1 tablet by mouth daily.    . niacin 500 MG tablet Take 500 mg by mouth 2 (two) times daily.    . Omega-3 Fatty Acids (FISH OIL) 1200 MG CAPS Take 1,200 mg by mouth once.     Marland Kitchen omeprazole (PRILOSEC OTC) 20 MG tablet Take 10 mg by mouth 2 (two) times a week.    . polyethylene glycol (MIRALAX / GLYCOLAX) packet Take 17 g by mouth daily as needed for mild constipation.     . pravastatin (  PRAVACHOL) 10 MG tablet Take 10 mg by mouth every evening.     . prochlorperazine (COMPAZINE) 10 MG tablet Take 1 tablet (10 mg total) by mouth every 6 (six) hours as needed for nausea or vomiting. 30 tablet 1  . Psyllium (METAMUCIL FIBER PO) Take 1 Dose by mouth daily.    . ramipril (ALTACE) 10 MG capsule Take 10 mg by mouth every morning.     . triamterene-hydrochlorothiazide (DYAZIDE) 37.5-25 MG per capsule Take 0.5 capsules by mouth every morning.      No current facility-administered medications for this visit.     SURGICAL HISTORY:  Past Surgical History:  Procedure Laterality Date  . ANAL FISSURECTOMY    . CORONARY ARTERY BYPASS GRAFT     2000   . EYE SURGERY Left Nov. 2013   Cataract  . EYE SURGERY Right Feb. 2014   Cataract  . PR VEIN BYPASS GRAFT,AORTO-FEM-POP  2000  . PROSTATE SURGERY  Nov. 2012  . ROBOT ASSISTED LAPAROSCOPIC RADICAL PROSTATECTOMY  08/08/2011   Procedure: ROBOTIC ASSISTED LAPAROSCOPIC RADICAL PROSTATECTOMY LEVEL 2;  Surgeon: Dutch Gray, MD;  Location: [facility];  Service: Urology;  Laterality: Bilateral;  with Bilateral Pelvic Lymphadenectomy   . SHOULDER SURGERY  1980'S  . VIDEO BRONCHOSCOPY WITH ENDOBRONCHIAL ULTRASOUND N/A 08/06/2018   Procedure: VIDEO BRONCHOSCOPY WITH ENDOBRONCHIAL ULTRASOUND;  Surgeon: Melrose Nakayama, MD;  Location: [facility];  Service: Thoracic;  Laterality: N/A;    REVIEW OF SYSTEMS:   Review of Systems  Constitutional: Negative for appetite change, chills, fatigue, fever and unexpected weight change.  HENT:   Negative for mouth sores, nosebleeds, sore throat and trouble swallowing.   Eyes: Negative for eye problems and icterus.  Respiratory: Negative for cough, hemoptysis, shortness of breath and wheezing.   Cardiovascular: Negative for chest pain and leg swelling.  Gastrointestinal: Negative for abdominal pain, constipation, diarrhea, nausea and vomiting.  Genitourinary: Negative for bladder incontinence, difficulty urinating, dysuria, frequency and hematuria.   Musculoskeletal: Negative for back pain, gait problem.  Positive for right shoulder and neck pain. Skin: Negative for itching and rash.  Neurological: Negative for dizziness, extremity weakness, gait problem, headaches, light-headedness and seizures.  Hematological: Negative for adenopathy. Does not bruise/bleed easily.  Psychiatric/Behavioral: Negative for confusion, depression and sleep disturbance. The patient is not nervous/anxious.     PHYSICAL EXAMINATION:  Blood pressure 137/70, pulse (!) 55, temperature 98.5 F (36.9 C), temperature source Oral, resp. rate 18, height 5\' 9"  (1.753 m), weight 174 lb 11.2 oz (79.2  kg), SpO2 99 %.  ECOG PERFORMANCE STATUS: 1 - Symptomatic but completely ambulatory  Physical Exam  Constitutional: Oriented to person, place, and time and well-developed, well-nourished, and in no distress. No distress.  HENT:  Head: Normocephalic and atraumatic.  Mouth/Throat: Oropharynx is clear and moist. No oropharyngeal exudate.  Eyes: Conjunctivae are normal. Right eye exhibits no discharge. Left eye exhibits no discharge. No scleral icterus.  Neck: Normal range of motion. Neck supple.  Cardiovascular: Normal rate, regular rhythm, normal heart sounds and intact distal pulses.   Pulmonary/Chest: Effort normal and breath sounds normal. No respiratory distress. No wheezes. No rales.  Abdominal: Soft. Bowel sounds are normal. Exhibits no distension and no mass. There is no tenderness.  Musculoskeletal: Normal range of motion. Exhibits no edema.  Lymphadenopathy:    No cervical adenopathy.  Neurological: Alert and oriented to person, place, and time. Exhibits normal muscle tone. Gait normal. Coordination normal.  Skin: Skin is warm and dry. No rash noted.  Not diaphoretic. No erythema. No pallor.  Psychiatric: Mood, memory and judgment normal.  Vitals reviewed.  LABORATORY DATA: Lab Results  Component Value Date   WBC 6.1 08/27/2018   HGB 14.1 08/27/2018   HCT 42.2 08/27/2018   MCV 99.1 08/27/2018   PLT 156 08/27/2018      Chemistry      Component Value Date/Time   NA 138 08/27/2018 1130   NA 139 08/10/2012 1328   K 4.3 08/27/2018 1130   K 4.1 08/10/2012 1328   CL 102 08/27/2018 1130   CL 105 08/10/2012 1328   CO2 25 08/27/2018 1130   CO2 29 08/10/2012 1328   BUN 14 08/27/2018 1130   BUN 18.0 08/10/2012 1328   CREATININE 0.99 08/27/2018 1130   CREATININE 1.1 08/10/2012 1328      Component Value Date/Time   CALCIUM 9.9 08/27/2018 1130   CALCIUM 9.0 08/10/2012 1328   ALKPHOS 107 08/27/2018 1130   ALKPHOS 144 08/10/2012 1328   AST 20 08/27/2018 1130   AST 42 (H)  08/10/2012 1328   ALT 14 08/27/2018 1130   ALT 55 08/10/2012 1328   BILITOT 0.5 08/27/2018 1130   BILITOT 0.91 08/10/2012 1328       RADIOGRAPHIC STUDIES:  Dg Chest 2 View  Result Date: 08/05/2018 CLINICAL DATA:  Preoperative examination prior to lung biopsy of a right lower lobe mass. History of coronary artery disease and CABG. Previous MI. EXAM: CHEST - 2 VIEW COMPARISON:  PA and lateral chest x-ray of July 16, 2018 FINDINGS: The lungs are well-expanded. There is no focal infiltrate. There is no pleural effusion. There is density present in the posteromedial aspect of the right lower lobe. The heart and pulmonary vascularity are normal. There are post CABG changes. There is calcification in the wall of the aortic arch. The bony thorax exhibits no acute abnormality. IMPRESSION: Persistent right lower lobe airspace opacity consistent with a known mass. Previous CABG. Otherwise no acute cardiopulmonary abnormality. Thoracic aortic atherosclerosis. Electronically Signed   By: David  Martinique M.D.   On: 08/05/2018 07:03   Mr Tony [name] ID Contrast  Result Date: 08/22/2018 CLINICAL DATA:  Treatment planning of metastatic lung cancer. EXAM: MRI HEAD WITHOUT AND WITH CONTRAST TECHNIQUE: Multiplanar, multiecho pulse sequences of the brain and surrounding structures were obtained without and with intravenous contrast. CONTRAST:  69mL MULTIHANCE GADOBENATE DIMEGLUMINE 529 MG/ML IV SOLN COMPARISON:  MRI of the head August 01, 2018 FINDINGS: BRAIN New Lesions: None. Larger lesions: None. Stable or Smaller lesions: 5 cerebellar enhancing metastasis noted measuring less than 3 mm ( 3 RIGHT, 1 LEFT and 1 for and nodules annotated on series 10 image 25, 30, 35, 36 and 45). OTHER INTRACRANIAL CONTENTS: No reduced diffusion to suggest acute ischemia. No susceptibility artifact to suggest hemorrhage. The ventricles and sulci are normal for patient's age. A few scattered subcentimeter supratentorial white matter  FLAIR T2 hyperintensities compatible with mild chronic small vessel ischemic changes, less than expected for age. No suspicious parenchymal signal, masses, mass effect. No abnormal extra-axial fluid collections. No extra-axial masses. VASCULAR: Normal major intracranial vascular flow voids present at skull base. SKULL AND UPPER CERVICAL SPINE: No abnormal sellar expansion. Abnormal reduced diffusion, enhancement and low T2 signal LEFT C1 lateral mass. Craniocervical junction maintained. SINUSES/ORBITS: The mastoid air-cells and included paranasal sinuses are well-aerated.The included ocular globes and orbital contents are non-suspicious. Status post bilateral ocular lens implants. OTHER: None. IMPRESSION: 1. Stable appearance of 5 posterior fossa metastasis measuring to 3  mm. LEFT C1 osseous metastasis. 2. No acute intracranial process. Electronically Signed   By: Tony [name] M.D.   On: 08/22/2018 20:59   Mr Tony [name] GB Contrast  Result Date: 08/02/2018 CLINICAL DATA:  Initial evaluation for non-small cell lung cancer, staging. EXAM: MRI HEAD WITHOUT AND WITH CONTRAST TECHNIQUE: Multiplanar, multiecho pulse sequences of the brain and surrounding structures were obtained without and with intravenous contrast. CONTRAST:  80 cc of Gadavist. COMPARISON:  None available. FINDINGS: Brain: Generalized age-related cerebral atrophy. Minimal chronic microvascular ischemic changes present within the periventricular and deep white matter both cerebral hemispheres. Probable tiny remote lacunar infarct noted within the right thalamus. No evidence for acute infarct. No encephalomalacia to suggest chronic cortical infarction. No foci of susceptibility artifact to suggest acute or chronic intracranial hemorrhage. Subtle 4 mm focus of nodular enhancement seen within the right cerebellar hemisphere, suspicious for possible small metastasis (series 11, image 11). Additional tiny 3 mm nodular focus of enhancement seen slightly  more inferiorly within the right cerebellum (series 11, image 10). Punctate focus of enhancement at the central cerebellar vermis may reflect a third small lesion (series 11, image 15). No other definite intracranial metastases or other foci of abnormal enhancement identified. No other mass lesion. No midline shift or mass effect. No hydrocephalus. No extra-axial fluid collection. Pituitary gland within normal limits. Midline structures intact and normal. Vascular: Major intracranial vascular flow voids maintained. Skull and upper cervical spine: Abnormal marrow replacing lesion seen involving the left lateral mass of C1 (series 11, image 2), most consistent with an osseous metastasis. No other focal marrow replacing lesions identified. Scalp soft tissues unremarkable. Sinuses/Orbits: Patient status post ocular lens replacement bilaterally. Paranasal sinuses are clear. No mastoid effusion. Inner ear structures grossly within normal limits. Other: None. IMPRESSION: 1. Three total nodular foci of enhancement measuring up to 4 mm involving the central and right cerebellum as above, highly suspicious for small intracranial metastases. No significant edema or other complication at this time. 2. Osseous metastasis involving the left lateral mass of C1. 3. No other MRI evidence for intracranial metastatic disease. No other acute intracranial abnormality. Electronically Signed   By: Tony [name] M.D.   On: 08/02/2018 04:16   Dg Chest Port 1 View  Result Date: 08/06/2018 CLINICAL DATA:  Status post bronchoscopy EXAM: PORTABLE CHEST 1 VIEW COMPARISON:  08/04/2018 FINDINGS: Cardiac shadow is stable. Postsurgical changes are again seen. Fullness in the right hilum is again identified and stable consistent with the known right-sided lung mass. The lungs are well aerated. No pneumothorax is noted following bronchoscopy. No bony abnormality is seen. IMPRESSION: No post bronchoscopy pneumothorax is noted.  Electronically Signed   By: Inez Catalina M.D.   On: 08/06/2018 19:50   Vas [place] Burnard Bunting With/wo Tbi  Result Date: 08/17/2018 LOWER EXTREMITY DOPPLER STUDY Indications: Claudication. High Risk Factors: Hypertension, hyperlipidemia, coronary artery disease.  Performing Technologist: Ronal Fear RVS, RCS  Examination Guidelines: A complete evaluation includes at minimum, Doppler waveform signals and systolic blood pressure reading at the level of bilateral brachial, anterior tibial, and posterior tibial arteries, when vessel segments are accessible. Bilateral testing is considered an integral part of a complete examination. Photoelectric Plethysmograph (PPG) waveforms and toe systolic pressure readings are included as required and additional duplex testing as needed. Limited examinations for reoccurring indications may be performed as noted.  ABI Findings: +---------+------------------+-----+----------+--------+ Right    Rt Pressure (mmHg)IndexWaveform  Comment  +---------+------------------+-----+----------+--------+ Brachial 133                                       +---------+------------------+-----+----------+--------+  PTA      92                0.69 monophasic         +---------+------------------+-----+----------+--------+ DP       91                0.68 monophasic         +---------+------------------+-----+----------+--------+ Great Toe57                0.43                    +---------+------------------+-----+----------+--------+ +---------+------------------+-----+----------+-------+ Left     Lt Pressure (mmHg)IndexWaveform  Comment +---------+------------------+-----+----------+-------+ Brachial 133                                      +---------+------------------+-----+----------+-------+ PTA      83                0.62 monophasic        +---------+------------------+-----+----------+-------+ DP       93                0.70 monophasic         +---------+------------------+-----+----------+-------+ Great Toe55                0.41                   +---------+------------------+-----+----------+-------+ +-------+-----------+-----------+------------+------------+ ABI/TBIToday's ABIToday's TBIPrevious ABIPrevious TBI +-------+-----------+-----------+------------+------------+ Right  0.69       0.43                                +-------+-----------+-----------+------------+------------+ Left   0.70       0.41                                +-------+-----------+-----------+------------+------------+  Summary: Right: Resting right ankle-brachial index indicates moderate right lower extremity arterial disease. The right toe-brachial index is abnormal. Left: Resting left ankle-brachial index indicates moderate left lower extremity arterial disease. The left toe-brachial index is abnormal.  *See table(s) above for measurements and observations.  Electronically signed by Harold Barban MD on 08/17/2018 at 10:31:38 AM.   Final    Vas [place] Aaa Duplex  Result Date: 08/17/2018 ABDOMINAL AORTA STUDY Indications: Follow up exam for known AAA.  Performing Technologist: Ronal Fear RVS, RCS  Examination Guidelines: A complete evaluation includes B-mode imaging, spectral Doppler, color Doppler, and power Doppler as needed of all accessible portions of each vessel. Bilateral testing is considered an integral part of a complete examination. Limited examinations for reoccurring indications may be performed as noted.  Abdominal Aorta Findings: +-----------+-------+----------+----------+--------+--------+--------+ Location   AP (cm)Trans (cm)PSV (cm/s)WaveformThrombusComments +-----------+-------+----------+----------+--------+--------+--------+ Proximal   1.89   1.93      50                                 +-----------+-------+----------+----------+--------+--------+--------+ Mid        3.31   3.49      27                 Present fusiform +-----------+-------+----------+----------+--------+--------+--------+ Distal     1.74   2.04      44                                 +-----------+-------+----------+----------+--------+--------+--------+  RT CIA Prox0.8    0.8       194                                +-----------+-------+----------+----------+--------+--------+--------+ LT CIA Prox1.0    1.0       142                                +-----------+-------+----------+----------+--------+--------+--------+  Summary: Abdominal Aorta: There is evidence of abnormal dilitation of the Mid Abdominal aorta. The largest aortic measurement is 3.5 cm. The largest aortic diameter has decreased compared to prior exam. Previous diameter measurement was 3.9 cm obtained on 08/19/2017.  *See table(s) above for measurements and observations.  Electronically signed by Harold Barban MD on 08/17/2018 at 10:33:09 AM.   Final      ASSESSMENT/PLAN:  Adenocarcinoma, lung, right [facility]) This is a very pleasant 77 year old white male with stage IV non-small cell carcinoma, adenocarcinoma who presented with large right lower lobe lung mass in addition to metastatic disease to the mediastinal lymph nodes as well as adrenal glands and bone disease involving the right shoulder, lumbar spine and pelvic bones.  He is currently receiving palliative radiation to his right shoulder with improvement in his pain and is scheduled to have SRS to his brain lesions next week.  He had guardant 360 testing and is here to discuss the results and treatment options.  The patient was seen with Dr. Julien Nordmann.  Guardant 360 testing was discussed with the patient and his wife which showed no actionable mutations.  Current disease stage, prognosis, and treatment options were discussed.  Treatment options were reviewed including treatment with carboplatin for an AUC of 5, Alimta 500 mg meter squared, and Keytruda 200 mg IV every 3 weeks versus a referral for  palliative care/hospice.  The patient has expressed interest in pursuing treatment. Discussed with the patient adverse effects of this treatment including but not limited to alopecia, myelosuppression, nausea and vomiting, peripheral neuropathy, liver or renal dysfunction in addition to the adverse effects of  immunotherapy including but not limited to immune mediated the skin rash, diarrhea, inflammation of the lung, kidney, liver, thyroid or other endocrine dysfunction. We will arrange for the patient to receive vitamin B 12 injection today.  A prescription for #10 mg every 6 hours as needed for nausea and vomiting was given to the patient.  He is currently taking folic acid 604 mcg daily and he will continue to take this.  The patient will have weekly labs while on chemotherapy.  Anticipate first dose on 09/08/2018.  He will have a chemotherapy education class prior to starting his treatment.  For IV access, we have referred him to Dr. Roxan Hockey for placement of a Port-A-Cath.  I have sent a prescription for EMLA cream to his pharmacy.  The patient will follow-up in approximately 5 weeks for evaluation prior to cycle 2 of treatment.  Patient will complete radiation to his shoulder as directed by radiation oncology.  He will also received SRS to the brain lesions next week as scheduled.  For the suspicious anal lesion noted on the PET scan, he will keep his follow-up with Dr. Cristina Gong as scheduled.  The patient was advised to call immediately if he has any concerning symptoms in the interval. The patient voices understanding of current disease status and treatment options and is  in agreement with the current care plan.  All questions were answered. The patient knows to call the clinic with any problems, questions or concerns. We can certainly see the patient much sooner if necessary.   Orders Placed This Encounter  Procedures  . CBC with Differential (Cancer Center Only)    Standing Status:    Standing    Number of Occurrences:   20    Standing Expiration Date:   08/28/2019  . CMP ([facility] only)    Standing Status:   Standing    Number of Occurrences:   20    Standing Expiration Date:   08/28/2019  . TSH    Standing Status:   Standing    Number of Occurrences:   20    Standing Expiration Date:   08/28/2019  . Ambulatory referral to Cardiothoracic Surgery    Referral Priority:   Routine    Referral Type:   Surgical    Referral Reason:   Specialty Services Required    Referred to Provider:   Melrose Nakayama, MD    Requested Specialty:   Cardiothoracic Surgery    Number of Visits Requested:   1     Tony [name], [place], AGPCNP-BC, AOCNP 08/27/18   ADDENDUM: Hematology/Oncology Attending: I had a face-to-face encounter with the patient.  I recommended his care plan.  This is a very pleasant 77 years old white male with a stage IV non-small cell lung cancer, adenocarcinoma with no actionable mutations on the molecular studies by guardant 360. The patient is currently undergoing palliative radiotherapy to the right shoulder as well as the metastatic disease in the neck.  He is also scheduled for stereotactic radiotherapy to the brain next week. I had a lengthy discussion with the patient and his wife about his current disease stage, prognosis and treatment options. I explained to the patient that he has incurable condition and all the treatment will be of palliative nature.  He was given the option of palliative care versus palliative systemic chemotherapy with carboplatin for AUC of 5, Alimta 500 mg/M2 and Keytruda 200 mg IV every 3 weeks. I discussed with the patient the adverse effect of this treatment including but not limited to mild alopecia, myelosuppression, nausea and vomiting, peripheral neuropathy, liver or renal dysfunction as well as the adverse effect of immunotherapy.  The patient is interested in proceeding with systemic treatment. He is expected to start  the first cycle of this treatment on September 08, 2018. He will receive vitamin B12 injection today. We will call his pharmacy with prescription for Compazine, folic acid as well as EMLA cream. We will refer the patient to Dr. Roxan Hockey for consideration of Port-A-Cath placement before his treatment. The patient will come back for follow-up visit with the start of cycle #2. He was advised to call immediately if he has any concerning symptoms in the interval.  Disclaimer: This note was dictated with voice recognition software. Similar sounding words can inadvertently be transcribed and may be missed upon review. Eilleen Kempf, MD 08/28/18

## 2018-08-27 NOTE — Telephone Encounter (Signed)
Spoke with patient about upcoming appointments.

## 2018-08-28 ENCOUNTER — Ambulatory Visit
Admission: RE | Admit: 2018-08-28 | Discharge: 2018-08-28 | Disposition: A | Discharge status: Home / Self Care | Payer: Medicare Other | Source: Ambulatory Visit | Attending: Radiation Oncology | Admitting: Radiation Oncology

## 2018-08-28 ENCOUNTER — Telehealth: Payer: Self-pay | Admitting: Internal Medicine

## 2018-08-28 ENCOUNTER — Other Ambulatory Visit: Payer: Self-pay | Admitting: *Deleted

## 2018-08-28 DIAGNOSIS — C3490 Malignant neoplasm of unspecified part of unspecified bronchus or lung: Secondary | ICD-10-CM

## 2018-08-28 DIAGNOSIS — C7951 Secondary malignant neoplasm of bone: Secondary | ICD-10-CM | POA: Diagnosis not present

## 2018-08-28 NOTE — Telephone Encounter (Signed)
Called patient and verified upcoming appointments.  Printed and mailed calendar.

## 2018-08-31 ENCOUNTER — Telehealth: Payer: Self-pay | Admitting: Internal Medicine

## 2018-08-31 ENCOUNTER — Ambulatory Visit
Admission: RE | Admit: 2018-08-31 | Discharge: 2018-08-31 | Disposition: A | Discharge status: Home / Self Care | Payer: Medicare Other | Source: Ambulatory Visit | Attending: Radiation Oncology | Admitting: Radiation Oncology

## 2018-08-31 DIAGNOSIS — C7951 Secondary malignant neoplasm of bone: Secondary | ICD-10-CM | POA: Diagnosis not present

## 2018-08-31 NOTE — Telephone Encounter (Signed)
Patient came and got a print out of his schedule.

## 2018-09-01 ENCOUNTER — Ambulatory Visit
Admission: RE | Admit: 2018-09-01 | Discharge: 2018-09-01 | Disposition: A | Discharge status: Home / Self Care | Payer: Medicare Other | Source: Ambulatory Visit | Attending: Radiation Oncology | Admitting: Radiation Oncology

## 2018-09-01 ENCOUNTER — Ambulatory Visit: Payer: Medicare Other

## 2018-09-01 DIAGNOSIS — C7951 Secondary malignant neoplasm of bone: Secondary | ICD-10-CM | POA: Diagnosis not present

## 2018-09-01 DIAGNOSIS — Z923 Personal history of irradiation: Secondary | ICD-10-CM

## 2018-09-01 DIAGNOSIS — C7931 Secondary malignant neoplasm of brain: Secondary | ICD-10-CM

## 2018-09-01 HISTORY — DX: Personal history of irradiation: Z92.3

## 2018-09-02 ENCOUNTER — Ambulatory Visit
Admission: RE | Admit: 2018-09-02 | Discharge: 2018-09-02 | Disposition: A | Discharge status: Home / Self Care | Payer: Medicare Other | Source: Ambulatory Visit | Attending: Radiation Oncology | Admitting: Radiation Oncology

## 2018-09-02 DIAGNOSIS — C7951 Secondary malignant neoplasm of bone: Secondary | ICD-10-CM | POA: Diagnosis not present

## 2018-09-02 NOTE — Progress Notes (Signed)
  Radiation Oncology         [phone] ________________________________ Outpatient  Stereotactic Treatment Procedure Note  Name: Tony [name] MRN: [id number]  Date: 09/01/2018  DOB: 29-Oct-1940  SPECIAL TREATMENT PROCEDURE  3D TREATMENT PLANNING AND DOSIMETRY:  The patient's radiation plan was reviewed and approved by neurosurgery and radiation oncology prior to treatment.  It showed 3-dimensional radiation distributions overlaid onto the planning CT/MRI image set.  The [facility] for the target structures as well as the organs at risk were reviewed. The documentation of the 3D plan and dosimetry are filed in the radiation oncology EMR.  NARRATIVE:  Tony [name] was brought to the TrueBeam stereotactic radiation treatment machine and placed supine on the CT couch. The head frame was applied, and the patient was set up for stereotactic radiosurgery.  Neurosurgery was present for the set-up and delivery  SIMULATION VERIFICATION:  In the couch zero-angle position, the patient underwent Exactrac imaging using the Brainlab system with orthogonal KV images.  These were carefully aligned and repeated to confirm treatment position for each of the isocenters.  The Exactrac snap film verification was repeated at each couch angle.  SPECIAL TREATMENT PROCEDURE: Tony [name] received stereotactic radiosurgery to the following targets with a total of 6 VMAT beams: PTV1 Rt Post Fossa 63mm 20 Gy PTV2 Rt  Post Fossa 50mm 20 Gy PTV3 Mid Post Fossa 21mm 20 Gy PTV4 Rt Post Fossa 69mm 20 Gy PTV5 Lt Post Fossa 62mm 20Gy   This constitutes a special treatment procedure due to the ablative dose delivered and the technical nature of treatment.  This highly technical modality of treatment ensures that the ablative dose is centered on the patient's tumor while sparing normal tissues from excessive dose and risk of detrimental effects.  STEREOTACTIC TREATMENT MANAGEMENT:  Following delivery, the patient was  transported to nursing in stable condition and monitored for possible acute effects. The patient tolerated treatment without significant acute effects, and was discharged to home in stable condition.    PLAN: Follow-up in one month.  ________________________________   Eppie Gibson, MD

## 2018-09-03 ENCOUNTER — Encounter: Payer: Self-pay | Admitting: Radiation Oncology

## 2018-09-03 ENCOUNTER — Encounter: Payer: Self-pay | Admitting: Internal Medicine

## 2018-09-03 ENCOUNTER — Inpatient Hospital Stay: Payer: Medicare Other

## 2018-09-03 ENCOUNTER — Telehealth: Payer: Self-pay | Admitting: Internal Medicine

## 2018-09-03 NOTE — Telephone Encounter (Signed)
Patient stopped by to have port added to his 1/28 visits when he'll have chemo. Added port from lab and gave patient an updated schedule.

## 2018-09-03 NOTE — Progress Notes (Signed)
  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 Spine, C1 / 30 Gy in 10 fractions of 3 Gy 2. Scapula, Right Glenoid / 30 Gy in 10 fractions of 3 Gy  Beams/energy:    1. 3D, photons / 6X 2. Complex Isodose, photons / 6X//10X  Narrative: The patient tolerated radiation treatment relatively well. He denied shortness of breath, changes in appetite, and cough throughout treatment. He reported difficulty swallowing that resolved by the end of treatment. He reported pain to his left upper neck and right shoulder that caused sleep issues towards the beginning of treatments that had improved by the end.   Patient also underwent one SRS treatment during this time under Dr. Isidore Moos for brain metastasis (see separate note).  Plan: The patient has completed radiation treatment. The patient will return to radiation oncology clinic for routine followup in one month. I advised them to call or return sooner if they have any questions or concerns related to their recovery or treatment.  -----------------------------------  Blair Promise, PhD, MD  This document serves as a record of services personally performed by Gery Pray, MD. It was created on his behalf by Wilburn Mylar, a trained medical scribe. The creation of this record is based on the scribe's personal observations and the provider's statements to them. This document has been checked and approved by the attending provider.

## 2018-09-03 NOTE — Progress Notes (Signed)
  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   Patient was also being treated by Dr. Sondra Come for C1 and right glenoid metastases (see separate note).  Plan: The patient has completed radiation treatment. The patient will return to radiation oncology clinic for routine followup in one month. I advised them to call or return sooner if they have any questions or concerns related to their recovery or treatment.  -----------------------------------  Eppie Gibson, MD  This document serves as a record of services personally performed by Eppie Gibson, MD. It was created on his behalf by Wilburn Mylar, a trained medical scribe. The creation of this record is based on the scribe's personal observations and the provider's statements to them. This document has been checked and approved by the attending provider.

## 2018-09-03 NOTE — Progress Notes (Signed)
Met with patient and spouse after chemo ed class to introduce myself as Arboriculturist and to offer available resources.  Asked about insurance DED/OOP for this year. They have no deductible and haven't quite met OOP. Advised there are currently no copay assistance programs available for diagnosis or insurance coverage but we can revisit in the new year when the plan renews. They verbalized understanding.  Discussed the one-time $700 Engineer, drilling. They state they are over income quoted for the grant.   Gave my card for any additional financial questions or concerns.

## 2018-09-04 LAB — GUARDANT 360

## 2018-09-07 ENCOUNTER — Telehealth: Payer: Self-pay | Admitting: *Deleted

## 2018-09-07 NOTE — Telephone Encounter (Signed)
Spoke to pt regarding treatment 12/18 and 12/23 procedure. Discussed with pt and spouse MD recommends to continue as scheduled with treatment.If pt would like to defer treatment until 01/07-8, this is ok too. Per MD, advised pt and wife pt's cancer is fast growing and the sooner he can start treatment the better. Pt and wife agreed to start treatment the first week of January, however they have too many things going on and with the holidays would prefer not to begin treatment until 09/23/18 or after. Discussed with pt and wife there may not be availability for lab/MD and treatment the first week of jan. Message to scheduler.

## 2018-09-09 ENCOUNTER — Inpatient Hospital Stay: Payer: Medicare Other

## 2018-09-09 NOTE — Progress Notes (Signed)
  Name: Tony [name]  MRN: [id number]  Date: 09/01/2018   DOB: 10-10-1940  Stereotactic Radiosurgery Operative Note  PRE-OPERATIVE DIAGNOSIS:  Multiple Brain Metastases  POST-OPERATIVE DIAGNOSIS:  Multiple Brain Metastases  PROCEDURE:  Stereotactic Radiosurgery  SURGEON:  Judith Part, MD  NARRATIVE: The patient underwent a radiation treatment planning session in the radiation oncology simulation suite under the care of the radiation oncology physician and physicist.  I participated closely in the radiation treatment planning afterwards. The patient underwent planning CT which was fused to 3T high resolution MRI with 1 mm axial slices.  These images were fused on the planning system.  We contoured the gross target volumes and subsequently expanded this to yield the Planning Target Volume. I actively participated in the planning process.  I helped to define and review the target contours and also the contours of the optic pathway, eyes, brainstem and selected nearby organs at risk.  All the dose constraints for critical structures were reviewed and compared to AAPM Task Group 101.  The prescription dose conformity was reviewed.  I approved the plan electronically.    Accordingly, Tony [name] was brought to the TrueBeam stereotactic radiation treatment linac and placed in the custom immobilization mask.  The patient was aligned according to the IR fiducial markers with BrainLab Exactrac, then orthogonal x-rays were used in ExacTrac with the 6DOF robotic table and the shifts were made to align the patient  Tony [name] received stereotactic radiosurgery uneventfully.    Lesions treated:  5   Complex lesions treated:  0 (>3.5 cm, <85mm of optic path, or within the brainstem)   The detailed description of the procedure is recorded in the radiation oncology procedure note.  I was present for the duration of the procedure.  DISPOSITION:  Following delivery, the patient was transported  to nursing in stable condition and monitored for possible acute effects to be discharged to home in stable condition with follow-up in one month.  Judith Part, MD 09/09/2018 8:53 AM

## 2018-09-09 NOTE — Addendum Note (Signed)
Encounter addended by: Judith Part, MD on: 09/09/2018 8:53 AM  Actions taken: Clinical Note Signed

## 2018-09-15 ENCOUNTER — Other Ambulatory Visit: Payer: Medicare Other

## 2018-09-15 NOTE — Pre-Procedure Instructions (Signed)
Tony [name]  09/15/2018      [id number] - [place], [place] [address]  [place] [address] Phone: [phone] Fax: [phone]    Your procedure is scheduled on January 2nd.  Report to [facility] Admitting at 0930 A.M.  Call this number if you have problems the morning of surgery:  [phone]   Remember:  Do not eat or drink after midnight.    Take these medicines the morning of surgery with A SIP OF WATER  acetaminophen (TYLENOL)  If needed bisacodyl (DULCOLAX) if needed colchicine (COLCRYS) if needed metoprolol tartrate (LOPRESSOR) omeprazole (PRILOSEC OTC)  If needed  Follow your surgeon's instructions on when to stop Asprin.  If no instructions were given by your surgeon then you will need to call the office to get those instructions.    7 days prior to surgery STOP taking any Aspirin (unless otherwise instructed by your surgeon), Aleve, Naproxen, Ibuprofen, Motrin, Advil, Goody's, BC's, all herbal medications, fish oil, and all vitamins.     Do not wear jewelry.  Do not wear lotions, powders, or colognes, or deodorant.  Men may shave face and neck.  Do not bring valuables to the hospital.  [facility] is not responsible for any belongings or valuables.  Contacts, dentures or bridgework may not be worn into surgery.  Leave your suitcase in the car.  After surgery it may be brought to your room.  For patients admitted to the hospital, discharge time will be determined by your treatment team.  Patients discharged the day of surgery will not be allowed to drive home.    [facility]- Preparing For Surgery  Before surgery, you can play an important role. Because skin is not sterile, your skin needs to be as free of germs as possible. You can reduce the number of germs on your skin by washing with CHG (chlorahexidine gluconate) Soap before surgery.  CHG is an antiseptic  cleaner which kills germs and bonds with the skin to continue killing germs even after washing.    Oral Hygiene is also important to reduce your risk of infection.  Remember - BRUSH YOUR TEETH THE MORNING OF SURGERY WITH YOUR REGULAR TOOTHPASTE  Please do not use if you have an allergy to CHG or antibacterial soaps. If your skin becomes reddened/irritated stop using the CHG.  Do not shave (including legs and underarms) for at least 48 hours prior to first CHG shower. It is OK to shave your face.  Please follow these instructions carefully.   1. Shower the NIGHT BEFORE SURGERY and the MORNING OF SURGERY with CHG.   2. If you chose to wash your hair, wash your hair first as usual with your normal shampoo.  3. After you shampoo, rinse your hair and body thoroughly to remove the shampoo.  4. Use CHG as you would any other liquid soap. You can apply CHG directly to the skin and wash gently with a scrungie or a clean washcloth.   5. Apply the CHG Soap to your body ONLY FROM THE NECK DOWN.  Do not use on open wounds or open sores. Avoid contact with your eyes, ears, mouth and genitals (private parts). Wash Face and genitals (private parts)  with your normal soap.  6. Wash thoroughly, paying special attention to the area where your surgery will be performed.  7. Thoroughly rinse your body with warm water from the neck  down.  8. DO NOT shower/wash with your normal soap after using and rinsing off the CHG Soap.  9. Pat yourself dry with a CLEAN TOWEL.  10. Wear CLEAN PAJAMAS to bed the night before surgery, wear comfortable clothes the morning of surgery  11. Place CLEAN SHEETS on your bed the night of your first shower and DO NOT SLEEP WITH PETS.    Day of Surgery:  Do not apply any deodorants/lotions.  Please wear clean clothes to the hospital/surgery center.   Remember to brush your teeth WITH YOUR REGULAR TOOTHPASTE.    Please read over the following fact sheets that you were  given.

## 2018-09-17 ENCOUNTER — Telehealth: Payer: Self-pay

## 2018-09-17 ENCOUNTER — Other Ambulatory Visit: Payer: Self-pay

## 2018-09-17 ENCOUNTER — Encounter (HOSPITAL_COMMUNITY): Payer: Self-pay

## 2018-09-17 ENCOUNTER — Encounter (HOSPITAL_COMMUNITY)
Admission: RE | Admit: 2018-09-17 | Discharge: 2018-09-17 | Disposition: A | Discharge status: Home / Self Care | Payer: Medicare Other | Source: Ambulatory Visit | Attending: Thoracic Surgery (Cardiothoracic Vascular Surgery) | Admitting: Thoracic Surgery (Cardiothoracic Vascular Surgery)

## 2018-09-17 DIAGNOSIS — Z01812 Encounter for preprocedural laboratory examination: Secondary | ICD-10-CM | POA: Diagnosis present

## 2018-09-17 DIAGNOSIS — C3490 Malignant neoplasm of unspecified part of unspecified bronchus or lung: Secondary | ICD-10-CM

## 2018-09-17 LAB — COMPREHENSIVE METABOLIC PANEL
ALK PHOS: 76 U/L (ref 38–126)
ALT: 15 U/L (ref 0–44)
AST: 19 U/L (ref 15–41)
Albumin: 3.6 g/dL (ref 3.5–5.0)
Anion gap: 10 (ref 5–15)
BUN: 11 mg/dL (ref 8–23)
CALCIUM: 9.3 mg/dL (ref 8.9–10.3)
CO2: 23 mmol/L (ref 22–32)
Chloride: 106 mmol/L (ref 98–111)
Creatinine, Ser: 0.97 mg/dL (ref 0.61–1.24)
GFR calc Af Amer: >60 mL/min (ref >60)
GFR calc non Af Amer: >60 mL/min (ref >60)
Glucose, Bld: 108 mg/dL — ABNORMAL HIGH (ref 70–99)
Potassium: 4 mmol/L (ref 3.5–5.1)
Sodium: 139 mmol/L (ref 135–145)
Total Bilirubin: 0.6 mg/dL (ref 0.3–1.2)
Total Protein: 6.7 g/dL (ref 6.5–8.1)

## 2018-09-17 LAB — CBC
HCT: 42.4 % (ref 39.0–52.0)
Hemoglobin: 13.5 g/dL (ref 13.0–17.0)
MCH: 32.2 pg (ref 26.0–34.0)
MCHC: 31.8 g/dL (ref 30.0–36.0)
MCV: 101.2 fL — AB (ref 80.0–100.0)
Platelets: 139 10*3/uL — ABNORMAL LOW (ref 150–400)
RBC: 4.19 MIL/uL — AB (ref 4.22–5.81)
RDW: 13.2 % (ref 11.5–15.5)
WBC: 5.9 10*3/uL (ref 4.0–10.5)
nRBC: 0 % (ref 0.0–0.2)

## 2018-09-17 LAB — SURGICAL PCR SCREEN
MRSA, PCR: NEGATIVE
Staphylococcus aureus: POSITIVE — AB

## 2018-09-17 LAB — PROTIME-INR
INR: 0.98
Prothrombin Time: 12.9 seconds (ref 11.4–15.2)

## 2018-09-17 LAB — APTT: aPTT: 34 seconds (ref 24–36)

## 2018-09-17 NOTE — Progress Notes (Signed)
PCP - [name] MD Cardiologist - Dr. Wynonia Lawman  Chest x-ray - DOS EKG - 08/04/18 Stress Test - 2017  Blood Thinner Instructions: N/A Aspirin Instructions: will call Dr. Leonarda Salon office  Anesthesia review: yes, Hx Cad, previous note by Jeneen Rinks 11/12  Patient denies shortness of breath, fever, cough and chest pain at PAT appointment   Patient verbalized understanding of instructions that were given to them at the PAT appointment. Patient was also instructed that they will need to review over the PAT instructions again at home before surgery.

## 2018-09-17 NOTE — Telephone Encounter (Signed)
-----   Message from Melrose Nakayama, MD sent at 09/17/2018  3:03 PM EST ----- Regarding: RE: holding ASA No  [facility] ----- Message ----- From: Marylen Ponto, LPN Sent: 79/15/0569   1:28 PM EST To: Melrose Nakayama, MD Subject: holding ASA                                    He is scheduled to have port-a-cath placed 09/24/2018 Does he need to hold ASA 81 mg? Please advise SW

## 2018-09-17 NOTE — Anesthesia Preprocedure Evaluation (Addendum)
Anesthesia Evaluation  Patient identified by MRN, date of birth, ID band Patient awake    Reviewed: Allergy & Precautions, NPO status , Patient's Chart, lab work & pertinent test results  History of Anesthesia Complications Negative for: history of anesthetic complications  Airway Mallampati: II  TM Distance: >3 FB Neck ROM: Full    Dental  (+) Teeth Intact, Dental Advisory Given   Pulmonary former smoker,    Pulmonary exam normal breath sounds clear to auscultation       Cardiovascular hypertension, Pt. on home beta blockers and Pt. on medications + CAD, + Past MI (2000), + CABG and + Peripheral Vascular Disease  Normal cardiovascular exam Rhythm:Regular Rate:Normal  TTE 03/25/2016 (outside record, copy on pt chart): Occlusion: 1.  Moderate to severe concentric LVH with hypokinesis of the inferolateral wall.  Moderate decrease global wall motion.  Estimated EF 40 to 45%. 2.  Doppler evidence of grade 1 diastolic dysfunction. 3.  Mild left atrial enlargement. 4.  Mild to moderate mitral regurgitation. 5.  Trace tricuspid regurgitation. 6.  Aortic root is mildly dilated.  LBBB   Neuro/Psych negative neurological ROS     GI/Hepatic Neg liver ROS, GERD  Medicated and Controlled,  Endo/Other  negative endocrine ROS  Renal/GU negative Renal ROS     Musculoskeletal negative musculoskeletal ROS (+)   Abdominal   Peds  Hematology negative hematology ROS (+)   Anesthesia Other Findings Day of surgery medications reviewed with the patient.  Reproductive/Obstetrics                           Anesthesia Physical Anesthesia Plan  ASA: III  Anesthesia Plan: MAC   Post-op Pain Management:    Induction:   PONV Risk Score and Plan: 1 and Treatment may vary due to age or medical condition and Propofol infusion  Airway Management Planned: Natural Airway and Nasal Cannula  Additional Equipment:    Intra-op Plan:   Post-operative Plan:   Informed Consent: I have reviewed the patients History and Physical, chart, labs and discussed the procedure including the risks, benefits and alternatives for the proposed anesthesia with the patient or authorized representative who has indicated his/her understanding and acceptance.     Plan Discussed with: CRNA  Anesthesia Plan Comments: (See previous PAT note 09/03/2018 by Karoline Caldwell, PA-C )      Anesthesia Quick Evaluation

## 2018-09-17 NOTE — Telephone Encounter (Signed)
-----   Message from Melrose Nakayama, MD sent at 09/17/2018  3:03 PM EST ----- Regarding: RE: holding ASA No  [facility] ----- Message ----- From: Marylen Ponto, LPN Sent: 00/17/4944   1:28 PM EST To: Melrose Nakayama, MD Subject: holding ASA                                    He is scheduled to have port-a-cath placed 09/24/2018 Does he need to hold ASA 81 mg? Please advise SW

## 2018-09-22 ENCOUNTER — Other Ambulatory Visit: Payer: Medicare Other

## 2018-09-24 ENCOUNTER — Encounter (HOSPITAL_COMMUNITY): Payer: Self-pay

## 2018-09-24 ENCOUNTER — Ambulatory Visit (HOSPITAL_COMMUNITY)
Admission: RE | Admit: 2018-09-24 | Discharge: 2018-09-24 | Disposition: A | Discharge status: Home / Self Care | Payer: Medicare Other | Attending: Thoracic Surgery (Cardiothoracic Vascular Surgery) | Admitting: Thoracic Surgery (Cardiothoracic Vascular Surgery)

## 2018-09-24 ENCOUNTER — Ambulatory Visit (HOSPITAL_COMMUNITY): Payer: Medicare Other

## 2018-09-24 ENCOUNTER — Other Ambulatory Visit: Payer: Self-pay

## 2018-09-24 ENCOUNTER — Ambulatory Visit (HOSPITAL_COMMUNITY): Payer: Medicare Other | Admitting: Physician Assistant

## 2018-09-24 ENCOUNTER — Encounter (HOSPITAL_COMMUNITY)
Admission: RE | Disposition: A | Discharge status: Home / Self Care | Payer: Self-pay | Source: Home / Self Care | Attending: Thoracic Surgery (Cardiothoracic Vascular Surgery)

## 2018-09-24 ENCOUNTER — Ambulatory Visit (HOSPITAL_COMMUNITY): Payer: Medicare Other | Admitting: Anesthesiology

## 2018-09-24 DIAGNOSIS — K5900 Constipation, unspecified: Secondary | ICD-10-CM | POA: Insufficient documentation

## 2018-09-24 DIAGNOSIS — I714 Abdominal aortic aneurysm, without rupture: Secondary | ICD-10-CM | POA: Diagnosis not present

## 2018-09-24 DIAGNOSIS — I739 Peripheral vascular disease, unspecified: Secondary | ICD-10-CM | POA: Insufficient documentation

## 2018-09-24 DIAGNOSIS — K219 Gastro-esophageal reflux disease without esophagitis: Secondary | ICD-10-CM | POA: Insufficient documentation

## 2018-09-24 DIAGNOSIS — Z01818 Encounter for other preprocedural examination: Secondary | ICD-10-CM

## 2018-09-24 DIAGNOSIS — I252 Old myocardial infarction: Secondary | ICD-10-CM | POA: Diagnosis not present

## 2018-09-24 DIAGNOSIS — Z79899 Other long term (current) drug therapy: Secondary | ICD-10-CM | POA: Insufficient documentation

## 2018-09-24 DIAGNOSIS — Z7982 Long term (current) use of aspirin: Secondary | ICD-10-CM | POA: Insufficient documentation

## 2018-09-24 DIAGNOSIS — I10 Essential (primary) hypertension: Secondary | ICD-10-CM | POA: Insufficient documentation

## 2018-09-24 DIAGNOSIS — C3491 Malignant neoplasm of unspecified part of right bronchus or lung: Secondary | ICD-10-CM | POA: Diagnosis not present

## 2018-09-24 DIAGNOSIS — Z951 Presence of aortocoronary bypass graft: Secondary | ICD-10-CM | POA: Diagnosis not present

## 2018-09-24 DIAGNOSIS — E785 Hyperlipidemia, unspecified: Secondary | ICD-10-CM | POA: Diagnosis not present

## 2018-09-24 DIAGNOSIS — I2510 Atherosclerotic heart disease of native coronary artery without angina pectoris: Secondary | ICD-10-CM | POA: Diagnosis not present

## 2018-09-24 DIAGNOSIS — C3490 Malignant neoplasm of unspecified part of unspecified bronchus or lung: Secondary | ICD-10-CM

## 2018-09-24 DIAGNOSIS — C3431 Malignant neoplasm of lower lobe, right bronchus or lung: Secondary | ICD-10-CM | POA: Diagnosis not present

## 2018-09-24 DIAGNOSIS — Z87891 Personal history of nicotine dependence: Secondary | ICD-10-CM | POA: Diagnosis not present

## 2018-09-24 DIAGNOSIS — Z95828 Presence of other vascular implants and grafts: Secondary | ICD-10-CM

## 2018-09-24 DIAGNOSIS — Z8546 Personal history of malignant neoplasm of prostate: Secondary | ICD-10-CM | POA: Diagnosis not present

## 2018-09-24 DIAGNOSIS — M109 Gout, unspecified: Secondary | ICD-10-CM | POA: Insufficient documentation

## 2018-09-24 HISTORY — PX: PORTACATH PLACEMENT: SHX2246

## 2018-09-24 SURGERY — INSERTION, TUNNELED CENTRAL VENOUS DEVICE, WITH PORT
Anesthesia: Monitor Anesthesia Care | Site: Chest | Laterality: Left

## 2018-09-24 MED ORDER — PROMETHAZINE HCL 25 MG/ML IJ SOLN: 6.2500 mg | INTRAMUSCULAR | Status: DC | PRN

## 2018-09-24 MED ORDER — 0.9 % SODIUM CHLORIDE (POUR BTL) OPTIME
TOPICAL | Status: DC | PRN
  Administered 2018-09-24: 1000 mL

## 2018-09-24 MED ORDER — FENTANYL CITRATE (PF) 250 MCG/5ML IJ SOLN
INTRAMUSCULAR | Status: AC
  Filled 2018-09-24: qty 5

## 2018-09-24 MED ORDER — MIDAZOLAM HCL 5 MG/5ML IJ SOLN
INTRAMUSCULAR | Status: DC | PRN
  Administered 2018-09-24 (×2): 1 mg via INTRAVENOUS

## 2018-09-24 MED ORDER — LIDOCAINE HCL (PF) 1 % IJ SOLN
INTRAMUSCULAR | Status: DC | PRN
  Administered 2018-09-24: 14 mL

## 2018-09-24 MED ORDER — PHENYLEPHRINE HCL 10 MG/ML IJ SOLN
INTRAMUSCULAR | Status: DC | PRN
  Administered 2018-09-24: 40 ug via INTRAVENOUS
  Administered 2018-09-24: 80 ug via INTRAVENOUS
  Administered 2018-09-24: 40 ug via INTRAVENOUS

## 2018-09-24 MED ORDER — FENTANYL CITRATE (PF) 100 MCG/2ML IJ SOLN: 25.0000 ug | INTRAMUSCULAR | Status: DC | PRN

## 2018-09-24 MED ORDER — MIDAZOLAM HCL 2 MG/2ML IJ SOLN
INTRAMUSCULAR | Status: AC
  Filled 2018-09-24: qty 2

## 2018-09-24 MED ORDER — SODIUM CHLORIDE 0.9 % IV SOLN
INTRAVENOUS | Status: AC
  Filled 2018-09-24: qty 1.2

## 2018-09-24 MED ORDER — SODIUM CHLORIDE 0.9 % IV SOLN
INTRAVENOUS | Status: DC | PRN
  Administered 2018-09-24: 500 mL

## 2018-09-24 MED ORDER — FENTANYL CITRATE (PF) 100 MCG/2ML IJ SOLN
INTRAMUSCULAR | Status: DC | PRN
  Administered 2018-09-24: 50 ug via INTRAVENOUS

## 2018-09-24 MED ORDER — IOPAMIDOL (ISOVUE-300) INJECTION 61%
INTRAVENOUS | Status: AC
  Filled 2018-09-24: qty 50

## 2018-09-24 MED ORDER — PROPOFOL 500 MG/50ML IV EMUL
INTRAVENOUS | Status: DC | PRN
  Administered 2018-09-24: 25 ug/kg/min via INTRAVENOUS

## 2018-09-24 MED ORDER — LIDOCAINE HCL (PF) 1 % IJ SOLN
INTRAMUSCULAR | Status: AC
  Filled 2018-09-24: qty 30

## 2018-09-24 MED ORDER — TRAMADOL HCL 50 MG PO TABS: 50.0000 mg | ORAL_TABLET | Freq: Four times a day (QID) | ORAL | Status: DC | PRN

## 2018-09-24 MED ORDER — HEPARIN SODIUM (PORCINE) 1000 UNIT/ML IJ SOLN
INTRAMUSCULAR | Status: AC
  Filled 2018-09-24: qty 1

## 2018-09-24 MED ORDER — LACTATED RINGERS IV SOLN
INTRAVENOUS | Status: DC
  Administered 2018-09-24 (×2): via INTRAVENOUS

## 2018-09-24 MED ORDER — HEPARIN SODIUM (PORCINE) 1000 UNIT/ML IJ SOLN
INTRAMUSCULAR | Status: DC | PRN
  Administered 2018-09-24: 2500 [IU]

## 2018-09-24 MED ORDER — CEFAZOLIN SODIUM-DEXTROSE 2-4 GM/100ML-% IV SOLN
INTRAVENOUS | Status: AC
  Filled 2018-09-24: qty 100

## 2018-09-24 MED ORDER — CEFAZOLIN SODIUM-DEXTROSE 2-4 GM/100ML-% IV SOLN
2.0000 g | INTRAVENOUS | Status: AC
  Administered 2018-09-24: 2 g via INTRAVENOUS

## 2018-09-24 MED ORDER — TRAMADOL HCL 50 MG PO TABS: 50.0000 mg | ORAL_TABLET | Freq: Four times a day (QID) | ORAL | 0 refills | Status: DC | PRN

## 2018-09-24 MED ORDER — ONDANSETRON HCL 4 MG/2ML IJ SOLN
INTRAMUSCULAR | Status: DC | PRN
  Administered 2018-09-24: 4 mg via INTRAVENOUS

## 2018-09-24 SURGICAL SUPPLY — 36 items
ADH SKN CLS APL DERMABOND .7 (GAUZE/BANDAGES/DRESSINGS) ×1
BAG DECANTER FOR FLEXI CONT (MISCELLANEOUS) ×3 IMPLANT
CANISTER SUCT 3000ML PPV (MISCELLANEOUS) ×3 IMPLANT
COVER SURGICAL LIGHT HANDLE (MISCELLANEOUS) ×3 IMPLANT
COVER WAND RF STERILE (DRAPES) ×3 IMPLANT
DERMABOND ADVANCED (GAUZE/BANDAGES/DRESSINGS) ×2
DERMABOND ADVANCED .7 DNX12 (GAUZE/BANDAGES/DRESSINGS) ×1 IMPLANT
DRAPE C-ARM 42X72 X-RAY (DRAPES) ×3 IMPLANT
DRAPE CHEST BREAST 15X10 FENES (DRAPES) ×3 IMPLANT
ELECT REM PT RETURN 9FT ADLT (ELECTROSURGICAL) ×3
ELECTRODE REM PT RTRN 9FT ADLT (ELECTROSURGICAL) ×1 IMPLANT
GLOVE SURG SIGNA 7.5 PF LTX (GLOVE) ×3 IMPLANT
GOWN STRL REUS W/ TWL LRG LVL3 (GOWN DISPOSABLE) ×1 IMPLANT
GOWN STRL REUS W/ TWL XL LVL3 (GOWN DISPOSABLE) ×1 IMPLANT
GOWN STRL REUS W/TWL LRG LVL3 (GOWN DISPOSABLE) ×3
GOWN STRL REUS W/TWL XL LVL3 (GOWN DISPOSABLE) ×3
GUIDEWIRE UNCOATED ST S 7038 (WIRE) IMPLANT
INTRODUCER 13FR (INTRODUCER) IMPLANT
INTRODUCER COOK 11FR (CATHETERS) IMPLANT
KIT BASIN OR (CUSTOM PROCEDURE TRAY) ×3 IMPLANT
KIT PORT POWER 9.6FR MRI PREA (Stent) ×2 IMPLANT
KIT TURNOVER KIT B (KITS) ×3 IMPLANT
NS IRRIG 1000ML POUR BTL (IV SOLUTION) ×3 IMPLANT
PACK GENERAL/GYN (CUSTOM PROCEDURE TRAY) ×3 IMPLANT
PAD ARMBOARD 7.5X6 YLW CONV (MISCELLANEOUS) ×6 IMPLANT
SET SHEATH INTRODUCER 10FR (MISCELLANEOUS) IMPLANT
SUT MNCRL AB 4-0 PS2 18 (SUTURE) IMPLANT
SUT VIC AB 3-0 SH 27 (SUTURE) ×6
SUT VIC AB 3-0 SH 27X BRD (SUTURE) ×2 IMPLANT
SUT VICRYL 4-0 PS2 18IN ABS (SUTURE) ×3 IMPLANT
SYR 20CC LL (SYRINGE) ×3 IMPLANT
SYR 5ML LL (SYRINGE) ×3 IMPLANT
TOWEL GREEN STERILE (TOWEL DISPOSABLE) ×3 IMPLANT
TOWEL GREEN STERILE FF (TOWEL DISPOSABLE) ×3 IMPLANT
WATER STERILE IRR 1000ML POUR (IV SOLUTION) ×3 IMPLANT
WIRE .035 3MM-J 145CM (WIRE) IMPLANT

## 2018-09-24 NOTE — H&P (Signed)
Tony [name] is an 78 y.o. male.   Chief Complaint: needs IV access  HPI: 78 yo man with stage IV lung cancer being treated with chemo and radiation. Has completed radiation and needs reliable IV access for chemotherapy. He has no complaints this AM.  Past Medical History:  Diagnosis Date  . Anemia   . CAD (coronary artery disease)   . Constipation   . Gout   . Heart disease   . History of blood transfusion   . Hyperlipidemia   . Hypertension   . Mass of lower lobe of right lung   . Myocardial infarction ([place])    MI in 2000  . Peripheral vascular disease ([facility])    Abdominal Aortic Aneurysm  . Prostate cancer [facility]) Nov. 2012  . Ulcer     Past Surgical History:  Procedure Laterality Date  . ANAL FISSURECTOMY    . COLONOSCOPY    . CORONARY ARTERY BYPASS GRAFT     2000  . EYE SURGERY Left Nov. 2013   Cataract  . EYE SURGERY Right Feb. 2014   Cataract  . HERNIA REPAIR    . PR VEIN BYPASS GRAFT,AORTO-FEM-POP  2000  . PROSTATE SURGERY  Nov. 2012  . ROBOT ASSISTED LAPAROSCOPIC RADICAL PROSTATECTOMY  08/08/2011   Procedure: ROBOTIC ASSISTED LAPAROSCOPIC RADICAL PROSTATECTOMY LEVEL 2;  Surgeon: Dutch Gray, MD;  Location: [facility] ORS;  Service: Urology;  Laterality: Bilateral;  with Bilateral Pelvic Lymphadenectomy   . SHOULDER SURGERY  1980'S  . VIDEO BRONCHOSCOPY WITH ENDOBRONCHIAL ULTRASOUND N/A 08/06/2018   Procedure: VIDEO BRONCHOSCOPY WITH ENDOBRONCHIAL ULTRASOUND;  Surgeon: Melrose Nakayama, MD;  Location: [facility] OR;  Service: Thoracic;  Laterality: N/A;  . WISDOM TOOTH EXTRACTION      Family History  Problem Relation Age of Onset  . Diabetes Mother   . Heart disease Mother        CHF  . Heart disease Father   . Heart failure Father        Amputation- Leg  . Deep vein thrombosis Father   . Heart disease Brother        Heart Disease before age 31  . Heart failure Brother   . Hyperlipidemia Brother   . Hypertension Brother   . Heart disease Sister   . Diabetes  Sister   . Heart failure Sister    Social History:  reports that he quit smoking about 20 years ago. His smoking use included cigarettes. He has a 64.50 pack-year smoking history. He has never used smokeless tobacco. He reports current alcohol use of about 7.0 standard drinks of alcohol per week. He reports that he does not use drugs.  Allergies: No Known Allergies  Medications Prior to Admission  Medication Sig Dispense Refill  . acetaminophen (TYLENOL) 650 MG CR tablet Take 650 mg by mouth every 8 (eight) hours as needed for pain.     Marland Kitchen aspirin 81 MG tablet Take 81 mg by mouth every evening.     . bisacodyl (DULCOLAX) 10 MG suppository Place 1 suppository (10 mg total) rectally as needed for moderate constipation. 12 suppository 0  . colchicine (COLCRYS) 0.6 MG tablet Take 0.6 mg by mouth daily as needed (for gout flare up).     . folic acid (FOLVITE) 527 MCG tablet Take 800 mcg by mouth daily.     . metoprolol tartrate (LOPRESSOR) 50 MG tablet Take 50 mg by mouth 2 (two) times daily.    . Multiple Vitamins-Minerals (MULTIVITAMIN WITH MINERALS) tablet  Take 1 tablet by mouth daily.    . niacin 500 MG tablet Take 500 mg by mouth 2 (two) times daily.    . Omega-3 Fatty Acids (FISH OIL) 1200 MG CAPS Take 1,200 mg by mouth daily.     Marland Kitchen omeprazole (PRILOSEC OTC) 20 MG tablet Take 10 mg by mouth 2 (two) times a week.    . polyethylene glycol (MIRALAX / GLYCOLAX) packet Take 17 g by mouth 4 (four) times a week.     . pravastatin (PRAVACHOL) 10 MG tablet Take 10 mg by mouth every evening.     . Psyllium (METAMUCIL FIBER PO) Take 1 Dose by mouth 3 (three) times a week.     . ramipril (ALTACE) 10 MG capsule Take 10 mg by mouth every morning.     . triamterene-hydrochlorothiazide (MAXZIDE-25) 37.5-25 MG tablet Take 0.5 tablets by mouth every morning.     . lidocaine-prilocaine (EMLA) cream Apply 1 application topically as needed. 30 g 1  . LORazepam (ATIVAN) 1 MG tablet Take 1-2 tablets by mouth 15  min prior to brain radiation planning, prior to brain radiation, and prior to future MRI, PRN anxiety. 6 tablet 0  . prochlorperazine (COMPAZINE) 10 MG tablet Take 1 tablet (10 mg total) by mouth every 6 (six) hours as needed for nausea or vomiting. 30 tablet 1    No results found for this or any previous visit (from the past 48 hour(s)). Dg Chest 2 View  Result Date: 09/24/2018 CLINICAL DATA:  78 year old male with chest malignancy, preop chest x-ray EXAM: CHEST - 2 VIEW COMPARISON:  08/06/2018, 08/04/2018, PET-CT 07/27/2018 FINDINGS: Cardiomediastinal silhouette unchanged in size and contour. Surgical changes of median sternotomy and CABG. No pneumothorax. No pleural effusion. No new confluent airspace disease. Lateral view demonstrates opacity at the posterior lung base overlying the anterior aspect of the thoracic spine. Osteopenia IMPRESSION: Chronic lung changes without evidence of acute cardiopulmonary disease. Opacity on the lateral view corresponds to known malignancy of the right lower lobe. Surgical changes of prior median sternotomy and CABG Electronically Signed   By: Corrie Mckusick D.O.   On: 09/24/2018 10:05    ROS  Blood pressure (!) 154/73, pulse 65, temperature (!) 97.5 F (36.4 C), temperature source Oral, resp. rate 18, height 5\' 9"  (1.753 m), weight 81.3 kg, SpO2 100 %. Physical Exam  Vitals reviewed. Constitutional: He is oriented to person, place, and time. He appears well-developed and well-nourished. No distress.  HENT:  Head: Normocephalic and atraumatic.  Mouth/Throat: No oropharyngeal exudate.  Eyes: Conjunctivae are normal. No scleral icterus.  Neck: Neck supple.  Cardiovascular: Normal rate, regular rhythm and normal heart sounds.  No murmur heard. Respiratory: Effort normal. No respiratory distress. He has no wheezes. He has no rales.  Diminished BS right base  GI: Soft. He exhibits no distension.  Musculoskeletal:        General: No edema.  Lymphadenopathy:     He has no cervical adenopathy.  Neurological: He is alert and oriented to person, place, and time. No cranial nerve deficit. He exhibits normal muscle tone.  Skin: Skin is warm and dry.     Assessment/Plan 77 yo man presents for portacath placement for IV access for chemotherapy.   I discussed the indications, risks, benefits and alternatives with him. He understands the risks include but are not limited to bleeding, infection, thrombosis, infection, as well as the possibility of other unforeseeable complications. He accepts the risks and agrees to proceed.  Revonda Standard  Roxan Hockey, MD 09/24/2018, 10:14 AM

## 2018-09-24 NOTE — Brief Op Note (Signed)
09/24/2018  11:47 AM  PATIENT:  Tony [name]  78 y.o. male  PRE-OPERATIVE DIAGNOSIS:  LUNG CANCER  POST-OPERATIVE DIAGNOSIS:  LUNG CANCER  PROCEDURE:  Procedure(s): INSERTION PORT-A-CATH (Left)  SURGEON:  Surgeon(s) and Role:    * Melrose Nakayama, MD - Primary  PHYSICIAN ASSISTANT:   ASSISTANTS: none   ANESTHESIA:   local and IV sedation  EBL:  Minimal   BLOOD ADMINISTERED:none  DRAINS: none   LOCAL MEDICATIONS USED:  LIDOCAINE  and Amount: 15 ml  SPECIMEN:  No Specimen  DISPOSITION OF SPECIMEN:  N/A  COUNTS:  YES  TOURNIQUET:  * No tourniquets in log *  DICTATION: .Other Dictation: Dictation Number -  PLAN OF CARE: Discharge to home after PACU  PATIENT DISPOSITION:  PACU - hemodynamically stable.   Delay start of Pharmacological VTE agent (>24hrs) due to surgical blood loss or risk of bleeding: not applicable

## 2018-09-24 NOTE — Anesthesia Postprocedure Evaluation (Signed)
Anesthesia Post Note  Patient: Tony [name]  Procedure(s) Performed: INSERTION PORT-A-CATH (Left Chest)     Patient location during evaluation: PACU Anesthesia Type: MAC Level of consciousness: awake and alert Pain management: pain level controlled Vital Signs Assessment: post-procedure vital signs reviewed and stable Respiratory status: spontaneous breathing, nonlabored ventilation and respiratory function stable Cardiovascular status: blood pressure returned to baseline and stable Postop Assessment: no apparent nausea or vomiting Anesthetic complications: no    Last Vitals:  Vitals:   09/24/18 1144 09/24/18 1155  BP:  (!) 99/54  Pulse: (!) 59 (!) 57  Resp: 14 17  Temp:    SpO2: 98% 97%    Last Pain:  Vitals:   09/24/18 1157  TempSrc:   PainSc: 0-No pain                 Brennan Bailey

## 2018-09-24 NOTE — Op Note (Signed)
NAME: Tony [name], Tony [name] MEDICAL RECORD [id number] ACCOUNT [id number] DATE OF BIRTH:[birth year] FACILITY: [facility] LOCATION: [facility] PHYSICIAN:Beola Vasallo Chaya Jan, MD  OPERATIVE REPORT  DATE OF PROCEDURE:  09/24/2018  PREOPERATIVE DIAGNOSIS:  Stage IV lung cancer, needs IV access for chemotherapy.  POSTOPERATIVE DIAGNOSIS:  Stage IV lung cancer needs IV access for chemotherapy.  PROCEDURE:  Insertion of Port-A-Cath, left subclavian vein.  SURGEON:  Modesto Charon, MD  ASSISTANT:  None.  ANESTHESIA:  Local with intravenous sedation.  FINDINGS:  Vein accessed on first pass.  Good return from catheter, and catheter flushed easily.  CLINICAL NOTE:  The patient is a 78 year old gentleman recently diagnosed with stage IV lung cancer, being treated with chemotherapy and radiation.  He needs reliable intravenous access for chemotherapy.  He was advised to have Port-A-Cath placement.  The  indications, risks, benefits, and alternatives were discussed in detail with the patient.  He understood and accepted the risks and agreed to proceed.  OPERATIVE NOTE:  The patient was brought to the operating room on 09/24/2018.  He was given intravenous sedation and monitored by anesthesia.  Intravenous antibiotics were administered.  The chest and neck was prepped and draped in the usual sterile  fashion.  A timeout was performed.  A 9.6 [place] Port-A-Cath with pre-attached catheter was pre-flushed with heparinized saline.  The area for insertion of the catheter and for the pocket was anesthetized with 1% lidocaine.  Fifteen mL was used in all.   After ensuring adequate local anesthetic effect, the patient was placed in Trendelenburg position.  The left subclavian vein was accessed using the Seldinger technique.  A wire passed easily.  Fluoroscopy showed the position of the wire in the right  atrium.  There was distortion due to the large right hilar mass.  The Port-A-Cath was cut to 20 cm.  A pocket  was created.  Meticulous hemostasis was obtained.  The Peel-Away sheath introducer then was advanced over the wire.  The inner cannula was  removed.  The catheter was advanced through the Peel-Away sheath introducer, which was removed.  There was good return from the catheter, and the catheter flushed easily.  Fluoroscopy confirmed position in the SVC.  The catheter was secured to the  pectoralis fascia at 3 sites with 3-0 Vicryl sutures.  The catheter was flushed with 2.2 mL of concentrated heparin.  The incision was closed with a 3-0 Vicryl subcutaneous suture and a 4-0 Vicryl subcuticular suture.  All sponge, needle and instrument  counts were correct at the end of the procedure.  The patient was taken from the operating room to the [facility] in good condition.  LN/NUANCE  D:09/24/2018 T:09/24/2018 JOB:[id number]

## 2018-09-24 NOTE — Anesthesia Procedure Notes (Signed)
Procedure Name: MAC Date/Time: 09/24/2018 11:17 AM Performed by: Lavell Luster, CRNA Pre-anesthesia Checklist: Patient identified, Emergency Drugs available, Suction available, Patient being monitored and Timeout performed Patient Re-evaluated:Patient Re-evaluated prior to induction Oxygen Delivery Method: Nasal cannula Preoxygenation: Pre-oxygenation with 100% oxygen Induction Type: IV induction Placement Confirmation: breath sounds checked- equal and bilateral and positive ETCO2 Dental Injury: Teeth and Oropharynx as per pre-operative assessment

## 2018-09-24 NOTE — Discharge Instructions (Addendum)
Do not drive or engage in heavy physical activity for 24 hours  You may resume normal activities tomorrow  You may shower tomorrow  There is a medical adhesive over the incision. It will begin to peel off in 10-14 days  You have a prescription for tramadol, a narcotic pain reliever. You may use as directed. You may use acetaminophen (Tylenol) in addition to, or instead of the tramadol.  Follow up as scheduled with Dr. Julien Nordmann  Call [phone] if you develop excessive pain, shortness of breath, drainage from the incision or excessive redness or swelling   Post Anesthesia Home Care Instructions  Activity: Get plenty of rest for the remainder of the day. A responsible individual must stay with you for 24 hours following the procedure.  For the next 24 hours, DO NOT: -Drive a car -Paediatric nurse -Drink alcoholic beverages -Take any medication unless instructed by your physician -Make any legal decisions or sign important papers.  Meals: Start with liquid foods such as gelatin or soup. Progress to regular foods as tolerated. Avoid greasy, spicy, heavy foods. If nausea and/or vomiting occur, drink only clear liquids until the nausea and/or vomiting subsides. Call your physician if vomiting continues.  Special Instructions/Symptoms: Your throat may feel dry or sore from the anesthesia or the breathing tube placed in your throat during surgery. If this causes discomfort, gargle with warm salt water. The discomfort should disappear within 24 hours.  If you had a scopolamine patch placed behind your ear for the management of post- operative nausea and/or vomiting:  1. The medication in the patch is effective for 72 hours, after which it should be removed.  Wrap patch in a tissue and discard in the trash. Wash hands thoroughly with soap and water. 2. You may remove the patch earlier than 72 hours if you experience unpleasant side effects which may include dry mouth, dizziness or visual  disturbances. 3. Avoid touching the patch. Wash your hands with soap and water after contact with the patch.

## 2018-09-24 NOTE — Transfer of Care (Signed)
Immediate Anesthesia Transfer of Care Note  Patient: Tony [name]  Procedure(s) Performed: INSERTION PORT-A-CATH (Left Chest)  Patient Location: PACU  Anesthesia Type:MAC  Level of Consciousness: awake, alert  and oriented  Airway & Oxygen Therapy: Patient connected to nasal cannula oxygen  Post-op Assessment: Post -op Vital signs reviewed and stable  Post vital signs: stable  Last Vitals:  Vitals Value Taken Time  BP 111/60 09/24/2018 11:43 AM  Temp    Pulse 59 09/24/2018 11:44 AM  Resp 14 09/24/2018 11:44 AM  SpO2 98 % 09/24/2018 11:44 AM  Vitals shown include unvalidated device data.  Last Pain:  Vitals:   09/24/18 1022  TempSrc:   PainSc: 0-No pain      Patients Stated Pain Goal: 3 (10/62/69 4854)  Complications: No apparent anesthesia complications

## 2018-09-25 ENCOUNTER — Encounter (HOSPITAL_COMMUNITY): Payer: Self-pay | Admitting: Thoracic Surgery (Cardiothoracic Vascular Surgery)

## 2018-09-29 ENCOUNTER — Encounter: Payer: Self-pay | Admitting: Radiation Oncology

## 2018-09-29 ENCOUNTER — Inpatient Hospital Stay: Payer: Medicare Other | Attending: Oncology | Admitting: Internal Medicine

## 2018-09-29 ENCOUNTER — Inpatient Hospital Stay: Payer: Medicare Other

## 2018-09-29 ENCOUNTER — Encounter: Payer: Self-pay | Admitting: Internal Medicine

## 2018-09-29 ENCOUNTER — Telehealth: Payer: Self-pay | Admitting: Internal Medicine

## 2018-09-29 ENCOUNTER — Other Ambulatory Visit: Payer: Medicare Other

## 2018-09-29 VITALS — BP 156/81 | HR 60 | Temp 98.6°F | Resp 18 | Ht 69.0 in | Wt 177.6 lb

## 2018-09-29 DIAGNOSIS — W010XXA Fall on same level from slipping, tripping and stumbling without subsequent striking against object, initial encounter: Secondary | ICD-10-CM | POA: Diagnosis not present

## 2018-09-29 DIAGNOSIS — Z79899 Other long term (current) drug therapy: Secondary | ICD-10-CM | POA: Insufficient documentation

## 2018-09-29 DIAGNOSIS — S0990XA Unspecified injury of head, initial encounter: Secondary | ICD-10-CM | POA: Insufficient documentation

## 2018-09-29 DIAGNOSIS — C7972 Secondary malignant neoplasm of left adrenal gland: Secondary | ICD-10-CM | POA: Insufficient documentation

## 2018-09-29 DIAGNOSIS — C7931 Secondary malignant neoplasm of brain: Secondary | ICD-10-CM | POA: Diagnosis not present

## 2018-09-29 DIAGNOSIS — Z95828 Presence of other vascular implants and grafts: Secondary | ICD-10-CM

## 2018-09-29 DIAGNOSIS — Z5111 Encounter for antineoplastic chemotherapy: Secondary | ICD-10-CM | POA: Diagnosis not present

## 2018-09-29 DIAGNOSIS — Z87891 Personal history of nicotine dependence: Secondary | ICD-10-CM | POA: Diagnosis not present

## 2018-09-29 DIAGNOSIS — Z7189 Other specified counseling: Secondary | ICD-10-CM

## 2018-09-29 DIAGNOSIS — I2510 Atherosclerotic heart disease of native coronary artery without angina pectoris: Secondary | ICD-10-CM | POA: Diagnosis not present

## 2018-09-29 DIAGNOSIS — C3491 Malignant neoplasm of unspecified part of right bronchus or lung: Secondary | ICD-10-CM

## 2018-09-29 DIAGNOSIS — I10 Essential (primary) hypertension: Secondary | ICD-10-CM | POA: Diagnosis not present

## 2018-09-29 DIAGNOSIS — C7971 Secondary malignant neoplasm of right adrenal gland: Secondary | ICD-10-CM | POA: Diagnosis not present

## 2018-09-29 DIAGNOSIS — C3431 Malignant neoplasm of lower lobe, right bronchus or lung: Secondary | ICD-10-CM

## 2018-09-29 DIAGNOSIS — Z5112 Encounter for antineoplastic immunotherapy: Secondary | ICD-10-CM

## 2018-09-29 DIAGNOSIS — C7951 Secondary malignant neoplasm of bone: Secondary | ICD-10-CM

## 2018-09-29 DIAGNOSIS — D696 Thrombocytopenia, unspecified: Secondary | ICD-10-CM | POA: Diagnosis not present

## 2018-09-29 DIAGNOSIS — M8580 Other specified disorders of bone density and structure, unspecified site: Secondary | ICD-10-CM | POA: Insufficient documentation

## 2018-09-29 LAB — CMP (CANCER CENTER ONLY)
ALT: 12 U/L (ref 0–44)
AST: 15 U/L (ref 15–41)
Albumin: 3.6 g/dL (ref 3.5–5.0)
Alkaline Phosphatase: 108 U/L (ref 38–126)
Anion gap: 11 (ref 5–15)
BUN: 13 mg/dL (ref 8–23)
CHLORIDE: 105 mmol/L (ref 98–111)
CO2: 24 mmol/L (ref 22–32)
CREATININE: 0.95 mg/dL (ref 0.61–1.24)
Calcium: 9.7 mg/dL (ref 8.9–10.3)
GFR, Est AFR Am: >60 mL/min (ref >60)
GFR, Estimated: >60 mL/min (ref >60)
Glucose, Bld: 104 mg/dL — ABNORMAL HIGH (ref 70–99)
Potassium: 4.2 mmol/L (ref 3.5–5.1)
Sodium: 140 mmol/L (ref 135–145)
Total Bilirubin: 0.5 mg/dL (ref 0.3–1.2)
Total Protein: 7.2 g/dL (ref 6.5–8.1)

## 2018-09-29 LAB — CBC WITH DIFFERENTIAL (CANCER CENTER ONLY)
Abs Immature Granulocytes: 0.02 10*3/uL (ref 0.00–0.07)
Basophils Absolute: 0 10*3/uL (ref 0.0–0.1)
Basophils Relative: 0 %
Eosinophils Absolute: 0.1 10*3/uL (ref 0.0–0.5)
Eosinophils Relative: 2 %
HCT: 40.6 % (ref 39.0–52.0)
Hemoglobin: 13.7 g/dL (ref 13.0–17.0)
Immature Granulocytes: 0 %
LYMPHS PCT: 26 %
Lymphs Abs: 1.4 10*3/uL (ref 0.7–4.0)
MCH: 33 pg (ref 26.0–34.0)
MCHC: 33.7 g/dL (ref 30.0–36.0)
MCV: 97.8 fL (ref 80.0–100.0)
MONOS PCT: 11 %
Monocytes Absolute: 0.6 10*3/uL (ref 0.1–1.0)
Neutro Abs: 3.1 10*3/uL (ref 1.7–7.7)
Neutrophils Relative %: 61 %
Platelet Count: 148 10*3/uL — ABNORMAL LOW (ref 150–400)
RBC: 4.15 MIL/uL — ABNORMAL LOW (ref 4.22–5.81)
RDW: 12.8 % (ref 11.5–15.5)
WBC Count: 5.2 10*3/uL (ref 4.0–10.5)
nRBC: 0 % (ref 0.0–0.2)

## 2018-09-29 LAB — TSH: TSH: 0.671 u[IU]/mL (ref 0.320–4.118)

## 2018-09-29 MED ORDER — HEPARIN SOD (PORK) LOCK FLUSH 100 UNIT/ML IV SOLN
500.0000 [IU] | Freq: Once | INTRAVENOUS | Status: AC
  Administered 2018-09-29: 500 [IU] via INTRAVENOUS
  Filled 2018-09-29: qty 5

## 2018-09-29 MED ORDER — SODIUM CHLORIDE 0.9% FLUSH
10.0000 mL | INTRAVENOUS | Status: DC | PRN
  Administered 2018-09-29: 10 mL via INTRAVENOUS
  Filled 2018-09-29: qty 10

## 2018-09-29 NOTE — Progress Notes (Signed)
Tony [name] presents for follow up of radiation completed 09/01/18 to his cerebella, 5 sites in 1 fraction. He reports feeling like he is in a fog at times. He reports headaches at times to the back of his head at night related to pillow firmness. He is eating and drinking well per his reports. He does tells me that his appetite is decreased.   BP 130/62   Pulse (!) 57   Temp 98.3 F (36.8 C) (Oral)   Wt 177 lb (80.3 kg)   SpO2 100% Comment: room air  BMI 26.14 kg/m    Wt Readings from Last 3 Encounters:  10/05/18 177 lb (80.3 kg)  10/05/18 177 lb (80.3 kg)  09/29/18 177 lb 9.6 oz (80.6 kg)

## 2018-09-29 NOTE — Progress Notes (Signed)
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  Molecular Studies by [facility]: Showed no actionable mutations.  PRIOR THERAPY:1) palliative radiation to the right neck and shoulder.  He is scheduled to have [place] to the brain lesions on 09/01/2018.  CURRENT THERAPY: 1) palliative systemic chemotherapy with carboplatin for an AUC of 5, Alimta 500 mg/m, and Keytruda 200 mg IV every 3 weeks.  First dose September 30, 2018.  INTERVAL HISTORY: Tony [name] 78 y.o. male returns to the clinic today for follow-up visit accompanied by his wife.  The patient is feeling fine today with no concerning complaints except for mild right shoulder pain.  He underwent sigmoidoscopy under the care of Dr. Cristina Gong recently and that showed no concerning finding for malignancy in his colon.  The patient denied having any chest pain, shortness of breath, cough or hemoptysis.  He denied having any fever or chills.  He has no nausea, vomiting, diarrhea or constipation.  He delayed his first cycle of the chemotherapy by more than 2 weeks until he goes through the holiday season and also for the sigmoidoscopy.  He has no headache or visual changes.  He is here today for evaluation before starting the first cycle of his treatment tomorrow.  MEDICAL HISTORY: Past Medical History:  Diagnosis Date  . Anemia   . CAD (coronary artery disease)   . Constipation   . Gout   . Heart disease   . History of blood transfusion   . History of radiation therapy 09/01/2018   brain, cerebella (5 sites)/ 20 Gy in 1 fraction.  . Hyperlipidemia     . Hypertension   . Mass of lower lobe of right lung   . Myocardial infarction ([place])    MI in 2000  . Peripheral vascular disease ([facility])    Abdominal Aortic Aneurysm  . Prostate cancer [facility]) Nov. 2012  . Ulcer     ALLERGIES:  has No Known Allergies.  MEDICATIONS:  Current Outpatient Medications  Medication Sig Dispense Refill  . acetaminophen (TYLENOL) 650 MG CR tablet Take 650 mg by mouth every 8 (eight) hours as needed for pain.     Marland Kitchen aspirin 81 MG tablet Take 81 mg by mouth every evening.     . bisacodyl (DULCOLAX) 10 MG suppository Place 1 suppository (10 mg total) rectally as needed for moderate constipation. 12 suppository 0  . colchicine (COLCRYS) 0.6 MG tablet Take 0.6 mg by mouth daily as needed (for gout flare up).     . folic acid (FOLVITE) 151 MCG tablet Take 800 mcg by mouth daily.     Marland Kitchen lidocaine-prilocaine (EMLA) cream Apply 1 application topically as needed. 30 g 1  . LORazepam (ATIVAN) 1 MG tablet Take 1-2 tablets by mouth 15 min prior to brain radiation planning, prior to brain radiation, and prior to future MRI, PRN anxiety. 6 tablet 0  . metoprolol tartrate (LOPRESSOR) 50 MG tablet Take 50 mg by mouth 2 (two) times daily.    . Multiple Vitamins-Minerals (MULTIVITAMIN WITH MINERALS) tablet Take 1 tablet by mouth daily.    . niacin 500 MG tablet Take 500 mg by  mouth 2 (two) times daily.    . Omega-3 Fatty Acids (FISH OIL) 1200 MG CAPS Take 1,200 mg by mouth daily.     Marland Kitchen omeprazole (PRILOSEC OTC) 20 MG tablet Take 10 mg by mouth 2 (two) times a week.    . polyethylene glycol (MIRALAX / GLYCOLAX) packet Take 17 g by mouth 4 (four) times a week.     . pravastatin (PRAVACHOL) 10 MG tablet Take 10 mg by mouth every evening.     . prochlorperazine (COMPAZINE) 10 MG tablet Take 1 tablet (10 mg total) by mouth every 6 (six) hours as needed for nausea or vomiting. 30 tablet 1  . Psyllium (METAMUCIL FIBER PO) Take 1 Dose by mouth 3 (three) times a week.     . ramipril (ALTACE)  10 MG capsule Take 10 mg by mouth every morning.     . traMADol (ULTRAM) 50 MG tablet Take 1-2 tablets (50-100 mg total) by mouth every 6 (six) hours as needed for moderate pain. 20 tablet 0  . triamterene-hydrochlorothiazide (MAXZIDE-25) 37.5-25 MG tablet Take 0.5 tablets by mouth every morning.      No current facility-administered medications for this visit.     SURGICAL HISTORY:  Past Surgical History:  Procedure Laterality Date  . ANAL FISSURECTOMY    . COLONOSCOPY    . CORONARY ARTERY BYPASS GRAFT     2000  . EYE SURGERY Left Nov. 2013   Cataract  . EYE SURGERY Right Feb. 2014   Cataract  . HERNIA REPAIR    . PORTACATH PLACEMENT Left 09/24/2018   Procedure: INSERTION PORT-A-CATH;  Surgeon: Melrose Nakayama, MD;  Location: [facility];  Service: Thoracic;  Laterality: Left;  . PR VEIN BYPASS GRAFT,AORTO-FEM-POP  2000  . PROSTATE SURGERY  Nov. 2012  . ROBOT ASSISTED LAPAROSCOPIC RADICAL PROSTATECTOMY  08/08/2011   Procedure: ROBOTIC ASSISTED LAPAROSCOPIC RADICAL PROSTATECTOMY LEVEL 2;  Surgeon: Dutch Gray, MD;  Location: [facility];  Service: Urology;  Laterality: Bilateral;  with Bilateral Pelvic Lymphadenectomy   . SHOULDER SURGERY  1980'S  . VIDEO BRONCHOSCOPY WITH ENDOBRONCHIAL ULTRASOUND N/A 08/06/2018   Procedure: VIDEO BRONCHOSCOPY WITH ENDOBRONCHIAL ULTRASOUND;  Surgeon: Melrose Nakayama, MD;  Location: [facility];  Service: Thoracic;  Laterality: N/A;  . WISDOM TOOTH EXTRACTION      REVIEW OF SYSTEMS:  Constitutional: positive for fatigue Eyes: negative Ears, nose, mouth, throat, and face: negative Respiratory: positive for dyspnea on exertion Cardiovascular: negative Gastrointestinal: negative Genitourinary:negative Integument/breast: negative Hematologic/lymphatic: negative Musculoskeletal:positive for bone pain Neurological: negative Behavioral/Psych: negative Endocrine: negative Allergic/Immunologic: negative   PHYSICAL EXAMINATION: General appearance: alert,  cooperative, fatigued and no distress Head: Normocephalic, without obvious abnormality, atraumatic Neck: no adenopathy, no JVD, supple, symmetrical, trachea midline and thyroid not enlarged, symmetric, no tenderness/mass/nodules Lymph nodes: Cervical, supraclavicular, and axillary nodes normal. Resp: clear to auscultation bilaterally Back: symmetric, no curvature. ROM normal. No CVA tenderness. Cardio: regular rate and rhythm, S1, S2 normal, no murmur, click, rub or gallop GI: soft, non-tender; bowel sounds normal; no masses,  no organomegaly Extremities: extremities normal, atraumatic, no cyanosis or edema Neurologic: Alert and oriented X 3, normal strength and tone. Normal symmetric reflexes. Normal coordination and gait  ECOG PERFORMANCE STATUS: 1 - Symptomatic but completely ambulatory  Blood pressure (!) 156/81, pulse 60, temperature 98.6 F (37 C), temperature source Oral, resp. rate 18, height 5\' 9"  (1.753 m), weight 177 lb 9.6 oz (80.6 kg), SpO2 99 %.  LABORATORY DATA: Lab Results  Component Value Date  WBC 5.2 09/29/2018   HGB 13.7 09/29/2018   HCT 40.6 09/29/2018   MCV 97.8 09/29/2018   PLT 148 (L) 09/29/2018      Chemistry      Component Value Date/Time   NA 139 09/17/2018 1114   NA 139 08/10/2012 1328   K 4.0 09/17/2018 1114   K 4.1 08/10/2012 1328   CL 106 09/17/2018 1114   CL 105 08/10/2012 1328   CO2 23 09/17/2018 1114   CO2 29 08/10/2012 1328   BUN 11 09/17/2018 1114   BUN 18.0 08/10/2012 1328   CREATININE 0.97 09/17/2018 1114   CREATININE 0.99 08/27/2018 1130   CREATININE 1.1 08/10/2012 1328      Component Value Date/Time   CALCIUM 9.3 09/17/2018 1114   CALCIUM 9.0 08/10/2012 1328   ALKPHOS 76 09/17/2018 1114   ALKPHOS 144 08/10/2012 1328   AST 19 09/17/2018 1114   AST 20 08/27/2018 1130   AST 42 (H) 08/10/2012 1328   ALT 15 09/17/2018 1114   ALT 14 08/27/2018 1130   ALT 55 08/10/2012 1328   BILITOT 0.6 09/17/2018 1114   BILITOT 0.5 08/27/2018  1130   BILITOT 0.91 08/10/2012 1328       RADIOGRAPHIC STUDIES: Dg Chest 2 View  Result Date: 09/24/2018 CLINICAL DATA:  78 year old male with chest malignancy, preop chest x-ray EXAM: CHEST - 2 VIEW COMPARISON:  08/06/2018, 08/04/2018, PET-CT 07/27/2018 FINDINGS: Cardiomediastinal silhouette unchanged in size and contour. Surgical changes of median sternotomy and CABG. No pneumothorax. No pleural effusion. No new confluent airspace disease. Lateral view demonstrates opacity at the posterior lung base overlying the anterior aspect of the thoracic spine. Osteopenia IMPRESSION: Chronic lung changes without evidence of acute cardiopulmonary disease. Opacity on the lateral view corresponds to known malignancy of the right lower lobe. Surgical changes of prior median sternotomy and CABG Electronically Signed   By: Corrie Mckusick D.O.   On: 09/24/2018 10:05   Dg Fluoro Guide Cv Line-no Report  Result Date: 09/24/2018 Fluoroscopy was utilized by the requesting physician.  No radiographic interpretation.    ASSESSMENT AND PLAN: This is a very pleasant 78 years old white male with a stage IV non-small cell lung cancer, adenocarcinoma presented with large right lower lobe lung mass in addition to mediastinal lymphadenopathy, metastatic disease to the adrenal glands as well as several metastatic bone lesions including the right shoulder, lumbar spine and pelvic bones.  The patient has no actionable mutations. He underwent palliative radiotherapy to the right shoulder and painful metastatic bone lesions. He is feeling much better after the radiotherapy. I recommended for the patient to proceed with the first cycle of his systemic chemotherapy tomorrow with carboplatin for AUC of 5, Alimta 500 mg/M2 and Keytruda 200 mg IV every 3 weeks. I reminded the patient of the adverse effect of this treatment. He will come back for follow-up visit in 3 weeks for evaluation before starting cycle #2 of his treatment. The  patient was advised to call immediately if he has any concerning symptoms in the interval. The patient voices understanding of current disease status and treatment options and is in agreement with the current care plan.  All questions were answered. The patient knows to call the clinic with any problems, questions or concerns. We can certainly see the patient much sooner if necessary.  I spent 15 minutes counseling the patient face to face. The total time spent in the appointment was 25 minutes.  Disclaimer: This note was dictated with voice recognition  software. Similar sounding words can inadvertently be transcribed and may not be corrected upon review.

## 2018-09-29 NOTE — Telephone Encounter (Signed)
Gave patient avs report and appointments for January and February  °

## 2018-09-30 ENCOUNTER — Inpatient Hospital Stay: Payer: Medicare Other

## 2018-09-30 ENCOUNTER — Encounter: Payer: Medicare Other | Admitting: Nutrition

## 2018-09-30 VITALS — BP 150/72 | HR 53 | Temp 97.7°F | Resp 18

## 2018-09-30 DIAGNOSIS — C3491 Malignant neoplasm of unspecified part of right bronchus or lung: Secondary | ICD-10-CM

## 2018-09-30 DIAGNOSIS — Z5112 Encounter for antineoplastic immunotherapy: Secondary | ICD-10-CM | POA: Diagnosis not present

## 2018-09-30 MED ORDER — SODIUM CHLORIDE 0.9 % IV SOLN
466.0000 mg | Freq: Once | INTRAVENOUS | Status: AC
  Administered 2018-09-30: 470 mg via INTRAVENOUS
  Filled 2018-09-30: qty 47

## 2018-09-30 MED ORDER — SODIUM CHLORIDE 0.9 % IV SOLN
Freq: Once | INTRAVENOUS | Status: AC
  Administered 2018-09-30: 10:00:00 via INTRAVENOUS
  Filled 2018-09-30: qty 5

## 2018-09-30 MED ORDER — SODIUM CHLORIDE 0.9% FLUSH
10.0000 mL | INTRAVENOUS | Status: DC | PRN
  Administered 2018-09-30: 10 mL
  Filled 2018-09-30: qty 10

## 2018-09-30 MED ORDER — SODIUM CHLORIDE 0.9 % IV SOLN
505.0000 mg/m2 | Freq: Once | INTRAVENOUS | Status: AC
  Administered 2018-09-30: 1000 mg via INTRAVENOUS
  Filled 2018-09-30: qty 40

## 2018-09-30 MED ORDER — SODIUM CHLORIDE 0.9 % IV SOLN
Freq: Once | INTRAVENOUS | Status: AC
  Administered 2018-09-30: 09:00:00 via INTRAVENOUS
  Filled 2018-09-30: qty 250

## 2018-09-30 MED ORDER — SODIUM CHLORIDE 0.9 % IV SOLN
200.0000 mg | Freq: Once | INTRAVENOUS | Status: AC
  Administered 2018-09-30: 200 mg via INTRAVENOUS
  Filled 2018-09-30: qty 8

## 2018-09-30 MED ORDER — HEPARIN SOD (PORK) LOCK FLUSH 100 UNIT/ML IV SOLN
500.0000 [IU] | Freq: Once | INTRAVENOUS | Status: AC | PRN
  Administered 2018-09-30: 500 [IU]
  Filled 2018-09-30: qty 5

## 2018-09-30 MED ORDER — PALONOSETRON HCL INJECTION 0.25 MG/5ML
INTRAVENOUS | Status: AC
  Filled 2018-09-30: qty 5

## 2018-09-30 MED ORDER — PALONOSETRON HCL INJECTION 0.25 MG/5ML
0.2500 mg | Freq: Once | INTRAVENOUS | Status: AC
  Administered 2018-09-30: 0.25 mg via INTRAVENOUS

## 2018-09-30 NOTE — Patient Instructions (Signed)
[name] Discharge Instructions for Patients Receiving Chemotherapy  Today you received the following chemotherapy agents Keytruda, Alimta, Carboplatin  To help prevent nausea and vomiting after your treatment, we encourage you to take your nausea medication as prescribed.   If you develop nausea and vomiting that is not controlled by your nausea medication, call the clinic.   BELOW ARE SYMPTOMS THAT SHOULD BE REPORTED IMMEDIATELY:  *FEVER GREATER THAN 100.5 F  *CHILLS WITH OR WITHOUT FEVER  NAUSEA AND VOMITING THAT IS NOT CONTROLLED WITH YOUR NAUSEA MEDICATION  *UNUSUAL SHORTNESS OF BREATH  *UNUSUAL BRUISING OR BLEEDING  TENDERNESS IN MOUTH AND THROAT WITH OR WITHOUT PRESENCE OF ULCERS  *URINARY PROBLEMS  *BOWEL PROBLEMS  UNUSUAL RASH Items with * indicate a potential emergency and should be followed up as soon as possible.  Feel free to call the clinic should you have any questions or concerns. The clinic phone number is [phone].  Please show the [facility] at check-in to the Emergency Department and triage nurse.  Pembrolizumab injection Beryle Flock) What is this medicine? PEMBROLIZUMAB (pem broe liz ue mab) is a monoclonal antibody. It is used to treat cervical cancer, esophageal cancer, head and neck cancer, hepatocellular cancer, Hodgkin lymphoma, kidney cancer, lymphoma, melanoma, Merkel cell carcinoma, lung cancer, stomach cancer, urothelial cancer, and cancers that have a certain genetic condition. This medicine may be used for other purposes; ask your health care provider or pharmacist if you have questions. COMMON BRAND NAME(S): Keytruda What should I tell my health care provider before I take this medicine? They need to know if you have any of these conditions: -diabetes -immune system problems -inflammatory bowel disease -liver disease -lung or breathing disease -lupus -received or scheduled to receive an organ transplant or a  stem-cell transplant that uses donor stem cells -an unusual or allergic reaction to pembrolizumab, other medicines, foods, dyes, or preservatives -pregnant or trying to get pregnant -breast-feeding How should I use this medicine? This medicine is for infusion into a vein. It is given by a health care professional in a hospital or clinic setting. A special MedGuide will be given to you before each treatment. Be sure to read this information carefully each time. Talk to your pediatrician regarding the use of this medicine in children. While this drug may be prescribed for selected conditions, precautions do apply. Overdosage: If you think you have taken too much of this medicine contact a poison control center or emergency room at once. NOTE: This medicine is only for you. Do not share this medicine with others. What if I miss a dose? It is important not to miss your dose. Call your doctor or health care professional if you are unable to keep an appointment. What may interact with this medicine? Interactions have not been studied. Give your health care provider a list of all the medicines, herbs, non-prescription drugs, or dietary supplements you use. Also tell them if you smoke, drink alcohol, or use illegal drugs. Some items may interact with your medicine. This list may not describe all possible interactions. Give your health care provider a list of all the medicines, herbs, non-prescription drugs, or dietary supplements you use. Also tell them if you smoke, drink alcohol, or use illegal drugs. Some items may interact with your medicine. What should I watch for while using this medicine? Your condition will be monitored carefully while you are receiving this medicine. You may need blood work done while you are taking this medicine. Do not  become pregnant while taking this medicine or for 4 months after stopping it. Women should inform their doctor if they wish to become pregnant or think they  might be pregnant. There is a potential for serious side effects to an unborn child. Talk to your health care professional or pharmacist for more information. Do not breast-feed an infant while taking this medicine or for 4 months after the last dose. What side effects may I notice from receiving this medicine? Side effects that you should report to your doctor or health care professional as soon as possible: -allergic reactions like skin rash, itching or hives, swelling of the face, lips, or tongue -bloody or black, tarry -breathing problems -changes in vision -chest pain -chills -confusion -constipation -cough -diarrhea -dizziness or feeling faint or lightheaded -fast or irregular heartbeat -fever -flushing -hair loss -joint pain -low blood counts - this medicine may decrease the number of white blood cells, red blood cells and platelets. You may be at increased risk for infections and bleeding. -muscle pain -muscle weakness -persistent headache -redness, blistering, peeling or loosening of the skin, including inside the mouth -signs and symptoms of high blood sugar such as dizziness; dry mouth; dry skin; fruity breath; nausea; stomach pain; increased hunger or thirst; increased urination -signs and symptoms of kidney injury like trouble passing urine or change in the amount of urine -signs and symptoms of liver injury like dark urine, light-colored stools, loss of appetite, nausea, right upper belly pain, yellowing of the eyes or skin -sweating -swollen lymph nodes -weight loss Side effects that usually do not require medical attention (report to your doctor or health care professional if they continue or are bothersome): -decreased appetite -muscle pain -tiredness This list may not describe all possible side effects. Call your doctor for medical advice about side effects. You may report side effects to FDA at 1-800-FDA-1088. Where should I keep my medicine? This drug is given  in a hospital or clinic and will not be stored at home. NOTE: This sheet is a summary. It may not cover all possible information. If you have questions about this medicine, talk to your doctor, pharmacist, or health care provider.  2019 Elsevier/Gold Standard (2018-04-23 15:06:10) Pemetrexed injection (Alimta) What is this medicine? PEMETREXED (PEM e TREX ed) is a chemotherapy drug used to treat lung cancers like non-small cell lung cancer and mesothelioma. It may also be used to treat other cancers. This medicine may be used for other purposes; ask your health care provider or pharmacist if you have questions. COMMON BRAND NAME(S): Alimta What should I tell my health care provider before I take this medicine? They need to know if you have any of these conditions: -infection (especially a virus infection such as chickenpox, cold sores, or herpes) -kidney disease -low blood counts, like low white cell, platelet, or red cell counts -lung or breathing disease, like asthma -radiation therapy -an unusual or allergic reaction to pemetrexed, other medicines, foods, dyes, or preservative -pregnant or trying to get pregnant -breast-feeding How should I use this medicine? This drug is given as an infusion into a vein. It is administered in a hospital or clinic by a specially trained health care professional. Talk to your pediatrician regarding the use of this medicine in children. Special care may be needed. Overdosage: If you think you have taken too much of this medicine contact a poison control center or emergency room at once. NOTE: This medicine is only for you. Do not share this medicine with others.  What if I miss a dose? It is important not to miss your dose. Call your doctor or health care professional if you are unable to keep an appointment. What may interact with this medicine? This medicine may interact with the following medications: -Ibuprofen This list may not describe all possible  interactions. Give your health care provider a list of all the medicines, herbs, non-prescription drugs, or dietary supplements you use. Also tell them if you smoke, drink alcohol, or use illegal drugs. Some items may interact with your medicine. What should I watch for while using this medicine? Visit your doctor for checks on your progress. This drug may make you feel generally unwell. This is not uncommon, as chemotherapy can affect healthy cells as well as cancer cells. Report any side effects. Continue your course of treatment even though you feel ill unless your doctor tells you to stop. In some cases, you may be given additional medicines to help with side effects. Follow all directions for their use. Call your doctor or health care professional for advice if you get a fever, chills or sore throat, or other symptoms of a cold or flu. Do not treat yourself. This drug decreases your body's ability to fight infections. Try to avoid being around people who are sick. This medicine may increase your risk to bruise or bleed. Call your doctor or health care professional if you notice any unusual bleeding. Be careful brushing and flossing your teeth or using a toothpick because you may get an infection or bleed more easily. If you have any dental work done, tell your dentist you are receiving this medicine. Avoid taking products that contain aspirin, acetaminophen, ibuprofen, naproxen, or ketoprofen unless instructed by your doctor. These medicines may hide a fever. Call your doctor or health care professional if you get diarrhea or mouth sores. Do not treat yourself. To protect your kidneys, drink water or other fluids as directed while you are taking this medicine. Do not become pregnant while taking this medicine or for 6 months after stopping it. Women should inform their doctor if they wish to become pregnant or think they might be pregnant. Men should not father a child while taking this medicine and  for 3 months after stopping it. This may interfere with the ability to father a child. You should talk to your doctor or health care professional if you are concerned about your fertility. There is a potential for serious side effects to an unborn child. Talk to your health care professional or pharmacist for more information. Do not breast-feed an infant while taking this medicine or for 1 week after stopping it. What side effects may I notice from receiving this medicine? Side effects that you should report to your doctor or health care professional as soon as possible: -allergic reactions like skin rash, itching or hives, swelling of the face, lips, or tongue -breathing problems -redness, blistering, peeling or loosening of the skin, including inside the mouth -signs and symptoms of bleeding such as bloody or black, tarry stools; red or dark-brown urine; spitting up blood or brown material that looks like coffee grounds; red spots on the skin; unusual bruising or bleeding from the eye, gums, or nose -signs and symptoms of infection like fever or chills; cough; sore throat; pain or trouble passing urine -signs and symptoms of kidney injury like trouble passing urine or change in the amount of urine -signs and symptoms of liver injury like dark yellow or brown urine; general ill feeling or  flu-like symptoms; light-colored stools; loss of appetite; nausea; right upper belly pain; unusually weak or tired; yellowing of the eyes or skin Side effects that usually do not require medical attention (report to your doctor or health care professional if they continue or are bothersome): -constipation -mouth sores -nausea, vomiting -unusually weak or tired This list may not describe all possible side effects. Call your doctor for medical advice about side effects. You may report side effects to FDA at 1-800-FDA-1088. Where should I keep my medicine? This drug is given in a hospital or clinic and will not be  stored at home. NOTE: This sheet is a summary. It may not cover all possible information. If you have questions about this medicine, talk to your doctor, pharmacist, or health care provider.  2019 Elsevier/Gold Standard (2017-10-29 16:11:33)  Carboplatin injection What is this medicine? CARBOPLATIN (KAR boe pla tin) is a chemotherapy drug. It targets fast dividing cells, like cancer cells, and causes these cells to die. This medicine is used to treat ovarian cancer and many other cancers. This medicine may be used for other purposes; ask your health care provider or pharmacist if you have questions. COMMON BRAND NAME(S): Paraplatin What should I tell my health care provider before I take this medicine? They need to know if you have any of these conditions: -blood disorders -hearing problems -kidney disease -recent or ongoing radiation therapy -an unusual or allergic reaction to carboplatin, cisplatin, other chemotherapy, other medicines, foods, dyes, or preservatives -pregnant or trying to get pregnant -breast-feeding How should I use this medicine? This drug is usually given as an infusion into a vein. It is administered in a hospital or clinic by a specially trained health care professional. Talk to your pediatrician regarding the use of this medicine in children. Special care may be needed. Overdosage: If you think you have taken too much of this medicine contact a poison control center or emergency room at once. NOTE: This medicine is only for you. Do not share this medicine with others. What if I miss a dose? It is important not to miss a dose. Call your doctor or health care professional if you are unable to keep an appointment. What may interact with this medicine? -medicines for seizures -medicines to increase blood counts like filgrastim, pegfilgrastim, sargramostim -some antibiotics like amikacin, gentamicin, neomycin, streptomycin, tobramycin -vaccines Talk to your doctor or  health care professional before taking any of these medicines: -acetaminophen -aspirin -ibuprofen -ketoprofen -naproxen This list may not describe all possible interactions. Give your health care provider a list of all the medicines, herbs, non-prescription drugs, or dietary supplements you use. Also tell them if you smoke, drink alcohol, or use illegal drugs. Some items may interact with your medicine. What should I watch for while using this medicine? Your condition will be monitored carefully while you are receiving this medicine. You will need important blood work done while you are taking this medicine. This drug may make you feel generally unwell. This is not uncommon, as chemotherapy can affect healthy cells as well as cancer cells. Report any side effects. Continue your course of treatment even though you feel ill unless your doctor tells you to stop. In some cases, you may be given additional medicines to help with side effects. Follow all directions for their use. Call your doctor or health care professional for advice if you get a fever, chills or sore throat, or other symptoms of a cold or flu. Do not treat yourself. This drug decreases  your body's ability to fight infections. Try to avoid being around people who are sick. This medicine may increase your risk to bruise or bleed. Call your doctor or health care professional if you notice any unusual bleeding. Be careful brushing and flossing your teeth or using a toothpick because you may get an infection or bleed more easily. If you have any dental work done, tell your dentist you are receiving this medicine. Avoid taking products that contain aspirin, acetaminophen, ibuprofen, naproxen, or ketoprofen unless instructed by your doctor. These medicines may hide a fever. Do not become pregnant while taking this medicine. Women should inform their doctor if they wish to become pregnant or think they might be pregnant. There is a potential for  serious side effects to an unborn child. Talk to your health care professional or pharmacist for more information. Do not breast-feed an infant while taking this medicine. What side effects may I notice from receiving this medicine? Side effects that you should report to your doctor or health care professional as soon as possible: -allergic reactions like skin rash, itching or hives, swelling of the face, lips, or tongue -signs of infection - fever or chills, cough, sore throat, pain or difficulty passing urine -signs of decreased platelets or bleeding - bruising, pinpoint red spots on the skin, black, tarry stools, nosebleeds -signs of decreased red blood cells - unusually weak or tired, fainting spells, lightheadedness -breathing problems -changes in hearing -changes in vision -chest pain -high blood pressure -low blood counts - This drug may decrease the number of white blood cells, red blood cells and platelets. You may be at increased risk for infections and bleeding. -nausea and vomiting -pain, swelling, redness or irritation at the injection site -pain, tingling, numbness in the hands or feet -problems with balance, talking, walking -trouble passing urine or change in the amount of urine Side effects that usually do not require medical attention (report to your doctor or health care professional if they continue or are bothersome): -hair loss -loss of appetite -metallic taste in the mouth or changes in taste This list may not describe all possible side effects. Call your doctor for medical advice about side effects. You may report side effects to FDA at 1-800-FDA-1088. Where should I keep my medicine? This drug is given in a hospital or clinic and will not be stored at home. NOTE: This sheet is a summary. It may not cover all possible information. If you have questions about this medicine, talk to your doctor, pharmacist, or health care provider.  2019 Elsevier/Gold Standard  (2007-12-15 14:38:05)

## 2018-10-05 ENCOUNTER — Ambulatory Visit
Admission: RE | Admit: 2018-10-05 | Discharge: 2018-10-05 | Disposition: A | Discharge status: Home / Self Care | Payer: Medicare Other | Source: Ambulatory Visit | Attending: Radiation Oncology | Admitting: Radiation Oncology

## 2018-10-05 ENCOUNTER — Other Ambulatory Visit: Payer: Self-pay

## 2018-10-05 ENCOUNTER — Encounter: Payer: Self-pay | Admitting: Radiation Oncology

## 2018-10-05 VITALS — BP 130/62 | HR 57 | Temp 98.3°F | Wt 177.0 lb

## 2018-10-05 VITALS — BP 130/62 | HR 57 | Temp 98.3°F | Resp 18 | Ht 69.0 in | Wt 177.0 lb

## 2018-10-05 DIAGNOSIS — Z7982 Long term (current) use of aspirin: Secondary | ICD-10-CM | POA: Insufficient documentation

## 2018-10-05 DIAGNOSIS — C7931 Secondary malignant neoplasm of brain: Secondary | ICD-10-CM | POA: Diagnosis not present

## 2018-10-05 DIAGNOSIS — Z79899 Other long term (current) drug therapy: Secondary | ICD-10-CM | POA: Diagnosis not present

## 2018-10-05 DIAGNOSIS — C7951 Secondary malignant neoplasm of bone: Secondary | ICD-10-CM | POA: Insufficient documentation

## 2018-10-05 DIAGNOSIS — Z923 Personal history of irradiation: Secondary | ICD-10-CM | POA: Insufficient documentation

## 2018-10-05 DIAGNOSIS — C3491 Malignant neoplasm of unspecified part of right bronchus or lung: Secondary | ICD-10-CM

## 2018-10-05 HISTORY — DX: Personal history of irradiation: Z92.3

## 2018-10-05 NOTE — Progress Notes (Signed)
Radiation Oncology         [phone] ________________________________  Name: Tony [name] MRN: [id number]  Date: 10/05/2018  DOB: 07/07/1941  Follow-Up Visit Note  CC: [name], MD  [name], MD    ICD-10-CM   1. 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 Spine, C1 / 30 Gy in 10 fractions of 3 Gy 2. Scapula, Right Glenoid / 30 Gy in 10 fractions of 3 Gy  Narrative:  The patient returns today for routine follow-up.  he is doing well overall. They are accompanied by a male family member.   Since they were last seen in the office, on January 2nd he had a Port-A-Cath placed in his left subclavian vein to provide IV access for chemotherapy. He is scheduled for palliative systemic chemotherapy with carboplatin for an AUC of 5, Alimta 500 mg/m and Keytruda 200 mg IV every 3 weeks. He received his first dose with Dr. Julien Nordmann on January 8. His next dose will occur on January 28.            On review of systems, he reports some bowel and stomach irritation following recent chemotherapy. He reports improved shoulder pain since radiation. He had a minor rash on his right shoulder but it is fading. Pertinent positives are listed and detailed within the above HPI.                 ALLERGIES:  has No Known Allergies.  Meds: Current Outpatient Medications  Medication Sig Dispense Refill  . acetaminophen (TYLENOL) 650 MG CR tablet Take 650 mg by mouth every 8 (eight) hours as needed for pain.     Marland Kitchen aspirin 81 MG tablet Take 81 mg by mouth every evening.     . colchicine (COLCRYS) 0.6 MG tablet Take 0.6  mg by mouth daily as needed (for gout flare up).     . folic acid (FOLVITE) 834 MCG tablet Take 800 mcg by mouth daily.     Marland Kitchen lidocaine-prilocaine (EMLA) cream Apply 1 application topically as needed. 30 g 1  . LORazepam (ATIVAN) 1 MG tablet Take 1-2 tablets by mouth 15 min prior to brain radiation planning, prior to brain radiation, and prior to future MRI, PRN anxiety. 6 tablet 0  . metoprolol tartrate (LOPRESSOR) 50 MG tablet Take 50 mg by mouth 2 (two) times daily.    . Multiple Vitamins-Minerals (MULTIVITAMIN WITH MINERALS) tablet Take 1 tablet by mouth daily.    . niacin 500 MG tablet Take 500 mg by mouth 2 (two) times daily.    . Omega-3 Fatty Acids (FISH OIL) 1200 MG CAPS Take 1,200 mg by mouth daily.     Marland Kitchen omeprazole (PRILOSEC OTC) 20 MG tablet Take 10 mg by mouth 2 (two) times a week.    . polyethylene glycol (MIRALAX / GLYCOLAX) packet Take 17 g by mouth 4 (four) times a week.     . pravastatin (PRAVACHOL) 10 MG tablet Take 10 mg by mouth every evening.     . prochlorperazine (COMPAZINE) 10 MG tablet Take 1 tablet (10 mg total) by mouth every  6 (six) hours as needed for nausea or vomiting. 30 tablet 1  . Psyllium (METAMUCIL FIBER PO) Take 1 Dose by mouth 3 (three) times a week.     . ramipril (ALTACE) 10 MG capsule Take 10 mg by mouth every morning.     . triamterene-hydrochlorothiazide (MAXZIDE-25) 37.5-25 MG tablet Take 0.5 tablets by mouth every morning.     . bisacodyl (DULCOLAX) 10 MG suppository Place 1 suppository (10 mg total) rectally as needed for moderate constipation. (Patient not taking: Reported on 10/05/2018) 12 suppository 0  . traMADol (ULTRAM) 50 MG tablet Take 1-2 tablets (50-100 mg total) by mouth every 6 (six) hours as needed for moderate pain. (Patient not taking: Reported on 10/05/2018) 20 tablet 0   No current facility-administered medications for this encounter.     Physical Findings: The patient is in no acute distress. Patient is alert and oriented.  height  is 5\' 9"  (1.753 m) and weight is 177 lb (80.3 kg). His oral temperature is 98.3 F (36.8 C). His blood pressure is 130/62 and his pulse is 57 (abnormal). His respiration is 18 and oxygen saturation is 100%. .  No significant changes. Lungs are clear to auscultation bilaterally. Heart has regular rate and rhythm. No palpable cervical, supraclavicular, or axillary adenopathy. Abdomen soft, non-tender, normal bowel sounds.   Lab Findings: Lab Results  Component Value Date   WBC 5.2 09/29/2018   HGB 13.7 09/29/2018   HCT 40.6 09/29/2018   MCV 97.8 09/29/2018   PLT 148 (L) 09/29/2018    Radiographic Findings: Dg Chest 2 View  Result Date: 09/24/2018 CLINICAL DATA:  78 year old male with chest malignancy, preop chest x-ray EXAM: CHEST - 2 VIEW COMPARISON:  08/06/2018, 08/04/2018, PET-CT 07/27/2018 FINDINGS: Cardiomediastinal silhouette unchanged in size and contour. Surgical changes of median sternotomy and CABG. No pneumothorax. No pleural effusion. No new confluent airspace disease. Lateral view demonstrates opacity at the posterior lung base overlying the anterior aspect of the thoracic spine. Osteopenia IMPRESSION: Chronic lung changes without evidence of acute cardiopulmonary disease. Opacity on the lateral view corresponds to known malignancy of the right lower lobe. Surgical changes of prior median sternotomy and CABG Electronically Signed   By: Corrie Mckusick D.O.   On: 09/24/2018 10:05   Dg Fluoro Guide Cv Line-no Report  Result Date: 09/24/2018 Fluoroscopy was utilized by the requesting physician.  No radiographic interpretation.    Impression:  The patient is recovering from the effects of radiation.  Overall his pain in his left upper neck and right shoulder has improved with his palliative radiation therapy.   Plan:  PRN follow-up. Patient will continue close follow-up with Dr. Isidore Moos concerning his [place]. Will also continue follow-up with medical oncology for his palliative chemotherapy.    ____________________________________   Blair Promise, PhD, MD    This document serves as a record of services personally performed by Gery Pray, MD. It was created on his behalf by Mary-Margaret Loma Messing, a trained medical scribe. The creation of this record is based on the scribe's personal observations and the provider's statements to them. This document has been checked and approved by the attending provider.

## 2018-10-05 NOTE — Progress Notes (Signed)
Pt presents today for f/u with Dr. Sondra Come. Pt is accompanied by wife. Pt denies c/o pain at this time but states has occasional neck pain when lying down. Pt reports that this pain has improved since having radiation and chemotherapy. Pt is still experiencing fatigue which pt attributes to chemotherapy. Pt receives chemotherapy every 3 weeks. Pt reports that radiation dermatitis has faded. Pt denies N/V, except rare "faint" feelings of nausea which pass without intervention.   BP 130/62 (BP Location: Left Arm, Patient Position: Sitting)   Pulse (!) 57   Temp 98.3 F (36.8 C) (Oral)   Resp 18   Ht 5\' 9"  (1.753 m)   Wt 177 lb (80.3 kg)   SpO2 100%   BMI 26.14 kg/m   Wt Readings from Last 3 Encounters:  10/05/18 177 lb (80.3 kg)  09/29/18 177 lb 9.6 oz (80.6 kg)  09/24/18 179 lb 5 oz (81.3 kg)   Loma Sousa, RN BSN

## 2018-10-06 ENCOUNTER — Telehealth: Payer: Self-pay | Admitting: *Deleted

## 2018-10-06 ENCOUNTER — Inpatient Hospital Stay: Payer: Medicare Other

## 2018-10-06 DIAGNOSIS — Z95828 Presence of other vascular implants and grafts: Secondary | ICD-10-CM

## 2018-10-06 DIAGNOSIS — Z5112 Encounter for antineoplastic immunotherapy: Secondary | ICD-10-CM | POA: Diagnosis not present

## 2018-10-06 DIAGNOSIS — C3491 Malignant neoplasm of unspecified part of right bronchus or lung: Secondary | ICD-10-CM

## 2018-10-06 LAB — CBC WITH DIFFERENTIAL (CANCER CENTER ONLY)
Abs Immature Granulocytes: 0.28 10*3/uL — ABNORMAL HIGH (ref 0.00–0.07)
BASOS ABS: 0 10*3/uL (ref 0.0–0.1)
Basophils Relative: 1 %
Eosinophils Absolute: 0.1 10*3/uL (ref 0.0–0.5)
Eosinophils Relative: 2 %
HCT: 37.2 % — ABNORMAL LOW (ref 39.0–52.0)
Hemoglobin: 12.8 g/dL — ABNORMAL LOW (ref 13.0–17.0)
Immature Granulocytes: 6 %
Lymphocytes Relative: 17 %
Lymphs Abs: 0.9 10*3/uL (ref 0.7–4.0)
MCH: 33.2 pg (ref 26.0–34.0)
MCHC: 34.4 g/dL (ref 30.0–36.0)
MCV: 96.6 fL (ref 80.0–100.0)
Monocytes Absolute: 0.1 10*3/uL (ref 0.1–1.0)
Monocytes Relative: 1 %
Neutro Abs: 3.6 10*3/uL (ref 1.7–7.7)
Neutrophils Relative %: 73 %
PLATELETS: 98 10*3/uL — AB (ref 150–400)
RBC: 3.85 MIL/uL — ABNORMAL LOW (ref 4.22–5.81)
RDW: 12.3 % (ref 11.5–15.5)
WBC Count: 4.9 10*3/uL (ref 4.0–10.5)
nRBC: 0 % (ref 0.0–0.2)

## 2018-10-06 LAB — CMP (CANCER CENTER ONLY)
ALT: 21 U/L (ref 0–44)
AST: 14 U/L — ABNORMAL LOW (ref 15–41)
Albumin: 3.5 g/dL (ref 3.5–5.0)
Alkaline Phosphatase: 100 U/L (ref 38–126)
Anion gap: 10 (ref 5–15)
BUN: 25 mg/dL — ABNORMAL HIGH (ref 8–23)
CO2: 24 mmol/L (ref 22–32)
Calcium: 9.3 mg/dL (ref 8.9–10.3)
Chloride: 102 mmol/L (ref 98–111)
Creatinine: 0.94 mg/dL (ref 0.61–1.24)
GFR, Est AFR Am: >60 mL/min (ref >60)
GFR, Estimated: >60 mL/min (ref >60)
Glucose, Bld: 140 mg/dL — ABNORMAL HIGH (ref 70–99)
Potassium: 4 mmol/L (ref 3.5–5.1)
Sodium: 136 mmol/L (ref 135–145)
Total Bilirubin: 1 mg/dL (ref 0.3–1.2)
Total Protein: 7.2 g/dL (ref 6.5–8.1)

## 2018-10-06 MED ORDER — SODIUM CHLORIDE 0.9% FLUSH
10.0000 mL | INTRAVENOUS | Status: DC | PRN
  Administered 2018-10-06: 10 mL via INTRAVENOUS
  Filled 2018-10-06: qty 10

## 2018-10-06 MED ORDER — HEPARIN SOD (PORK) LOCK FLUSH 100 UNIT/ML IV SOLN
500.0000 [IU] | Freq: Once | INTRAVENOUS | Status: AC
  Administered 2018-10-06: 500 [IU] via INTRAVENOUS
  Filled 2018-10-06: qty 5

## 2018-10-06 NOTE — Telephone Encounter (Signed)
TCT patient to request he return to the cancer center today to have lab work redone as the sample from earlier in the day was hemolysed. Left message with above information, asking pt to call back

## 2018-10-07 ENCOUNTER — Encounter: Payer: Self-pay | Admitting: Radiation Oncology

## 2018-10-07 NOTE — Progress Notes (Signed)
Radiation Oncology         [phone] ________________________________  Name: Tony [name] MRN: [id number]  Date: 10/05/2018  DOB: 25-Sep-1940  Follow-Up Visit Note  Outpatient  CC: Tony Ruff, MD  Tony Ruff, MD  Diagnosis and Prior Radiotherapy:    ICD-10-CM   1. Brain metastases [facility]) C79.31     Radiation treatment date:   09/01/18  Site/dose:   Brain, cerebellar (5 lesions) / 20 Gy in 1 fraction: Tony [name] received stereotactic radiosurgery to the following targets with a total of 6 VMAT beams: PTV1 Rt Post Fossa 35mm 20 Gy PTV2 Rt  Post Fossa 31mm 20 Gy PTV3 Mid Post Fossa 99mm 20 Gy PTV4 Rt Post Fossa 41mm 20 Gy PTV5 Lt Post Fossa 31mm 20Gy   CHIEF COMPLAINT: Here for follow-up and surveillance of metastatic cancer to brain  Narrative:  The patient returns today for routine follow-up.  He is doing well overall.  He has headaches that [name] and go but are not severe and are related to pillow firmness affecting the back of his head.  Sometimes he feels like he is in a fog.  He is eating and drinking well but he has a decreased appetite.  He just saw Dr. Sondra [name] for an additional follow-up today.                      ALLERGIES:  has No Known Allergies.  Meds: Current Outpatient Medications  Medication Sig Dispense Refill  . acetaminophen (TYLENOL) 650 MG CR tablet Take 650 mg by mouth every 8 (eight) hours as needed for pain.     Marland Kitchen aspirin 81 MG tablet Take 81 mg by mouth every evening.     . bisacodyl (DULCOLAX) 10 MG suppository Place 1 suppository (10 mg total) rectally as needed for moderate constipation. (Patient not taking: Reported on 10/05/2018) 12 suppository 0  . colchicine (COLCRYS) 0.6 MG tablet Take 0.6 mg by mouth daily as needed (for gout flare up).     . folic acid (FOLVITE) 196 MCG tablet Take 800 mcg by mouth daily.     Marland Kitchen lidocaine-prilocaine (EMLA) cream Apply 1 application topically as needed. 30 g 1  . LORazepam (ATIVAN) 1 MG tablet  Take 1-2 tablets by mouth 15 min prior to brain radiation planning, prior to brain radiation, and prior to future MRI, PRN anxiety. 6 tablet 0  . metoprolol tartrate (LOPRESSOR) 50 MG tablet Take 50 mg by mouth 2 (two) times daily.    . Multiple Vitamins-Minerals (MULTIVITAMIN WITH MINERALS) tablet Take 1 tablet by mouth daily.    . niacin 500 MG tablet Take 500 mg by mouth 2 (two) times daily.    . Omega-3 Fatty Acids (FISH OIL) 1200 MG CAPS Take 1,200 mg by mouth daily.     Marland Kitchen omeprazole (PRILOSEC OTC) 20 MG tablet Take 10 mg by mouth 2 (two) times a week.    . polyethylene glycol (MIRALAX / GLYCOLAX) packet Take 17 g by mouth 4 (four) times a week.     . pravastatin (PRAVACHOL) 10 MG tablet Take 10 mg by mouth every evening.     . prochlorperazine (COMPAZINE) 10 MG tablet Take 1 tablet (10 mg total) by mouth every 6 (six) hours as needed for nausea or vomiting. 30 tablet 1  . Psyllium (METAMUCIL FIBER PO) Take 1 Dose by mouth 3 (three) times a week.     . ramipril (ALTACE) 10 MG capsule Take 10 mg by  mouth every morning.     . traMADol (ULTRAM) 50 MG tablet Take 1-2 tablets (50-100 mg total) by mouth every 6 (six) hours as needed for moderate pain. (Patient not taking: Reported on 10/05/2018) 20 tablet 0  . triamterene-hydrochlorothiazide (MAXZIDE-25) 37.5-25 MG tablet Take 0.5 tablets by mouth every morning.      No current facility-administered medications for this encounter.     Physical Findings: The patient is in no acute distress. Patient is alert and oriented.  weight is 177 lb (80.3 kg). His oral temperature is 98.3 F (36.8 C). His blood pressure is 130/62 and his pulse is 57 (abnormal). His oxygen saturation is 100%. .    Neurologically the patient is nonfocal.  Cranial nerves are grossly intact.  Musculoskeletal strength is symmetric.  Coordination is grossly intact.  Speech is fluent  Lab Findings: Lab Results  Component Value Date   WBC 4.9 10/06/2018   HGB 12.8 (L)  10/06/2018   HCT 37.2 (L) 10/06/2018   MCV 96.6 10/06/2018   PLT 98 (L) 10/06/2018    Radiographic Findings: Dg Chest 2 View  Result Date: 09/24/2018 CLINICAL DATA:  78 year old male with chest malignancy, preop chest x-ray EXAM: CHEST - 2 VIEW COMPARISON:  08/06/2018, 08/04/2018, PET-CT 07/27/2018 FINDINGS: Cardiomediastinal silhouette unchanged in size and contour. Surgical changes of median sternotomy and CABG. No pneumothorax. No pleural effusion. No new confluent airspace disease. Lateral view demonstrates opacity at the posterior lung base overlying the anterior aspect of the thoracic spine. Osteopenia IMPRESSION: Chronic lung changes without evidence of acute cardiopulmonary disease. Opacity on the lateral view corresponds to known malignancy of the right lower lobe. Surgical changes of prior median sternotomy and CABG Electronically Signed   By: Corrie Mckusick D.O.   On: 09/24/2018 10:05   Dg Fluoro Guide Cv Line-no Report  Result Date: 09/24/2018 Fluoroscopy was utilized by the requesting physician.  No radiographic interpretation.    Impression/Plan: He is doing well 1 month out from radiosurgery to the brain.  I will ask our patient navigator to arrange an MRI in 2 months and for him to follow-up with neurosurgery.  At that time his case will be reviewed at her CNS tumor board.  He is pleased with this plan.    Tony Gibson, MD

## 2018-10-13 ENCOUNTER — Inpatient Hospital Stay: Payer: Medicare Other

## 2018-10-13 ENCOUNTER — Emergency Department (HOSPITAL_COMMUNITY): Payer: Medicare Other

## 2018-10-13 ENCOUNTER — Inpatient Hospital Stay (HOSPITAL_BASED_OUTPATIENT_CLINIC_OR_DEPARTMENT_OTHER): Payer: Medicare Other | Admitting: Medical

## 2018-10-13 ENCOUNTER — Encounter (HOSPITAL_COMMUNITY): Payer: Self-pay

## 2018-10-13 ENCOUNTER — Observation Stay (HOSPITAL_COMMUNITY)
Admission: EM | Admit: 2018-10-13 | Discharge: 2018-10-14 | Disposition: A | Discharge status: Home / Self Care | Payer: Medicare Other | Attending: Internal Medicine | Admitting: Internal Medicine

## 2018-10-13 ENCOUNTER — Other Ambulatory Visit: Payer: Self-pay

## 2018-10-13 VITALS — BP 147/75 | HR 77 | Temp 97.1°F | Resp 18

## 2018-10-13 DIAGNOSIS — D6959 Other secondary thrombocytopenia: Secondary | ICD-10-CM | POA: Diagnosis not present

## 2018-10-13 DIAGNOSIS — Z951 Presence of aortocoronary bypass graft: Secondary | ICD-10-CM | POA: Diagnosis not present

## 2018-10-13 DIAGNOSIS — Z8546 Personal history of malignant neoplasm of prostate: Secondary | ICD-10-CM | POA: Insufficient documentation

## 2018-10-13 DIAGNOSIS — Z8249 Family history of ischemic heart disease and other diseases of the circulatory system: Secondary | ICD-10-CM | POA: Insufficient documentation

## 2018-10-13 DIAGNOSIS — D696 Thrombocytopenia, unspecified: Secondary | ICD-10-CM | POA: Diagnosis not present

## 2018-10-13 DIAGNOSIS — I2510 Atherosclerotic heart disease of native coronary artery without angina pectoris: Secondary | ICD-10-CM | POA: Diagnosis not present

## 2018-10-13 DIAGNOSIS — C7931 Secondary malignant neoplasm of brain: Secondary | ICD-10-CM | POA: Insufficient documentation

## 2018-10-13 DIAGNOSIS — C7951 Secondary malignant neoplasm of bone: Secondary | ICD-10-CM

## 2018-10-13 DIAGNOSIS — Z79899 Other long term (current) drug therapy: Secondary | ICD-10-CM | POA: Diagnosis not present

## 2018-10-13 DIAGNOSIS — C3431 Malignant neoplasm of lower lobe, right bronchus or lung: Secondary | ICD-10-CM | POA: Insufficient documentation

## 2018-10-13 DIAGNOSIS — Z87891 Personal history of nicotine dependence: Secondary | ICD-10-CM

## 2018-10-13 DIAGNOSIS — Z9221 Personal history of antineoplastic chemotherapy: Secondary | ICD-10-CM | POA: Diagnosis not present

## 2018-10-13 DIAGNOSIS — W101XXA Fall (on)(from) sidewalk curb, initial encounter: Secondary | ICD-10-CM

## 2018-10-13 DIAGNOSIS — S0093XA Contusion of unspecified part of head, initial encounter: Secondary | ICD-10-CM | POA: Diagnosis not present

## 2018-10-13 DIAGNOSIS — I714 Abdominal aortic aneurysm, without rupture: Secondary | ICD-10-CM | POA: Diagnosis not present

## 2018-10-13 DIAGNOSIS — C3491 Malignant neoplasm of unspecified part of right bronchus or lung: Secondary | ICD-10-CM

## 2018-10-13 DIAGNOSIS — C3490 Malignant neoplasm of unspecified part of unspecified bronchus or lung: Secondary | ICD-10-CM | POA: Diagnosis present

## 2018-10-13 DIAGNOSIS — S0990XA Unspecified injury of head, initial encounter: Secondary | ICD-10-CM | POA: Diagnosis not present

## 2018-10-13 DIAGNOSIS — W19XXXA Unspecified fall, initial encounter: Secondary | ICD-10-CM | POA: Insufficient documentation

## 2018-10-13 DIAGNOSIS — Z5112 Encounter for antineoplastic immunotherapy: Secondary | ICD-10-CM | POA: Diagnosis not present

## 2018-10-13 DIAGNOSIS — M109 Gout, unspecified: Secondary | ICD-10-CM | POA: Insufficient documentation

## 2018-10-13 DIAGNOSIS — E785 Hyperlipidemia, unspecified: Secondary | ICD-10-CM

## 2018-10-13 DIAGNOSIS — I252 Old myocardial infarction: Secondary | ICD-10-CM | POA: Diagnosis not present

## 2018-10-13 DIAGNOSIS — I739 Peripheral vascular disease, unspecified: Secondary | ICD-10-CM | POA: Insufficient documentation

## 2018-10-13 DIAGNOSIS — Z7982 Long term (current) use of aspirin: Secondary | ICD-10-CM | POA: Insufficient documentation

## 2018-10-13 DIAGNOSIS — T1490XA Injury, unspecified, initial encounter: Secondary | ICD-10-CM

## 2018-10-13 DIAGNOSIS — I10 Essential (primary) hypertension: Secondary | ICD-10-CM | POA: Diagnosis not present

## 2018-10-13 DIAGNOSIS — Z95828 Presence of other vascular implants and grafts: Secondary | ICD-10-CM

## 2018-10-13 DIAGNOSIS — S0083XA Contusion of other part of head, initial encounter: Secondary | ICD-10-CM

## 2018-10-13 HISTORY — DX: Essential (primary) hypertension: I10

## 2018-10-13 HISTORY — DX: Malignant neoplasm of unspecified part of unspecified bronchus or lung: C34.90

## 2018-10-13 HISTORY — DX: Hyperlipidemia, unspecified: E78.5

## 2018-10-13 HISTORY — DX: Secondary malignant neoplasm of brain: C79.31

## 2018-10-13 LAB — CBC WITH DIFFERENTIAL (CANCER CENTER ONLY)
Abs Immature Granulocytes: 0.11 10*3/uL — ABNORMAL HIGH (ref 0.00–0.07)
Basophils Absolute: 0 10*3/uL (ref 0.0–0.1)
Basophils Relative: 1 %
EOS PCT: 1 %
Eosinophils Absolute: 0 10*3/uL (ref 0.0–0.5)
HCT: 30.3 % — ABNORMAL LOW (ref 39.0–52.0)
Hemoglobin: 10.7 g/dL — ABNORMAL LOW (ref 13.0–17.0)
Immature Granulocytes: 7 %
Lymphocytes Relative: 37 %
Lymphs Abs: 0.6 10*3/uL — ABNORMAL LOW (ref 0.7–4.0)
MCH: 33.4 pg (ref 26.0–34.0)
MCHC: 35.3 g/dL (ref 30.0–36.0)
MCV: 94.7 fL (ref 80.0–100.0)
Monocytes Absolute: 0.3 10*3/uL (ref 0.1–1.0)
Monocytes Relative: 16 %
NRBC: 0 % (ref 0.0–0.2)
Neutro Abs: 0.6 10*3/uL — ABNORMAL LOW (ref 1.7–7.7)
Neutrophils Relative %: 38 %
Platelet Count: 18 10*3/uL — ABNORMAL LOW (ref 150–400)
RBC: 3.2 MIL/uL — ABNORMAL LOW (ref 4.22–5.81)
RDW: 11.7 % (ref 11.5–15.5)
WBC: 1.6 10*3/uL — AB (ref 4.0–10.5)

## 2018-10-13 LAB — CMP (CANCER CENTER ONLY)
ALT: 13 U/L (ref 0–44)
AST: 12 U/L — AB (ref 15–41)
Albumin: 2.8 g/dL — ABNORMAL LOW (ref 3.5–5.0)
Alkaline Phosphatase: 95 U/L (ref 38–126)
Anion gap: 9 (ref 5–15)
BUN: 18 mg/dL (ref 8–23)
CO2: 25 mmol/L (ref 22–32)
CREATININE: 0.87 mg/dL (ref 0.61–1.24)
Calcium: 9.2 mg/dL (ref 8.9–10.3)
Chloride: 99 mmol/L (ref 98–111)
GFR, Est AFR Am: >60 mL/min (ref >60)
GFR, Estimated: >60 mL/min (ref >60)
Glucose, Bld: 105 mg/dL — ABNORMAL HIGH (ref 70–99)
Potassium: 4.1 mmol/L (ref 3.5–5.1)
Sodium: 133 mmol/L — ABNORMAL LOW (ref 135–145)
Total Bilirubin: 0.7 mg/dL (ref 0.3–1.2)
Total Protein: 7 g/dL (ref 6.5–8.1)

## 2018-10-13 LAB — TYPE AND SCREEN
ABO/RH(D): O POS
Antibody Screen: NEGATIVE

## 2018-10-13 MED ORDER — TRAMADOL HCL 50 MG PO TABS: 50.0000 mg | ORAL_TABLET | Freq: Four times a day (QID) | ORAL | Status: DC | PRN

## 2018-10-13 MED ORDER — SODIUM CHLORIDE 0.9% FLUSH: 10.0000 mL | INTRAVENOUS | Status: DC | PRN

## 2018-10-13 MED ORDER — SODIUM CHLORIDE 0.9% FLUSH: 3.0000 mL | INTRAVENOUS | Status: DC | PRN

## 2018-10-13 MED ORDER — SODIUM CHLORIDE 0.9% IV SOLUTION
Freq: Once | INTRAVENOUS | Status: AC
  Administered 2018-10-13: 20:00:00 via INTRAVENOUS

## 2018-10-13 MED ORDER — OMEGA-3-ACID ETHYL ESTERS 1 G PO CAPS
1.0000 g | ORAL_CAPSULE | Freq: Two times a day (BID) | ORAL | Status: DC
  Administered 2018-10-13 – 2018-10-14 (×2): 1 g via ORAL
  Filled 2018-10-13 (×2): qty 1

## 2018-10-13 MED ORDER — PANTOPRAZOLE SODIUM 20 MG PO TBEC: 20.0000 mg | DELAYED_RELEASE_TABLET | ORAL | Status: DC

## 2018-10-13 MED ORDER — DOCUSATE SODIUM 100 MG PO CAPS
100.0000 mg | ORAL_CAPSULE | Freq: Two times a day (BID) | ORAL | Status: DC
  Administered 2018-10-13 – 2018-10-14 (×2): 100 mg via ORAL
  Filled 2018-10-13 (×2): qty 1

## 2018-10-13 MED ORDER — SODIUM CHLORIDE 0.9% FLUSH
3.0000 mL | Freq: Two times a day (BID) | INTRAVENOUS | Status: DC
  Administered 2018-10-13 – 2018-10-14 (×2): 3 mL via INTRAVENOUS

## 2018-10-13 MED ORDER — ACETAMINOPHEN 650 MG RE SUPP: 650.0000 mg | Freq: Four times a day (QID) | RECTAL | Status: DC | PRN

## 2018-10-13 MED ORDER — SENNA 8.6 MG PO TABS
1.0000 | ORAL_TABLET | Freq: Two times a day (BID) | ORAL | Status: DC
  Filled 2018-10-13 (×2): qty 1

## 2018-10-13 MED ORDER — FOLIC ACID 1 MG PO TABS
1.0000 mg | ORAL_TABLET | Freq: Every day | ORAL | Status: DC
  Administered 2018-10-14: 1 mg via ORAL
  Filled 2018-10-13: qty 1

## 2018-10-13 MED ORDER — RAMIPRIL 10 MG PO CAPS
10.0000 mg | ORAL_CAPSULE | Freq: Every day | ORAL | Status: DC
  Administered 2018-10-14: 10 mg via ORAL
  Filled 2018-10-13: qty 1

## 2018-10-13 MED ORDER — ONDANSETRON HCL 4 MG/2ML IJ SOLN: 4.0000 mg | Freq: Four times a day (QID) | INTRAMUSCULAR | Status: DC | PRN

## 2018-10-13 MED ORDER — ACETAMINOPHEN 325 MG PO TABS: 650.0000 mg | ORAL_TABLET | Freq: Four times a day (QID) | ORAL | Status: DC | PRN

## 2018-10-13 MED ORDER — PRAVASTATIN SODIUM 20 MG PO TABS
10.0000 mg | ORAL_TABLET | Freq: Every evening | ORAL | Status: DC
  Administered 2018-10-13: 10 mg via ORAL
  Filled 2018-10-13: qty 1

## 2018-10-13 MED ORDER — ENSURE ENLIVE PO LIQD
237.0000 mL | Freq: Two times a day (BID) | ORAL | Status: DC
  Administered 2018-10-14: 237 mL via ORAL

## 2018-10-13 MED ORDER — TRIAMTERENE-HCTZ 37.5-25 MG PO TABS
0.5000 | ORAL_TABLET | Freq: Every day | ORAL | Status: DC
  Administered 2018-10-14: 0.5 via ORAL
  Filled 2018-10-13: qty 0.5

## 2018-10-13 MED ORDER — SODIUM CHLORIDE 0.9% IV SOLUTION: Freq: Once | INTRAVENOUS | Status: DC

## 2018-10-13 MED ORDER — SODIUM CHLORIDE 0.9 % IV SOLN: 250.0000 mL | INTRAVENOUS | Status: DC | PRN

## 2018-10-13 MED ORDER — METOPROLOL TARTRATE 50 MG PO TABS
50.0000 mg | ORAL_TABLET | Freq: Two times a day (BID) | ORAL | Status: DC
  Administered 2018-10-13 – 2018-10-14 (×2): 50 mg via ORAL
  Filled 2018-10-13 (×2): qty 1

## 2018-10-13 MED ORDER — NIACIN 500 MG PO TABS
500.0000 mg | ORAL_TABLET | Freq: Two times a day (BID) | ORAL | Status: DC
  Administered 2018-10-13 – 2018-10-14 (×2): 500 mg via ORAL
  Filled 2018-10-13 (×2): qty 1

## 2018-10-13 MED ORDER — OMEPRAZOLE MAGNESIUM 20 MG PO TBEC: 10.0000 mg | DELAYED_RELEASE_TABLET | ORAL | Status: DC

## 2018-10-13 MED ORDER — PSYLLIUM 95 % PO PACK
PACK | ORAL | Status: DC
  Administered 2018-10-14: 1 via ORAL
  Filled 2018-10-13: qty 1

## 2018-10-13 MED ORDER — ADULT MULTIVITAMIN W/MINERALS CH
1.0000 | ORAL_TABLET | Freq: Every day | ORAL | Status: DC
  Administered 2018-10-14: 1 via ORAL
  Filled 2018-10-13: qty 1

## 2018-10-13 MED ORDER — SODIUM CHLORIDE 0.9% FLUSH
10.0000 mL | INTRAVENOUS | Status: DC | PRN
  Administered 2018-10-13: 10 mL via INTRAVENOUS
  Filled 2018-10-13: qty 10

## 2018-10-13 MED ORDER — POLYETHYLENE GLYCOL 3350 17 G PO PACK: 17.0000 g | PACK | ORAL | Status: DC

## 2018-10-13 MED ORDER — HEPARIN SOD (PORK) LOCK FLUSH 100 UNIT/ML IV SOLN
500.0000 [IU] | Freq: Once | INTRAVENOUS | Status: AC
  Administered 2018-10-13: 500 [IU] via INTRAVENOUS
  Filled 2018-10-13: qty 5

## 2018-10-13 MED ORDER — ONDANSETRON HCL 4 MG PO TABS: 4.0000 mg | ORAL_TABLET | Freq: Four times a day (QID) | ORAL | Status: DC | PRN

## 2018-10-13 NOTE — H&P (Addendum)
History and Physical    Tony [name] YQM:[id number] DOB: [birth year] DOA: 10/13/2018  PCP: [name], MD   Patient coming from: home    Chief Complaint: fall, thrombocytopenia  HPI: Tony [name] is a 78 y.o. 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  In brief patient was being seen for lab check today at his oncology clinic when he had a mechanical fall while coming out of his car.  He hit his head on the left side.  He did not have any loss of consciousness.  He did not have any chest pain, palpitations, shortness of breath.  He immediately had pain at that site and bleeding.  He was seen at the oncology clinic where he was noted to be thrombocytopenic down to 18.  He was sent to the emergency department for evaluation.  He currently denies any headache.  He denies any chest pain, exertional dyspnea, shortness of breath, orthopnea, lower extreme edema, cough, congestion, fevers, nausea, vomiting, diarrhea.   ED Course: In the emergency department patient's vitals were unremarkable.  Labs are notable for essentially normal electrolytes, platelets of 18, hemoglobin of 10.7, white count of 1.6.  2 units of platelets were ordered.  CT scan showed a large contusion but no evidence of intracranial bleeding.  Review of Systems: As per HPI otherwise 10 point review of systems negative.    Past Medical History:  Diagnosis Date  . Anemia   . Brain metastases ([place])   . CAD (coronary artery disease)   . Constipation   . Gout   . Heart disease   . History of blood transfusion   . History of radiation therapy 09/01/2018   brain, cerebella (5 sites)/ 20 Gy in 1 fraction.  Marland Kitchen HLD  (hyperlipidemia) 10/13/2018  . HTN (hypertension) 10/13/2018  . Hyperlipidemia   . Hypertension   . Lung cancer ([place])   . Mass of lower lobe of right lung   . Myocardial infarction ([place])    MI in 2000  . Peripheral vascular disease ([facility])    Abdominal Aortic Aneurysm  . Prostate cancer [facility]) Nov. 2012  . Ulcer     Past Surgical History:  Procedure Laterality Date  . ANAL FISSURECTOMY    . COLONOSCOPY    . CORONARY ARTERY BYPASS GRAFT     2000  . EYE SURGERY Left Nov. 2013   Cataract  . EYE SURGERY Right Feb. 2014   Cataract  . HERNIA REPAIR    . PORTACATH PLACEMENT Left 09/24/2018   Procedure: INSERTION PORT-A-CATH;  Surgeon: Melrose Nakayama, MD;  Location: [facility];  Service: Thoracic;  Laterality: Left;  . PR VEIN BYPASS GRAFT,AORTO-FEM-POP  2000  . PROSTATE SURGERY  Nov. 2012  . ROBOT ASSISTED LAPAROSCOPIC RADICAL PROSTATECTOMY  08/08/2011   Procedure: ROBOTIC ASSISTED LAPAROSCOPIC RADICAL PROSTATECTOMY LEVEL 2;  Surgeon: Dutch Gray, MD;  Location: [facility];  Service: Urology;  Laterality: Bilateral;  with Bilateral Pelvic Lymphadenectomy   . SHOULDER SURGERY  1980'S  . VIDEO BRONCHOSCOPY WITH ENDOBRONCHIAL ULTRASOUND N/A 08/06/2018   Procedure: VIDEO BRONCHOSCOPY WITH ENDOBRONCHIAL ULTRASOUND;  Surgeon: Melrose Nakayama, MD;  Location: [facility];  Service: Thoracic;  Laterality: N/A;  . WISDOM TOOTH EXTRACTION       reports  that he quit smoking about 20 years ago. His smoking use included cigarettes. He has a 64.50 pack-year smoking history. He has never used smokeless tobacco. He reports current alcohol use of about 7.0 standard drinks of alcohol per week. He reports that he does not use drugs.  No Known Allergies  Family History  Problem Relation Age of Onset  . Diabetes Mother   . Heart disease Mother        CHF  . Heart disease Father   . Heart failure Father        Amputation- Leg  . Deep vein thrombosis Father   . Heart disease Brother        Heart Disease  before age 28  . Heart failure Brother   . Hyperlipidemia Brother   . Hypertension Brother   . Heart disease Sister   . Diabetes Sister   . Heart failure Sister      Prior to Admission medications   Medication Sig Start Date End Date Taking? Authorizing Provider  acetaminophen (TYLENOL) 650 MG CR tablet Take 650 mg by mouth daily as needed for pain.    Yes [provider]  aspirin 81 MG tablet Take 81 mg by mouth every evening.    Yes [provider]  folic acid (FOLVITE) 628 MCG tablet Take 800 mcg by mouth daily.    Yes [provider]  metoprolol tartrate (LOPRESSOR) 50 MG tablet Take 50 mg by mouth 2 (two) times daily.   Yes [provider]  Multiple Vitamins-Minerals (MULTIVITAMIN WITH MINERALS) tablet Take 1 tablet by mouth daily.   Yes [provider]  niacin 500 MG tablet Take 500 mg by mouth 2 (two) times daily.   Yes [provider]  Omega-3 Fatty Acids (FISH OIL) 1200 MG CAPS Take 1,200 mg by mouth daily.    Yes [provider]  omeprazole (PRILOSEC OTC) 20 MG tablet Take 10 mg by mouth 2 (two) times a week.   Yes [provider]  polyethylene glycol (MIRALAX / GLYCOLAX) packet Take 17 g by mouth 4 (four) times a week.    Yes [provider]  pravastatin (PRAVACHOL) 10 MG tablet Take 10 mg by mouth every evening.  07/29/18  Yes [provider]  Psyllium (METAMUCIL FIBER PO) Take 1 Dose by mouth 3 (three) times a week.    Yes [provider]  ramipril (ALTACE) 10 MG capsule Take 10 mg by mouth every morning.    Yes [provider]  triamterene-hydrochlorothiazide (MAXZIDE-25) 37.5-25 MG tablet Take 0.5 tablets by mouth every morning.    Yes [provider]  bisacodyl (DULCOLAX) 10 MG suppository Place 1 suppository (10 mg total) rectally as needed for moderate constipation. Patient not taking: Reported on 10/05/2018 06/13/18   Hayden Rasmussen, MD    lidocaine-prilocaine (EMLA) cream Apply 1 application topically as needed. Patient not taking: Reported on 10/13/2018 08/27/18   Maryanna Shape, NP  LORazepam (ATIVAN) 1 MG tablet Take 1-2 tablets by mouth 15 min prior to brain radiation planning, prior to brain radiation, and prior to future MRI, PRN anxiety. Patient not taking: Reported on 10/13/2018 08/26/18   Eppie Gibson, MD  prochlorperazine (COMPAZINE) 10 MG tablet Take 1 tablet (10 mg total) by mouth every 6 (six) hours as needed for nausea or vomiting. Patient not taking: Reported on 10/13/2018 08/27/18   Maryanna Shape, NP  traMADol (ULTRAM) 50 MG tablet Take 1-2 tablets (50-100 mg total) by mouth every  6 (six) hours as needed for moderate pain. Patient not taking: Reported on 10/13/2018 09/24/18   Melrose Nakayama, MD    Physical Exam: Vitals:   10/13/18 1344  BP: 128/73  Pulse: 72  Resp: 16  Temp: 98 F (36.7 C)  TempSrc: Oral  SpO2: 100%    Constitutional: NAD, calm, comfortable Vitals:   10/13/18 1344  BP: 128/73  Pulse: 72  Resp: 16  Temp: 98 F (36.7 C)  TempSrc: Oral  SpO2: 100%   Eyes: PERRL, extraocular movements intact with no pain with movement ENMT: Mucous membranes, good dentition Neck: normal, supple Respiratory: clear to auscultation bilaterally, no wheezing, no crackles. Normal respiratory effort. No accessory muscle use.  Cardiovascular: Regular rate and rhythm, no murmurs Abdomen: no tenderness, no masses palpated. No hepatosplenomegaly. Bowel sounds positive.  Musculoskeletal: no LE edema Skin: Noted to have periorbital ecchymoses and large contusion over lateral portion, no orbital fracture palpated, site is clean dry and intact Neurologic: CN 2-12 intact. Sensation intact, DTR normal. Strength 5/5 in all 4.  Psychiatric: Normal judgment and insight. Alert and oriented x 3. Normal mood.    Labs on Admission: I have personally reviewed following labs and imaging studies  CBC: Recent  Labs  Lab 10/13/18 1123  WBC 1.6*  NEUTROABS 0.6*  HGB 10.7*  HCT 30.3*  MCV 94.7  PLT 18*   Basic Metabolic Panel: Recent Labs  Lab 10/13/18 1123  NA 133*  K 4.1  CL 99  CO2 25  GLUCOSE 105*  BUN 18  CREATININE 0.87  CALCIUM 9.2   GFR: Estimated Creatinine Clearance: 70 mL/min (by C-G formula based on SCr of 0.87 mg/dL). Liver Function Tests: Recent Labs  Lab 10/13/18 1123  AST 12*  ALT 13  ALKPHOS 95  BILITOT 0.7  PROT 7.0  ALBUMIN 2.8*   No results for input(s): LIPASE, AMYLASE in the last 168 hours. No results for input(s): AMMONIA in the last 168 hours. Coagulation Profile: No results for input(s): INR, PROTIME in the last 168 hours. Cardiac Enzymes: No results for input(s): CKTOTAL, CKMB, CKMBINDEX, TROPONINI in the last 168 hours. BNP (last 3 results) No results for input(s): PROBNP in the last 8760 hours. HbA1C: No results for input(s): HGBA1C in the last 72 hours. CBG: No results for input(s): GLUCAP in the last 168 hours. Lipid Profile: No results for input(s): CHOL, HDL, LDLCALC, TRIG, CHOLHDL, LDLDIRECT in the last 72 hours. Thyroid Function Tests: No results for input(s): TSH, T4TOTAL, FREET4, T3FREE, THYROIDAB in the last 72 hours. Anemia Panel: No results for input(s): VITAMINB12, FOLATE, FERRITIN, TIBC, IRON, RETICCTPCT in the last 72 hours. Urine analysis: No results found for: COLORURINE, APPEARANCEUR, LABSPEC, [place], GLUCOSEU, HGBUR, BILIRUBINUR, KETONESUR, PROTEINUR, UROBILINOGEN, NITRITE, LEUKOCYTESUR  Radiological Exams on Admission: Ct Head Wo Contrast  Result Date: 10/13/2018 CLINICAL DATA:  78 y/o M; fall with ecchymoses to the left orbit and eyelid. EXAM: CT HEAD WITHOUT CONTRAST TECHNIQUE: Contiguous axial images were obtained from the base of the skull through the vertex without intravenous contrast. COMPARISON:  08/22/2018 MRI head. FINDINGS: Brain: No evidence of acute infarction, hemorrhage, hydrocephalus, extra-axial  collection or mass lesion/mass effect. Stable volume loss of the brain. Vascular: Calcific atherosclerosis of the carotid siphons and vertebral arteries. Skull: Left frontal scalp and periorbital contusion. No calvarial fracture. No traumatic finding of the orbital compartment. Sinuses/Orbits: No acute finding. Other: None. IMPRESSION: 1. Left frontal scalp and periorbital contusion. No acute fracture. 2. No acute intracranial abnormality. 3. Stable volume  loss of the brain. Electronically Signed   By: Kristine Garbe M.D.   On: 10/13/2018 15:38    EKG: Independently reviewed. None preformed  Assessment/Plan Active Problems:   Thrombocytopenia ([place])   Bone metastasis ([facility])   Adenocarcinoma, lung, right ([place])   CAD (coronary artery disease)   Hx of CABG   Contusion of head   HTN (hypertension)   HLD (hyperlipidemia)    #) Fall complicated by thrombocytopenia: Thrombocytopenia is almost certainly secondary to chemotherapy.  No evidence of intracranial bleeding.  Patient will need platelet transfusion.  Will monitor patient overnight to make sure his mental status is appropriate.  If his mental status at all deteriorates patient needs stat head CT and evaluation by neurosurgery. -Pending to units packed platelets -Goal platelets greater than 50 - We will recheck after transfusion of platelets -Telemetry  #) Coronary artery disease status post CABG: No evidence of chest pain.  Patient does not have heart failure. -Hold aspirin 81 mg -Continue metoprolol tartrate 50 mg twice daily -Continue ramipril 10 mg every morning -Continue statin per below  #) Hypertension/hyperlipidemia: - Continue beta-blocker and ACE inhibitor per above -continue pravastatin 10 mg nightly -Continue triamterene-HCTZ half tablet every morning  #) Prostate cancer status post resection: Not an active issue  #) Stage IV adenocarcinoma of the lung: Patient is received radiation and completed treatment for  brain metastases.  Patient also has known bony metastases apparently. -Outpatient follow-up -We will consult patient's oncologist, Dr. Earlie Server  Fluids: Tolerating p.o. Electrolytes: Monitor and supplement Nutrition: Heart healthy diet   Prophylaxis: SCDs and thrombocytopenia  Disposition: Pending platelet transfusion and observation  DO NOT RESUSCITATE    Cristy Folks MD [facility]  If 7PM-7AM, please contact night-coverage [email or web address] Password [facility]  10/13/2018, 4:52 PM

## 2018-10-13 NOTE — ED Notes (Signed)
Hospitalist at bedside 

## 2018-10-13 NOTE — ED Notes (Signed)
ED TO INPATIENT HANDOFF REPORT  Name/Age/Gender [name] 78 y.o. male  Code Status Advance Directive Documentation     Most Recent Value  Type of Advance Directive  Healthcare Power of Attorney, Living will  Pre-existing out of facility DNR order (yellow form or pink MOST form)  -  "MOST" Form in Place?  -      Home/SNF/Other Home  Chief Complaint Fall/Head lac  Level of Care/Admitting Diagnosis ED Disposition    ED Disposition Condition [place]: [place] [100102]  Level of Care: Telemetry [5]  Admit to tele based on following criteria: Other see comments  Comments: fall  Diagnosis: Thrombocytopenia [facility]) [[id number]]  Admitting Physician: Cristy Folks [[id number]]  Attending Physician: Cristy Folks [[id number]]  PT Class (Do Not Modify): Observation [104]  PT Acc Code (Do Not Modify): Observation [10022]       Medical History Past Medical History:  Diagnosis Date  . Anemia   . Brain metastases ([place])   . CAD (coronary artery disease)   . Constipation   . Gout   . Heart disease   . History of blood transfusion   . History of radiation therapy 09/01/2018   brain, cerebella (5 sites)/ 20 Gy in 1 fraction.  Marland Kitchen HLD (hyperlipidemia) 10/13/2018  . HTN (hypertension) 10/13/2018  . Hyperlipidemia   . Hypertension   . Lung cancer ([place])   . Mass of lower lobe of right lung   . Myocardial infarction ([place])    MI in 2000  . Peripheral vascular disease (HCC)    Abdominal Aortic Aneurysm  . Prostate cancer [facility]) Nov. 2012  . Ulcer     Allergies No Known Allergies  IV Location/Drains/Wounds Patient Lines/Drains/Airways Status   Active Line/Drains/Airways    Name:   Placement date:   Placement time:   Site:   Days:   Implanted Port 09/24/18 Left Chest   09/24/18    1117    Chest   19   Incision (Closed) 09/24/18 Chest Left   09/24/18    1131     19          Labs/Imaging Results for orders placed or performed  in visit on 10/13/18 (from the past 88 hour(s))  CMP ([place] only)     Status: Abnormal   Collection Time: 10/13/18 11:23 AM  Result Value Ref Range   Sodium 133 (L) 135 - 145 mmol/L   Potassium 4.1 3.5 - 5.1 mmol/L   Chloride 99 98 - 111 mmol/L   CO2 25 22 - 32 mmol/L   Glucose, Bld 105 (H) 70 - 99 mg/dL   BUN 18 8 - 23 mg/dL   Creatinine 0.87 0.61 - 1.24 mg/dL   Calcium 9.2 8.9 - 10.3 mg/dL   Total Protein 7.0 6.5 - 8.1 g/dL   Albumin 2.8 (L) 3.5 - 5.0 g/dL   AST 12 (L) 15 - 41 U/L   ALT 13 0 - 44 U/L   Alkaline Phosphatase 95 38 - 126 U/L   Total Bilirubin 0.7 0.3 - 1.2 mg/dL   GFR, Est Non Af Am >60 >60 mL/min   GFR, Est AFR Am >60 >60 mL/min   Anion gap 9 5 - 15    Comment: Performed at [facility], [address] [place], [place] [address]  CBC with Differential ([place] Only)     Status: Abnormal   Collection Time: 10/13/18 11:23 AM  Result Value Ref Range   WBC Count 1.6 (L) 4.0 - 10.5 K/uL   RBC 3.20 (L) 4.22 - 5.81 MIL/uL   Hemoglobin 10.7 (L) 13.0 - 17.0 g/dL   HCT 30.3 (L) 39.0 - 52.0 %   MCV 94.7 80.0 - 100.0 fL   MCH 33.4 26.0 - 34.0 pg   MCHC 35.3 30.0 - 36.0 g/dL   RDW 11.7 11.5 - 15.5 %   Platelet Count 18 (L) 150 - 400 K/uL    Comment: Immature Platelet Fraction may be clinically indicated, consider ordering this additional test [id number]    nRBC 0.0 0.0 - 0.2 %   Neutrophils Relative % 38 %   Neutro Abs 0.6 (L) 1.7 - 7.7 K/uL   Lymphocytes Relative 37 %   Lymphs Abs 0.6 (L) 0.7 - 4.0 K/uL   Monocytes Relative 16 %   Monocytes Absolute 0.3 0.1 - 1.0 K/uL   Eosinophils Relative 1 %   Eosinophils Absolute 0.0 0.0 - 0.5 K/uL   Basophils Relative 1 %   Basophils Absolute 0.0 0.0 - 0.1 K/uL   Smear Review Atypical lymphs, meta and myelocytes    Immature Granulocytes 7 %   Abs Immature Granulocytes 0.11 (H) 0.00 - 0.07 K/uL    Comment: Performed at [facility], [address]  [place], [place] [address]   Ct Head Wo Contrast  Result Date: 10/13/2018 CLINICAL DATA:  78 y/o M; fall with ecchymoses to the left orbit and eyelid. EXAM: CT HEAD WITHOUT CONTRAST TECHNIQUE: Contiguous axial images were obtained from the base of the skull through the vertex without intravenous contrast. COMPARISON:  08/22/2018 MRI head. FINDINGS: Brain: No evidence of acute infarction, hemorrhage, hydrocephalus, extra-axial collection or mass lesion/mass effect. Stable volume loss of the brain. Vascular: Calcific atherosclerosis of the carotid siphons and vertebral arteries. Skull: Left frontal scalp and periorbital contusion. No calvarial fracture. No traumatic finding of the orbital compartment. Sinuses/Orbits: No acute finding. Other: None. IMPRESSION: 1. Left frontal scalp and periorbital contusion. No acute fracture. 2. No acute intracranial abnormality. 3. Stable volume loss of the brain. Electronically Signed   By: Kristine Garbe M.D.   On: 10/13/2018 15:38   None  Pending Labs Unresulted Labs (From admission, onward)    Start     Ordered   10/13/18 1628  Prepare Pheresed Platelets  (Adult Blood Administration - Platelets (Pheresed))  Once,   R    Question Answer Comment  Number of Apheresis Units 2 units (12-20 packs)   Transfusion Indications PLT Count </=10,000/mm   If emergent release call blood bank Not emergent release      10/13/18 1628   10/13/18 1608  Type and screen [place]  Once,   R    Comments:  [place]    10/13/18 1609   Signed and Held  CBC with Differential/Platelet  Tomorrow morning,   R     Signed and Held          Vitals/Pain Today's Vitals   10/13/18 1332 10/13/18 1344 10/13/18 1500  BP:  128/73 (!) 128/54  Pulse:  72 84  Resp:  16   Temp:  98 F (36.7 C)   TempSrc:  Oral   SpO2:  100% 99%  PainSc: 5       Isolation Precautions No active isolations  Medications Medications  0.9 %  sodium  chloride infusion (Manually program via Guardrails IV Fluids) (has no administration in time  range)  0.9 %  sodium chloride infusion (Manually program via Guardrails IV Fluids) (has no administration in time range)    Mobility walks

## 2018-10-13 NOTE — ED Triage Notes (Signed)
He fell in the parking lot of our [facility], telling us he tripped over a concrete parking barrier. He has right lower leg abrasions x 2 and has an area of edema with ecchymosis at left orbit and eyelid. He is alert, oriented x 4 and ambulatory.

## 2018-10-13 NOTE — ED Notes (Signed)
Bed: WA20 Expected date:  Expected time:  Means of arrival:  Comments: Cancer center-fall

## 2018-10-13 NOTE — ED Provider Notes (Addendum)
[facility] DEPT Provider Note   CSN: [id number] Arrival date & time: 10/13/18  1321     History   Chief Complaint Chief Complaint  Patient presents with  . Fall    HPI Tony [name] is a 78 y.o. male.  The history is provided by the patient. No language interpreter was used.  Fall  This is a new problem. The current episode started 3 to 5 hours ago. The problem occurs constantly. The problem has been gradually worsening. Pertinent negatives include no chest pain, no abdominal pain and no headaches. Nothing aggravates the symptoms. Nothing relieves the symptoms. He has tried nothing for the symptoms. The treatment provided no relief.  Pt reports he tripped on irregular pavement.  Pt hit his head.  Pt reports abrasions on left hand and right lower leg.  Pt is currently receiving chemo for Lung cancer.  Pt had blood work today.  Pt is noted to have platelets of 18  Past Medical History:  Diagnosis Date  . Anemia   . Brain metastases ([place])   . CAD (coronary artery disease)   . Constipation   . Gout   . Heart disease   . History of blood transfusion   . History of radiation therapy 09/01/2018   brain, cerebella (5 sites)/ 20 Gy in 1 fraction.  . Hyperlipidemia   . Hypertension   . Lung cancer ([place])   . Mass of lower lobe of right lung   . Myocardial infarction ([place])    MI in 2000  . Peripheral vascular disease (HCC)    Abdominal Aortic Aneurysm  . Prostate cancer [facility]) Nov. 2012  . Ulcer     Patient Active Problem List   Diagnosis Date Noted  . Encounter for antineoplastic chemotherapy 08/27/2018  . Encounter for antineoplastic immunotherapy 08/27/2018  . Goals of care, counseling/discussion 08/27/2018  . Brain metastases ([place]) 08/26/2018  . Adenocarcinoma, lung, right ([place]) 08/14/2018  . Bone metastasis ([place]) 07/30/2018  . Thrombocytopenia ([place]) 08/10/2012  . 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 Numbness and  tingling 07/28/2012    Past Surgical History:  Procedure Laterality Date  . ANAL FISSURECTOMY    . COLONOSCOPY    . CORONARY ARTERY BYPASS GRAFT     2000  . EYE SURGERY Left Nov. 2013   Cataract  . EYE SURGERY Right Feb. 2014   Cataract  . HERNIA REPAIR    . PORTACATH PLACEMENT Left 09/24/2018   Procedure: INSERTION PORT-A-CATH;  Surgeon: [place] Nakayama, MD;  Location: [facility];  Service: Thoracic;  Laterality: Left;  . PR VEIN BYPASS GRAFT,AORTO-FEM-POP  2000  . PROSTATE SURGERY  Nov. 2012  . ROBOT ASSISTED LAPAROSCOPIC RADICAL PROSTATECTOMY  08/08/2011   Procedure: ROBOTIC ASSISTED LAPAROSCOPIC RADICAL PROSTATECTOMY LEVEL 2;  Surgeon: Dutch Gray, MD;  Location: [facility];  Service: Urology;  Laterality: Bilateral;  with Bilateral Pelvic Lymphadenectomy   . SHOULDER SURGERY  1980'S  . VIDEO BRONCHOSCOPY WITH ENDOBRONCHIAL ULTRASOUND N/A 08/06/2018   Procedure: VIDEO BRONCHOSCOPY WITH ENDOBRONCHIAL ULTRASOUND;  Surgeon: [place] Nakayama, MD;  Location: [facility];  Service: Thoracic;  Laterality: N/A;  . WISDOM TOOTH EXTRACTION          Home Medications    Prior to Admission medications   Medication Sig Start Date End Date Taking? Authorizing Provider  acetaminophen (TYLENOL) 650 MG CR tablet Take 650 mg by mouth every 8 (eight) hours as needed for pain.     [provider]  aspirin 81 MG tablet Take 81 mg by mouth every evening.     [provider]  bisacodyl (DULCOLAX) 10 MG suppository Place 1 suppository (10 mg total) rectally as needed for moderate constipation. Patient not taking: Reported on 10/05/2018 06/13/18   Hayden Rasmussen, MD  colchicine (COLCRYS) 0.6 MG tablet Take 0.6 mg by mouth daily as needed (for gout flare up).     [provider]  folic acid (FOLVITE) 941 MCG tablet Take 800 mcg by mouth daily.     [provider]  lidocaine-prilocaine (EMLA) cream Apply 1 application topically as needed. 08/27/18   Maryanna Shape, NP    LORazepam (ATIVAN) 1 MG tablet Take 1-2 tablets by mouth 15 min prior to brain radiation planning, prior to brain radiation, and prior to future MRI, PRN anxiety. 08/26/18   Eppie Gibson, MD  metoprolol tartrate (LOPRESSOR) 50 MG tablet Take 50 mg by mouth 2 (two) times daily.    [provider]  Multiple Vitamins-Minerals (MULTIVITAMIN WITH MINERALS) tablet Take 1 tablet by mouth daily.    [provider]  niacin 500 MG tablet Take 500 mg by mouth 2 (two) times daily.    [provider]  Omega-3 Fatty Acids (FISH OIL) 1200 MG CAPS Take 1,200 mg by mouth daily.     [provider]  omeprazole (PRILOSEC OTC) 20 MG tablet Take 10 mg by mouth 2 (two) times a week.    [provider]  polyethylene glycol (MIRALAX / GLYCOLAX) packet Take 17 g by mouth 4 (four) times a week.     [provider]  pravastatin (PRAVACHOL) 10 MG tablet Take 10 mg by mouth every evening.  07/29/18   [provider]  prochlorperazine (COMPAZINE) 10 MG tablet Take 1 tablet (10 mg total) by mouth every 6 (six) hours as needed for nausea or vomiting. 08/27/18   Maryanna Shape, NP  Psyllium (METAMUCIL FIBER PO) Take 1 Dose by mouth 3 (three) times a week.     [provider]  ramipril (ALTACE) 10 MG capsule Take 10 mg by mouth every morning.     [provider]  traMADol (ULTRAM) 50 MG tablet Take 1-2 tablets (50-100 mg total) by mouth every 6 (six) hours as needed for moderate pain. Patient not taking: Reported on 10/05/2018 09/24/18   [place] Nakayama, MD  triamterene-hydrochlorothiazide (MAXZIDE-25) 37.5-25 MG tablet Take 0.5 tablets by mouth every morning.     [provider]    Family History Family History  Problem Relation Age of Onset  . Diabetes Mother   . Heart disease Mother        CHF  . Heart disease Father   . Heart failure Father        Amputation- Leg  . Deep vein thrombosis Father   . Heart disease Brother         Heart Disease before age 2  . Heart failure Brother   . Hyperlipidemia Brother   . Hypertension Brother   . Heart disease Sister   . Diabetes Sister   . Heart failure Sister     Social History Social History   Tobacco Use  . Smoking status: Former Smoker    Packs/day: 1.50    Years: 43.00    Pack years: 64.50    Types: Cigarettes    Last attempt to quit: 09/23/1998    Years since quitting: 20.0  . Smokeless tobacco: Never Used  Substance Use  Topics  . Alcohol use: Yes    Alcohol/week: 7.0 standard drinks    Types: 7 Glasses of wine per week    Comment: half glass per day  . Drug use: No     Allergies   Patient has no known allergies.   Review of Systems Review of Systems  Cardiovascular: Negative for chest pain.  Gastrointestinal: Negative for abdominal pain.  Neurological: Negative for headaches.  All other systems reviewed and are negative.    Physical Exam Updated Vital Signs BP 128/73 (BP Location: Left Arm)   Pulse 72   Temp 98 F (36.7 C) (Oral)   Resp 16   SpO2 100%   Physical Exam Vitals signs and nursing note reviewed.  Constitutional:      Appearance: He is well-developed.     Comments: Bruised area aabove left eye   HENT:     Head: Normocephalic and atraumatic.     Right Ear: Tympanic membrane normal.     Left Ear: Tympanic membrane normal.     Nose: Nose normal.     Mouth/Throat:     Mouth: Mucous membranes are moist.  Eyes:     Conjunctiva/sclera: Conjunctivae normal.  Neck:     Musculoskeletal: Neck supple.  Cardiovascular:     Rate and Rhythm: Normal rate and regular rhythm.     Heart sounds: No murmur.  Pulmonary:     Effort: Pulmonary effort is normal. No respiratory distress.     Breath sounds: Normal breath sounds.  Abdominal:     Palpations: Abdomen is soft.     Tenderness: There is no abdominal tenderness.  Musculoskeletal:     Comments: Abrasions right lower leg, no bleeding.  superficial abrasions left hand    Skin:    General: Skin is warm and dry.  Neurological:     General: No focal deficit present.     Mental Status: He is alert.      ED Treatments / Results  Labs (all labs ordered are listed, but only abnormal results are displayed) Labs Reviewed - No data to display  EKG None  Radiology No results found.  Procedures .Critical Care Performed by: Fransico Meadow, PA-C Authorized by: Fransico Meadow, PA-C   Critical care provider statement:    Critical care time (minutes):  45   Critical care start time:  11/02/2018 2:30 PM   Critical care end time:  10/13/2018 5:02 PM   Critical care was necessary to treat or prevent imminent or life-threatening deterioration of the following conditions:  Dehydration and circulatory failure   Critical care was time spent personally by me on the following activities:  Blood draw for specimens, development of treatment plan with patient or surrogate, discussions with consultants, discussions with primary provider, evaluation of patient's response to treatment, examination of patient, interpretation of cardiac output measurements, obtaining history from patient or surrogate, review of old charts, ordering and performing treatments and interventions and ordering and review of laboratory studies   (including critical care time)  Medications Ordered in ED Medications - No data to display   Initial Impression / Assessment and Plan / ED Course  I have reviewed the triage vital signs and the nursing notes.  Pertinent labs & imaging results that were available during my care of the patient were reviewed by me and considered in my medical decision making (see chart for details).     MDM  Ct head no acute abnormality.  Pt has platelets of 18.  I spoke with Hospitalist who will see,   Transfusion ordered.  Oncology consulted I spoke with Sandi Mealy who advised tranfuse 2 units of platelets,  Will observe for bleeding post fall   Pt and wife  counseled on results  Final Clinical Impressions(s) / ED Diagnoses   Final diagnoses:  Contusion of face, initial encounter  Thrombocytopenia [facility])    ED Discharge Orders    None       Sidney Ace 10/13/18 1616    Sidney Ace 10/13/18 1630    Pattricia Boss, MD 10/14/18 1427    Fransico Meadow, PA-C 11/02/18 1702    Pattricia Boss, MD 11/03/18 1146

## 2018-10-13 NOTE — ED Notes (Signed)
I have just phoned report to the nurse on [address]--will transport now.

## 2018-10-14 ENCOUNTER — Other Ambulatory Visit: Payer: Self-pay

## 2018-10-14 ENCOUNTER — Observation Stay (HOSPITAL_COMMUNITY): Payer: Medicare Other

## 2018-10-14 DIAGNOSIS — D696 Thrombocytopenia, unspecified: Secondary | ICD-10-CM | POA: Diagnosis not present

## 2018-10-14 LAB — CBC WITH DIFFERENTIAL/PLATELET
Abs Immature Granulocytes: 0 10*3/uL (ref 0.00–0.07)
Band Neutrophils: 2 %
Basophils Absolute: 0 10*3/uL (ref 0.0–0.1)
Basophils Relative: 0 %
Eosinophils Absolute: 0 10*3/uL (ref 0.0–0.5)
Eosinophils Relative: 0 %
HCT: 26.8 % — ABNORMAL LOW (ref 39.0–52.0)
Hemoglobin: 9 g/dL — ABNORMAL LOW (ref 13.0–17.0)
Lymphocytes Relative: 27 %
Lymphs Abs: 0.3 K/uL — ABNORMAL LOW (ref 0.7–4.0)
MCH: 33.6 pg (ref 26.0–34.0)
MCHC: 33.6 g/dL (ref 30.0–36.0)
MCV: 100 fL (ref 80.0–100.0)
Monocytes Absolute: 0.2 10*3/uL (ref 0.1–1.0)
Monocytes Relative: 17 %
Neutro Abs: 0.7 10*3/uL — ABNORMAL LOW (ref 1.7–7.7)
Neutrophils Relative %: 54 %
Platelets: 58 10*3/uL — ABNORMAL LOW (ref 150–400)
RBC: 2.68 MIL/uL — ABNORMAL LOW (ref 4.22–5.81)
RDW: 11.7 % (ref 11.5–15.5)
WBC: 1.2 10*3/uL — CL (ref 4.0–10.5)
nRBC: 0 % (ref 0.0–0.2)

## 2018-10-14 LAB — BPAM PLATELET PHERESIS
Blood Product Expiration Date: 202001232359
Blood Product Expiration Date: 202001232359
ISSUE DATE / TIME: 202001211951
ISSUE DATE / TIME: 202001212139
Unit Type and Rh: 5100
Unit Type and Rh: 5100

## 2018-10-14 LAB — PREPARE PLATELET PHERESIS
Unit division: 0
Unit division: 0

## 2018-10-14 MED ORDER — HEPARIN SOD (PORK) LOCK FLUSH 100 UNIT/ML IV SOLN
500.0000 [IU] | INTRAVENOUS | Status: AC | PRN
  Administered 2018-10-14: 500 [IU]

## 2018-10-14 NOTE — Discharge Summary (Addendum)
Physician Discharge Summary  Tony [name] ZOX:[id number] DOB: 1940/12/09 DOA: 10/13/2018  PCP: [name], MD  Admit date: 10/13/2018 Discharge date: 10/14/2018  Admitted From: Home Disposition:  Home  Recommendations for Outpatient Follow-up:  1. Follow up with PCP in 1-2 weeks 2. Please obtain BMP/CBC with diff in one week 3. Please hold your aspirin until your platelets are increased over 100,000.   Home Health: No Equipment/Devices:None  Discharge Condition: stable CODE STATUS: FULL Diet recommendation: Heart Healthy  Brief/Interim Summary:  #) Fall complicated by thrombocytopenia: Patient was admitted after mechanical fall.  He had no evidence of syncope/palpitations.  He was noted to be thrombocytopenic secondary to chemotherapy.  Platelets initially were 18.  He was given 2 units of platelets.  His initial head CT showed evidence of a contusion noted on the left side of his eye and head but no evidence of intracranial bleeding.  Repeat head CT was negative.  Patient's platelets increased to 58.  Patient's mental status and neurological exam is baseline.  #) Coronary artery disease status post CABG: Patient did not have any evidence of chest pain or heart failure.  Patient was continued on metoprolol, ramipril, statin.  His aspirin was held as he was thrombocytopenic.  He should restart his aspirin once his platelets increase over 100,000.  #) Hypertension/hyperlipidemia: Patient was continued on beta-blocker, ACE inhibitor, statin, triamterene/HCTZ.  #) Prostate cancer status post resection: Not an active issue  #) Stage IV adenocarcinoma of the lung: Patient is completed radiation treatment.  He will follow-up as an outpatient with his oncologist.   Discharge Diagnoses:  Active Problems:   Thrombocytopenia ([place])   Bone metastasis ([facility])   Adenocarcinoma, lung, right ([place])   CAD (coronary artery disease)   Hx of CABG   Contusion of head   HTN (hypertension)  HLD (hyperlipidemia)    Discharge Instructions   Allergies as of 10/14/2018   No Known Allergies     Medication List    TAKE these medications   acetaminophen 650 MG CR tablet Commonly known as:  TYLENOL Take 650 mg by mouth daily as needed for pain.   aspirin 81 MG tablet Take 81 mg by mouth every evening.   Fish Oil 1200 MG Caps Take 1,200 mg by mouth daily.   folic acid 811 MCG tablet Commonly known as:  FOLVITE Take 800 mcg by mouth daily.   METAMUCIL FIBER PO Take 1 Dose by mouth 3 (three) times a week.   metoprolol tartrate 50 MG tablet Commonly known as:  LOPRESSOR Take 50 mg by mouth 2 (two) times daily.   multivitamin with minerals tablet Take 1 tablet by mouth daily.   niacin 500 MG tablet Take 500 mg by mouth 2 (two) times daily.   omeprazole 20 MG tablet Commonly known as:  PRILOSEC OTC Take 10 mg by mouth 2 (two) times a week.   polyethylene glycol packet Commonly known as:  MIRALAX / GLYCOLAX Take 17 g by mouth 4 (four) times a week.   pravastatin 10 MG tablet Commonly known as:  PRAVACHOL Take 10 mg by mouth every evening.   ramipril 10 MG capsule Commonly known as:  ALTACE Take 10 mg by mouth every morning.   triamterene-hydrochlorothiazide 37.5-25 MG tablet Commonly known as:  MAXZIDE-25 Take 0.5 tablets by mouth every morning.       No Known Allergies  Consultations:  None   Procedures/Studies: Dg Chest 2 View  Result Date: 09/24/2018 CLINICAL DATA:  78 year old male with  chest malignancy, preop chest x-ray EXAM: CHEST - 2 VIEW COMPARISON:  08/06/2018, 08/04/2018, PET-CT 07/27/2018 FINDINGS: Cardiomediastinal silhouette unchanged in size and contour. Surgical changes of median sternotomy and CABG. No pneumothorax. No pleural effusion. No new confluent airspace disease. Lateral view demonstrates opacity at the posterior lung base overlying the anterior aspect of the thoracic spine. Osteopenia IMPRESSION: Chronic lung changes  without evidence of acute cardiopulmonary disease. Opacity on the lateral view corresponds to known malignancy of the right lower lobe. Surgical changes of prior median sternotomy and CABG Electronically Signed   By: Corrie Mckusick D.O.   On: 09/24/2018 10:05   Ct Head Wo Contrast  Result Date: 10/13/2018 CLINICAL DATA:  78 y/o M; fall with ecchymoses to the left orbit and eyelid. EXAM: CT HEAD WITHOUT CONTRAST TECHNIQUE: Contiguous axial images were obtained from the base of the skull through the vertex without intravenous contrast. COMPARISON:  08/22/2018 MRI head. FINDINGS: Brain: No evidence of acute infarction, hemorrhage, hydrocephalus, extra-axial collection or mass lesion/mass effect. Stable volume loss of the brain. Vascular: Calcific atherosclerosis of the carotid siphons and vertebral arteries. Skull: Left frontal scalp and periorbital contusion. No calvarial fracture. No traumatic finding of the orbital compartment. Sinuses/Orbits: No acute finding. Other: None. IMPRESSION: 1. Left frontal scalp and periorbital contusion. No acute fracture. 2. No acute intracranial abnormality. 3. Stable volume loss of the brain. Electronically Signed   By: Kristine Garbe M.D.   On: 10/13/2018 15:38   Dg Fluoro Guide Cv Line-no Report  Result Date: 09/24/2018 Fluoroscopy was utilized by the requesting physician.  No radiographic interpretation.     Subjective:   Discharge Exam: Vitals:   10/14/18 0501 10/14/18 0628  BP: (!) 106/57   Pulse: 83   Resp: 16   Temp: 100 F (37.8 C) 97.9 F (36.6 C)  SpO2: 97%    Vitals:   10/13/18 2206 10/13/18 2321 10/14/18 0501 10/14/18 0628  BP: (!) 119/58 115/62 (!) 106/57   Pulse: 88 84 83   Resp: 20 18 16    Temp: 99.1 F (37.3 C) 98.6 F (37 C) 100 F (37.8 C) 97.9 F (36.6 C)  TempSrc: Oral Oral Oral Oral  SpO2: 99% 98% 97%   Weight:      Height:       Eyes: PERRL, extraocular movements intact with no pain with movement ENMT: Mucous  membranes, good dentition Neck: normal, supple Respiratory: clear to auscultation bilaterally, no wheezing, no crackles. Normal respiratory effort. No accessory muscle use.  Cardiovascular: Regular rate and rhythm, no murmurs Abdomen: no tenderness, no masses palpated. No hepatosplenomegaly. Bowel sounds positive.  Musculoskeletal: no LE edema Skin: Noted to have periorbital ecchymoses and large contusion over lateral portion, no orbital fracture palpated, site is clean dry and intact Neurologic: CN 2-12 intact. Sensation intact, DTR normal. Strength 5/5 in all 4.  Psychiatric: Normal judgment and insight. Alert and oriented x 3. Normal mood.    The results of significant diagnostics from this hospitalization (including imaging, microbiology, ancillary and laboratory) are listed below for reference.     Microbiology: No results found for this or any previous visit (from the past 240 hour(s)).   Labs: BNP (last 3 results) No results for input(s): BNP in the last 8760 hours. Basic Metabolic Panel: Recent Labs  Lab 10/13/18 1123  NA 133*  K 4.1  CL 99  CO2 25  GLUCOSE 105*  BUN 18  CREATININE 0.87  CALCIUM 9.2   Liver Function Tests: Recent Labs  Lab  10/13/18 1123  AST 12*  ALT 13  ALKPHOS 95  BILITOT 0.7  PROT 7.0  ALBUMIN 2.8*   No results for input(s): LIPASE, AMYLASE in the last 168 hours. No results for input(s): AMMONIA in the last 168 hours. CBC: Recent Labs  Lab 10/13/18 1123 10/14/18 0448  WBC 1.6* 1.2*  NEUTROABS 0.6* 0.7*  HGB 10.7* 9.0*  HCT 30.3* 26.8*  MCV 94.7 100.0  PLT 18* 58*   Cardiac Enzymes: No results for input(s): CKTOTAL, CKMB, CKMBINDEX, TROPONINI in the last 168 hours. BNP: Invalid input(s): POCBNP CBG: No results for input(s): GLUCAP in the last 168 hours. D-Dimer No results for input(s): DDIMER in the last 72 hours. Hgb A1c No results for input(s): HGBA1C in the last 72 hours. Lipid Profile No results for input(s): CHOL,  HDL, LDLCALC, TRIG, CHOLHDL, LDLDIRECT in the last 72 hours. Thyroid function studies No results for input(s): TSH, T4TOTAL, T3FREE, THYROIDAB in the last 72 hours.  Invalid input(s): FREET3 Anemia work up No results for input(s): VITAMINB12, FOLATE, FERRITIN, TIBC, IRON, RETICCTPCT in the last 72 hours. Urinalysis No results found for: COLORURINE, APPEARANCEUR, LABSPEC, [place], GLUCOSEU, HGBUR, BILIRUBINUR, KETONESUR, PROTEINUR, UROBILINOGEN, NITRITE, LEUKOCYTESUR Sepsis Labs Invalid input(s): PROCALCITONIN,  WBC,  LACTICIDVEN Microbiology No results found for this or any previous visit (from the past 240 hour(s)).   Time coordinating discharge:35  SIGNED:   Cristy Folks, MD  [facility] 10/14/2018, 8:29 AM  If 7PM-7AM, please contact night-coverage [email or web address] Password [email or web address]

## 2018-10-14 NOTE — Discharge Instructions (Signed)
Contusion    A contusion is a deep bruise. Contusions are the result of a blunt injury to tissues and muscle fibers under the skin. The injury causes bleeding under the skin. The skin overlying the contusion may turn blue, purple, or yellow. Minor injuries will give you a painless contusion, but more severe contusions may stay painful and swollen for a few weeks.  What are the causes?  This condition is usually caused by a blow, trauma, or direct force to an area of the body.  What are the signs or symptoms?  Symptoms of this condition include:   Swelling of the injured area.   Pain and tenderness in the injured area.   Discoloration. The area may have redness and then turn blue, purple, or yellow.  How is this diagnosed?  This condition is diagnosed based on a physical exam and medical history. An X-ray, CT scan, or MRI may be needed to determine if there are any associated injuries, such as broken bones (fractures).  How is this treated?  Specific treatment for this condition depends on what area of the body was injured. In general, the best treatment for a contusion is resting, icing, applying pressure to (compression), and elevating the injured area. This is often called the RICE strategy. Over-the-counter anti-inflammatory medicines may also be recommended for pain control.  Follow these instructions at home:   Rest the injured area.   If directed, apply ice to the injured area:  ? Put ice in a plastic bag.  ? Place a towel between your skin and the bag.  ? Leave the ice on for 20 minutes, 2-3 times per day.   If directed, apply light compression to the injured area using an elastic bandage. Make sure the bandage is not wrapped too tightly. Remove and reapply the bandage as directed by your health care provider.   If possible, raise (elevate) the injured area above the level of your heart while you are sitting or lying down.   Take over-the-counter and prescription medicines only as told by your health  care provider.  Contact a health care provider if:   Your symptoms do not improve after several days of treatment.   Your symptoms get worse.   You have difficulty moving the injured area.  Get help right away if:   You have severe pain.   You have numbness in a hand or foot.   Your hand or foot turns pale or cold.  This information is not intended to replace advice given to you by your health care provider. Make sure you discuss any questions you have with your health care provider.  Document Released: 06/19/2005 Document Revised: 01/18/2016 Document Reviewed: 01/25/2015  [employer]vier Interactive Patient Education  2019 [employer]

## 2018-10-14 NOTE — Progress Notes (Signed)
Nutrition Brief Note  Patient identified on the Malnutrition Screening Tool (MST) Report  Wt Readings from Last 15 Encounters:  10/13/18 78.7 kg  10/05/18 80.3 kg  10/05/18 80.3 kg  09/29/18 80.6 kg  09/24/18 81.3 kg  09/17/18 81.3 kg  08/27/18 79.2 kg  08/25/18 79.7 kg  08/17/18 79.2 kg  08/13/18 79 kg  08/06/18 80.3 kg  08/04/18 80.6 kg  07/30/18 80.4 kg  07/30/18 80.4 kg  07/30/18 80.4 kg    Body mass index is 25.61 kg/m. Patient meets criteria for overweight based on current BMI. Current weight is 173 lb and weight on 08/13/18 was 174 lb indicating 1 lb weight loss in 2 months. Skin WDL.  Patient admitted for thrombocytopenia. Discharge order and summary for d/c home entered ~40 minutes ago.  Current diet order is Heart Healthy and patient iconsumed 100% of breakfast this AM. Labs and medications reviewed.   Ensure Enlive was ordered BID per ONS protocol; to start today and patient has not yet had a bottle of Ensure. No nutrition interventions warranted at this time. If nutrition issues arise, please consult RD.     Tony Matin, MS, RD, LDN, [facility] Inpatient Clinical Dietitian Pager # [phone] After hours/weekend pager # [phone]

## 2018-10-19 NOTE — Progress Notes (Signed)
[facility] Progress Note   Tony [name] [id number] 05/11/1941 78 y.o.  Tony [name] is managed by Tony [name]. Tony [name]  Actively treated with chemotherapy/immunotherapy/hormonal therapy: yes  Current Therapy: Carboplatin, Keytruda, and Alimta  Last Treated: 09/30/2018 (cycle 1, day 1)  Assessment: Plan:    Adenocarcinoma, lung, right (HCC)  Blunt force injury  Thrombocytopenia ([place])   Metastatic adenocarcinoma of the lung: Patient continues to be managed by Dr. Julien [name].  He is status post cycle 1, day 1 of carboplatin, Keytruda, and Alimta which was dosed on 09/30/2018.    Blunt force trauma to the right ankle, left hand and left forehead: The patient was transported to the emergency room for evaluation and management.  Thrombocytopenia: A CBC returned today with a platelet count of 18,000.  The patient will likely be transfused with platelets in the emergency room today.  Please see After Visit Summary for patient specific instructions.  Future Appointments  Date Time Provider [place]  10/20/2018  9:15 AM [facility] LAB 1 [facility] None  10/20/2018  9:30 AM [facility] [place] FLUSH [facility] None  10/20/2018 10:00 AM Chenae Brager, Lucianne Lei E., PA-C [facility] None  10/20/2018 11:15 AM [facility] INFUSION [facility] None  10/27/2018 11:15 AM [facility] LAB 2 [facility] None  10/27/2018 11:30 AM [facility] [place] FLUSH [facility] None  11/03/2018 11:15 AM [facility] LAB 2 [facility] None  11/03/2018 11:30 AM [facility] [place] FLUSH [facility] None  11/10/2018 10:45 AM [facility] LAB 2 [facility] None  11/10/2018 11:00 AM [facility] [place] FLUSH [facility] None  11/10/2018 11:30 AM Curt Bears, MD [facility] None  11/10/2018 12:30 PM [facility] INFUSION [facility] None  04/07/2019  9:15 AM Hayden Pedro, MD [facility] None    No orders of the defined types were placed in this encounter.      Subjective:   Patient ID:  Tony [name] is a 78 y.o. (DOB  1941-02-18) male.  Chief Complaint:  Chief Complaint  Patient presents with  . Fall    HPI Tony [name] is a 78 year old male with a history of a metastatic adenocarcinoma of the lung who is managed by Dr. Julien [name].  Patient continues to be managed by Dr. Julien [name].  He is status post cycle 1, day 1 of carboplatin, Keytruda, and Alimta which was dosed on 09/30/2018.  He presented to the office today for blood work.  He tripped and fell while stepping over the curb in the parking area.  He has an abrasion on his right ankle and left hand.  He also has bleeding and swelling in his left forehead which he had during his fall.  The patient's labs returned showing a WBC of 1.6, hemoglobin 10.7, hematocrit 30.3, and platelet count of 18,000.  The patient denies syncope during this episode.  He had no loss of consciousness.  He has no weakness.  He is alert and oriented.  Medications: I have reviewed the patient's current medications.  Allergies: No Known Allergies  Past Medical History:  Diagnosis Date  . Anemia   . Brain metastases ([place])   . CAD (coronary artery disease)   . Constipation   . Gout   . Heart disease   . History of blood transfusion   . History of radiation therapy 09/01/2018   brain, cerebella (5 sites)/ 20 Gy in 1 fraction.  Marland Kitchen HLD (hyperlipidemia) 10/13/2018  . HTN (hypertension) 10/13/2018  . Hyperlipidemia   . Hypertension   . Lung cancer ([place])   . Mass of lower lobe of  right lung   . Myocardial infarction ([place])    MI in 2000  . Peripheral vascular disease (HCC)    Abdominal Aortic Aneurysm  . Prostate cancer [facility]) Nov. 2012  . Ulcer     Past Surgical History:  Procedure Laterality Date  . ANAL FISSURECTOMY    . COLONOSCOPY    . CORONARY ARTERY BYPASS GRAFT     2000  . EYE SURGERY Left Nov. 2013   Cataract  . EYE SURGERY Right Feb. 2014   Cataract  . HERNIA REPAIR    . PORTACATH PLACEMENT Left 09/24/2018   Procedure: INSERTION PORT-A-CATH;  Surgeon:  Melrose Nakayama, MD;  Location: [place];  Service: Thoracic;  Laterality: Left;  . PR VEIN BYPASS GRAFT,AORTO-FEM-POP  2000  . PROSTATE SURGERY  Nov. 2012  . ROBOT ASSISTED LAPAROSCOPIC RADICAL PROSTATECTOMY  08/08/2011   Procedure: ROBOTIC ASSISTED LAPAROSCOPIC RADICAL PROSTATECTOMY LEVEL 2;  Surgeon: Dutch Gray, MD;  Location: [facility];  Service: Urology;  Laterality: Bilateral;  with Bilateral Pelvic Lymphadenectomy   . SHOULDER SURGERY  1980'S  . VIDEO BRONCHOSCOPY WITH ENDOBRONCHIAL ULTRASOUND N/A 08/06/2018   Procedure: VIDEO BRONCHOSCOPY WITH ENDOBRONCHIAL ULTRASOUND;  Surgeon: Melrose Nakayama, MD;  Location[facility];  Service: Thoracic;  Laterality: N/A;  . WISDOM TOOTH EXTRACTION      Family History  Problem Relation Age of Onset  . Diabetes Mother   . Heart disease Mother        CHF  . Heart disease Father   . Heart failure Father        Amputation- Leg  . Deep vein thrombosis Father   . Heart disease Brother        Heart Disease before age 81  . Heart failure Brother   . Hyperlipidemia Brother   . Hypertension Brother   . Heart disease Sister   . Diabetes Sister   . Heart failure Sister     Social History   Socioeconomic History  . Marital status: Married    Spouse name: Not on file  . Number of children: Not on file  . Years of education: Not on file  . Highest education level: Not on file  Occupational History  . Not on file  Social Needs  . Financial resource strain: Not on file  . Food insecurity:    Worry: Not on file    Inability: Not on file  . Transportation needs:    Medical: Not on file    Non-medical: Not on file  Tobacco Use  . Smoking status: Former Smoker    Packs/day: 1.50    Years: 43.00    Pack years: 64.50    Types: Cigarettes    Last attempt to quit: 09/23/1998    Years since quitting: 20.0  . Smokeless tobacco: Never Used  Substance and Sexual Activity  . Alcohol use: Yes    Alcohol/week: 7.0 standard drinks    Types: 7  Glasses of wine per week    Comment: half glass per day  . Drug use: No  . Sexual activity: Not on file  Lifestyle  . Physical activity:    Days per week: Not on file    Minutes per session: Not on file  . Stress: Not on file  Relationships  . Social connections:    Talks on phone: Not on file    Gets together: Not on file    Attends religious service: Not on file    Active member of club or  organization: Not on file    Attends meetings of clubs or organizations: Not on file    Relationship status: Not on file  . Intimate partner violence:    Fear of current or ex partner: Not on file    Emotionally abused: Not on file    Physically abused: Not on file    Forced sexual activity: Not on file  Other Topics Concern  . Not on file  Social History Narrative  . Not on file    Past Medical History, Surgical history, Social history, and Family history were reviewed and updated as appropriate.   Please see review of systems for further details on the patient's review from today.   Review of Systems:  Review of Systems  HENT: Positive for facial swelling. Negative for voice change.   Respiratory: Negative for shortness of breath.   Cardiovascular: Negative for chest pain.  Skin:       Abrasions of the right ankle and left hand.  Bleeding, bruising, swelling, and abrasion of the left forehead.  Neurological: Negative for dizziness, seizures, syncope, speech difficulty, weakness and headaches.  Psychiatric/Behavioral: Negative for behavioral problems and confusion.    Objective:   Physical Exam:  BP (!) 147/75 (BP Location: Left Arm)   Pulse 77   Temp (!) 97.1 F (36.2 C) (Oral)   Resp 18   SpO2 100%  ECOG: 1  Physical Exam Constitutional:      General: He is not in acute distress.    Appearance: He is not diaphoretic.  HENT:     Head: Normocephalic. Contusion present.     Comments: Bleeding, bruising, swelling, and abrasion of the left forehead.    Right Ear:  Tympanic membrane and ear canal normal. No hemotympanum. Tympanic membrane is not perforated or erythematous.     Left Ear: Tympanic membrane and ear canal normal. No hemotympanum. Tympanic membrane is not perforated or erythematous.  Cardiovascular:     Rate and Rhythm: Normal rate and regular rhythm.     Heart sounds: Normal heart sounds. No murmur. No friction rub. No gallop.   Pulmonary:     Effort: Pulmonary effort is normal. No respiratory distress.     Breath sounds: Normal breath sounds. No wheezing or rales.  Skin:    General: Skin is warm and dry.     Findings: No erythema or rash.     Comments: Abrasions of the right ankle and left hand.  Neurological:     Mental Status: He is alert.     Lab Review:     Component Value Date/Time   NA 133 (L) 10/13/2018 1123   NA 139 08/10/2012 1328   K 4.1 10/13/2018 1123   K 4.1 08/10/2012 1328   CL 99 10/13/2018 1123   CL 105 08/10/2012 1328   CO2 25 10/13/2018 1123   CO2 29 08/10/2012 1328   GLUCOSE 105 (H) 10/13/2018 1123   GLUCOSE 112 (H) 08/10/2012 1328   BUN 18 10/13/2018 1123   BUN 18.0 08/10/2012 1328   CREATININE 0.87 10/13/2018 1123   CREATININE 1.1 08/10/2012 1328   CALCIUM 9.2 10/13/2018 1123   CALCIUM 9.0 08/10/2012 1328   PROT 7.0 10/13/2018 1123   PROT 6.1 (L) 08/10/2012 1328   ALBUMIN 2.8 (L) 10/13/2018 1123   ALBUMIN 3.2 (L) 08/10/2012 1328   AST 12 (L) 10/13/2018 1123   AST 42 (H) 08/10/2012 1328   ALT 13 10/13/2018 1123   ALT 55 08/10/2012 1328   ALKPHOS 95  10/13/2018 1123   ALKPHOS 144 08/10/2012 1328   BILITOT 0.7 10/13/2018 1123   BILITOT 0.91 08/10/2012 1328   GFRNONAA >60 10/13/2018 1123   GFRAA >60 10/13/2018 1123       Component Value Date/Time   WBC 1.2 (LL) 10/14/2018 0448   RBC 2.68 (L) 10/14/2018 0448   HGB 9.0 (L) 10/14/2018 0448   HGB 10.7 (L) 10/13/2018 1123   HGB 14.7 05/11/2013 1235   HCT 26.8 (L) 10/14/2018 0448   HCT 42.5 05/11/2013 1235   PLT 58 (L) 10/14/2018 0448   PLT  18 (L) 10/13/2018 1123   PLT 131 (L) 05/11/2013 1235   MCV 100.0 10/14/2018 0448   MCV 100.7 (H) 05/11/2013 1235   MCH 33.6 10/14/2018 0448   MCHC 33.6 10/14/2018 0448   RDW 11.7 10/14/2018 0448   RDW 12.8 05/11/2013 1235   LYMPHSABS 0.3 (L) 10/14/2018 0448   LYMPHSABS 2.1 05/11/2013 1235   MONOABS 0.2 10/14/2018 0448   MONOABS 0.5 05/11/2013 1235   EOSABS 0.0 10/14/2018 0448   EOSABS 0.1 05/11/2013 1235   BASOSABS 0.0 10/14/2018 0448   BASOSABS 0.1 05/11/2013 1235   -------------------------------  Imaging from last 24 hours (if applicable):  Radiology interpretation: Dg Chest 2 View  Result Date: 09/24/2018 CLINICAL DATA:  78 year old male with chest malignancy, preop chest x-ray EXAM: CHEST - 2 VIEW COMPARISON:  08/06/2018, 08/04/2018, PET-CT 07/27/2018 FINDINGS: Cardiomediastinal silhouette unchanged in size and contour. Surgical changes of median sternotomy and CABG. No pneumothorax. No pleural effusion. No new confluent airspace disease. Lateral view demonstrates opacity at the posterior lung base overlying the anterior aspect of the thoracic spine. Osteopenia IMPRESSION: Chronic lung changes without evidence of acute cardiopulmonary disease. Opacity on the lateral view corresponds to known malignancy of the right lower lobe. Surgical changes of prior median sternotomy and CABG Electronically Signed   By: Corrie Mckusick D.O.   On: 09/24/2018 10:05   Ct Head Wo Contrast  Result Date: 10/14/2018 CLINICAL DATA:  Cerebral hemorrhage suspected. New diagnosis of lung cancer with fall yesterday. EXAM: CT HEAD WITHOUT CONTRAST TECHNIQUE: Contiguous axial images were obtained from the base of the skull through the vertex without intravenous contrast. COMPARISON:  Head CT 10/13/2018 FINDINGS: Brain: No evidence of acute infarction, hemorrhage, hydrocephalus, extra-axial collection or mass lesion/mass effect. There is history of intracranial metastatic disease that is not visible by noncontrast  head CT. Vascular: Atherosclerotic calcification Skull: Known left C1 lateral mass metastasis. There is asymmetric lateral atlanto dental interval with widening on the right, stable from prior brain MRI. Left forehead hematoma without underlying fracture. Sinuses/Orbits: No evidence of postseptal injury. Bilateral cataract resection. IMPRESSION: 1. Stable from yesterday.  No evidence of intracranial injury. 2. Scalp hematoma without acute fracture. 3. Known C1 left lateral mass metastasis. Electronically Signed   By: Monte Fantasia M.D.   On: 10/14/2018 08:35   Ct Head Wo Contrast  Result Date: 10/13/2018 CLINICAL DATA:  78 y/o M; fall with ecchymoses to the left orbit and eyelid. EXAM: CT HEAD WITHOUT CONTRAST TECHNIQUE: Contiguous axial images were obtained from the base of the skull through the vertex without intravenous contrast. COMPARISON:  08/22/2018 MRI head. FINDINGS: Brain: No evidence of acute infarction, hemorrhage, hydrocephalus, extra-axial collection or mass lesion/mass effect. Stable volume loss of the brain. Vascular: Calcific atherosclerosis of the carotid siphons and vertebral arteries. Skull: Left frontal scalp and periorbital contusion. No calvarial fracture. No traumatic finding of the orbital compartment. Sinuses/Orbits: No acute finding. Other: None. IMPRESSION:  1. Left frontal scalp and periorbital contusion. No acute fracture. 2. No acute intracranial abnormality. 3. Stable volume loss of the brain. Electronically Signed   By: Kristine Garbe M.D.   On: 10/13/2018 15:38   Dg Fluoro Guide Cv Line-no Report  Result Date: 09/24/2018 Fluoroscopy was utilized by the requesting physician.  No radiographic interpretation.        This case was discussed with Dr. Julien [name]. He expressed agreement with my management of this patient.

## 2018-10-20 ENCOUNTER — Inpatient Hospital Stay: Payer: Medicare Other

## 2018-10-20 ENCOUNTER — Telehealth: Payer: Self-pay

## 2018-10-20 ENCOUNTER — Other Ambulatory Visit: Payer: Self-pay | Admitting: Radiation Therapy

## 2018-10-20 ENCOUNTER — Inpatient Hospital Stay (HOSPITAL_BASED_OUTPATIENT_CLINIC_OR_DEPARTMENT_OTHER): Payer: Medicare Other | Admitting: Medical

## 2018-10-20 VITALS — BP 150/63 | HR 59 | Temp 98.0°F | Resp 18 | Ht 69.0 in | Wt 171.5 lb

## 2018-10-20 DIAGNOSIS — Z5112 Encounter for antineoplastic immunotherapy: Secondary | ICD-10-CM

## 2018-10-20 DIAGNOSIS — C7951 Secondary malignant neoplasm of bone: Secondary | ICD-10-CM | POA: Diagnosis not present

## 2018-10-20 DIAGNOSIS — C3431 Malignant neoplasm of lower lobe, right bronchus or lung: Secondary | ICD-10-CM

## 2018-10-20 DIAGNOSIS — C7931 Secondary malignant neoplasm of brain: Secondary | ICD-10-CM

## 2018-10-20 DIAGNOSIS — I2510 Atherosclerotic heart disease of native coronary artery without angina pectoris: Secondary | ICD-10-CM

## 2018-10-20 DIAGNOSIS — C7949 Secondary malignant neoplasm of other parts of nervous system: Principal | ICD-10-CM

## 2018-10-20 DIAGNOSIS — Z95828 Presence of other vascular implants and grafts: Secondary | ICD-10-CM

## 2018-10-20 DIAGNOSIS — Z87891 Personal history of nicotine dependence: Secondary | ICD-10-CM

## 2018-10-20 DIAGNOSIS — D696 Thrombocytopenia, unspecified: Secondary | ICD-10-CM

## 2018-10-20 DIAGNOSIS — C3491 Malignant neoplasm of unspecified part of right bronchus or lung: Secondary | ICD-10-CM

## 2018-10-20 DIAGNOSIS — I10 Essential (primary) hypertension: Secondary | ICD-10-CM

## 2018-10-20 LAB — CMP (CANCER CENTER ONLY)
ALBUMIN: 3 g/dL — AB (ref 3.5–5.0)
ALT: 32 U/L (ref 0–44)
AST: 29 U/L (ref 15–41)
Alkaline Phosphatase: 110 U/L (ref 38–126)
Anion gap: 11 (ref 5–15)
BUN: 13 mg/dL (ref 8–23)
CHLORIDE: 100 mmol/L (ref 98–111)
CO2: 25 mmol/L (ref 22–32)
Calcium: 9.5 mg/dL (ref 8.9–10.3)
Creatinine: 0.97 mg/dL (ref 0.61–1.24)
GFR, Est AFR Am: >60 mL/min (ref >60)
GFR, Estimated: >60 mL/min (ref >60)
Glucose, Bld: 132 mg/dL — ABNORMAL HIGH (ref 70–99)
Potassium: 3.8 mmol/L (ref 3.5–5.1)
Sodium: 136 mmol/L (ref 135–145)
Total Bilirubin: 0.3 mg/dL (ref 0.3–1.2)
Total Protein: 7.3 g/dL (ref 6.5–8.1)

## 2018-10-20 LAB — CBC WITH DIFFERENTIAL (CANCER CENTER ONLY)
Abs Immature Granulocytes: 0.37 10*3/uL — ABNORMAL HIGH (ref 0.00–0.07)
Basophils Absolute: 0 10*3/uL (ref 0.0–0.1)
Basophils Relative: 1 %
Eosinophils Absolute: 0 10*3/uL (ref 0.0–0.5)
Eosinophils Relative: 0 %
HCT: 30.8 % — ABNORMAL LOW (ref 39.0–52.0)
Hemoglobin: 10.6 g/dL — ABNORMAL LOW (ref 13.0–17.0)
Immature Granulocytes: 7 %
LYMPHS PCT: 22 %
Lymphs Abs: 1.2 10*3/uL (ref 0.7–4.0)
MCH: 32.9 pg (ref 26.0–34.0)
MCHC: 34.4 g/dL (ref 30.0–36.0)
MCV: 95.7 fL (ref 80.0–100.0)
Monocytes Absolute: 0.6 10*3/uL (ref 0.1–1.0)
Monocytes Relative: 11 %
Neutro Abs: 3.3 10*3/uL (ref 1.7–7.7)
Neutrophils Relative %: 59 %
Platelet Count: 189 10*3/uL (ref 150–400)
RBC: 3.22 MIL/uL — ABNORMAL LOW (ref 4.22–5.81)
RDW: 11.9 % (ref 11.5–15.5)
WBC Count: 5.5 10*3/uL (ref 4.0–10.5)
nRBC: 0 % (ref 0.0–0.2)

## 2018-10-20 LAB — TSH: TSH: 0.745 u[IU]/mL (ref 0.320–4.118)

## 2018-10-20 MED ORDER — SODIUM CHLORIDE 0.9% FLUSH
10.0000 mL | INTRAVENOUS | Status: DC | PRN
  Administered 2018-10-20: 10 mL
  Filled 2018-10-20: qty 10

## 2018-10-20 MED ORDER — PALONOSETRON HCL INJECTION 0.25 MG/5ML
INTRAVENOUS | Status: AC
  Filled 2018-10-20: qty 5

## 2018-10-20 MED ORDER — SODIUM CHLORIDE 0.9 % IV SOLN
Freq: Once | INTRAVENOUS | Status: AC
  Administered 2018-10-20: 11:00:00 via INTRAVENOUS
  Filled 2018-10-20: qty 250

## 2018-10-20 MED ORDER — SODIUM CHLORIDE 0.9% FLUSH
10.0000 mL | Freq: Once | INTRAVENOUS | Status: AC
  Administered 2018-10-20: 10 mL
  Filled 2018-10-20: qty 10

## 2018-10-20 MED ORDER — SODIUM CHLORIDE 0.9 % IV SOLN
200.0000 mg | Freq: Once | INTRAVENOUS | Status: AC
  Administered 2018-10-20: 200 mg via INTRAVENOUS
  Filled 2018-10-20: qty 8

## 2018-10-20 MED ORDER — SODIUM CHLORIDE 0.9 % IV SOLN
Freq: Once | INTRAVENOUS | Status: AC
  Administered 2018-10-20: 12:00:00 via INTRAVENOUS
  Filled 2018-10-20: qty 5

## 2018-10-20 MED ORDER — SODIUM CHLORIDE 0.9 % IV SOLN
408.0000 mg/m2 | Freq: Once | INTRAVENOUS | Status: AC
  Administered 2018-10-20: 800 mg via INTRAVENOUS
  Filled 2018-10-20: qty 20

## 2018-10-20 MED ORDER — PALONOSETRON HCL INJECTION 0.25 MG/5ML
0.2500 mg | Freq: Once | INTRAVENOUS | Status: AC
  Administered 2018-10-20: 0.25 mg via INTRAVENOUS

## 2018-10-20 MED ORDER — SODIUM CHLORIDE 0.9 % IV SOLN
370.0000 mg | Freq: Once | INTRAVENOUS | Status: AC
  Administered 2018-10-20: 370 mg via INTRAVENOUS
  Filled 2018-10-20: qty 37

## 2018-10-20 MED ORDER — HEPARIN SOD (PORK) LOCK FLUSH 100 UNIT/ML IV SOLN
500.0000 [IU] | Freq: Once | INTRAVENOUS | Status: AC | PRN
  Administered 2018-10-20: 500 [IU]
  Filled 2018-10-20: qty 5

## 2018-10-20 NOTE — Progress Notes (Signed)
Spoke w/ Dr. Julien Nordmann today. Given patients low platelet count and ANC following C1, he would to reduce carboplatin dose to AUC = 4 and alimta dose to 400 mg/m2. Dose reductions are to carry forward to subsequent treatments. Orders updated.  Demetrius Charity, PharmD, [facility] Phone: [phone] 10/20/2018

## 2018-10-20 NOTE — Patient Instructions (Signed)

## 2018-10-20 NOTE — Telephone Encounter (Signed)
Printed avs and calender of upcoming appointment. Per 1/28 los 

## 2018-10-20 NOTE — Progress Notes (Signed)
Ok to treat today per [employer] and [place] Lucianne Lei.  Pt ambulatory with spouse and belongings to infusion.

## 2018-10-20 NOTE — Patient Instructions (Signed)
[facility] Discharge Instructions for Patients Receiving Chemotherapy  Today you received the following chemotherapy agents Keytruda, Alimta, Carboplatin  To help prevent nausea and vomiting after your treatment, we encourage you to take your nausea medication as prescribed.   If you develop nausea and vomiting that is not controlled by your nausea medication, call the clinic.   BELOW ARE SYMPTOMS THAT SHOULD BE REPORTED IMMEDIATELY:  *FEVER GREATER THAN 100.5 F  *CHILLS WITH OR WITHOUT FEVER  NAUSEA AND VOMITING THAT IS NOT CONTROLLED WITH YOUR NAUSEA MEDICATION  *UNUSUAL SHORTNESS OF BREATH  *UNUSUAL BRUISING OR BLEEDING  TENDERNESS IN MOUTH AND THROAT WITH OR WITHOUT PRESENCE OF ULCERS  *URINARY PROBLEMS  *BOWEL PROBLEMS  UNUSUAL RASH Items with * indicate a potential emergency and should be followed up as soon as possible.  Feel free to call the clinic should you have any questions or concerns. The clinic phone number is [phone].  Please show the [facility] at check-in to the Emergency Department and triage nurse.  Pembrolizumab injection Beryle Flock) What is this medicine? PEMBROLIZUMAB (pem broe liz ue mab) is a monoclonal antibody. It is used to treat cervical cancer, esophageal cancer, head and neck cancer, hepatocellular cancer, Hodgkin lymphoma, kidney cancer, lymphoma, melanoma, Merkel cell carcinoma, lung cancer, stomach cancer, urothelial cancer, and cancers that have a certain genetic condition. This medicine may be used for other purposes; ask your health care provider or pharmacist if you have questions. COMMON BRAND NAME(S): Keytruda What should I tell my health care provider before I take this medicine? They need to know if you have any of these conditions: -diabetes -immune system problems -inflammatory bowel disease -liver disease -lung or breathing disease -lupus -received or scheduled to receive an organ transplant or a  stem-cell transplant that uses donor stem cells -an unusual or allergic reaction to pembrolizumab, other medicines, foods, dyes, or preservatives -pregnant or trying to get pregnant -breast-feeding How should I use this medicine? This medicine is for infusion into a vein. It is given by a health care professional in a hospital or clinic setting. A special MedGuide will be given to you before each treatment. Be sure to read this information carefully each time. Talk to your pediatrician regarding the use of this medicine in children. While this drug may be prescribed for selected conditions, precautions do apply. Overdosage: If you think you have taken too much of this medicine contact a poison control center or emergency room at once. NOTE: This medicine is only for you. Do not share this medicine with others. What if I miss a dose? It is important not to miss your dose. Call your doctor or health care professional if you are unable to keep an appointment. What may interact with this medicine? Interactions have not been studied. Give your health care provider a list of all the medicines, herbs, non-prescription drugs, or dietary supplements you use. Also tell them if you smoke, drink alcohol, or use illegal drugs. Some items may interact with your medicine. This list may not describe all possible interactions. Give your health care provider a list of all the medicines, herbs, non-prescription drugs, or dietary supplements you use. Also tell them if you smoke, drink alcohol, or use illegal drugs. Some items may interact with your medicine. What should I watch for while using this medicine? Your condition will be monitored carefully while you are receiving this medicine. You may need blood work done while you are taking this medicine. Do not  become pregnant while taking this medicine or for 4 months after stopping it. Women should inform their doctor if they wish to become pregnant or think they  might be pregnant. There is a potential for serious side effects to an unborn child. Talk to your health care professional or pharmacist for more information. Do not breast-feed an infant while taking this medicine or for 4 months after the last dose. What side effects may I notice from receiving this medicine? Side effects that you should report to your doctor or health care professional as soon as possible: -allergic reactions like skin rash, itching or hives, swelling of the face, lips, or tongue -bloody or black, tarry -breathing problems -changes in vision -chest pain -chills -confusion -constipation -cough -diarrhea -dizziness or feeling faint or lightheaded -fast or irregular heartbeat -fever -flushing -hair loss -joint pain -low blood counts - this medicine may decrease the number of white blood cells, red blood cells and platelets. You may be at increased risk for infections and bleeding. -muscle pain -muscle weakness -persistent headache -redness, blistering, peeling or loosening of the skin, including inside the mouth -signs and symptoms of high blood sugar such as dizziness; dry mouth; dry skin; fruity breath; nausea; stomach pain; increased hunger or thirst; increased urination -signs and symptoms of kidney injury like trouble passing urine or change in the amount of urine -signs and symptoms of liver injury like dark urine, light-colored stools, loss of appetite, nausea, right upper belly pain, yellowing of the eyes or skin -sweating -swollen lymph nodes -weight loss Side effects that usually do not require medical attention (report to your doctor or health care professional if they continue or are bothersome): -decreased appetite -muscle pain -tiredness This list may not describe all possible side effects. Call your doctor for medical advice about side effects. You may report side effects to FDA at 1-800-FDA-1088. Where should I keep my medicine? This drug is given  in a hospital or clinic and will not be stored at home. NOTE: This sheet is a summary. It may not cover all possible information. If you have questions about this medicine, talk to your doctor, pharmacist, or health care provider.  2019 [employer] Standard (2018-04-23 15:06:10) Pemetrexed injection (Alimta) What is this medicine? PEMETREXED (PEM e TREX ed) is a chemotherapy drug used to treat lung cancers like non-small cell lung cancer and mesothelioma. It may also be used to treat other cancers. This medicine may be used for other purposes; ask your health care provider or pharmacist if you have questions. COMMON BRAND NAME(S): Alimta What should I tell my health care provider before I take this medicine? They need to know if you have any of these conditions: -infection (especially a virus infection such as chickenpox, cold sores, or herpes) -kidney disease -low blood counts, like low white cell, platelet, or red cell counts -lung or breathing disease, like asthma -radiation therapy -an unusual or allergic reaction to pemetrexed, other medicines, foods, dyes, or preservative -pregnant or trying to get pregnant -breast-feeding How should I use this medicine? This drug is given as an infusion into a vein. It is administered in a hospital or clinic by a specially trained health care professional. Talk to your pediatrician regarding the use of this medicine in children. Special care may be needed. Overdosage: If you think you have taken too much of this medicine contact a poison control center or emergency room at once. NOTE: This medicine is only for you. Do not share this medicine with others.  What if I miss a dose? It is important not to miss your dose. Call your doctor or health care professional if you are unable to keep an appointment. What may interact with this medicine? This medicine may interact with the following medications: -Ibuprofen This list may not describe all possible  interactions. Give your health care provider a list of all the medicines, herbs, non-prescription drugs, or dietary supplements you use. Also tell them if you smoke, drink alcohol, or use illegal drugs. Some items may interact with your medicine. What should I watch for while using this medicine? Visit your doctor for checks on your progress. This drug may make you feel generally unwell. This is not uncommon, as chemotherapy can affect healthy cells as well as cancer cells. Report any side effects. Continue your course of treatment even though you feel ill unless your doctor tells you to stop. In some cases, you may be given additional medicines to help with side effects. Follow all directions for their use. Call your doctor or health care professional for advice if you get a fever, chills or sore throat, or other symptoms of a cold or flu. Do not treat yourself. This drug decreases your body's ability to fight infections. Try to avoid being around people who are sick. This medicine may increase your risk to bruise or bleed. Call your doctor or health care professional if you notice any unusual bleeding. Be careful brushing and flossing your teeth or using a toothpick because you may get an infection or bleed more easily. If you have any dental work done, tell your dentist you are receiving this medicine. Avoid taking products that contain aspirin, acetaminophen, ibuprofen, naproxen, or ketoprofen unless instructed by your doctor. These medicines may hide a fever. Call your doctor or health care professional if you get diarrhea or mouth sores. Do not treat yourself. To protect your kidneys, drink water or other fluids as directed while you are taking this medicine. Do not become pregnant while taking this medicine or for 6 months after stopping it. Women should inform their doctor if they wish to become pregnant or think they might be pregnant. Men should not father a child while taking this medicine and  for 3 months after stopping it. This may interfere with the ability to father a child. You should talk to your doctor or health care professional if you are concerned about your fertility. There is a potential for serious side effects to an unborn child. Talk to your health care professional or pharmacist for more information. Do not breast-feed an infant while taking this medicine or for 1 week after stopping it. What side effects may I notice from receiving this medicine? Side effects that you should report to your doctor or health care professional as soon as possible: -allergic reactions like skin rash, itching or hives, swelling of the face, lips, or tongue -breathing problems -redness, blistering, peeling or loosening of the skin, including inside the mouth -signs and symptoms of bleeding such as bloody or black, tarry stools; red or dark-brown urine; spitting up blood or brown material that looks like coffee grounds; red spots on the skin; unusual bruising or bleeding from the eye, gums, or nose -signs and symptoms of infection like fever or chills; cough; sore throat; pain or trouble passing urine -signs and symptoms of kidney injury like trouble passing urine or change in the amount of urine -signs and symptoms of liver injury like dark yellow or brown urine; general ill feeling or  flu-like symptoms; light-colored stools; loss of appetite; nausea; right upper belly pain; unusually weak or tired; yellowing of the eyes or skin Side effects that usually do not require medical attention (report to your doctor or health care professional if they continue or are bothersome): -constipation -mouth sores -nausea, vomiting -unusually weak or tired This list may not describe all possible side effects. Call your doctor for medical advice about side effects. You may report side effects to FDA at 1-800-FDA-1088. Where should I keep my medicine? This drug is given in a hospital or clinic and will not be  stored at home. NOTE: This sheet is a summary. It may not cover all possible information. If you have questions about this medicine, talk to your doctor, pharmacist, or health care provider.  2019 [employer]/Gold Standard (2017-10-29 16:11:33)  Carboplatin injection What is this medicine? CARBOPLATIN (KAR boe pla tin) is a chemotherapy drug. It targets fast dividing cells, like cancer cells, and causes these cells to die. This medicine is used to treat ovarian cancer and many other cancers. This medicine may be used for other purposes; ask your health care provider or pharmacist if you have questions. COMMON BRAND NAME(S): Paraplatin What should I tell my health care provider before I take this medicine? They need to know if you have any of these conditions: -blood disorders -hearing problems -kidney disease -recent or ongoing radiation therapy -an unusual or allergic reaction to carboplatin, cisplatin, other chemotherapy, other medicines, foods, dyes, or preservatives -pregnant or trying to get pregnant -breast-feeding How should I use this medicine? This drug is usually given as an infusion into a vein. It is administered in a hospital or clinic by a specially trained health care professional. Talk to your pediatrician regarding the use of this medicine in children. Special care may be needed. Overdosage: If you think you have taken too much of this medicine contact a poison control center or emergency room at once. NOTE: This medicine is only for you. Do not share this medicine with others. What if I miss a dose? It is important not to miss a dose. Call your doctor or health care professional if you are unable to keep an appointment. What may interact with this medicine? -medicines for seizures -medicines to increase blood counts like filgrastim, pegfilgrastim, sargramostim -some antibiotics like amikacin, gentamicin, neomycin, streptomycin, tobramycin -vaccines Talk to your doctor or  health care professional before taking any of these medicines: -acetaminophen -aspirin -ibuprofen -ketoprofen -naproxen This list may not describe all possible interactions. Give your health care provider a list of all the medicines, herbs, non-prescription drugs, or dietary supplements you use. Also tell them if you smoke, drink alcohol, or use illegal drugs. Some items may interact with your medicine. What should I watch for while using this medicine? Your condition will be monitored carefully while you are receiving this medicine. You will need important blood work done while you are taking this medicine. This drug may make you feel generally unwell. This is not uncommon, as chemotherapy can affect healthy cells as well as cancer cells. Report any side effects. Continue your course of treatment even though you feel ill unless your doctor tells you to stop. In some cases, you may be given additional medicines to help with side effects. Follow all directions for their use. Call your doctor or health care professional for advice if you get a fever, chills or sore throat, or other symptoms of a cold or flu. Do not treat yourself. This drug decreases  your body's ability to fight infections. Try to avoid being around people who are sick. This medicine may increase your risk to bruise or bleed. Call your doctor or health care professional if you notice any unusual bleeding. Be careful brushing and flossing your teeth or using a toothpick because you may get an infection or bleed more easily. If you have any dental work done, tell your dentist you are receiving this medicine. Avoid taking products that contain aspirin, acetaminophen, ibuprofen, naproxen, or ketoprofen unless instructed by your doctor. These medicines may hide a fever. Do not become pregnant while taking this medicine. Women should inform their doctor if they wish to become pregnant or think they might be pregnant. There is a potential for  serious side effects to an unborn child. Talk to your health care professional or pharmacist for more information. Do not breast-feed an infant while taking this medicine. What side effects may I notice from receiving this medicine? Side effects that you should report to your doctor or health care professional as soon as possible: -allergic reactions like skin rash, itching or hives, swelling of the face, lips, or tongue -signs of infection - fever or chills, cough, sore throat, pain or difficulty passing urine -signs of decreased platelets or bleeding - bruising, pinpoint red spots on the skin, black, tarry stools, nosebleeds -signs of decreased red blood cells - unusually weak or tired, fainting spells, lightheadedness -breathing problems -changes in hearing -changes in vision -chest pain -high blood pressure -low blood counts - This drug may decrease the number of white blood cells, red blood cells and platelets. You may be at increased risk for infections and bleeding. -nausea and vomiting -pain, swelling, redness or irritation at the injection site -pain, tingling, numbness in the hands or feet -problems with balance, talking, walking -trouble passing urine or change in the amount of urine Side effects that usually do not require medical attention (report to your doctor or health care professional if they continue or are bothersome): -hair loss -loss of appetite -metallic taste in the mouth or changes in taste This list may not describe all possible side effects. Call your doctor for medical advice about side effects. You may report side effects to FDA at 1-800-FDA-1088. Where should I keep my medicine? This drug is given in a hospital or clinic and will not be stored at home. NOTE: This sheet is a summary. It may not cover all possible information. If you have questions about this medicine, talk to your doctor, pharmacist, or health care provider.  2019 [employer]/Gold Standard  (2007-12-15 14:38:05)

## 2018-10-21 ENCOUNTER — Ambulatory Visit: Payer: Medicare Other | Admitting: Medical

## 2018-10-21 ENCOUNTER — Ambulatory Visit: Payer: Medicare Other

## 2018-10-21 ENCOUNTER — Other Ambulatory Visit: Payer: Medicare Other

## 2018-10-23 NOTE — Progress Notes (Signed)
[facility] Progress Note   Tony [name] [id number] [birth year] 78 y.o.  Tony [name] is managed by Dr. Fanny Bien. [name]  Actively treated with chemotherapy/immunotherapy/hormonal therapy: yes  Current Therapy: Carboplatin, Keytruda, and Alimta  Last Treated: 09/30/2018 (cycle 1, day 1)  Assessment: Plan:    Bone metastasis ([place])  Brain metastases ([place])  Adenocarcinoma, lung, right ([place]) - Plan: DISCONTINUED: sodium chloride flush (NS) 0.9 % injection 10 mL, DISCONTINUED: heparin lock flush 100 unit/mL, DISCONTINUED: 0.9 %  sodium chloride infusion, DISCONTINUED: palonosetron (ALOXI) injection 0.25 mg, DISCONTINUED: fosaprepitant (EMEND) 150 mg, dexamethasone (DECADRON) 12 mg in sodium chloride 0.9 % 145 mL IVPB, DISCONTINUED: pembrolizumab (KEYTRUDA) 200 mg in sodium chloride 0.9 % 50 mL chemo infusion, DISCONTINUED: PEMEtrexed (ALIMTA) 975 mg in sodium chloride 0.9 % 100 mL chemo infusion, DISCONTINUED: CARBOplatin (PARAPLATIN) 470 mg in sodium chloride 0.9 % 250 mL chemo infusion  Thrombocytopenia (HCC)   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  The patient is managed by Dr. Julien Nordmann.  He is status post cycle 1, day 1 of carboplatin, Keytruda, and Alimta which was dosed on 09/30/2018.  He was scheduled to receive cycle 1, day 8 of therapy last week but had his treatment held after he fell and had blunt force trauma to his left ankle, left hand, and left forehead.  He was sent to the emergency room for evaluation and management.  We will proceed with his treatment today.  He will return for labs and follow-up on 10/27/2018.  Thrombocytopenia: Patient's platelet count was 18,000 last week when he was sent to the emergency room for evaluation.  He was transfused with 2 units of platelets.  A CBC returned today with a platelet count of 189,000.  It i looks so excited to look so excited with the brain hemogram like like he has undergone a  brain hematology ultrasound patient's for his sister s recommended the patient continue to hold his low-dose aspirin until he has seen on 10/27/2018.  Please see After Visit Summary for patient specific instructions.  Future Appointments  Date Time Provider [place]  10/27/2018 10:15 AM [facility] 1 [facility] None  10/27/2018 10:30 AM [facility] [place] FLUSH [facility] None  10/27/2018 11:00 AM Curcio, Roselie Awkward, NP [facility] None  11/03/2018 11:15 AM [facility] LAB 2 [facility] None  11/03/2018 11:30 AM [facility] [place] FLUSH [facility] None  11/10/2018 10:45 AM [facility] LAB 2 [facility] None  11/10/2018 11:00 AM [facility] [place] FLUSH [facility] None  11/10/2018 11:30 AM Curt Bears, MD [facility] None  11/10/2018 12:30 PM [facility] INFUSION [facility] None  12/03/2018 12:00 PM [facility] 1 [facility]  04/07/2019  9:15 AM Hayden Pedro, MD [facility] None    No orders of the defined types were placed in this encounter.      Subjective:   Patient ID:  Tony [name] is a 78 y.o. (DOB [birth year]) male.  Chief Complaint:  Chief Complaint  Patient presents with  . Follow-up    HPI Tony [name] is a 78 year old male with a history of a metastatic adenocarcinoma of the lung with brain and bone metastasis.  The patient was last seen 1 week ago when he presented for consideration of cycle 1, day 8 of carboplatin, Keytruda, and Alimta.  This was held after the patient tripped and fell in the parking area.  He had an abrasion of his right ankle and left hand.  He also had a contusion and bleeding of the  left forehead.  He had no loss of consciousness.  A CBC returned showing a platelet count of 18,000.  He was transported to the emergency room for evaluation and management.  A head CT showed no intracranial bleed.  He was given 2 units of platelets.  He denies dizziness, disorientation, chest pain, shortness of breath, nausea, vomiting, diarrhea, constipation, fevers, chills, or  sweats.  He has bruising of his left forehead and face with no active bleeding.  His low-dose aspirin was stopped last week.  His wife asked when this should be restarted.  Medications: I have reviewed the patient's current medications.  Allergies: No Known Allergies  Past Medical History:  Diagnosis Date  . Anemia   . Brain metastases ([place])   . CAD (coronary artery disease)   . Constipation   . Gout   . Heart disease   . History of blood transfusion   . History of radiation therapy 09/01/2018   brain, cerebella (5 sites)/ 20 Gy in 1 fraction.  Marland Kitchen HLD (hyperlipidemia) 10/13/2018  . HTN (hypertension) 10/13/2018  . Hyperlipidemia   . Hypertension   . Lung cancer ([place])   . Mass of lower lobe of right lung   . Myocardial infarction ([place])    MI in 2000  . Peripheral vascular disease (HCC)    Abdominal Aortic Aneurysm  . Prostate cancer [facility]) Nov. 2012  . Ulcer     Past Surgical History:  Procedure Laterality Date  . ANAL FISSURECTOMY    . COLONOSCOPY    . CORONARY ARTERY BYPASS GRAFT     2000  . EYE SURGERY Left Nov. 2013   Cataract  . EYE SURGERY Right Feb. 2014   Cataract  . HERNIA REPAIR    . PORTACATH PLACEMENT Left 09/24/2018   Procedure: INSERTION PORT-A-CATH;  Surgeon: Melrose Nakayama, MD;  Location: [facility];  Service: Thoracic;  Laterality: Left;  . PR VEIN BYPASS GRAFT,AORTO-FEM-POP  2000  . PROSTATE SURGERY  Nov. 2012  . ROBOT ASSISTED LAPAROSCOPIC RADICAL PROSTATECTOMY  08/08/2011   Procedure: ROBOTIC ASSISTED LAPAROSCOPIC RADICAL PROSTATECTOMY LEVEL 2;  Surgeon: Dutch Gray, MD;  Location: [facility];  Service: Urology;  Laterality: Bilateral;  with Bilateral Pelvic Lymphadenectomy   . SHOULDER SURGERY  1980'S  . VIDEO BRONCHOSCOPY WITH ENDOBRONCHIAL ULTRASOUND N/A 08/06/2018   Procedure: VIDEO BRONCHOSCOPY WITH ENDOBRONCHIAL ULTRASOUND;  Surgeon: Melrose Nakayama, MD;  Location: [facility] OR;  Service: Thoracic;  Laterality: N/A;  . WISDOM TOOTH EXTRACTION       Family History  Problem Relation Age of Onset  . Diabetes Mother   . Heart disease Mother        CHF  . Heart disease Father   . Heart failure Father        Amputation- Leg  . Tony vein thrombosis Father   . Heart disease Brother        Heart Disease before age 4  . Heart failure Brother   . Hyperlipidemia Brother   . Hypertension Brother   . Heart disease Sister   . Diabetes Sister   . Heart failure Sister     Social History   Socioeconomic History  . Marital status: Married    Spouse name: Not on file  . Number of children: Not on file  . Years of education: Not on file  . Highest education level: Not on file  Occupational History  . Not on file  Social Needs  . Financial resource strain: Not on file  .  Food insecurity:    Worry: Not on file    Inability: Not on file  . Transportation needs:    Medical: Not on file    Non-medical: Not on file  Tobacco Use  . Smoking status: Former Smoker    Packs/day: 1.50    Years: 43.00    Pack years: 64.50    Types: Cigarettes    Last attempt to quit: 09/23/1998    Years since quitting: 20.0  . Smokeless tobacco: Never Used  Substance and Sexual Activity  . Alcohol use: Yes    Alcohol/week: 7.0 standard drinks    Types: 7 Glasses of wine per week    Comment: half glass per day  . Drug use: No  . Sexual activity: Not on file  Lifestyle  . Physical activity:    Days per week: Not on file    Minutes per session: Not on file  . Stress: Not on file  Relationships  . Social connections:    Talks on phone: Not on file    Gets together: Not on file    Attends religious service: Not on file    Active member of club or organization: Not on file    Attends meetings of clubs or organizations: Not on file    Relationship status: Not on file  . Intimate partner violence:    Fear of current or ex partner: Not on file    Emotionally abused: Not on file    Physically abused: Not on file    Forced sexual activity: Not on  file  Other Topics Concern  . Not on file  Social History Narrative  . Not on file    Past Medical History, Surgical history, Social history, and Family history were reviewed and updated as appropriate.   Please see review of systems for further details on the patient's review from today.   Review of Systems:  Review of Systems  Constitutional: Negative for chills, diaphoresis and fever.  HENT: Negative for trouble swallowing and voice change.   Respiratory: Negative for cough, chest tightness, shortness of breath and wheezing.   Cardiovascular: Negative for chest pain and palpitations.  Gastrointestinal: Negative for abdominal pain, constipation, diarrhea, nausea and vomiting.  Musculoskeletal: Negative for back pain and myalgias.  Neurological: Negative for dizziness, light-headedness and headaches.  Hematological: Bruises/bleeds easily.    Objective:   Physical Exam:  BP (!) 150/63 (BP Location: Left Arm, Patient Position: Sitting) Comment: Notified Nurse of BP  Pulse (!) 59   Temp 98 F (36.7 C) (Oral)   Resp 18   Ht 5\' 9"  (1.753 m)   Wt 171 lb 8 oz (77.8 kg)   SpO2 100%   BMI 25.33 kg/m  ECOG: 0  Physical Exam Constitutional:      General: He is not in acute distress.    Appearance: He is not diaphoretic.  HENT:     Head: Normocephalic.   Cardiovascular:     Rate and Rhythm: Normal rate and regular rhythm.     Heart sounds: Normal heart sounds. No murmur. No friction rub. No gallop.   Pulmonary:     Effort: Pulmonary effort is normal. No respiratory distress.     Breath sounds: Normal breath sounds. No wheezing or rales.  Skin:    General: Skin is warm and dry.     Findings: Bruising present. No erythema or rash.  Neurological:     Mental Status: He is alert.     Gait: Gait normal.  Psychiatric:        Mood and Affect: Mood normal.        Behavior: Behavior normal.        Thought Content: Thought content normal.        Judgment: Judgment normal.      Lab Review:     Component Value Date/Time   NA 136 10/20/2018 0930   NA 139 08/10/2012 1328   K 3.8 10/20/2018 0930   K 4.1 08/10/2012 1328   CL 100 10/20/2018 0930   CL 105 08/10/2012 1328   CO2 25 10/20/2018 0930   CO2 29 08/10/2012 1328   GLUCOSE 132 (H) 10/20/2018 0930   GLUCOSE 112 (H) 08/10/2012 1328   BUN 13 10/20/2018 0930   BUN 18.0 08/10/2012 1328   CREATININE 0.97 10/20/2018 0930   CREATININE 1.1 08/10/2012 1328   CALCIUM 9.5 10/20/2018 0930   CALCIUM 9.0 08/10/2012 1328   PROT 7.3 10/20/2018 0930   PROT 6.1 (L) 08/10/2012 1328   ALBUMIN 3.0 (L) 10/20/2018 0930   ALBUMIN 3.2 (L) 08/10/2012 1328   AST 29 10/20/2018 0930   AST 42 (H) 08/10/2012 1328   ALT 32 10/20/2018 0930   ALT 55 08/10/2012 1328   ALKPHOS 110 10/20/2018 0930   ALKPHOS 144 08/10/2012 1328   BILITOT 0.3 10/20/2018 0930   BILITOT 0.91 08/10/2012 1328   GFRNONAA >60 10/20/2018 0930   GFRAA >60 10/20/2018 0930       Component Value Date/Time   WBC 5.5 10/20/2018 0930   WBC 1.2 (LL) 10/14/2018 0448   RBC 3.22 (L) 10/20/2018 0930   HGB 10.6 (L) 10/20/2018 0930   HGB 14.7 05/11/2013 1235   HCT 30.8 (L) 10/20/2018 0930   HCT 42.5 05/11/2013 1235   PLT 189 10/20/2018 0930   PLT 131 (L) 05/11/2013 1235   MCV 95.7 10/20/2018 0930   MCV 100.7 (H) 05/11/2013 1235   MCH 32.9 10/20/2018 0930   MCHC 34.4 10/20/2018 0930   RDW 11.9 10/20/2018 0930   RDW 12.8 05/11/2013 1235   LYMPHSABS 1.2 10/20/2018 0930   LYMPHSABS 2.1 05/11/2013 1235   MONOABS 0.6 10/20/2018 0930   MONOABS 0.5 05/11/2013 1235   EOSABS 0.0 10/20/2018 0930   EOSABS 0.1 05/11/2013 1235   BASOSABS 0.0 10/20/2018 0930   BASOSABS 0.1 05/11/2013 1235   -------------------------------  Imaging from last 24 hours (if applicable):  Radiology interpretation: Dg Chest 2 View  Result Date: 09/24/2018 CLINICAL DATA:  78 year old male with chest malignancy, preop chest x-ray EXAM: CHEST - 2 VIEW COMPARISON:  08/06/2018,  08/04/2018, PET-CT 07/27/2018 FINDINGS: Cardiomediastinal silhouette unchanged in size and contour. Surgical changes of median sternotomy and CABG. No pneumothorax. No pleural effusion. No new confluent airspace disease. Lateral view demonstrates opacity at the posterior lung base overlying the anterior aspect of the thoracic spine. Osteopenia IMPRESSION: Chronic lung changes without evidence of acute cardiopulmonary disease. Opacity on the lateral view corresponds to known malignancy of the right lower lobe. Surgical changes of prior median sternotomy and CABG Electronically Signed   By: Corrie Mckusick D.O.   On: 09/24/2018 10:05   Ct Head Wo Contrast  Result Date: 10/14/2018 CLINICAL DATA:  Cerebral hemorrhage suspected. New diagnosis of lung cancer with fall yesterday. EXAM: CT HEAD WITHOUT CONTRAST TECHNIQUE: Contiguous axial images were obtained from the base of the skull through the vertex without intravenous contrast. COMPARISON:  Head CT 10/13/2018 FINDINGS: Brain: No evidence of acute infarction, hemorrhage, hydrocephalus, extra-axial collection or mass lesion/mass effect. There  is history of intracranial metastatic disease that is not visible by noncontrast head CT. Vascular: Atherosclerotic calcification Skull: Known left C1 lateral mass metastasis. There is asymmetric lateral atlanto dental interval with widening on the right, stable from prior brain MRI. Left forehead hematoma without underlying fracture. Sinuses/Orbits: No evidence of postseptal injury. Bilateral cataract resection. IMPRESSION: 1. Stable from yesterday.  No evidence of intracranial injury. 2. Scalp hematoma without acute fracture. 3. Known C1 left lateral mass metastasis. Electronically Signed   By: Monte Fantasia M.D.   On: 10/14/2018 08:35   Ct Head Wo Contrast  Result Date: 10/13/2018 CLINICAL DATA:  78 y/o M; fall with ecchymoses to the left orbit and eyelid. EXAM: CT HEAD WITHOUT CONTRAST TECHNIQUE: Contiguous axial images  were obtained from the base of the skull through the vertex without intravenous contrast. COMPARISON:  08/22/2018 MRI head. FINDINGS: Brain: No evidence of acute infarction, hemorrhage, hydrocephalus, extra-axial collection or mass lesion/mass effect. Stable volume loss of the brain. Vascular: Calcific atherosclerosis of the carotid siphons and vertebral arteries. Skull: Left frontal scalp and periorbital contusion. No calvarial fracture. No traumatic finding of the orbital compartment. Sinuses/Orbits: No acute finding. Other: None. IMPRESSION: 1. Left frontal scalp and periorbital contusion. No acute fracture. 2. No acute intracranial abnormality. 3. Stable volume loss of the brain. Electronically Signed   By: Kristine Garbe M.D.   On: 10/13/2018 15:38   Dg Fluoro Guide Cv Line-no Report  Result Date: 09/24/2018 Fluoroscopy was utilized by the requesting physician.  No radiographic interpretation.        This case was discussed with Dr. Julien Nordmann. He expressed agreement with my management of this patient.

## 2018-10-27 ENCOUNTER — Encounter: Payer: Self-pay | Admitting: Oncology

## 2018-10-27 ENCOUNTER — Other Ambulatory Visit: Payer: Medicare Other

## 2018-10-27 ENCOUNTER — Inpatient Hospital Stay: Payer: Medicare Other

## 2018-10-27 ENCOUNTER — Inpatient Hospital Stay (HOSPITAL_BASED_OUTPATIENT_CLINIC_OR_DEPARTMENT_OTHER): Payer: Medicare Other | Admitting: Oncology

## 2018-10-27 ENCOUNTER — Telehealth: Payer: Self-pay | Admitting: Medical Oncology

## 2018-10-27 ENCOUNTER — Inpatient Hospital Stay: Payer: Medicare Other | Attending: Oncology

## 2018-10-27 VITALS — BP 127/50 | HR 63 | Temp 97.8°F | Resp 18 | Ht 69.0 in | Wt 169.8 lb

## 2018-10-27 DIAGNOSIS — C7931 Secondary malignant neoplasm of brain: Secondary | ICD-10-CM

## 2018-10-27 DIAGNOSIS — C7970 Secondary malignant neoplasm of unspecified adrenal gland: Secondary | ICD-10-CM | POA: Diagnosis not present

## 2018-10-27 DIAGNOSIS — E46 Unspecified protein-calorie malnutrition: Secondary | ICD-10-CM

## 2018-10-27 DIAGNOSIS — T451X5D Adverse effect of antineoplastic and immunosuppressive drugs, subsequent encounter: Secondary | ICD-10-CM | POA: Insufficient documentation

## 2018-10-27 DIAGNOSIS — C3431 Malignant neoplasm of lower lobe, right bronchus or lung: Secondary | ICD-10-CM

## 2018-10-27 DIAGNOSIS — Z95828 Presence of other vascular implants and grafts: Secondary | ICD-10-CM

## 2018-10-27 DIAGNOSIS — K5900 Constipation, unspecified: Secondary | ICD-10-CM | POA: Diagnosis not present

## 2018-10-27 DIAGNOSIS — C3491 Malignant neoplasm of unspecified part of right bronchus or lung: Secondary | ICD-10-CM

## 2018-10-27 DIAGNOSIS — D696 Thrombocytopenia, unspecified: Secondary | ICD-10-CM | POA: Diagnosis not present

## 2018-10-27 DIAGNOSIS — I10 Essential (primary) hypertension: Secondary | ICD-10-CM | POA: Insufficient documentation

## 2018-10-27 DIAGNOSIS — Z7982 Long term (current) use of aspirin: Secondary | ICD-10-CM | POA: Diagnosis not present

## 2018-10-27 DIAGNOSIS — Z8546 Personal history of malignant neoplasm of prostate: Secondary | ICD-10-CM | POA: Diagnosis not present

## 2018-10-27 DIAGNOSIS — C7951 Secondary malignant neoplasm of bone: Secondary | ICD-10-CM | POA: Insufficient documentation

## 2018-10-27 DIAGNOSIS — Z87891 Personal history of nicotine dependence: Secondary | ICD-10-CM | POA: Diagnosis not present

## 2018-10-27 DIAGNOSIS — Z5112 Encounter for antineoplastic immunotherapy: Secondary | ICD-10-CM | POA: Diagnosis not present

## 2018-10-27 DIAGNOSIS — C7971 Secondary malignant neoplasm of right adrenal gland: Secondary | ICD-10-CM

## 2018-10-27 DIAGNOSIS — Z79899 Other long term (current) drug therapy: Secondary | ICD-10-CM | POA: Insufficient documentation

## 2018-10-27 DIAGNOSIS — D701 Agranulocytosis secondary to cancer chemotherapy: Secondary | ICD-10-CM | POA: Insufficient documentation

## 2018-10-27 DIAGNOSIS — C7972 Secondary malignant neoplasm of left adrenal gland: Secondary | ICD-10-CM

## 2018-10-27 DIAGNOSIS — C771 Secondary and unspecified malignant neoplasm of intrathoracic lymph nodes: Secondary | ICD-10-CM

## 2018-10-27 LAB — CBC WITH DIFFERENTIAL (CANCER CENTER ONLY)
ABS IMMATURE GRANULOCYTES: 0.02 10*3/uL (ref 0.00–0.07)
Basophils Absolute: 0 10*3/uL (ref 0.0–0.1)
Basophils Relative: 0 %
Eosinophils Absolute: 0 10*3/uL (ref 0.0–0.5)
Eosinophils Relative: 0 %
HCT: 27.4 % — ABNORMAL LOW (ref 39.0–52.0)
Hemoglobin: 9.5 g/dL — ABNORMAL LOW (ref 13.0–17.0)
Immature Granulocytes: 1 %
Lymphocytes Relative: 28 %
Lymphs Abs: 0.7 10*3/uL (ref 0.7–4.0)
MCH: 33 pg (ref 26.0–34.0)
MCHC: 34.7 g/dL (ref 30.0–36.0)
MCV: 95.1 fL (ref 80.0–100.0)
MONOS PCT: 2 %
Monocytes Absolute: 0.1 10*3/uL (ref 0.1–1.0)
NEUTROS ABS: 1.8 10*3/uL (ref 1.7–7.7)
Neutrophils Relative %: 69 %
Platelet Count: 146 10*3/uL — ABNORMAL LOW (ref 150–400)
RBC: 2.88 MIL/uL — ABNORMAL LOW (ref 4.22–5.81)
RDW: 11.9 % (ref 11.5–15.5)
WBC Count: 2.5 10*3/uL — ABNORMAL LOW (ref 4.0–10.5)
nRBC: 0 % (ref 0.0–0.2)

## 2018-10-27 LAB — CMP (CANCER CENTER ONLY)
ALK PHOS: 108 U/L (ref 38–126)
ALT: 20 U/L (ref 0–44)
AST: 17 U/L (ref 15–41)
Albumin: 3.1 g/dL — ABNORMAL LOW (ref 3.5–5.0)
Anion gap: 8 (ref 5–15)
BUN: 27 mg/dL — ABNORMAL HIGH (ref 8–23)
CO2: 26 mmol/L (ref 22–32)
Calcium: 9 mg/dL (ref 8.9–10.3)
Chloride: 103 mmol/L (ref 98–111)
Creatinine: 0.86 mg/dL (ref 0.61–1.24)
GFR, Est AFR Am: >60 mL/min (ref >60)
GFR, Estimated: >60 mL/min (ref >60)
GLUCOSE: 120 mg/dL — AB (ref 70–99)
Potassium: 3.8 mmol/L (ref 3.5–5.1)
Sodium: 137 mmol/L (ref 135–145)
TOTAL PROTEIN: 6.7 g/dL (ref 6.5–8.1)
Total Bilirubin: 0.9 mg/dL (ref 0.3–1.2)

## 2018-10-27 MED ORDER — SODIUM CHLORIDE 0.9% FLUSH
10.0000 mL | Freq: Once | INTRAVENOUS | Status: AC
  Administered 2018-10-27: 10 mL
  Filled 2018-10-27: qty 10

## 2018-10-27 NOTE — Progress Notes (Signed)
[place] OFFICE PROGRESS NOTE  [name], MD [place] [address]  DIAGNOSIS:Stage IV non-small cell lung cancer, adenocarcinoma who presented with a large right lower lobe lung mass in addition to metastatic disease to the mediastinal lymph nodes, adrenal glands, and bone involving the right shoulder,lumbar spines, and pelvic bone.  Molecular Studies by [facility]:Showed no actionable mutations.  PRIOR THERAPY:1)palliative radiation to the right neck and shoulder.He is scheduled to have [place] to the brain lesions on 09/01/2018.  CURRENT THERAPY: 1)palliative systemic chemotherapy with carboplatin for an AUC of 5, Alimta 500 mg/m, and Keytruda 200 mg IV every 3 weeks. First dose September 30, 2018.  Status post 2 cycles.  Starting with cycle 2, his Alimta dose was reduced to 400 mg meter squared and the carboplatin dose was reduced to an AUC of 4 secondary to neutropenia and thrombocytopenia.  INTERVAL HISTORY: Tony [name] 78 y.o. male returns for a routine follow-up visit accompanied by his wife.  The patient reports that he is having fatigue and generalized weakness but not as bad as he experienced following his first cycle of chemotherapy.  He denies fevers and chills.  Denies chest pain, shortness of breath, cough, hemoptysis.  Reports mild nausea in the morning that resolves on its own.  Denies vomiting and diarrhea.  He reports constipation.  He has been using Metamucil or MiraLAX.  He reports that his appetite is fair but he has lost weight since his last visit.  Denies bleeding.  The patient is here for evaluation and repeat lab work.  MEDICAL HISTORY: Past Medical History:  Diagnosis Date  . Anemia   . Brain metastases ([place])   . CAD (coronary artery disease)   . Constipation   . Gout   . Heart disease   . History of blood transfusion   . History of radiation therapy 09/01/2018   brain, cerebella (5 sites)/ 20 Gy in 1  fraction.  Marland Kitchen HLD (hyperlipidemia) 10/13/2018  . HTN (hypertension) 10/13/2018  . Hyperlipidemia   . Hypertension   . Lung cancer ([place])   . Mass of lower lobe of right lung   . Myocardial infarction ([place])    MI in 2000  . Peripheral vascular disease ([facility])    Abdominal Aortic Aneurysm  . Prostate cancer [facility]) Nov. 2012  . Ulcer     ALLERGIES:  has No Known Allergies.  MEDICATIONS:  Current Outpatient Medications  Medication Sig Dispense Refill  . acetaminophen (TYLENOL) 650 MG CR tablet Take 650 mg by mouth daily as needed for pain.     Marland Kitchen aspirin 81 MG tablet Take 81 mg by mouth every evening.     . folic acid (FOLVITE) 546 MCG tablet Take 800 mcg by mouth daily.     . metoprolol tartrate (LOPRESSOR) 50 MG tablet Take 50 mg by mouth 2 (two) times daily.    . Multiple Vitamins-Minerals (MULTIVITAMIN WITH MINERALS) tablet Take 1 tablet by mouth daily.    . niacin 500 MG tablet Take 500 mg by mouth 2 (two) times daily.    . Omega-3 Fatty Acids (FISH OIL) 1200 MG CAPS Take 1,200 mg by mouth daily.     Marland Kitchen omeprazole (PRILOSEC OTC) 20 MG tablet Take 10 mg by mouth 2 (two) times a week.    . polyethylene glycol (MIRALAX / GLYCOLAX) packet Take 17 g by mouth 4 (four) times a week.     . pravastatin (PRAVACHOL) 10 MG tablet Take 10  mg by mouth every evening.     . Psyllium (METAMUCIL FIBER PO) Take 1 Dose by mouth 3 (three) times a week.     . ramipril (ALTACE) 10 MG capsule Take 10 mg by mouth every morning.     . triamterene-hydrochlorothiazide (MAXZIDE-25) 37.5-25 MG tablet Take 0.5 tablets by mouth every morning.      No current facility-administered medications for this visit.     SURGICAL HISTORY:  Past Surgical History:  Procedure Laterality Date  . ANAL FISSURECTOMY    . COLONOSCOPY    . CORONARY ARTERY BYPASS GRAFT     2000  . EYE SURGERY Left Nov. 2013   Cataract  . EYE SURGERY Right Feb. 2014   Cataract  . HERNIA REPAIR    . PORTACATH PLACEMENT Left 09/24/2018    Procedure: INSERTION PORT-A-CATH;  Surgeon: Melrose Nakayama, MD;  Location: [place];  Service: Thoracic;  Laterality: Left;  . PR VEIN BYPASS GRAFT,AORTO-FEM-POP  2000  . PROSTATE SURGERY  Nov. 2012  . ROBOT ASSISTED LAPAROSCOPIC RADICAL PROSTATECTOMY  08/08/2011   Procedure: ROBOTIC ASSISTED LAPAROSCOPIC RADICAL PROSTATECTOMY LEVEL 2;  Surgeon: Dutch Gray, MD;  Location: [facility];  Service: Urology;  Laterality: Bilateral;  with Bilateral Pelvic Lymphadenectomy   . SHOULDER SURGERY  1980'S  . VIDEO BRONCHOSCOPY WITH ENDOBRONCHIAL ULTRASOUND N/A 08/06/2018   Procedure: VIDEO BRONCHOSCOPY WITH ENDOBRONCHIAL ULTRASOUND;  Surgeon: Melrose Nakayama, MD;  Location: [facility];  Service: Thoracic;  Laterality: N/A;  . WISDOM TOOTH EXTRACTION      REVIEW OF SYSTEMS:   Review of Systems  Constitutional: Negative for chills, fever.  Positive for fatigue and weight loss. HENT:   Negative for mouth sores, nosebleeds, sore throat and trouble swallowing.   Eyes: Negative for eye problems and icterus.  Respiratory: Negative for cough, hemoptysis, shortness of breath and wheezing.   Cardiovascular: Negative for chest pain and leg swelling.  Gastrointestinal: Negative for abdominal pain, diarrhea, and vomiting.  Positive for nausea and constipation. Genitourinary: Negative for bladder incontinence, difficulty urinating, dysuria, frequency and hematuria.   Musculoskeletal: Negative for back pain, gait problem, neck pain and neck stiffness.  Skin: Negative for itching and rash.  Neurological: Negative for dizziness, extremity weakness, gait problem, headaches, light-headedness and seizures.  Hematological: Negative for adenopathy. Does not bruise/bleed easily.  Psychiatric/Behavioral: Negative for confusion, depression and sleep disturbance. The patient is not nervous/anxious.     PHYSICAL EXAMINATION:  Blood pressure (!) 127/50, pulse 63, temperature 97.8 F (36.6 C), temperature source Oral, resp.  rate 18, height 5\' 9"  (1.753 m), weight 169 lb 12.8 oz (77 kg), SpO2 100 %.  ECOG PERFORMANCE STATUS: 1 - Symptomatic but completely ambulatory  Physical Exam  Constitutional: Oriented to person, place, and time and well-developed, well-nourished, and in no distress. No distress.  HENT:  Head: Normocephalic.  Old bruise noted over his left eye. Mouth/Throat: Oropharynx is clear and moist. No oropharyngeal exudate.  Eyes: Conjunctivae are normal. Right eye exhibits no discharge. Left eye exhibits no discharge. No scleral icterus.  Neck: Normal range of motion. Neck supple.  Cardiovascular: Normal rate, regular rhythm, normal heart sounds and intact distal pulses.   Pulmonary/Chest: Effort normal and breath sounds normal. No respiratory distress. No wheezes. No rales.  Abdominal: Soft. Bowel sounds are normal. Exhibits no distension and no mass. There is no tenderness.  Musculoskeletal: Normal range of motion. Exhibits no edema.  Lymphadenopathy:    No cervical adenopathy.  Neurological: Alert and oriented to person, place,  and time. Exhibits normal muscle tone. Gait normal. Coordination normal.  Skin: Skin is warm and dry. No rash noted. Not diaphoretic. No erythema. No pallor.  Psychiatric: Mood, memory and judgment normal.  Vitals reviewed.  LABORATORY DATA: Lab Results  Component Value Date   WBC 2.5 (L) 10/27/2018   HGB 9.5 (L) 10/27/2018   HCT 27.4 (L) 10/27/2018   MCV 95.1 10/27/2018   PLT 146 (L) 10/27/2018      Chemistry      Component Value Date/Time   NA 137 10/27/2018 1038   NA 139 08/10/2012 1328   K 3.8 10/27/2018 1038   K 4.1 08/10/2012 1328   CL 103 10/27/2018 1038   CL 105 08/10/2012 1328   CO2 26 10/27/2018 1038   CO2 29 08/10/2012 1328   BUN 27 (H) 10/27/2018 1038   BUN 18.0 08/10/2012 1328   CREATININE 0.86 10/27/2018 1038   CREATININE 1.1 08/10/2012 1328      Component Value Date/Time   CALCIUM 9.0 10/27/2018 1038   CALCIUM 9.0 08/10/2012 1328    ALKPHOS 108 10/27/2018 1038   ALKPHOS 144 08/10/2012 1328   AST 17 10/27/2018 1038   AST 42 (H) 08/10/2012 1328   ALT 20 10/27/2018 1038   ALT 55 08/10/2012 1328   BILITOT 0.9 10/27/2018 1038   BILITOT 0.91 08/10/2012 1328       RADIOGRAPHIC STUDIES:  Ct Head Wo Contrast  Result Date: 10/14/2018 CLINICAL DATA:  Cerebral hemorrhage suspected. New diagnosis of lung cancer with fall yesterday. EXAM: CT HEAD WITHOUT CONTRAST TECHNIQUE: Contiguous axial images were obtained from the base of the skull through the vertex without intravenous contrast. COMPARISON:  Head CT 10/13/2018 FINDINGS: Brain: No evidence of acute infarction, hemorrhage, hydrocephalus, extra-axial collection or mass lesion/mass effect. There is history of intracranial metastatic disease that is not visible by noncontrast head CT. Vascular: Atherosclerotic calcification Skull: Known left C1 lateral mass metastasis. There is asymmetric lateral atlanto dental interval with widening on the right, stable from prior brain MRI. Left forehead hematoma without underlying fracture. Sinuses/Orbits: No evidence of postseptal injury. Bilateral cataract resection. IMPRESSION: 1. Stable from yesterday.  No evidence of intracranial injury. 2. Scalp hematoma without acute fracture. 3. Known C1 left lateral mass metastasis. Electronically Signed   By: Monte Fantasia M.D.   On: 10/14/2018 08:35   Ct Head Wo Contrast  Result Date: 10/13/2018 CLINICAL DATA:  78 y/o M; fall with ecchymoses to the left orbit and eyelid. EXAM: CT HEAD WITHOUT CONTRAST TECHNIQUE: Contiguous axial images were obtained from the base of the skull through the vertex without intravenous contrast. COMPARISON:  08/22/2018 MRI head. FINDINGS: Brain: No evidence of acute infarction, hemorrhage, hydrocephalus, extra-axial collection or mass lesion/mass effect. Stable volume loss of the brain. Vascular: Calcific atherosclerosis of the carotid siphons and vertebral arteries. Skull:  Left frontal scalp and periorbital contusion. No calvarial fracture. No traumatic finding of the orbital compartment. Sinuses/Orbits: No acute finding. Other: None. IMPRESSION: 1. Left frontal scalp and periorbital contusion. No acute fracture. 2. No acute intracranial abnormality. 3. Stable volume loss of the brain. Electronically Signed   By: Kristine Garbe M.D.   On: 10/13/2018 15:38     ASSESSMENT/PLAN:  Adenocarcinoma, lung, right ([place]) This is a very pleasant 78 year old white male with a stage IV non-small cell lung cancer, adenocarcinoma presented with large right lower lobe lung mass in addition to mediastinal lymphadenopathy, metastatic disease to the adrenal glands as well as several metastatic bone lesions  including the right shoulder, lumbar spine and pelvic bones.  The patient has no actionable mutations. He underwent palliative radiotherapy to the right shoulder and painful metastatic bone lesions. He is feeling much better after the radiotherapy. He is now receiving systemic chemotherapy tomorrow with carboplatin for AUC of 5, Alimta 500 mg/M2 and Keytruda 200 mg IV every 3 weeks.  The Alimta dose was reduced to 400 mg meter squared and the carboplatin dose was reduced to an AUC of 4 starting with cycle #2 secondary to thrombocytopenia and neutropenia.  The patient is status post 2 cycles.  He tolerated the second cycle well overall with the exception of fatigue, weight loss, and constipation. Labs are today reviewed with the patient.  His platelet count is only slightly low at 146,000.  He has asked about restarting his aspirin and he was advised that he may do so.  I have referred the patient to the dietitian due to his weight loss.  I encouraged him to drink 2 to 3 cans of boost or Ensure per day.  May consider starting him on appetite stimulant if his weight loss continues.  Constipation, he was advised to use a stool softener twice a day along with MiraLAX once a  day.  The patient will continue to have weekly labs.  He will keep his follow-up in 2 weeks for evaluation prior to cycle #3.  The patient was advised to call immediately if he has any concerning symptoms in the interval. The patient voices understanding of current disease status and treatment options and is in agreement with the current care plan.  All questions were answered. The patient knows to call the clinic with any problems, questions or concerns. We can certainly see the patient much sooner if necessary.   Orders Placed This Encounter  Procedures  . Amb Referral to Nutrition and Diabetic E    Referral Priority:   Routine    Referral Type:   Consultation    Referral Reason:   Specialty Services Required    Number of Visits Requested:   [place], DNP, AGPCNP-BC, AOCNP 10/27/18

## 2018-10-27 NOTE — Assessment & Plan Note (Signed)
This is a very pleasant 78 year old white male with a stage IV non-small cell lung cancer, adenocarcinoma presented with large right lower lobe lung mass in addition to mediastinal lymphadenopathy, metastatic disease to the adrenal glands as well as several metastatic bone lesions including the right shoulder, lumbar spine and pelvic bones.  The patient has no actionable mutations. He underwent palliative radiotherapy to the right shoulder and painful metastatic bone lesions. He is feeling much better after the radiotherapy. He is now receiving systemic chemotherapy tomorrow with carboplatin for AUC of 5, Alimta 500 mg/M2 and Keytruda 200 mg IV every 3 weeks.  The Alimta dose was reduced to 400 mg meter squared and the carboplatin dose was reduced to an AUC of 4 starting with cycle #2 secondary to thrombocytopenia and neutropenia.  The patient is status post 2 cycles.  He tolerated the second cycle well overall with the exception of fatigue, weight loss, and constipation. Labs are today reviewed with the patient.  His platelet count is only slightly low at 146,000.  He has asked about restarting his aspirin and he was advised that he may do so.  I have referred the patient to the dietitian due to his weight loss.  I encouraged him to drink 2 to 3 cans of boost or Ensure per day.  May consider starting him on appetite stimulant if his weight loss continues.  Constipation, he was advised to use a stool softener twice a day along with MiraLAX once a day.  The patient will continue to have weekly labs.  He will keep his follow-up in 2 weeks for evaluation prior to cycle #3.  The patient was advised to call immediately if he has any concerning symptoms in the interval. The patient voices understanding of current disease status and treatment options and is in agreement with the current care plan.  All questions were answered. The patient knows to call the clinic with any problems, questions or concerns. We  can certainly see the patient much sooner if necessary.

## 2018-10-27 NOTE — Telephone Encounter (Signed)
F/U from on call 10/23/2018 re Constipation. This has resolved . Last BM was this am . He asked if "castor oil is still used?" Message to [place].

## 2018-10-28 ENCOUNTER — Telehealth: Payer: Self-pay | Admitting: Oncology

## 2018-10-28 NOTE — Telephone Encounter (Signed)
Scheduled appt per 2/4 los - pt to get an updated schedule next visit.

## 2018-11-03 ENCOUNTER — Inpatient Hospital Stay: Payer: Medicare Other

## 2018-11-03 ENCOUNTER — Telehealth: Payer: Self-pay | Admitting: Physician Assistant

## 2018-11-03 DIAGNOSIS — Z95828 Presence of other vascular implants and grafts: Secondary | ICD-10-CM

## 2018-11-03 DIAGNOSIS — Z5112 Encounter for antineoplastic immunotherapy: Secondary | ICD-10-CM | POA: Diagnosis not present

## 2018-11-03 DIAGNOSIS — C3491 Malignant neoplasm of unspecified part of right bronchus or lung: Secondary | ICD-10-CM

## 2018-11-03 LAB — CMP (CANCER CENTER ONLY)
ALT: 36 U/L (ref 0–44)
AST: 27 U/L (ref 15–41)
Albumin: 3.4 g/dL — ABNORMAL LOW (ref 3.5–5.0)
Alkaline Phosphatase: 135 U/L — ABNORMAL HIGH (ref 38–126)
Anion gap: 9 (ref 5–15)
BUN: 17 mg/dL (ref 8–23)
CO2: 27 mmol/L (ref 22–32)
CREATININE: 0.91 mg/dL (ref 0.61–1.24)
Calcium: 9.3 mg/dL (ref 8.9–10.3)
Chloride: 103 mmol/L (ref 98–111)
GFR, Est AFR Am: >60 mL/min (ref >60)
GFR, Estimated: >60 mL/min (ref >60)
Glucose, Bld: 118 mg/dL — ABNORMAL HIGH (ref 70–99)
Potassium: 4.3 mmol/L (ref 3.5–5.1)
Sodium: 139 mmol/L (ref 135–145)
Total Bilirubin: 0.4 mg/dL (ref 0.3–1.2)
Total Protein: 6.9 g/dL (ref 6.5–8.1)

## 2018-11-03 LAB — CBC WITH DIFFERENTIAL (CANCER CENTER ONLY)
Abs Immature Granulocytes: 0.01 10*3/uL (ref 0.00–0.07)
BASOS ABS: 0 10*3/uL (ref 0.0–0.1)
Basophils Relative: 0 %
EOS PCT: 1 %
Eosinophils Absolute: 0 10*3/uL (ref 0.0–0.5)
HCT: 26.1 % — ABNORMAL LOW (ref 39.0–52.0)
Hemoglobin: 8.9 g/dL — ABNORMAL LOW (ref 13.0–17.0)
Immature Granulocytes: 1 %
Lymphocytes Relative: 51 %
Lymphs Abs: 0.9 10*3/uL (ref 0.7–4.0)
MCH: 32.5 pg (ref 26.0–34.0)
MCHC: 34.1 g/dL (ref 30.0–36.0)
MCV: 95.3 fL (ref 80.0–100.0)
Monocytes Absolute: 0.3 10*3/uL (ref 0.1–1.0)
Monocytes Relative: 15 %
Neutro Abs: 0.6 10*3/uL — ABNORMAL LOW (ref 1.7–7.7)
Neutrophils Relative %: 32 %
Platelet Count: 44 10*3/uL — ABNORMAL LOW (ref 150–400)
RBC: 2.74 MIL/uL — ABNORMAL LOW (ref 4.22–5.81)
RDW: 11.9 % (ref 11.5–15.5)
WBC: 1.8 10*3/uL — AB (ref 4.0–10.5)
nRBC: 1.1 % — ABNORMAL HIGH (ref 0.0–0.2)

## 2018-11-03 MED ORDER — SODIUM CHLORIDE 0.9% FLUSH
10.0000 mL | Freq: Once | INTRAVENOUS | Status: AC
  Administered 2018-11-03: 10 mL
  Filled 2018-11-03: qty 10

## 2018-11-03 MED ORDER — HEPARIN SOD (PORK) LOCK FLUSH 100 UNIT/ML IV SOLN
500.0000 [IU] | Freq: Once | INTRAVENOUS | Status: AC
  Administered 2018-11-03: 500 [IU]
  Filled 2018-11-03: qty 5

## 2018-11-03 NOTE — Telephone Encounter (Signed)
Called patient to discuss recent labs. Labs demonstrated thrombocytopenia and neutropenia. Patient denies any bleeding or bruising. He was advised to stop taking the aspirin. No transfusion indicated at this time. WBC's low. Neutropenic precautions discussed with patient. He is aware to call the office should he develop signs of an infection or a fever. The patient expressed understanding and all questions were answered. He will plan on returning for scheduled appointment in 1 week but knows to call sooner should he develop any concerns or complaints in the interval.

## 2018-11-09 ENCOUNTER — Other Ambulatory Visit: Payer: Self-pay | Admitting: *Deleted

## 2018-11-09 DIAGNOSIS — C3491 Malignant neoplasm of unspecified part of right bronchus or lung: Secondary | ICD-10-CM

## 2018-11-10 ENCOUNTER — Inpatient Hospital Stay: Payer: Medicare Other

## 2018-11-10 ENCOUNTER — Inpatient Hospital Stay: Payer: Medicare Other | Admitting: Nutrition

## 2018-11-10 ENCOUNTER — Telehealth: Payer: Self-pay | Admitting: Internal Medicine

## 2018-11-10 ENCOUNTER — Encounter: Payer: Self-pay | Admitting: Internal Medicine

## 2018-11-10 ENCOUNTER — Inpatient Hospital Stay (HOSPITAL_BASED_OUTPATIENT_CLINIC_OR_DEPARTMENT_OTHER): Payer: Medicare Other | Admitting: Internal Medicine

## 2018-11-10 VITALS — BP 137/59 | HR 69 | Temp 97.6°F | Resp 18 | Ht 69.0 in | Wt 168.0 lb

## 2018-11-10 DIAGNOSIS — Z5112 Encounter for antineoplastic immunotherapy: Secondary | ICD-10-CM

## 2018-11-10 DIAGNOSIS — C3431 Malignant neoplasm of lower lobe, right bronchus or lung: Secondary | ICD-10-CM | POA: Diagnosis not present

## 2018-11-10 DIAGNOSIS — Z95828 Presence of other vascular implants and grafts: Secondary | ICD-10-CM

## 2018-11-10 DIAGNOSIS — C3491 Malignant neoplasm of unspecified part of right bronchus or lung: Secondary | ICD-10-CM

## 2018-11-10 DIAGNOSIS — C7931 Secondary malignant neoplasm of brain: Secondary | ICD-10-CM | POA: Diagnosis not present

## 2018-11-10 DIAGNOSIS — C7951 Secondary malignant neoplasm of bone: Secondary | ICD-10-CM

## 2018-11-10 DIAGNOSIS — C7970 Secondary malignant neoplasm of unspecified adrenal gland: Secondary | ICD-10-CM | POA: Diagnosis not present

## 2018-11-10 DIAGNOSIS — Z5111 Encounter for antineoplastic chemotherapy: Secondary | ICD-10-CM

## 2018-11-10 DIAGNOSIS — C3490 Malignant neoplasm of unspecified part of unspecified bronchus or lung: Secondary | ICD-10-CM

## 2018-11-10 DIAGNOSIS — I10 Essential (primary) hypertension: Secondary | ICD-10-CM

## 2018-11-10 DIAGNOSIS — K5900 Constipation, unspecified: Secondary | ICD-10-CM

## 2018-11-10 DIAGNOSIS — D701 Agranulocytosis secondary to cancer chemotherapy: Secondary | ICD-10-CM

## 2018-11-10 DIAGNOSIS — D696 Thrombocytopenia, unspecified: Secondary | ICD-10-CM

## 2018-11-10 DIAGNOSIS — Z79899 Other long term (current) drug therapy: Secondary | ICD-10-CM

## 2018-11-10 LAB — CBC WITH DIFFERENTIAL (CANCER CENTER ONLY)
ABS IMMATURE GRANULOCYTES: 0.16 10*3/uL — AB (ref 0.00–0.07)
Basophils Absolute: 0 10*3/uL (ref 0.0–0.1)
Basophils Relative: 0 %
Eosinophils Absolute: 0 10*3/uL (ref 0.0–0.5)
Eosinophils Relative: 0 %
HCT: 27.8 % — ABNORMAL LOW (ref 39.0–52.0)
Hemoglobin: 9.3 g/dL — ABNORMAL LOW (ref 13.0–17.0)
Immature Granulocytes: 3 %
Lymphocytes Relative: 18 %
Lymphs Abs: 1.2 10*3/uL (ref 0.7–4.0)
MCH: 32.7 pg (ref 26.0–34.0)
MCHC: 33.5 g/dL (ref 30.0–36.0)
MCV: 97.9 fL (ref 80.0–100.0)
MONO ABS: 1.1 10*3/uL — AB (ref 0.1–1.0)
Monocytes Relative: 17 %
NEUTROS ABS: 4 10*3/uL (ref 1.7–7.7)
Neutrophils Relative %: 62 %
Platelet Count: 221 10*3/uL (ref 150–400)
RBC: 2.84 MIL/uL — ABNORMAL LOW (ref 4.22–5.81)
RDW: 16.2 % — ABNORMAL HIGH (ref 11.5–15.5)
WBC Count: 6.5 10*3/uL (ref 4.0–10.5)
nRBC: 0 % (ref 0.0–0.2)

## 2018-11-10 LAB — CMP (CANCER CENTER ONLY)
ALT: 22 U/L (ref 0–44)
AST: 19 U/L (ref 15–41)
Albumin: 3.2 g/dL — ABNORMAL LOW (ref 3.5–5.0)
Alkaline Phosphatase: 125 U/L (ref 38–126)
Anion gap: 10 (ref 5–15)
BUN: 20 mg/dL (ref 8–23)
CHLORIDE: 101 mmol/L (ref 98–111)
CO2: 25 mmol/L (ref 22–32)
CREATININE: 1.03 mg/dL (ref 0.61–1.24)
Calcium: 9.2 mg/dL (ref 8.9–10.3)
GFR, Est AFR Am: >60 mL/min (ref >60)
GFR, Estimated: >60 mL/min (ref >60)
Glucose, Bld: 108 mg/dL — ABNORMAL HIGH (ref 70–99)
Potassium: 4.2 mmol/L (ref 3.5–5.1)
Sodium: 136 mmol/L (ref 135–145)
Total Bilirubin: 0.5 mg/dL (ref 0.3–1.2)
Total Protein: 7.1 g/dL (ref 6.5–8.1)

## 2018-11-10 MED ORDER — SODIUM CHLORIDE 0.9 % IV SOLN
200.0000 mg | Freq: Once | INTRAVENOUS | Status: AC
  Administered 2018-11-10: 200 mg via INTRAVENOUS
  Filled 2018-11-10: qty 8

## 2018-11-10 MED ORDER — SODIUM CHLORIDE 0.9% FLUSH
10.0000 mL | INTRAVENOUS | Status: DC | PRN
  Administered 2018-11-10: 10 mL
  Filled 2018-11-10: qty 10

## 2018-11-10 MED ORDER — PALONOSETRON HCL INJECTION 0.25 MG/5ML
INTRAVENOUS | Status: AC
  Filled 2018-11-10: qty 5

## 2018-11-10 MED ORDER — SODIUM CHLORIDE 0.9 % IV SOLN
Freq: Once | INTRAVENOUS | Status: AC
  Administered 2018-11-10: 12:00:00 via INTRAVENOUS
  Filled 2018-11-10: qty 250

## 2018-11-10 MED ORDER — HEPARIN SOD (PORK) LOCK FLUSH 100 UNIT/ML IV SOLN
500.0000 [IU] | Freq: Once | INTRAVENOUS | Status: AC | PRN
  Administered 2018-11-10: 500 [IU]
  Filled 2018-11-10: qty 5

## 2018-11-10 MED ORDER — SODIUM CHLORIDE 0.9 % IV SOLN
405.0000 mg/m2 | Freq: Once | INTRAVENOUS | Status: AC
  Administered 2018-11-10: 800 mg via INTRAVENOUS
  Filled 2018-11-10: qty 12

## 2018-11-10 MED ORDER — CYANOCOBALAMIN 1000 MCG/ML IJ SOLN
1000.0000 ug | Freq: Once | INTRAMUSCULAR | Status: AC
  Administered 2018-11-10: 1000 ug via INTRAMUSCULAR

## 2018-11-10 MED ORDER — SODIUM CHLORIDE 0.9 % IV SOLN
364.8000 mg | Freq: Once | INTRAVENOUS | Status: AC
  Administered 2018-11-10: 360 mg via INTRAVENOUS
  Filled 2018-11-10: qty 36

## 2018-11-10 MED ORDER — CYANOCOBALAMIN 1000 MCG/ML IJ SOLN
INTRAMUSCULAR | Status: AC
  Filled 2018-11-10: qty 1

## 2018-11-10 MED ORDER — PALONOSETRON HCL INJECTION 0.25 MG/5ML
0.2500 mg | Freq: Once | INTRAVENOUS | Status: AC
  Administered 2018-11-10: 0.25 mg via INTRAVENOUS

## 2018-11-10 MED ORDER — SODIUM CHLORIDE 0.9 % IV SOLN
Freq: Once | INTRAVENOUS | Status: AC
  Administered 2018-11-10: 13:00:00 via INTRAVENOUS
  Filled 2018-11-10: qty 5

## 2018-11-10 MED ORDER — SODIUM CHLORIDE 0.9% FLUSH
10.0000 mL | Freq: Once | INTRAVENOUS | Status: AC
  Administered 2018-11-10: 10 mL
  Filled 2018-11-10: qty 10

## 2018-11-10 NOTE — Patient Instructions (Signed)
[facility] Discharge Instructions for Patients Receiving Chemotherapy  Today you received the following chemotherapy agents Keytruda, Alimta, and Carboplatin  To help prevent nausea and vomiting after your treatment, we encourage you to take your nausea medication as directed.  If you develop nausea and vomiting that is not controlled by your nausea medication, call the clinic.   BELOW ARE SYMPTOMS THAT SHOULD BE REPORTED IMMEDIATELY:  *FEVER GREATER THAN 100.5 F  *CHILLS WITH OR WITHOUT FEVER  NAUSEA AND VOMITING THAT IS NOT CONTROLLED WITH YOUR NAUSEA MEDICATION  *UNUSUAL SHORTNESS OF BREATH  *UNUSUAL BRUISING OR BLEEDING  TENDERNESS IN MOUTH AND THROAT WITH OR WITHOUT PRESENCE OF ULCERS  *URINARY PROBLEMS  *BOWEL PROBLEMS  UNUSUAL RASH Items with * indicate a potential emergency and should be followed up as soon as possible.  Feel free to call the clinic should you have any questions or concerns. The clinic phone number is [phone].  Please show the CHEMO ALERT CARD at check-in to the Emergency Department and triage nurse.   

## 2018-11-10 NOTE — Progress Notes (Signed)
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  Molecular Studies by [facility]: Showed no actionable mutations.  PRIOR THERAPY:1) palliative radiation to the right neck and shoulder.  He is scheduled to have [place] to the brain lesions on 09/01/2018.  CURRENT THERAPY: 1) palliative systemic chemotherapy with carboplatin for an AUC of 5, Alimta 500 mg/m, and Keytruda 200 mg IV every 3 weeks.  First dose September 30, 2018.  Status post 2 cycles.  INTERVAL HISTORY: Tony [name] 78 y.o. male returns to the clinic today for follow-up visit accompanied by his wife.  The patient is feeling fine today with no concerning complaints except for generalized fatigue.  He has been tolerating his systemic chemotherapy fairly well.  He denied having any nausea, vomiting, diarrhea or constipation.  He denied having any fever or chills.  He has no chest pain, shortness of breath, cough or hemoptysis.  He has no headache or visual changes.  The patient denied having any significant weight loss or night sweats.  He denied having any headache or visual changes.  He is here today for evaluation before starting cycle #3.  MEDICAL HISTORY: Past Medical History:  Diagnosis Date  . Anemia   . Brain metastases ([facility])   . CAD (coronary artery disease)   . Constipation   . Gout   . Heart disease   . History of blood transfusion   . History of radiation therapy 09/01/2018   brain, cerebella (5 sites)/ 20 Gy in 1 fraction.  Marland Kitchen HLD (hyperlipidemia) 10/13/2018  . HTN (hypertension) 10/13/2018  . Hyperlipidemia   . Hypertension     . Lung cancer ([place])   . Mass of lower lobe of right lung   . Myocardial infarction ([place])    MI in 2000  . Peripheral vascular disease ([facility])    Abdominal Aortic Aneurysm  . Prostate cancer [facility]) Nov. 2012  . Ulcer     ALLERGIES:  has No Known Allergies.  MEDICATIONS:  Current Outpatient Medications  Medication Sig Dispense Refill  . acetaminophen (TYLENOL) 650 MG CR tablet Take 650 mg by mouth daily as needed for pain.     Marland Kitchen aspirin 81 MG tablet Take 81 mg by mouth every evening.     . folic acid (FOLVITE) 283 MCG tablet Take 800 mcg by mouth daily.     . metoprolol tartrate (LOPRESSOR) 50 MG tablet Take 50 mg by mouth 2 (two) times daily.    . Multiple Vitamins-Minerals (MULTIVITAMIN WITH MINERALS) tablet Take 1 tablet by mouth daily.    . niacin 500 MG tablet Take 500 mg by mouth 2 (two) times daily.    . Omega-3 Fatty Acids (FISH OIL) 1200 MG CAPS Take 1,200 mg by mouth daily.     Marland Kitchen omeprazole (PRILOSEC OTC) 20 MG tablet Take 10 mg by mouth 2 (two) times a week.    . polyethylene glycol (MIRALAX / GLYCOLAX) packet Take 17 g by mouth 4 (four) times a week.     . pravastatin (PRAVACHOL) 10 MG tablet Take 10 mg by mouth every evening.     . Psyllium (METAMUCIL FIBER PO) Take 1  Dose by mouth 3 (three) times a week.     . ramipril (ALTACE) 10 MG capsule Take 10 mg by mouth every morning.     . triamterene-hydrochlorothiazide (MAXZIDE-25) 37.5-25 MG tablet Take 0.5 tablets by mouth every morning.      No current facility-administered medications for this visit.     SURGICAL HISTORY:  Past Surgical History:  Procedure Laterality Date  . ANAL FISSURECTOMY    . COLONOSCOPY    . CORONARY ARTERY BYPASS GRAFT     2000  . EYE SURGERY Left Nov. 2013   Cataract  . EYE SURGERY Right Feb. 2014   Cataract  . HERNIA REPAIR    . PORTACATH PLACEMENT Left 09/24/2018   Procedure: INSERTION PORT-A-CATH;  Surgeon: Melrose Nakayama, MD;  Location: [facility];  Service: Thoracic;  Laterality:  Left;  . PR VEIN BYPASS GRAFT,AORTO-FEM-POP  2000  . PROSTATE SURGERY  Nov. 2012  . ROBOT ASSISTED LAPAROSCOPIC RADICAL PROSTATECTOMY  08/08/2011   Procedure: ROBOTIC ASSISTED LAPAROSCOPIC RADICAL PROSTATECTOMY LEVEL 2;  Surgeon: Dutch Gray, MD;  Location: [facility];  Service: Urology;  Laterality: Bilateral;  with Bilateral Pelvic Lymphadenectomy   . SHOULDER SURGERY  1980'S  . VIDEO BRONCHOSCOPY WITH ENDOBRONCHIAL ULTRASOUND N/A 08/06/2018   Procedure: VIDEO BRONCHOSCOPY WITH ENDOBRONCHIAL ULTRASOUND;  Surgeon: Melrose Nakayama, MD;  Location: [facility];  Service: Thoracic;  Laterality: N/A;  . WISDOM TOOTH EXTRACTION      REVIEW OF SYSTEMS:  A comprehensive review of systems was negative except for: Constitutional: positive for fatigue Musculoskeletal: positive for muscle weakness   PHYSICAL EXAMINATION: General appearance: alert, cooperative, fatigued and no distress Head: Normocephalic, without obvious abnormality, atraumatic Neck: no adenopathy, no JVD, supple, symmetrical, trachea midline and thyroid not enlarged, symmetric, no tenderness/mass/nodules Lymph nodes: Cervical, supraclavicular, and axillary nodes normal. Resp: clear to auscultation bilaterally Back: symmetric, no curvature. ROM normal. No CVA tenderness. Cardio: regular rate and rhythm, S1, S2 normal, no murmur, click, rub or gallop GI: soft, non-tender; bowel sounds normal; no masses,  no organomegaly Extremities: extremities normal, atraumatic, no cyanosis or edema  ECOG PERFORMANCE STATUS: 1 - Symptomatic but completely ambulatory  Blood pressure (!) 137/59, pulse 69, temperature 97.6 F (36.4 C), temperature source Oral, resp. rate 18, height 5\' 9"  (1.753 m), weight 168 lb (76.2 kg), SpO2 100 %.  LABORATORY DATA: Lab Results  Component Value Date   WBC 6.5 11/10/2018   HGB 9.3 (L) 11/10/2018   HCT 27.8 (L) 11/10/2018   MCV 97.9 11/10/2018   PLT 221 11/10/2018      Chemistry      Component Value Date/Time    NA 139 11/03/2018 1124   NA 139 08/10/2012 1328   K 4.3 11/03/2018 1124   K 4.1 08/10/2012 1328   CL 103 11/03/2018 1124   CL 105 08/10/2012 1328   CO2 27 11/03/2018 1124   CO2 29 08/10/2012 1328   BUN 17 11/03/2018 1124   BUN 18.0 08/10/2012 1328   CREATININE 0.91 11/03/2018 1124   CREATININE 1.1 08/10/2012 1328      Component Value Date/Time   CALCIUM 9.3 11/03/2018 1124   CALCIUM 9.0 08/10/2012 1328   ALKPHOS 135 (H) 11/03/2018 1124   ALKPHOS 144 08/10/2012 1328   AST 27 11/03/2018 1124   AST 42 (H) 08/10/2012 1328   ALT 36 11/03/2018 1124   ALT 55 08/10/2012 1328   BILITOT 0.4 11/03/2018 1124   BILITOT 0.91 08/10/2012 1328       RADIOGRAPHIC  STUDIES: Ct Head Wo Contrast  Result Date: 10/14/2018 CLINICAL DATA:  Cerebral hemorrhage suspected. New diagnosis of lung cancer with fall yesterday. EXAM: CT HEAD WITHOUT CONTRAST TECHNIQUE: Contiguous axial images were obtained from the base of the skull through the vertex without intravenous contrast. COMPARISON:  Head CT 10/13/2018 FINDINGS: Brain: No evidence of acute infarction, hemorrhage, hydrocephalus, extra-axial collection or mass lesion/mass effect. There is history of intracranial metastatic disease that is not visible by noncontrast head CT. Vascular: Atherosclerotic calcification Skull: Known left C1 lateral mass metastasis. There is asymmetric lateral atlanto dental interval with widening on the right, stable from prior brain MRI. Left forehead hematoma without underlying fracture. Sinuses/Orbits: No evidence of postseptal injury. Bilateral cataract resection. IMPRESSION: 1. Stable from yesterday.  No evidence of intracranial injury. 2. Scalp hematoma without acute fracture. 3. Known C1 left lateral mass metastasis. Electronically Signed   By: Monte Fantasia M.D.   On: 10/14/2018 08:35   Ct Head Wo Contrast  Result Date: 10/13/2018 CLINICAL DATA:  78 y/o M; fall with ecchymoses to the left orbit and eyelid. EXAM: CT  HEAD WITHOUT CONTRAST TECHNIQUE: Contiguous axial images were obtained from the base of the skull through the vertex without intravenous contrast. COMPARISON:  08/22/2018 MRI head. FINDINGS: Brain: No evidence of acute infarction, hemorrhage, hydrocephalus, extra-axial collection or mass lesion/mass effect. Stable volume loss of the brain. Vascular: Calcific atherosclerosis of the carotid siphons and vertebral arteries. Skull: Left frontal scalp and periorbital contusion. No calvarial fracture. No traumatic finding of the orbital compartment. Sinuses/Orbits: No acute finding. Other: None. IMPRESSION: 1. Left frontal scalp and periorbital contusion. No acute fracture. 2. No acute intracranial abnormality. 3. Stable volume loss of the brain. Electronically Signed   By: Kristine Garbe M.D.   On: 10/13/2018 15:38    ASSESSMENT AND PLAN: This is a very pleasant 78 years old white male with a stage IV non-small cell lung cancer, adenocarcinoma presented with large right lower lobe lung mass in addition to mediastinal lymphadenopathy, metastatic disease to the adrenal glands as well as several metastatic bone lesions including the right shoulder, lumbar spine and pelvic bones.  The patient has no actionable mutations. He underwent palliative radiotherapy to the right shoulder and painful metastatic bone lesions. He is feeling much better after the radiotherapy. He is currently undergoing systemic chemotherapy tomorrow with carboplatin for AUC of 5, Alimta 500 mg/M2 and Keytruda 200 mg IV every 3 weeks.  Status post 2 cycles. The patient has been tolerating this treatment well except for fatigue. I recommended for him to proceed with cycle #3 today as scheduled. I will see him back for follow-up visit in 3 weeks for evaluation after repeating CT scan of the chest, abdomen and pelvis for restaging of his disease. He was advised to call immediately if he has any concerning symptoms in the interval. The  patient voices understanding of current disease status and treatment options and is in agreement with the current care plan. All questions were answered. The patient knows to call the clinic with any problems, questions or concerns. We can certainly see the patient much sooner if necessary.  I spent 10 minutes counseling the patient face to face. The total time spent in the appointment was 15 minutes.  Disclaimer: This note was dictated with voice recognition software. Similar sounding words can inadvertently be transcribed and may not be corrected upon review.

## 2018-11-10 NOTE — Telephone Encounter (Signed)
Scheduled appt per 2/18 los.  Printed calendar and avs.  Gave patient the number to central radiology.

## 2018-11-10 NOTE — Progress Notes (Signed)
78 year old male diagnosed with stage IV lung cancer receiving chemotherapy.  Patient is status post radiation therapy.  Past medical history includes ulcer, prostate cancer, PVD, MI, hypertension, hyperlipidemia, gout, constipation, CAD, and brain metastases.  Medications include multivitamin, niacin, fish oil, Prilosec, MiraLAX, Metamucil.  Labs include glucose 108 and albumin 3.2 on February 18.  Height: 5 feet 9 inches. Weight: 168 pounds. Usual body weight: 177 pounds in November 2019. BMI: 24.81.  Patient reports his appetite is good and he thinks he is eating normally. He is confused as to why he might have lost weight. He has occasionally consumed a protein shake but does not do so regularly. Patient denies nausea, vomiting, constipation, and diarrhea. Nutrition focused physical exam deferred as patient was having his chemotherapy started at the time of discussion.  Nutrition diagnosis: Unintended weight loss related to metastatic lung cancer as evidenced by 5% weight loss over 3 months.  Intervention: Patient educated to consume regular meals and add snacks consisting of high-calorie, high-protein foods in between meals . Provided patient with fact sheet. Recommended patient consume 1 Ensure Enlive daily providing 350 cal and 20 g protein. Provided coupons. Reviewed specific snacks patient can add to his diet. Questions were answered.  Teach back method used.  Patient encouraged to try to maintain weight.  Monitoring, evaluation, goals: Patient will increase oral intake to promote weight maintenance.  Next visit: March 10 during infusion.  **Disclaimer: This note was dictated with voice recognition software. Similar sounding words can inadvertently be transcribed and this note may contain transcription errors which may not have been corrected upon publication of note.**

## 2018-11-16 ENCOUNTER — Telehealth: Payer: Self-pay | Admitting: Medical Oncology

## 2018-11-16 ENCOUNTER — Other Ambulatory Visit: Payer: Self-pay | Admitting: Medical Oncology

## 2018-11-16 DIAGNOSIS — C3491 Malignant neoplasm of unspecified part of right bronchus or lung: Secondary | ICD-10-CM

## 2018-11-16 NOTE — Telephone Encounter (Signed)
Constipation- ."I am experiencing severe bowel movement problems today. Straining yesterday and today to have BM . LBM  2/19 . Denies abdominal pain but has pain in rectum, takes Miralax or metamucil daily and vegetable laxative daily .  11/10/2018-chemo with emend and aloxi.PEr Julien Nordmann I instructed pt to take mag citrate today start with 1/2 bottle. IF no results in 1 hour take the rest of bottle.

## 2018-11-16 NOTE — Telephone Encounter (Addendum)
Constipation-F/U  Pt stated he had a small amount of stool evacuated and he can feel stool in his rectum . Per Julien Nordmann I instructed pt to take fleets enema and I instructed pt to go to ED if symptoms worsen.

## 2018-11-17 ENCOUNTER — Inpatient Hospital Stay (HOSPITAL_BASED_OUTPATIENT_CLINIC_OR_DEPARTMENT_OTHER): Payer: Medicare Other | Admitting: Medical

## 2018-11-17 ENCOUNTER — Encounter: Payer: Self-pay | Admitting: Medical

## 2018-11-17 ENCOUNTER — Inpatient Hospital Stay: Payer: Medicare Other

## 2018-11-17 VITALS — BP 135/62 | HR 74 | Temp 98.0°F | Resp 18 | Ht 69.0 in | Wt 166.7 lb

## 2018-11-17 DIAGNOSIS — Z5112 Encounter for antineoplastic immunotherapy: Secondary | ICD-10-CM | POA: Diagnosis not present

## 2018-11-17 DIAGNOSIS — C7970 Secondary malignant neoplasm of unspecified adrenal gland: Secondary | ICD-10-CM | POA: Diagnosis not present

## 2018-11-17 DIAGNOSIS — C7931 Secondary malignant neoplasm of brain: Secondary | ICD-10-CM | POA: Diagnosis not present

## 2018-11-17 DIAGNOSIS — C7951 Secondary malignant neoplasm of bone: Secondary | ICD-10-CM

## 2018-11-17 DIAGNOSIS — Z87891 Personal history of nicotine dependence: Secondary | ICD-10-CM

## 2018-11-17 DIAGNOSIS — Z95828 Presence of other vascular implants and grafts: Secondary | ICD-10-CM

## 2018-11-17 DIAGNOSIS — C3431 Malignant neoplasm of lower lobe, right bronchus or lung: Secondary | ICD-10-CM | POA: Diagnosis not present

## 2018-11-17 DIAGNOSIS — K5900 Constipation, unspecified: Secondary | ICD-10-CM

## 2018-11-17 DIAGNOSIS — C3491 Malignant neoplasm of unspecified part of right bronchus or lung: Secondary | ICD-10-CM

## 2018-11-17 DIAGNOSIS — I10 Essential (primary) hypertension: Secondary | ICD-10-CM

## 2018-11-17 DIAGNOSIS — Z7982 Long term (current) use of aspirin: Secondary | ICD-10-CM

## 2018-11-17 DIAGNOSIS — Z79899 Other long term (current) drug therapy: Secondary | ICD-10-CM

## 2018-11-17 DIAGNOSIS — E860 Dehydration: Secondary | ICD-10-CM

## 2018-11-17 DIAGNOSIS — D701 Agranulocytosis secondary to cancer chemotherapy: Secondary | ICD-10-CM

## 2018-11-17 DIAGNOSIS — D696 Thrombocytopenia, unspecified: Secondary | ICD-10-CM

## 2018-11-17 DIAGNOSIS — Z8546 Personal history of malignant neoplasm of prostate: Secondary | ICD-10-CM

## 2018-11-17 LAB — CBC WITH DIFFERENTIAL (CANCER CENTER ONLY)
Abs Immature Granulocytes: 0.01 10*3/uL (ref 0.00–0.07)
Basophils Absolute: 0 10*3/uL (ref 0.0–0.1)
Basophils Relative: 0 %
EOS ABS: 0 10*3/uL (ref 0.0–0.5)
Eosinophils Relative: 0 %
HCT: 25.2 % — ABNORMAL LOW (ref 39.0–52.0)
Hemoglobin: 8.5 g/dL — ABNORMAL LOW (ref 13.0–17.0)
Immature Granulocytes: 0 %
Lymphocytes Relative: 22 %
Lymphs Abs: 0.7 10*3/uL (ref 0.7–4.0)
MCH: 32.4 pg (ref 26.0–34.0)
MCHC: 33.7 g/dL (ref 30.0–36.0)
MCV: 96.2 fL (ref 80.0–100.0)
Monocytes Absolute: 0.1 10*3/uL (ref 0.1–1.0)
Monocytes Relative: 2 %
Neutro Abs: 2.4 10*3/uL (ref 1.7–7.7)
Neutrophils Relative %: 76 %
Platelet Count: 141 10*3/uL — ABNORMAL LOW (ref 150–400)
RBC: 2.62 MIL/uL — ABNORMAL LOW (ref 4.22–5.81)
RDW: 15.4 % (ref 11.5–15.5)
WBC Count: 3.2 10*3/uL — ABNORMAL LOW (ref 4.0–10.5)
nRBC: 0 % (ref 0.0–0.2)

## 2018-11-17 LAB — CMP (CANCER CENTER ONLY)
ALT: 14 U/L (ref 0–44)
ANION GAP: 10 (ref 5–15)
AST: 16 U/L (ref 15–41)
Albumin: 3.3 g/dL — ABNORMAL LOW (ref 3.5–5.0)
Alkaline Phosphatase: 104 U/L (ref 38–126)
BUN: 32 mg/dL — ABNORMAL HIGH (ref 8–23)
CO2: 26 mmol/L (ref 22–32)
Calcium: 9.2 mg/dL (ref 8.9–10.3)
Chloride: 100 mmol/L (ref 98–111)
Creatinine: 0.94 mg/dL (ref 0.61–1.24)
GFR, Est AFR Am: >60 mL/min (ref >60)
GFR, Estimated: >60 mL/min (ref >60)
Glucose, Bld: 102 mg/dL — ABNORMAL HIGH (ref 70–99)
Potassium: 4.1 mmol/L (ref 3.5–5.1)
Sodium: 136 mmol/L (ref 135–145)
Total Bilirubin: 0.9 mg/dL (ref 0.3–1.2)
Total Protein: 6.7 g/dL (ref 6.5–8.1)

## 2018-11-17 MED ORDER — SODIUM CHLORIDE 0.9% FLUSH
10.0000 mL | Freq: Once | INTRAVENOUS | Status: AC
  Administered 2018-11-17: 10 mL
  Filled 2018-11-17: qty 10

## 2018-11-17 MED ORDER — ALTEPLASE 2 MG IJ SOLR
2.0000 mg | Freq: Once | INTRAMUSCULAR | Status: AC
  Administered 2018-11-17: 2 mg
  Filled 2018-11-17: qty 2

## 2018-11-17 MED ORDER — HEPARIN SOD (PORK) LOCK FLUSH 100 UNIT/ML IV SOLN
500.0000 [IU] | Freq: Once | INTRAVENOUS | Status: AC
  Administered 2018-11-17: 500 [IU]
  Filled 2018-11-17: qty 5

## 2018-11-17 MED ORDER — ALTEPLASE 2 MG IJ SOLR
INTRAMUSCULAR | Status: AC
  Filled 2018-11-17: qty 2

## 2018-11-17 MED ORDER — HEPARIN SOD (PORK) LOCK FLUSH 100 UNIT/ML IV SOLN
500.0000 [IU] | Freq: Once | INTRAVENOUS | Status: DC
  Filled 2018-11-17: qty 5

## 2018-11-17 MED ORDER — SODIUM CHLORIDE 0.9 % IV SOLN
INTRAVENOUS | Status: AC
  Administered 2018-11-17: 13:00:00 via INTRAVENOUS
  Filled 2018-11-17 (×2): qty 250

## 2018-11-17 NOTE — Patient Instructions (Addendum)
Constipation Management  Colace, 1 to 2 tablets twice daily  MiraLAX 17 grams in 8 ounces of liquids 1 to 2 times daily as needed   Remember to remain well hydrated. Drink, Drink, Drink non-caffeinated beverages.   Adjust these medications based on your response. If your bowel movements become too loose then decrease the amount of Senna-S and/or MiraLAX that you are using. If your bowel movements become too firm or are difficult to pass, the increase the amount of Senna-S and/or MiraLAX that you are using and increase your intake of water.    Rehydration, Adult Rehydration is the replacement of body fluids and salts and minerals (electrolytes) that are lost during dehydration. Dehydration is when there is not enough fluid or water in the body. This happens when you lose more fluids than you take in. Common causes of dehydration include:  Vomiting.  Diarrhea.  Excessive sweating, such as from heat exposure or exercise.  Taking medicines that cause the body to lose excess fluid (diuretics).  Impaired kidney function.  Not drinking enough fluid.  Certain illnesses or infections.  Certain poorly controlled long-term (chronic) illnesses, such as diabetes, heart disease, and kidney disease.  Symptoms of mild dehydration may include thirst, dry lips and mouth, dry skin, and dizziness. Symptoms of severe dehydration may include increased heart rate, confusion, fainting, and not urinating. You can rehydrate by drinking certain fluids or getting fluids through an IV tube, as told by your health care provider. What are the risks? Generally, rehydration is safe. However, one problem that can happen is taking in too much fluid (overhydration). This is rare. If overhydration happens, it can cause an electrolyte imbalance, kidney failure, or a decrease in salt (sodium) levels in the body. How to rehydrate Follow instructions from your health care provider for rehydration. The kind of fluid you  should drink and the amount you should drink depend on your condition.  If directed by your health care provider, drink an oral rehydration solution (ORS). This is a drink designed to treat dehydration that is found in pharmacies and retail stores. ? Make an ORS by following instructions on the package. ? Start by drinking small amounts, about  cup (120 mL) every 5-10 minutes. ? Slowly increase how much you drink until you have taken the amount recommended by your health care provider.  Drink enough clear fluids to keep your urine clear or pale yellow. If you were instructed to drink an ORS, finish the ORS first, then start slowly drinking other clear fluids. Drink fluids such as: ? Water. Do not drink only water. Doing that can lead to having too little sodium in your body (hyponatremia). ? Ice chips. ? Fruit juice that you have added water to (diluted juice). ? Low-calorie sports drinks.  If you are severely dehydrated, your health care provider may recommend that you receive fluids through an IV tube in the hospital.  Do not take sodium tablets. Doing that can lead to the condition of having too much sodium in your body (hypernatremia). Eating while you rehydrate Follow instructions from your health care provider about what to eat while you rehydrate. Your health care provider may recommend that you slowly begin eating regular foods in small amounts.  Eat foods that contain a healthy balance of electrolytes, such as bananas, oranges, potatoes, tomatoes, and spinach.  Avoid foods that are greasy or contain a lot of fat or sugar.  In some cases, you may get nutrition through a feeding tube that is  passed through your nose and into your stomach (nasogastric tube, or NG tube). This may be done if you have uncontrolled vomiting or diarrhea. Beverages to avoid Certain beverages may make dehydration worse. While you rehydrate, avoid:  Alcohol.  Caffeine.  Drinks that contain a lot of  sugar. These include: ? High-calorie sports drinks. ? Fruit juice that is not diluted. ? Soda.  Check nutrition labels to see how much sugar or caffeine a beverage contains. Signs of dehydration recovery You may be recovering from dehydration if:  You are urinating more often than before you started rehydrating.  Your urine is clear or pale yellow.  Your energy level improves.  You vomit less frequently.  You have diarrhea less frequently.  Your appetite improves or returns to normal.  You feel less dizzy or less light-headed.  Your skin tone and color start to look more normal. Contact a health care provider if:  You continue to have symptoms of mild dehydration, such as: ? Thirst. ? Dry lips. ? Slightly dry mouth. ? Dry, warm skin. ? Dizziness.  You continue to vomit or have diarrhea. Get help right away if:  You have symptoms of dehydration that get worse.  You feel: ? Confused. ? Weak. ? Like you are going to faint.  You have not urinated in 6-8 hours.  You have very dark urine.  You have trouble breathing.  Your heart rate while sitting still is over 100 beats a minute.  You cannot drink fluids without vomiting.  You have vomiting or diarrhea that: ? Gets worse. ? Does not go away.  You have a fever. This information is not intended to replace advice given to you by your health care provider. Make sure you discuss any questions you have with your health care provider. Document Released: 12/02/2011 Document Revised: 03/29/2016 Document Reviewed: 11/03/2015 Elsevier Interactive Patient Education  2019 [employer].

## 2018-11-20 NOTE — Progress Notes (Signed)
[facility] Progress Note   Tony [name] [id number] 1941/06/10 78 y.o.    BRODEN HOLT is managed by Dr. Fanny Bien. [name]  Actively treated with chemotherapy/immunotherapy/hormonal therapy: yes  Current therapy: Carboplatin, Keytruda, and Alimta  Last Treated: 11/10/2018 (cycle 3, day 1)  Next scheduled appointment with provider: 12/01/2018  Assessment: Plan:    Dehydration - Plan: 0.9 %  sodium chloride infusion  Port-A-Cath in place - Plan: heparin lock flush 100 unit/mL  Adenocarcinoma, lung, right ([place]) - Plan: heparin lock flush 100 unit/mL   Dehydration: The patient presents to the clinic today with a report that he feels dehydrated.  His labs returned showing a BUN elevated at 32.  He was given 1 L of normal saline IV.  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  He will be seen by Dr. Julien Nordmann in follow-up on 12/01/2018.  Constipation: Please see the patient's discharge instructions regarding management of constipation.  Please see After Visit Summary for patient specific instructions.  Future Appointments  Date Time Provider [place]  11/24/2018 10:30 AM [facility] LAB 1 [facility] None  11/24/2018 11:30 AM [facility] [place] FLUSH [facility] None  11/30/2018 10:30 AM [facility] 2 [facility] [place]  12/01/2018  8:00 AM [facility] LAB ONLY [facility] None  12/01/2018  8:15 AM [facility] [place] FLUSH [facility] None  12/01/2018  8:30 AM Heilingoetter, Cassandra L, PA-C [facility] None  12/01/2018 10:00 AM [facility] INFUSION [facility] None  12/01/2018 12:00 PM Ernestene Kiel L, RD [facility] None  12/03/2018 12:00 PM [facility] 1 [facility]  12/08/2018 11:00 AM [facility] LAB 4 [facility] None  12/08/2018 11:15 AM [facility] [place] FLUSH [facility] None  12/15/2018 11:00 AM [facility] ONLY [facility] None  12/15/2018 11:15 AM [facility]  [place] FLUSH [facility] None  12/22/2018 10:15 AM [facility] ONLY [facility] None  12/22/2018 10:30 AM [facility] [place] FLUSH [facility] None  12/22/2018 11:00 AM Curt Bears, MD [facility] None  12/22/2018 12:00 PM [facility] INFUSION [facility] None  12/29/2018 11:00 AM [facility] LAB 1 [facility] None  12/29/2018 11:15 AM [facility] FLUSH [facility] None  01/05/2019 11:00 AM [facility] LAB 3 [facility] None  01/05/2019 11:15 AM [facility] [place] FLUSH [facility] None  01/12/2019 10:45 AM [facility] LAB 4 [facility] None  01/12/2019 11:00 AM [facility] [place] FLUSH [facility] None  01/12/2019 11:30 AM Heilingoetter, Cassandra L, PA-C [facility] None  01/12/2019 12:30 PM [facility] INFUSION [facility] None  04/07/2019  9:15 AM Hayden Pedro, MD [facility] None    No orders of the defined types were placed in this encounter.      Subjective:   Patient ID:  Tony [name] is a 78 y.o. (DOB August 02, 1941) male.  Chief Complaint: No chief complaint on file.   HPI Tony [name] is a 78 year old male with a history of a metastatic adenocarcinoma of the lung.  He has brain and bone metastasis.  He is managed by Dr. Julien Nordmann.  He is status post cycle 3, day 1 of carboplatin, Keytruda, and Alimta which was dosed on 11/10/2018.  He contacted our office yesterday stating that he was having significant constipation.  He had been using multiple agents including MiraLAX, Colace, and Metamucil without benefit.  He has not been drinking much liquids.  He did use an enema yesterday and was able to have a large bowel movement.  He denies fevers, chills, sweats, nausea, or vomiting.  Medications: I have reviewed the patient's current medications.  Allergies: No Known Allergies  Past Medical History:  Diagnosis Date  . Anemia   . Brain metastases ([place])   . CAD (coronary artery disease)   . Constipation   . Gout   . Heart disease   . History of blood transfusion   . History of radiation therapy  09/01/2018   brain, cerebella (5 sites)/ 20 Gy in 1 fraction.  [place] Kitchen HLD (hyperlipidemia) 10/13/2018  . HTN (hypertension) 10/13/2018  . Hyperlipidemia   . Hypertension   . Lung cancer ([place])   . Mass of lower lobe of right lung   . Myocardial infarction ([place])    MI in 2000  . Peripheral vascular disease ([facility])    Abdominal Aortic Aneurysm  . Prostate cancer [facility]) Nov. 2012  . Ulcer     Past Surgical History:  Procedure Laterality Date  . ANAL FISSURECTOMY    . COLONOSCOPY    . CORONARY ARTERY BYPASS GRAFT     2000  . EYE SURGERY Left Nov. 2013   Cataract  . EYE SURGERY Right Feb. 2014   Cataract  . HERNIA REPAIR    . PORTACATH PLACEMENT Left 09/24/2018   Procedure: INSERTION PORT-A-CATH;  Surgeon: Melrose Nakayama, MD;  Location: [facility];  Service: Thoracic;  Laterality: Left;  . PR VEIN BYPASS GRAFT,AORTO-FEM-POP  2000  . PROSTATE SURGERY  Nov. 2012  . ROBOT ASSISTED LAPAROSCOPIC RADICAL PROSTATECTOMY  08/08/2011   Procedure: ROBOTIC ASSISTED LAPAROSCOPIC RADICAL PROSTATECTOMY LEVEL 2;  Surgeon: Dutch Gray, MD;  [facility];  Service: Urology;  Laterality: Bilateral;  with Bilateral Pelvic Lymphadenectomy   . SHOULDER SURGERY  1980'S  . VIDEO BRONCHOSCOPY WITH ENDOBRONCHIAL ULTRASOUND N/A 08/06/2018   Procedure: VIDEO BRONCHOSCOPY WITH ENDOBRONCHIAL ULTRASOUND;  Surgeon: Melrose Nakayama, MD;  Location: [facility] OR;  Service: Thoracic;  Laterality: N/A;  . WISDOM TOOTH EXTRACTION      Family History  Problem Relation Age of Onset  . Diabetes Mother   . Heart disease Mother        CHF  . Heart disease Father   . Heart failure Father        Amputation- Leg  . Deep vein thrombosis Father   . Heart disease Brother        Heart Disease before age 48  . Heart failure Brother   . Hyperlipidemia Brother   . Hypertension Brother   . Heart disease Sister   . Diabetes Sister   . Heart failure Sister     Social History   Socioeconomic History  . Marital status: Married     Spouse name: Not on file  . Number of children: Not on file  . Years of education: Not on file  . Highest education level: Not on file  Occupational History  . Not on file  Social Needs  . Financial resource strain: Not on file  . Food insecurity:    Worry: Not on file    Inability: Not on file  . Transportation needs:    Medical: Not on file    Non-medical: Not on file  Tobacco Use  . Smoking status: Former Smoker    Packs/day: 1.50    Years: 43.00    Pack years: 64.50    Types: Cigarettes    Last attempt to quit: 09/23/1998    Years since quitting: 20.1  . Smokeless tobacco: Never Used  Substance and Sexual Activity  . Alcohol use: Yes  Alcohol/week: 7.0 standard drinks    Types: 7 Glasses of wine per week    Comment: half glass per day  . Drug use: No  . Sexual activity: Not on file  Lifestyle  . Physical activity:    Days per week: Not on file    Minutes per session: Not on file  . Stress: Not on file  Relationships  . Social connections:    Talks on phone: Not on file    Gets together: Not on file    Attends religious service: Not on file    Active member of club or organization: Not on file    Attends meetings of clubs or organizations: Not on file    Relationship status: Not on file  . Intimate partner violence:    Fear of current or ex partner: Not on file    Emotionally abused: Not on file    Physically abused: Not on file    Forced sexual activity: Not on file  Other Topics Concern  . Not on file  Social History Narrative  . Not on file    Past Medical History, Surgical history, Social history, and Family history were reviewed and updated as appropriate.   Please see review of systems for further details on the patient's review from today.   Review of Systems:  Review of Systems  Constitutional: Negative for appetite change, chills, diaphoresis, fever and unexpected weight change.  HENT: Negative for trouble swallowing and voice change.     Respiratory: Negative for cough, chest tightness, shortness of breath and wheezing.   Cardiovascular: Negative for chest pain and palpitations.  Gastrointestinal: Positive for constipation. Negative for abdominal distention, abdominal pain, anal bleeding, blood in stool, diarrhea, nausea, rectal pain and vomiting.  Genitourinary: Negative for difficulty urinating and dysuria.  Musculoskeletal: Negative for back pain and myalgias.  Neurological: Negative for dizziness, light-headedness and headaches.    Objective:   Physical Exam:  BP 135/62 Comment: retake  Pulse 74   Temp 98 F (36.7 C) (Oral)   Resp 18   Ht 5\' 9"  (1.753 m)   Wt 166 lb 11.2 oz (75.6 kg)   SpO2 100%   BMI 24.62 kg/m  ECOG: 1  Physical Exam Constitutional:      General: He is not in acute distress.    Appearance: He is not diaphoretic.  HENT:     Head: Normocephalic and atraumatic.  Cardiovascular:     Rate and Rhythm: Normal rate and regular rhythm.     Heart sounds: Normal heart sounds. No murmur. No friction rub. No gallop.   Pulmonary:     Effort: Pulmonary effort is normal. No respiratory distress.     Breath sounds: Normal breath sounds. No wheezing or rales.  Abdominal:     General: Bowel sounds are normal. There is no distension.     Palpations: Abdomen is soft. There is no mass.     Tenderness: There is no abdominal tenderness. There is no guarding or rebound.  Skin:    General: Skin is warm and dry.  Neurological:     Mental Status: He is alert.     Coordination: Coordination normal.     Gait: Gait normal.     Lab Review:     Component Value Date/Time   NA 136 11/17/2018 1200   NA 139 08/10/2012 1328   K 4.1 11/17/2018 1200   K 4.1 08/10/2012 1328   CL 100 11/17/2018 1200   CL 105 08/10/2012 1328  CO2 26 11/17/2018 1200   CO2 29 08/10/2012 1328   GLUCOSE 102 (H) 11/17/2018 1200   GLUCOSE 112 (H) 08/10/2012 1328   BUN 32 (H) 11/17/2018 1200   BUN 18.0 08/10/2012 1328    CREATININE 0.94 11/17/2018 1200   CREATININE 1.1 08/10/2012 1328   CALCIUM 9.2 11/17/2018 1200   CALCIUM 9.0 08/10/2012 1328   PROT 6.7 11/17/2018 1200   PROT 6.1 (L) 08/10/2012 1328   ALBUMIN 3.3 (L) 11/17/2018 1200   ALBUMIN 3.2 (L) 08/10/2012 1328   AST 16 11/17/2018 1200   AST 42 (H) 08/10/2012 1328   ALT 14 11/17/2018 1200   ALT 55 08/10/2012 1328   ALKPHOS 104 11/17/2018 1200   ALKPHOS 144 08/10/2012 1328   BILITOT 0.9 11/17/2018 1200   BILITOT 0.91 08/10/2012 1328   GFRNONAA >60 11/17/2018 1200   GFRAA >60 11/17/2018 1200       Component Value Date/Time   WBC 3.2 (L) 11/17/2018 1200   WBC 1.2 (LL) 10/14/2018 0448   RBC 2.62 (L) 11/17/2018 1200   HGB 8.5 (L) 11/17/2018 1200   HGB 14.7 05/11/2013 1235   HCT 25.2 (L) 11/17/2018 1200   HCT 42.5 05/11/2013 1235   PLT 141 (L) 11/17/2018 1200   PLT 131 (L) 05/11/2013 1235   MCV 96.2 11/17/2018 1200   MCV 100.7 (H) 05/11/2013 1235   MCH 32.4 11/17/2018 1200   MCHC 33.7 11/17/2018 1200   RDW 15.4 11/17/2018 1200   RDW 12.8 05/11/2013 1235   LYMPHSABS 0.7 11/17/2018 1200   LYMPHSABS 2.1 05/11/2013 1235   MONOABS 0.1 11/17/2018 1200   MONOABS 0.5 05/11/2013 1235   EOSABS 0.0 11/17/2018 1200   EOSABS 0.1 05/11/2013 1235   BASOSABS 0.0 11/17/2018 1200   BASOSABS 0.1 05/11/2013 1235   -------------------------------  Imaging from last 24 hours (if applicable):  Radiology interpretation: No results found.      This case was discussed with Dr. Julien Nordmann. He expressed agreement with my management of this patient.

## 2018-11-23 ENCOUNTER — Other Ambulatory Visit: Payer: Self-pay | Admitting: Medical Oncology

## 2018-11-23 DIAGNOSIS — C3491 Malignant neoplasm of unspecified part of right bronchus or lung: Secondary | ICD-10-CM

## 2018-11-24 ENCOUNTER — Inpatient Hospital Stay: Payer: Medicare Other | Attending: Oncology

## 2018-11-24 ENCOUNTER — Other Ambulatory Visit: Payer: Self-pay | Admitting: Medical Oncology

## 2018-11-24 ENCOUNTER — Other Ambulatory Visit: Payer: Self-pay | Admitting: *Deleted

## 2018-11-24 ENCOUNTER — Telehealth: Payer: Self-pay | Admitting: Medical Oncology

## 2018-11-24 ENCOUNTER — Encounter: Payer: Self-pay | Admitting: *Deleted

## 2018-11-24 ENCOUNTER — Inpatient Hospital Stay: Payer: Medicare Other

## 2018-11-24 ENCOUNTER — Other Ambulatory Visit: Payer: Self-pay | Admitting: Physician Assistant

## 2018-11-24 VITALS — BP 156/51 | HR 73 | Temp 97.8°F | Resp 18

## 2018-11-24 DIAGNOSIS — D696 Thrombocytopenia, unspecified: Secondary | ICD-10-CM | POA: Diagnosis not present

## 2018-11-24 DIAGNOSIS — C7951 Secondary malignant neoplasm of bone: Secondary | ICD-10-CM | POA: Diagnosis not present

## 2018-11-24 DIAGNOSIS — C3491 Malignant neoplasm of unspecified part of right bronchus or lung: Secondary | ICD-10-CM

## 2018-11-24 DIAGNOSIS — Z5111 Encounter for antineoplastic chemotherapy: Secondary | ICD-10-CM | POA: Insufficient documentation

## 2018-11-24 DIAGNOSIS — D509 Iron deficiency anemia, unspecified: Secondary | ICD-10-CM

## 2018-11-24 DIAGNOSIS — I10 Essential (primary) hypertension: Secondary | ICD-10-CM | POA: Insufficient documentation

## 2018-11-24 DIAGNOSIS — Z79899 Other long term (current) drug therapy: Secondary | ICD-10-CM | POA: Diagnosis not present

## 2018-11-24 DIAGNOSIS — Z95828 Presence of other vascular implants and grafts: Secondary | ICD-10-CM

## 2018-11-24 DIAGNOSIS — D708 Other neutropenia: Secondary | ICD-10-CM | POA: Diagnosis not present

## 2018-11-24 DIAGNOSIS — L02213 Cutaneous abscess of chest wall: Secondary | ICD-10-CM | POA: Insufficient documentation

## 2018-11-24 DIAGNOSIS — I2510 Atherosclerotic heart disease of native coronary artery without angina pectoris: Secondary | ICD-10-CM | POA: Insufficient documentation

## 2018-11-24 DIAGNOSIS — C771 Secondary and unspecified malignant neoplasm of intrathoracic lymph nodes: Secondary | ICD-10-CM | POA: Insufficient documentation

## 2018-11-24 DIAGNOSIS — D649 Anemia, unspecified: Secondary | ICD-10-CM | POA: Diagnosis not present

## 2018-11-24 DIAGNOSIS — D702 Other drug-induced agranulocytosis: Secondary | ICD-10-CM

## 2018-11-24 DIAGNOSIS — E860 Dehydration: Secondary | ICD-10-CM | POA: Insufficient documentation

## 2018-11-24 DIAGNOSIS — Z5112 Encounter for antineoplastic immunotherapy: Secondary | ICD-10-CM | POA: Diagnosis not present

## 2018-11-24 DIAGNOSIS — C3431 Malignant neoplasm of lower lobe, right bronchus or lung: Secondary | ICD-10-CM | POA: Insufficient documentation

## 2018-11-24 DIAGNOSIS — Z87891 Personal history of nicotine dependence: Secondary | ICD-10-CM | POA: Insufficient documentation

## 2018-11-24 DIAGNOSIS — C7931 Secondary malignant neoplasm of brain: Secondary | ICD-10-CM | POA: Diagnosis not present

## 2018-11-24 DIAGNOSIS — C7970 Secondary malignant neoplasm of unspecified adrenal gland: Secondary | ICD-10-CM | POA: Insufficient documentation

## 2018-11-24 LAB — CBC WITH DIFFERENTIAL (CANCER CENTER ONLY)
Abs Immature Granulocytes: 0 10*3/uL (ref 0.00–0.07)
Band Neutrophils: 1 %
Basophils Absolute: 0 10*3/uL (ref 0.0–0.1)
Basophils Relative: 0 %
EOS ABS: 0 10*3/uL (ref 0.0–0.5)
Eosinophils Relative: 1 %
HCT: 21.2 % — ABNORMAL LOW (ref 39.0–52.0)
Hemoglobin: 7.1 g/dL — ABNORMAL LOW (ref 13.0–17.0)
Lymphocytes Relative: 75 %
Lymphs Abs: 1.4 10*3/uL (ref 0.7–4.0)
MCH: 32.6 pg (ref 26.0–34.0)
MCHC: 33.5 g/dL (ref 30.0–36.0)
MCV: 97.2 fL (ref 80.0–100.0)
Monocytes Absolute: 0.1 10*3/uL (ref 0.1–1.0)
Monocytes Relative: 5 %
Neutro Abs: 0.4 10*3/uL — CL (ref 1.7–17.7)
Neutrophils Relative %: 18 %
Platelet Count: 34 10*3/uL — ABNORMAL LOW (ref 150–400)
RBC: 2.18 MIL/uL — ABNORMAL LOW (ref 4.22–5.81)
RDW: 14.7 % (ref 11.5–15.5)
WBC: 1.9 10*3/uL — AB (ref 4.0–10.5)
nRBC: 1.1 % — ABNORMAL HIGH (ref 0.0–0.2)

## 2018-11-24 LAB — CMP (CANCER CENTER ONLY)
ALBUMIN: 3.3 g/dL — AB (ref 3.5–5.0)
ALT: 27 U/L (ref 0–44)
AST: 20 U/L (ref 15–41)
Alkaline Phosphatase: 110 U/L (ref 38–126)
Anion gap: 10 (ref 5–15)
BILIRUBIN TOTAL: 0.4 mg/dL (ref 0.3–1.2)
BUN: 21 mg/dL (ref 8–23)
CO2: 25 mmol/L (ref 22–32)
Calcium: 9.1 mg/dL (ref 8.9–10.3)
Chloride: 103 mmol/L (ref 98–111)
Creatinine: 0.94 mg/dL (ref 0.61–1.24)
GFR, Est AFR Am: >60 mL/min (ref >60)
Glucose, Bld: 118 mg/dL — ABNORMAL HIGH (ref 70–99)
Potassium: 4.2 mmol/L (ref 3.5–5.1)
Sodium: 138 mmol/L (ref 135–145)
Total Protein: 6.8 g/dL (ref 6.5–8.1)

## 2018-11-24 LAB — PREPARE RBC (CROSSMATCH)

## 2018-11-24 LAB — ABO/RH: ABO/RH(D): O POS

## 2018-11-24 MED ORDER — HEPARIN SOD (PORK) LOCK FLUSH 100 UNIT/ML IV SOLN
500.0000 [IU] | Freq: Once | INTRAVENOUS | Status: AC
  Administered 2018-11-24: 500 [IU]
  Filled 2018-11-24: qty 5

## 2018-11-24 MED ORDER — SODIUM CHLORIDE 0.9% FLUSH
10.0000 mL | Freq: Once | INTRAVENOUS | Status: AC
  Administered 2018-11-24: 10 mL
  Filled 2018-11-24: qty 10

## 2018-11-24 MED ORDER — TBO-FILGRASTIM 300 MCG/0.5ML ~~LOC~~ SOSY
PREFILLED_SYRINGE | SUBCUTANEOUS | Status: AC
  Filled 2018-11-24: qty 0.5

## 2018-11-24 MED ORDER — TBO-FILGRASTIM 300 MCG/0.5ML ~~LOC~~ SOSY: 300.0000 ug | PREFILLED_SYRINGE | Freq: Once | SUBCUTANEOUS | Status: DC

## 2018-11-24 MED ORDER — FILGRASTIM-SNDZ 300 MCG/0.5ML IJ SOSY
300.0000 ug | PREFILLED_SYRINGE | Freq: Once | INTRAMUSCULAR | Status: AC
  Administered 2018-11-24: 300 ug via SUBCUTANEOUS
  Filled 2018-11-24: qty 0.5

## 2018-11-24 NOTE — Telephone Encounter (Signed)
Abnormal labs- pt notified of abnormal anc and hgb and I told him someone will call him back with appts for injection and blood transfusion. Mingo Amber  Notified.

## 2018-11-25 ENCOUNTER — Inpatient Hospital Stay: Payer: Medicare Other

## 2018-11-25 VITALS — BP 138/54 | HR 68 | Temp 98.1°F | Resp 16

## 2018-11-25 DIAGNOSIS — Z5112 Encounter for antineoplastic immunotherapy: Secondary | ICD-10-CM | POA: Diagnosis not present

## 2018-11-25 DIAGNOSIS — D509 Iron deficiency anemia, unspecified: Secondary | ICD-10-CM

## 2018-11-25 DIAGNOSIS — C3491 Malignant neoplasm of unspecified part of right bronchus or lung: Secondary | ICD-10-CM

## 2018-11-25 DIAGNOSIS — Z95828 Presence of other vascular implants and grafts: Secondary | ICD-10-CM

## 2018-11-25 MED ORDER — DIPHENHYDRAMINE HCL 25 MG PO CAPS
ORAL_CAPSULE | ORAL | Status: AC
  Filled 2018-11-25: qty 1

## 2018-11-25 MED ORDER — SODIUM CHLORIDE 0.9% FLUSH
10.0000 mL | Freq: Once | INTRAVENOUS | Status: AC
  Administered 2018-11-25: 10 mL
  Filled 2018-11-25: qty 10

## 2018-11-25 MED ORDER — DIPHENHYDRAMINE HCL 25 MG PO CAPS
25.0000 mg | ORAL_CAPSULE | Freq: Once | ORAL | Status: AC
  Administered 2018-11-25: 25 mg via ORAL

## 2018-11-25 MED ORDER — FILGRASTIM-SNDZ 300 MCG/0.5ML IJ SOSY
300.0000 ug | PREFILLED_SYRINGE | Freq: Once | INTRAMUSCULAR | Status: AC
  Administered 2018-11-25: 300 ug via SUBCUTANEOUS
  Filled 2018-11-25: qty 0.5

## 2018-11-25 MED ORDER — HEPARIN SOD (PORK) LOCK FLUSH 100 UNIT/ML IV SOLN
500.0000 [IU] | Freq: Once | INTRAVENOUS | Status: AC
  Administered 2018-11-25: 500 [IU]
  Filled 2018-11-25: qty 5

## 2018-11-25 MED ORDER — SODIUM CHLORIDE 0.9% IV SOLUTION
250.0000 mL | Freq: Once | INTRAVENOUS | Status: AC
  Administered 2018-11-25: 250 mL via INTRAVENOUS
  Filled 2018-11-25: qty 250

## 2018-11-25 MED ORDER — ACETAMINOPHEN 325 MG PO TABS
650.0000 mg | ORAL_TABLET | Freq: Once | ORAL | Status: AC
  Administered 2018-11-25: 650 mg via ORAL

## 2018-11-25 MED ORDER — ACETAMINOPHEN 325 MG PO TABS
ORAL_TABLET | ORAL | Status: AC
  Filled 2018-11-25: qty 2

## 2018-11-25 NOTE — Patient Instructions (Signed)
Blood Transfusion, Adult, Care After This sheet gives you information about how to care for yourself after your procedure. Your doctor may also give you more specific instructions. If you have problems or questions, contact your doctor. Follow these instructions at home:   Take over-the-counter and prescription medicines only as told by your doctor.  Go back to your normal activities as told by your doctor.  Follow instructions from your doctor about how to take care of the area where an IV tube was put into your vein (insertion site). Make sure you: ? Wash your hands with soap and water before you change your bandage (dressing). If there is no soap and water, use hand sanitizer. ? Change your bandage as told by your doctor.  Check your IV insertion site every day for signs of infection. Check for: ? More redness, swelling, or pain. ? More fluid or blood. ? Warmth. ? Pus or a bad smell. Contact a doctor if:  You have more redness, swelling, or pain around the IV insertion site.  You have more fluid or blood coming from the IV insertion site.  Your IV insertion site feels warm to the touch.  You have pus or a bad smell coming from the IV insertion site.  Your pee (urine) turns pink, red, or brown.  You feel weak after doing your normal activities. Get help right away if:  You have signs of a serious allergic or body defense (immune) system reaction, including: ? Itchiness. ? Hives. ? Trouble breathing. ? Anxiety. ? Pain in your chest or lower back. ? Fever, flushing, and chills. ? Fast pulse. ? Rash. ? Watery poop (diarrhea). ? Throwing up (vomiting). ? Dark pee. ? Serious headache. ? Dizziness. ? Stiff neck. ? Yellow color in your face or the white parts of your eyes (jaundice). Summary  After a blood transfusion, return to your normal activities as told by your doctor.  Every day, check for signs of infection where the IV tube was put into your vein.  Some  signs of infection are warm skin, more redness and pain, more fluid or blood, and pus or a bad smell where the needle went in.  Contact your doctor if you feel weak or have any unusual symptoms. This information is not intended to replace advice given to you by your health care provider. Make sure you discuss any questions you have with your health care provider. Document Released: 09/30/2014 Document Revised: 05/03/2016 Document Reviewed: 05/03/2016 Elsevier Interactive Patient Education  2019 [employer]  

## 2018-11-26 LAB — TYPE AND SCREEN
ABO/RH(D): O POS
ANTIBODY SCREEN: NEGATIVE
Unit division: 0
Unit division: 0

## 2018-11-26 LAB — BPAM RBC
BLOOD PRODUCT EXPIRATION DATE: 202003312359
Blood Product Expiration Date: 202003312359
ISSUE DATE / TIME: 202003041354
ISSUE DATE / TIME: 202003041354
Unit Type and Rh: 5100
Unit Type and Rh: 5100

## 2018-11-30 ENCOUNTER — Other Ambulatory Visit: Payer: Self-pay | Admitting: Medical Oncology

## 2018-11-30 ENCOUNTER — Inpatient Hospital Stay (HOSPITAL_BASED_OUTPATIENT_CLINIC_OR_DEPARTMENT_OTHER): Payer: Medicare Other | Admitting: Medical

## 2018-11-30 ENCOUNTER — Encounter (HOSPITAL_COMMUNITY): Payer: Self-pay

## 2018-11-30 ENCOUNTER — Ambulatory Visit (HOSPITAL_COMMUNITY)
Admission: RE | Admit: 2018-11-30 | Discharge: 2018-11-30 | Disposition: A | Discharge status: Home / Self Care | Payer: Medicare Other | Source: Ambulatory Visit | Attending: Internal Medicine | Admitting: Internal Medicine

## 2018-11-30 VITALS — BP 149/79 | HR 69 | Temp 97.8°F | Resp 18 | Ht 69.0 in | Wt 168.5 lb

## 2018-11-30 DIAGNOSIS — C3490 Malignant neoplasm of unspecified part of unspecified bronchus or lung: Secondary | ICD-10-CM

## 2018-11-30 DIAGNOSIS — C3431 Malignant neoplasm of lower lobe, right bronchus or lung: Secondary | ICD-10-CM

## 2018-11-30 DIAGNOSIS — C7931 Secondary malignant neoplasm of brain: Secondary | ICD-10-CM | POA: Diagnosis not present

## 2018-11-30 DIAGNOSIS — Z5112 Encounter for antineoplastic immunotherapy: Secondary | ICD-10-CM | POA: Diagnosis not present

## 2018-11-30 DIAGNOSIS — L02213 Cutaneous abscess of chest wall: Secondary | ICD-10-CM | POA: Diagnosis not present

## 2018-11-30 DIAGNOSIS — C3491 Malignant neoplasm of unspecified part of right bronchus or lung: Secondary | ICD-10-CM

## 2018-11-30 DIAGNOSIS — C7951 Secondary malignant neoplasm of bone: Secondary | ICD-10-CM | POA: Diagnosis not present

## 2018-11-30 DIAGNOSIS — Z87891 Personal history of nicotine dependence: Secondary | ICD-10-CM

## 2018-11-30 DIAGNOSIS — C7970 Secondary malignant neoplasm of unspecified adrenal gland: Secondary | ICD-10-CM

## 2018-11-30 MED ORDER — MUPIROCIN 2 % EX OINT: 1.0000 "application " | TOPICAL_OINTMENT | Freq: Three times a day (TID) | CUTANEOUS | 0 refills | Status: DC

## 2018-11-30 MED ORDER — HEPARIN SOD (PORK) LOCK FLUSH 100 UNIT/ML IV SOLN
500.0000 [IU] | Freq: Once | INTRAVENOUS | Status: AC
  Administered 2018-11-30: 500 [IU] via INTRAVENOUS

## 2018-11-30 MED ORDER — SULFAMETHOXAZOLE-TRIMETHOPRIM 800-160 MG PO TABS: 1.0000 | ORAL_TABLET | Freq: Two times a day (BID) | ORAL | 0 refills | Status: DC

## 2018-11-30 MED ORDER — IOHEXOL 300 MG/ML  SOLN
100.0000 mL | Freq: Once | INTRAMUSCULAR | Status: AC | PRN
  Administered 2018-11-30: 100 mL via INTRAVENOUS

## 2018-11-30 MED ORDER — HEPARIN SOD (PORK) LOCK FLUSH 100 UNIT/ML IV SOLN
INTRAVENOUS | Status: AC
  Filled 2018-11-30: qty 5

## 2018-11-30 MED ORDER — SODIUM CHLORIDE (PF) 0.9 % IJ SOLN
INTRAMUSCULAR | Status: AC
  Filled 2018-11-30: qty 50

## 2018-11-30 NOTE — Progress Notes (Signed)
Pt presents today for yellow pus filled pocket above port where incision was made for placement.  Minor swelling with redness seen, no drainage or bleeding.  Pt denies pain, itching, tenderness, or recent injury.  Afebrile.  Denies F/C or N/V/D.  A&Ox4.

## 2018-11-30 NOTE — Patient Instructions (Signed)

## 2018-12-01 ENCOUNTER — Inpatient Hospital Stay: Payer: Medicare Other

## 2018-12-01 ENCOUNTER — Other Ambulatory Visit: Payer: Self-pay

## 2018-12-01 ENCOUNTER — Inpatient Hospital Stay: Payer: Medicare Other | Admitting: Physician Assistant

## 2018-12-01 ENCOUNTER — Inpatient Hospital Stay: Payer: Medicare Other | Admitting: Nutrition

## 2018-12-01 ENCOUNTER — Encounter: Payer: Self-pay | Admitting: Physician Assistant

## 2018-12-01 VITALS — BP 127/63 | HR 62 | Temp 98.0°F | Resp 18 | Ht 69.0 in | Wt 167.3 lb

## 2018-12-01 DIAGNOSIS — C7951 Secondary malignant neoplasm of bone: Secondary | ICD-10-CM | POA: Diagnosis not present

## 2018-12-01 DIAGNOSIS — Z5112 Encounter for antineoplastic immunotherapy: Secondary | ICD-10-CM

## 2018-12-01 DIAGNOSIS — Z95828 Presence of other vascular implants and grafts: Secondary | ICD-10-CM

## 2018-12-01 DIAGNOSIS — C3431 Malignant neoplasm of lower lobe, right bronchus or lung: Secondary | ICD-10-CM

## 2018-12-01 DIAGNOSIS — C3491 Malignant neoplasm of unspecified part of right bronchus or lung: Secondary | ICD-10-CM

## 2018-12-01 DIAGNOSIS — C7931 Secondary malignant neoplasm of brain: Secondary | ICD-10-CM | POA: Diagnosis not present

## 2018-12-01 DIAGNOSIS — C771 Secondary and unspecified malignant neoplasm of intrathoracic lymph nodes: Secondary | ICD-10-CM | POA: Diagnosis not present

## 2018-12-01 DIAGNOSIS — Z5111 Encounter for antineoplastic chemotherapy: Secondary | ICD-10-CM

## 2018-12-01 LAB — CBC WITH DIFFERENTIAL (CANCER CENTER ONLY)
Abs Immature Granulocytes: 0.71 10*3/uL — ABNORMAL HIGH (ref 0.00–0.07)
Basophils Absolute: 0 10*3/uL (ref 0.0–0.1)
Basophils Relative: 0 %
Eosinophils Absolute: 0 10*3/uL (ref 0.0–0.5)
Eosinophils Relative: 0 %
HCT: 31.2 % — ABNORMAL LOW (ref 39.0–52.0)
HEMOGLOBIN: 10.1 g/dL — AB (ref 13.0–17.0)
Immature Granulocytes: 9 %
Lymphocytes Relative: 15 %
Lymphs Abs: 1.2 10*3/uL (ref 0.7–4.0)
MCH: 31.3 pg (ref 26.0–34.0)
MCHC: 32.4 g/dL (ref 30.0–36.0)
MCV: 96.6 fL (ref 80.0–100.0)
MONO ABS: 1.3 10*3/uL — AB (ref 0.1–1.0)
MONOS PCT: 16 %
Neutro Abs: 4.8 10*3/uL (ref 1.7–7.7)
Neutrophils Relative %: 60 %
Platelet Count: 140 10*3/uL — ABNORMAL LOW (ref 150–400)
RBC: 3.23 MIL/uL — ABNORMAL LOW (ref 4.22–5.81)
RDW: 17.5 % — ABNORMAL HIGH (ref 11.5–15.5)
WBC Count: 8.1 10*3/uL (ref 4.0–10.5)
nRBC: 0.4 % — ABNORMAL HIGH (ref 0.0–0.2)

## 2018-12-01 LAB — CMP (CANCER CENTER ONLY)
ALT: 27 U/L (ref 0–44)
AST: 38 U/L (ref 15–41)
Albumin: 3.3 g/dL — ABNORMAL LOW (ref 3.5–5.0)
Alkaline Phosphatase: 119 U/L (ref 38–126)
Anion gap: 13 (ref 5–15)
BUN: 14 mg/dL (ref 8–23)
CO2: 23 mmol/L (ref 22–32)
Calcium: 9.2 mg/dL (ref 8.9–10.3)
Chloride: 100 mmol/L (ref 98–111)
Creatinine: 1.15 mg/dL (ref 0.61–1.24)
GFR, Est AFR Am: >60 mL/min (ref >60)
GFR, Estimated: >60 mL/min (ref >60)
GLUCOSE: 120 mg/dL — AB (ref 70–99)
Potassium: 4.5 mmol/L (ref 3.5–5.1)
Sodium: 136 mmol/L (ref 135–145)
Total Bilirubin: 0.4 mg/dL (ref 0.3–1.2)
Total Protein: 7.3 g/dL (ref 6.5–8.1)

## 2018-12-01 MED ORDER — PALONOSETRON HCL INJECTION 0.25 MG/5ML
INTRAVENOUS | Status: AC
  Filled 2018-12-01: qty 5

## 2018-12-01 MED ORDER — SODIUM CHLORIDE 0.9% FLUSH
10.0000 mL | Freq: Once | INTRAVENOUS | Status: DC
  Filled 2018-12-01: qty 10

## 2018-12-01 MED ORDER — SODIUM CHLORIDE 0.9 % IV SOLN
405.0000 mg/m2 | Freq: Once | INTRAVENOUS | Status: AC
  Administered 2018-12-01: 800 mg via INTRAVENOUS
  Filled 2018-12-01: qty 20

## 2018-12-01 MED ORDER — SODIUM CHLORIDE 0.9 % IV SOLN
337.2000 mg | Freq: Once | INTRAVENOUS | Status: AC
  Administered 2018-12-01: 340 mg via INTRAVENOUS
  Filled 2018-12-01: qty 34

## 2018-12-01 MED ORDER — SODIUM CHLORIDE 0.9 % IV SOLN
200.0000 mg | Freq: Once | INTRAVENOUS | Status: AC
  Administered 2018-12-01: 200 mg via INTRAVENOUS
  Filled 2018-12-01: qty 8

## 2018-12-01 MED ORDER — PALONOSETRON HCL INJECTION 0.25 MG/5ML
0.2500 mg | Freq: Once | INTRAVENOUS | Status: AC
  Administered 2018-12-01: 0.25 mg via INTRAVENOUS

## 2018-12-01 MED ORDER — SODIUM CHLORIDE 0.9 % IV SOLN
Freq: Once | INTRAVENOUS | Status: AC
  Administered 2018-12-01: 10:00:00 via INTRAVENOUS
  Filled 2018-12-01: qty 250

## 2018-12-01 MED ORDER — SODIUM CHLORIDE 0.9 % IV SOLN
Freq: Once | INTRAVENOUS | Status: AC
  Administered 2018-12-01: 11:00:00 via INTRAVENOUS
  Filled 2018-12-01: qty 5

## 2018-12-01 NOTE — Progress Notes (Signed)
[facility] OFFICE PROGRESS NOTE  [name], MD [place] [address]  DIAGNOSIS: Stage IV non-small cell lung cancer, adenocarcinoma who presented with a large right lower lobe lung mass in addition to metastatic disease to the mediastinal lymph nodes, adrenal glands, and bone involving the right shoulder,lumbar spines, and pelvic bone.  Molecular Studies by [facility]:Showed no actionable mutations.  PRIOR THERAPY: 1)palliative radiation to the right neck and shoulder.He is scheduled to have [place] to the brain lesions on 09/01/2018.  CURRENT THERAPY: 1)palliative systemic chemotherapy with carboplatin for an AUC of 5, Alimta 500 mg/m, and Keytruda 200 mg IV every 3 weeks. First dose September 30, 2018.  Status post 3 cycles.  INTERVAL HISTORY: Tony [name] 78 y.o. male returns clinic for a follow-up visit accompanied by his wife. The patient is feeling fairly well today without any concerning complaints. He recently was found to be neutropenic, thrombocytopenic, and anemic on routine labs. He received 2 units of RBCs and filgrastin. He is feeling better since receiving those interventions. Additionally, he has been seen in the symptom management clinic regarding dehydration and minor erythema near his port-a-cath. He denies associated fevers, tenderness, flatulence, or bleeding. He was given a prescription for Bactrim and Bactroban. He has not used the Bactroban due to his wife being unable to tolerate the odor.   He continues to tolerate treatment well without any adverse side effects.  He denies any fever, chills, night sweats, weight loss.  He denies any chest pain, shortness of breath, cough, or hemoptysis.  Denies any nausea, vomiting, or diarrhea. He previously had severe constipation; however, this is improved since modifying his bowel regimen. He denies any headache or visual changes. He denies bleeding such as melena, hematochezia,  hematuria, or epistaxis. He denies any rashes or skin changes with the exception of the erythema near his port-a-cath.  He recently had a restaging CT scan and is here for evaluation to discuss the scan results.   MEDICAL HISTORY: Past Medical History:  Diagnosis Date  . Anemia   . Brain metastases ([facility])   . CAD (coronary artery disease)   . Constipation   . Gout   . Heart disease   . History of blood transfusion   . History of radiation therapy 09/01/2018   brain, cerebella (5 sites)/ 20 Gy in 1 fraction.  Marland Kitchen HLD (hyperlipidemia) 10/13/2018  . HTN (hypertension) 10/13/2018  . Hyperlipidemia   . Hypertension   . Lung cancer ([facility])   . Mass of lower lobe of right lung   . Myocardial infarction ([facility])    MI in 2000  . Peripheral vascular disease ([facility])    Abdominal Aortic Aneurysm  . Prostate cancer [facility] Nov. 2012  . Ulcer     ALLERGIES:  has No Known Allergies.  MEDICATIONS:  Current Outpatient Medications  Medication Sig Dispense Refill  . acetaminophen (TYLENOL) 650 MG CR tablet Take 650 mg by mouth daily as needed for pain.     Marland Kitchen aspirin 81 MG tablet Take 81 mg by mouth every evening.     . colchicine 0.6 MG tablet Take 0.6 mg by mouth daily.    . folic acid (FOLVITE) 518 MCG tablet Take 800 mcg by mouth daily.     . metoprolol tartrate (LOPRESSOR) 50 MG tablet Take 50 mg by mouth 2 (two) times daily.    . Multiple Vitamins-Minerals (MULTIVITAMIN WITH MINERALS) tablet Take 1 tablet by mouth daily.    Marland Kitchen  niacin 500 MG tablet Take 500 mg by mouth 2 (two) times daily.    . Omega-3 Fatty Acids (FISH OIL) 1200 MG CAPS Take 1,200 mg by mouth daily.     Marland Kitchen omeprazole (PRILOSEC OTC) 20 MG tablet Take 10 mg by mouth 2 (two) times a week.    . polyethylene glycol (MIRALAX / GLYCOLAX) packet Take 17 g by mouth 4 (four) times a week.     . pravastatin (PRAVACHOL) 10 MG tablet Take 10 mg by mouth every evening.     . Psyllium (METAMUCIL FIBER PO) Take 1 Dose by mouth 3 (three) times a  week.     . ramipril (ALTACE) 10 MG capsule Take 10 mg by mouth every morning.     . sulfamethoxazole-trimethoprim (BACTRIM DS,SEPTRA DS) 800-160 MG tablet Take 1 tablet by mouth 2 (two) times daily. 14 tablet 0  . triamterene-hydrochlorothiazide (MAXZIDE-25) 37.5-25 MG tablet Take 0.5 tablets by mouth every morning.     . mupirocin ointment (BACTROBAN) 2 % Place 1 application into the nose 3 (three) times daily. Place over area 3 times daily (Patient not taking: Reported on 12/01/2018) 30 g 0   No current facility-administered medications for this visit.     SURGICAL HISTORY:  Past Surgical History:  Procedure Laterality Date  . ANAL FISSURECTOMY    . COLONOSCOPY    . CORONARY ARTERY BYPASS GRAFT     2000  . EYE SURGERY Left Nov. 2013   Cataract  . EYE SURGERY Right Feb. 2014   Cataract  . HERNIA REPAIR    . PORTACATH PLACEMENT Left 09/24/2018   Procedure: INSERTION PORT-A-CATH;  Surgeon: [place] Nakayama, MD;  Location: [facility];  Service: Thoracic;  Laterality: Left;  . PR VEIN BYPASS GRAFT,AORTO-FEM-POP  2000  . PROSTATE SURGERY  Nov. 2012  . ROBOT ASSISTED LAPAROSCOPIC RADICAL PROSTATECTOMY  08/08/2011   Procedure: ROBOTIC ASSISTED LAPAROSCOPIC RADICAL PROSTATECTOMY LEVEL 2;  Surgeon: Dutch Gray, MD;  Location: [facility];  Service: Urology;  Laterality: Bilateral;  with Bilateral Pelvic Lymphadenectomy   . SHOULDER SURGERY  1980'S  . VIDEO BRONCHOSCOPY WITH ENDOBRONCHIAL ULTRASOUND N/A 08/06/2018   Procedure: VIDEO BRONCHOSCOPY WITH ENDOBRONCHIAL ULTRASOUND;  Surgeon: Mel[place] Nakayama, MD;  Location: [facility];  Service: Thoracic;  Laterality: N/A;  . WISDOM TOOTH EXTRACTION      REVIEW OF SYSTEMS:   Review of Systems  Constitutional: Negative for appetite change, chills, fatigue, fever and unexpected weight change.  HENT:   Negative for mouth sores, nosebleeds, sore throat and trouble swallowing.   Eyes: Negative for eye problems and icterus.  Respiratory: Negative for cough,  hemoptysis, shortness of breath and wheezing.   Cardiovascular: Negative for chest pain and leg swelling.  Gastrointestinal: Negative for abdominal pain, constipation, diarrhea, nausea and vomiting.  Genitourinary: Negative for bladder incontinence, difficulty urinating, dysuria, frequency and hematuria.   Musculoskeletal: Negative for back pain, gait problem, neck pain and neck stiffness.  Skin: Negative for itching and rash.  Neurological: Negative for dizziness, extremity weakness, gait problem, headaches, light-headedness and seizures.  Hematological: Negative for adenopathy. Does not bruise/bleed easily.  Psychiatric/Behavioral: Negative for confusion, depression and sleep disturbance. The patient is not nervous/anxious.     PHYSICAL EXAMINATION:  Blood pressure 127/63, pulse 62, temperature 98 F (36.7 C), temperature source Oral, resp. rate 18, height 5\' 9"  (1.753 m), weight 167 lb 4.8 oz (75.9 kg), SpO2 100 %.  ECOG PERFORMANCE STATUS: 1 - Symptomatic but completely ambulatory  Physical Exam  Constitutional:  Oriented to person, place, and time and well-developed, well-nourished, and in no distress. No distress.  HENT:  Head: Normocephalic and atraumatic.  Mouth/Throat: Oropharynx is clear and moist. No oropharyngeal exudate.  Eyes: Conjunctivae are normal. Right eye exhibits no discharge. Left eye exhibits no discharge. No scleral icterus.  Neck: Normal range of motion. Neck supple.  Cardiovascular: Normal rate, regular rhythm, normal heart sounds and intact distal pulses.   Pulmonary/Chest: Effort normal and breath sounds normal. No respiratory distress. No wheezes. No rales.  Abdominal: Soft. Bowel sounds are normal. Exhibits no distension and no mass. There is no tenderness.  Musculoskeletal: Normal range of motion. Exhibits no edema.  Lymphadenopathy:    No cervical adenopathy.  Neurological: Alert and oriented to person, place, and time. Exhibits normal muscle tone. Gait  normal. Coordination normal.  Skin: Mild erythema over port a cath (left side of chest). No pus, no flatulence, non-tender. Skin is warm and dry. Not diaphoretic. No pallor. No rashes.  Psychiatric: Mood, memory and judgment normal.  Vitals reviewed.  LABORATORY DATA: Lab Results  Component Value Date   WBC 8.1 12/01/2018   HGB 10.1 (L) 12/01/2018   HCT 31.2 (L) 12/01/2018   MCV 96.6 12/01/2018   PLT 140 (L) 12/01/2018      Chemistry      Component Value Date/Time   NA 136 12/01/2018 0825   NA 139 08/10/2012 1328   K 4.5 12/01/2018 0825   K 4.1 08/10/2012 1328   CL 100 12/01/2018 0825   CL 105 08/10/2012 1328   CO2 23 12/01/2018 0825   CO2 29 08/10/2012 1328   BUN 14 12/01/2018 0825   BUN 18.0 08/10/2012 1328   CREATININE 1.15 12/01/2018 0825   CREATININE 1.1 08/10/2012 1328      Component Value Date/Time   CALCIUM 9.2 12/01/2018 0825   CALCIUM 9.0 08/10/2012 1328   ALKPHOS 119 12/01/2018 0825   ALKPHOS 144 08/10/2012 1328   AST 38 12/01/2018 0825   AST 42 (H) 08/10/2012 1328   ALT 27 12/01/2018 0825   ALT 55 08/10/2012 1328   BILITOT 0.4 12/01/2018 0825   BILITOT 0.91 08/10/2012 1328       RADIOGRAPHIC STUDIES:  Ct Chest W Contrast  Result Date: 11/30/2018 CLINICAL DATA:  Lung cancer metastatic to brain and bone. Ongoing chemotherapy. Chemotherapy completed in January. History of prostate cancer and intermittent cough. Weight loss and constipation. EXAM: CT CHEST, ABDOMEN, AND PELVIS WITH CONTRAST TECHNIQUE: Multidetector CT imaging of the chest, abdomen and pelvis was performed following the standard protocol during bolus administration of intravenous contrast. CONTRAST:  155mL OMNIPAQUE IOHEXOL 300 MG/ML  SOLN COMPARISON:  PET 07/27/2018, CT chest 07/20/2018 and CT abdomen pelvis 06/12/2011. FINDINGS: CT CHEST FINDINGS Cardiovascular: Left IJ Port-A-Cath terminates in the SVC. Atherosclerotic calcification of the aorta. Heart size normal. No pericardial effusion.  Pulmonary arteries are enlarged. Mediastinum/Nodes: No pathologically enlarged mediastinal lymph nodes. Previously measured subcarinal lymph node is no longer seen as a separate structure from a nodule in the right lower lobe. No hilar or axillary adenopathy. Esophagus is unremarkable. Lungs/Pleura: Image quality is degraded by respiratory motion. 2 mm anterior segment right upper lobe nodule (series 6, image 68), as on 07/20/2018. Central right lower lobe mass is contiguous with a previously measured subcarinal lymph node and collectively, measures 2.9 x 3.5 cm (series 6, image 84), previously 3.2 x 3.6 cm. No pleural fluid. Airway is unremarkable. Musculoskeletal: Degenerative changes in the spine. Lytic lesion in associated fracture involving  the glenoid of the right scapula. CT ABDOMEN PELVIS FINDINGS Hepatobiliary: Subcentimeter low-attenuation lesions in the right hepatic lobe are too small to characterize but stable. Liver and gallbladder are otherwise unremarkable. No biliary ductal dilatation. Pancreas: Negative. Spleen: Negative. Adrenals/Urinary Tract: Adrenal glands are unremarkable. Low-attenuation lesions off both kidneys measure up to 3.2 cm on the left and are indicative of cysts. Kidneys are otherwise unremarkable. Ureters are decompressed. Bladder is low in volume. Stomach/Bowel: Stomach and small bowel are unremarkable. An appendicolith is incidentally noted. Appendix and colon are otherwise unremarkable. Vascular/Lymphatic: Atherosclerotic calcification of the aorta. Infrarenal aortic aneurysm measures 3.3 cm. 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 No pathologically enlarged lymph nodes. Reproductive: Prostatectomy. Other: No free fluid.  Mesenteries and peritoneum are unremarkable. Musculoskeletal: Increasingly sclerotic lesions are seen in L1 and the left iliac wing. Associated compression deformity of the inferior endplate of L1. No new lesions. IMPRESSION: 1.  Interval response to therapy as evidenced by decrease in size of the primary right lower lobe mass. Probable residual subcarinal adenopathy but difficult to measure as a discrete lesion. 2. Adrenal glands appear morphologically normal. 3. Increasingly sclerotic L1 and left iliac wing metastases, possibly due to healing. 4. Infrarenal Aortic aneurysm NOS (ICD10-I71.9). Recommend followup by ultrasound in 3 years. This recommendation follows ACR consensus guidelines: White Paper of the ACR Incidental Findings Committee II on Vascular Findings. J Am Coll Radiol 2013; 10:789-794. 5. 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 6.  Aortic atherosclerosis (ICD10-170.0). 7. Enlarged pulmonary arteries, indicative of pulmonary arterial hypertension. Electronically Signed   By: Lorin Picket M.D.   On: 11/30/2018 14:19   Ct Abdomen Pelvis W Contrast  Result Date: 11/30/2018 CLINICAL DATA:  Lung cancer metastatic to brain and bone. Ongoing chemotherapy. Chemotherapy completed in January. History of prostate cancer and intermittent cough. Weight loss and constipation. EXAM: CT CHEST, ABDOMEN, AND PELVIS WITH CONTRAST TECHNIQUE: Multidetector CT imaging of the chest, abdomen and pelvis was performed following the standard protocol during bolus administration of intravenous contrast. CONTRAST:  170mL OMNIPAQUE IOHEXOL 300 MG/ML  SOLN COMPARISON:  PET 07/27/2018, CT chest 07/20/2018 and CT abdomen pelvis 06/12/2011. FINDINGS: CT CHEST FINDINGS Cardiovascular: Left IJ Port-A-Cath terminates in the SVC. Atherosclerotic calcification of the aorta. Heart size normal. No pericardial effusion. Pulmonary arteries are enlarged. Mediastinum/Nodes: No pathologically enlarged mediastinal lymph nodes. Previously measured subcarinal lymph node is no longer seen as a separate structure from a nodule in the right lower lobe. No hilar or axillary adenopathy. Esophagus is unremarkable. Lungs/Pleura: Image quality is  degraded by respiratory motion. 2 mm anterior segment right upper lobe nodule (series 6, image 68), as on 07/20/2018. Central right lower lobe mass is contiguous with a previously measured subcarinal lymph node and collectively, measures 2.9 x 3.5 cm (series 6, image 84), previously 3.2 x 3.6 cm. No pleural fluid. Airway is unremarkable. Musculoskeletal: Degenerative changes in the spine. Lytic lesion in associated fracture involving the glenoid of the right scapula. CT ABDOMEN PELVIS FINDINGS Hepatobiliary: Subcentimeter low-attenuation lesions in the right hepatic lobe are too small to characterize but stable. Liver and gallbladder are otherwise unremarkable. No biliary ductal dilatation. Pancreas: Negative. Spleen: Negative. Adrenals/Urinary Tract: Adrenal glands are unremarkable. Low-attenuation lesions off both kidneys measure up to 3.2 cm on the left and are indicative of cysts. Kidneys are otherwise unremarkable. Ureters are decompressed. Bladder is low in volume. Stomach/Bowel: Stomach and small bowel are unremarkable. An appendicolith is incidentally noted. Appendix and colon are otherwise  unremarkable. Vascular/Lymphatic: Atherosclerotic calcification of the aorta. Infrarenal aortic aneurysm measures 3.3 cm. 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 No pathologically enlarged lymph nodes. Reproductive: Prostatectomy. Other: No free fluid.  Mesenteries and peritoneum are unremarkable. Musculoskeletal: Increasingly sclerotic lesions are seen in L1 and the left iliac wing. Associated compression deformity of the inferior endplate of L1. No new lesions. IMPRESSION: 1. Interval response to therapy as evidenced by decrease in size of the primary right lower lobe mass. Probable residual subcarinal adenopathy but difficult to measure as a discrete lesion. 2. Adrenal glands appear morphologically normal. 3. Increasingly sclerotic L1 and left iliac wing metastases, possibly due to healing. 4.  Infrarenal Aortic aneurysm NOS (ICD10-I71.9). Recommend followup by ultrasound in 3 years. This recommendation follows ACR consensus guidelines: White Paper of the ACR Incidental Findings Committee II on Vascular Findings. J Am Coll Radiol 2013; 10:789-794. 5. 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 6.  Aortic atherosclerosis (ICD10-170.0). 7. Enlarged pulmonary arteries, indicative of pulmonary arterial hypertension. Electronically Signed   By: Lorin Picket M.D.   On: 11/30/2018 14:19     ASSESSMENT/PLAN:  This is a very pleasant 78 year old Caucasian male with stage IV non-small cell lung cancer, adenocarcinoma who presented with a large right lower lobe lung mass in addition to mediastinal lymphadenopathy as well as metastatic disease in the adrenal gland as well as several metastatic bone lesions including the right shoulder, lumbar spine, and pelvic bones.  He was negative for any actionable mutations. He underwent palliative radiotherapy to the right shoulder and painful metastatic bone lesions with relief to his pain.  He currently is undergoing systemic chemotherapy with carboplatin for an AUC of 5, Alimta 500 mg/m,  and Keytruda 200 mg IV every 3 weeks.  He is status post 3 cycles.   The patient was seen with Dr. Julien Nordmann today is tolerating treatment well without any adverse effects except for fatigue.  He recently had a restaging CT scan performed.  Dr. Julien Nordmann personally and independently reviewed the scan results and discussed the results with the patient and his wife.  The scan demonstrated a positive response to therapy. Recommend he proceed with cycle #4 of the same treatment today as scheduled.  I will see him back for evaluation in 3 weeks prior to receiving cycle #5.   The patient was encouraged to continue to drink fluids to avoid dehydration.  He will continue on his current bowel regimen for constipation.  He will continue taking the bactrim for the  erythema around his port. Discussed that it was alright to use neosporin over the port-a-cath if the patient does not wish to use Bactroban.  The patient was advised to call immediately if he has any concerning symptoms in the interval. The patient voices understanding of current disease status and treatment options and is in agreement with the current care plan. All questions were answered. The patient knows to call the clinic with any problems, questions or concerns. We can certainly see the patient much sooner if necessary   Orders Placed This Encounter  Procedures  . TSH    Standing Status:   Standing    Number of Occurrences:   6    Standing Expiration Date:   12/01/2019  . CBC with Differential (Cancer Center Only)    Standing Status:   Standing    Number of Occurrences:   20    Standing Expiration Date:   12/01/2019  . CMP ([facility] only)  Standing Status:   Standing    Number of Occurrences:   20    Standing Expiration Date:   12/01/2019     Tony Sos Mace Weinberg, PA-C 12/01/18  ADDENDUM: Hematology/Oncology Attending: I had a face-to-face encounter with the patient today.  I recommended his care plan.  This is a very pleasant 78 years old white male with metastatic non-small cell lung cancer, adenocarcinoma status post stereotactic radiotherapy to brain metastasis in addition to palliative radiation to the right shoulder and painful bone metastasis.  His pain has significantly improved.  The patient is currently undergoing systemic chemotherapy with carboplatin, Alimta and Keytruda status post 3 cycles.  He has been tolerating this treatment well with no concerning adverse effects. The patient had repeat CT scan of the chest, abdomen and pelvis performed recently.  I personally and independently reviewed his scans and discussed the results with the patient and his wife. His scan showed improvement of his disease. I recommended for the patient to proceed with cycle #4  today. Starting from cycle #5 the patient will be treated with maintenance Alimta and Keytruda only. The patient will come back for follow-up visit in 3 weeks for evaluation before starting cycle #5. He was advised to call immediately if he has any concerning symptoms in the interval.  Disclaimer: This note was dictated with voice recognition software. Similar sounding words can inadvertently be transcribed and may be missed upon review. Eilleen Kempf, MD 12/01/18

## 2018-12-01 NOTE — Patient Instructions (Signed)
[facility] Discharge Instructions for Patients Receiving Chemotherapy  Today you received the following chemotherapy agents Keytruda, Alimta, and Carboplatin  To help prevent nausea and vomiting after your treatment, we encourage you to take your nausea medication as directed.  If you develop nausea and vomiting that is not controlled by your nausea medication, call the clinic.   BELOW ARE SYMPTOMS THAT SHOULD BE REPORTED IMMEDIATELY:  *FEVER GREATER THAN 100.5 F  *CHILLS WITH OR WITHOUT FEVER  NAUSEA AND VOMITING THAT IS NOT CONTROLLED WITH YOUR NAUSEA MEDICATION  *UNUSUAL SHORTNESS OF BREATH  *UNUSUAL BRUISING OR BLEEDING  TENDERNESS IN MOUTH AND THROAT WITH OR WITHOUT PRESENCE OF ULCERS  *URINARY PROBLEMS  *BOWEL PROBLEMS  UNUSUAL RASH Items with * indicate a potential emergency and should be followed up as soon as possible.  Feel free to call the clinic should you have any questions or concerns. The clinic phone number is [phone].  Please show the CHEMO ALERT CARD at check-in to the Emergency Department and triage nurse.   

## 2018-12-01 NOTE — Progress Notes (Signed)
Nutrition follow-up completed with patient during infusion for stage IV lung cancer. Patient reports good appetite and is eating normally. Weight is stable and documented as 167.3 pounds March 10. Reviewed labs and noted glucose 129 albumin 3.3. Reports some constipation but is working on a bowel regimen.  Nutrition diagnosis: Unintended weight loss has stabilized.  Intervention: Educated patient to continue strategies for adequate calorie and protein intake with goal of weight maintenance. Encouraged patient to consume protein shake if his oral intake decreases. Teach back method used.  Monitoring, evaluation, goals: Patient will continue adequate calorie and protein intake for weight maintenance.  Next visit: To be scheduled if needed.  **Disclaimer: This note was dictated with voice recognition software. Similar sounding words can inadvertently be transcribed and this note may contain transcription errors which may not have been corrected upon publication of note.**

## 2018-12-02 ENCOUNTER — Other Ambulatory Visit: Payer: Self-pay | Admitting: Radiation Therapy

## 2018-12-02 NOTE — Progress Notes (Signed)
[facility] Progress Note   Tony [name] [id number] 03/08/1941 78 y.o.  Tony [name] is managed by Dr. Fanny Bien. Tony [name]  Actively treated with chemotherapy/immunotherapy/hormonal therapy: yes  Current therapy: Carboplatin, Keytruda, and Alimta  Last treated: 11/10/2018 (cycle 3, day 1)  Next scheduled appointment with provider: 12/01/2018  Assessment: Plan:    Cutaneous abscess of chest wall - Plan: mupirocin ointment (BACTROBAN) 2 %, sulfamethoxazole-trimethoprim (BACTRIM DS,SEPTRA DS) 800-160 MG tablet  Adenocarcinoma, lung, right ([place])   Small abscess of the lateral edge of a left chest wall Port-A-Cath: The area was cleaned.  It was noted that there was a small 1 to 2 mm opening in the lateral edge of the left chest wall Port-A-Cath.  The patient was given a prescription for Bactroban ointment and Bactrim DS twice daily for 7 days.  Metastatic adenocarcinoma of the lung: The patient will return tomorrow for consideration of cycle 4, day 1 of carboplatin, Keytruda, and Alimta.  He is status post restaging CT scans completed earlier today.  Please see After Visit Summary for patient specific instructions.  Future Appointments  Date Time Provider [place]  12/03/2018 12:00 PM [facility]-MR 1 [facility]MRI [facility]  12/07/2018  7:00 AM [facility]-TUMOR BOARD CONFERENCE [facility] None  12/08/2018 11:00 AM [facility] LAB 4 [facility] None  12/08/2018 11:15 AM [facility] FLUSH [facility] None  12/15/2018 11:00 AM [facility]-MO LAB ONLY [facility] None  12/15/2018 11:15 AM [facility] [place] FLUSH [facility] None  12/22/2018 10:15 AM [facility]MO LAB ONLY [facility] None  12/22/2018 10:30 AM [facility] [place] FLUSH [facility] None  12/22/2018 11:00 AM Curt Bears, MD [facility] None  12/22/2018 12:00 PM [facility] INFUSION [facility] None  12/29/2018 11:00 AM [facility] LAB 1 [facility] None  12/29/2018 11:15 AM [facility] FLUSH [facility] None  01/05/2019 11:00 AM [facility] LAB 3  [facility] None  01/05/2019 11:15 AM [facility] [place] FLUSH [facility] None  01/12/2019 10:45 AM [facility] LAB 4 [facility] None  01/12/2019 11:00 AM [facility] [place] FLUSH [facility] None  01/12/2019 11:30 AM Heilingoetter, Cassandra L, PA-C [facility] None  01/12/2019 12:30 PM [facility] INFUSION [facility] None  04/07/2019  9:15 AM Hayden Pedro, MD TRE-TRE None    No orders of the defined types were placed in this encounter.      Subjective:   Patient ID:  Tony [name] is a 78 y.o. (DOB 28-May-1941) male.  Chief Complaint:  Chief Complaint  Patient presents with  . Wound Check    HPI Tony [name]  is a 78 year old male with a history of a metastatic adenocarcinoma of the lung.  He has brain and bone metastasis.  He is managed by Dr. Julien Nordmann.  He is status post cycle 3, day 1 of carboplatin, Keytruda, and Alimta which was dosed on 11/10/2018.  He presents to the clinic today after having had restaging CT scans completed earlier today.  It was noted that there was a small area of purulent material in the lateral edge of his left chest wall Port-A-Cath.  The patient only noted this after it was indicated to him by the staff and radiology today.  He denies fevers, chills, sweats, or any other issues of concern today.  Medications: I have reviewed the patient's current medications.  Allergies: No Known Allergies  Past Medical History:  Diagnosis Date  . Anemia   . Brain metastases ([place])   . CAD (coronary artery disease)   . Constipation   . Gout   . Heart disease   . History of  blood transfusion   . History of radiation therapy 09/01/2018   brain, cerebella (5 sites)/ 20 Gy in 1 fraction.  Marland Kitchen HLD (hyperlipidemia) 10/13/2018  . HTN (hypertension) 10/13/2018  . Hyperlipidemia   . Hypertension   . Lung cancer ([place])   . Mass of lower lobe of right lung   . Myocardial infarction ([place])    MI in 2000  . Peripheral vascular disease ([facility])    Abdominal Aortic Aneurysm  .  Prostate cancer [facility]) Nov. 2012  . Ulcer     Past Surgical History:  Procedure Laterality Date  . ANAL FISSURECTOMY    . COLONOSCOPY    . CORONARY ARTERY BYPASS GRAFT     2000  . EYE SURGERY Left Nov. 2013   Cataract  . EYE SURGERY Right Feb. 2014   Cataract  . HERNIA REPAIR    . PORTACATH PLACEMENT Left 09/24/2018   Procedure: INSERTION PORT-A-CATH;  Surgeon: Melrose Nakayama, MD;  Location: [facility];  Service: Thoracic;  Laterality: Left;  . PR VEIN BYPASS GRAFT,AORTO-FEM-POP  2000  . PROSTATE SURGERY  Nov. 2012  . ROBOT ASSISTED LAPAROSCOPIC RADICAL PROSTATECTOMY  08/08/2011   Procedure: ROBOTIC ASSISTED LAPAROSCOPIC RADICAL PROSTATECTOMY LEVEL 2;  Surgeon: Dutch Gray, MD;  Location: [facility];  Service: Urology;  Laterality: Bilateral;  with Bilateral Pelvic Lymphadenectomy   . SHOULDER SURGERY  1980'S  . VIDEO BRONCHOSCOPY WITH ENDOBRONCHIAL ULTRASOUND N/A 08/06/2018   Procedure: VIDEO BRONCHOSCOPY WITH ENDOBRONCHIAL ULTRASOUND;  Surgeon: Melrose Nakayama, MD;  Location: [facility];  Service: Thoracic;  Laterality: N/A;  . WISDOM TOOTH EXTRACTION      Family History  Problem Relation Age of Onset  . Diabetes Mother   . Heart disease Mother        CHF  . Heart disease Father   . Heart failure Father        Amputation- Leg  . Deep vein thrombosis Father   . Heart disease Brother        Heart Disease before age 1  . Heart failure Brother   . Hyperlipidemia Brother   . Hypertension Brother   . Heart disease Sister   . Diabetes Sister   . Heart failure Sister     Social History   Socioeconomic History  . Marital status: Married    Spouse name: Not on file  . Number of children: Not on file  . Years of education: Not on file  . Highest education level: Not on file  Occupational History  . Not on file  Social Needs  . Financial resource strain: Not on file  . Food insecurity:    Worry: Not on file    Inability: Not on file  . Transportation needs:    Medical:  Not on file    Non-medical: Not on file  Tobacco Use  . Smoking status: Former Smoker    Packs/day: 1.50    Years: 43.00    Pack years: 64.50    Types: Cigarettes    Last attempt to quit: 09/23/1998    Years since quitting: 20.2  . Smokeless tobacco: Never Used  Substance and Sexual Activity  . Alcohol use: Yes    Alcohol/week: 7.0 standard drinks    Types: 7 Glasses of wine per week    Comment: half glass per day  . Drug use: No  . Sexual activity: Not on file  Lifestyle  . Physical activity:    Days per week: Not on file  Minutes per session: Not on file  . Stress: Not on file  Relationships  . Social connections:    Talks on phone: Not on file    Gets together: Not on file    Attends religious service: Not on file    Active member of club or organization: Not on file    Attends meetings of clubs or organizations: Not on file    Relationship status: Not on file  . Intimate partner violence:    Fear of current or ex partner: Not on file    Emotionally abused: Not on file    Physically abused: Not on file    Forced sexual activity: Not on file  Other Topics Concern  . Not on file  Social History Narrative  . Not on file    Past Medical History, Surgical history, Social history, and Family history were reviewed and updated as appropriate.   Please see review of systems for further details on the patient's review from today.   Review of Systems:  Review of Systems  Constitutional: Negative for chills, diaphoresis and fever.  HENT: Negative for trouble swallowing and voice change.   Respiratory: Negative for cough, chest tightness, shortness of breath and wheezing.   Cardiovascular: Negative for chest pain and palpitations.  Gastrointestinal: Negative for abdominal pain, constipation, diarrhea, nausea and vomiting.  Musculoskeletal: Negative for back pain and myalgias.  Skin: Positive for wound.        small area of purulent material in the lateral edge of his left  chest wall Port-A-Cat  Neurological: Negative for dizziness, light-headedness and headaches.    Objective:   Physical Exam:  BP (!) 149/79 (BP Location: Left Arm, Patient Position: Sitting) Comment: Liza RN is aware  Pulse 69   Temp 97.8 F (36.6 C) (Oral)   Resp 18   Ht 5\' 9"  (1.753 m)   Wt 168 lb 8 oz (76.4 kg)   SpO2 100%   BMI 24.88 kg/m  ECOG: 0  Physical Exam Constitutional:      General: He is not in acute distress.    Appearance: Normal appearance. He is not ill-appearing, toxic-appearing or diaphoretic.  HENT:     Head: Normocephalic and atraumatic.  Cardiovascular:     Rate and Rhythm: Normal rate and regular rhythm.     Heart sounds: No murmur. No friction rub. No gallop.   Pulmonary:     Effort: Pulmonary effort is normal. No respiratory distress.     Breath sounds: Normal breath sounds. No wheezing or rales.  Skin:    General: Skin is warm and dry.     Comments: Small 1 to 2 mm opening in the lateral edge of the left chest wall Port-A-Cath with granulomatous tissue noted with scant purulent material noted after removal of the granulomatous tissue.  Neurological:     Mental Status: He is alert.     Lab Review:     Component Value Date/Time   NA 136 12/01/2018 0825   NA 139 08/10/2012 1328   K 4.5 12/01/2018 0825   K 4.1 08/10/2012 1328   CL 100 12/01/2018 0825   CL 105 08/10/2012 1328   CO2 23 12/01/2018 0825   CO2 29 08/10/2012 1328   GLUCOSE 120 (H) 12/01/2018 0825   GLUCOSE 112 (H) 08/10/2012 1328   BUN 14 12/01/2018 0825   BUN 18.0 08/10/2012 1328   CREATININE 1.15 12/01/2018 0825   CREATININE 1.1 08/10/2012 1328   CALCIUM 9.2 12/01/2018 0825  CALCIUM 9.0 08/10/2012 1328   PROT 7.3 12/01/2018 0825   PROT 6.1 (L) 08/10/2012 1328   ALBUMIN 3.3 (L) 12/01/2018 0825   ALBUMIN 3.2 (L) 08/10/2012 1328   AST 38 12/01/2018 0825   AST 42 (H) 08/10/2012 1328   ALT 27 12/01/2018 0825   ALT 55 08/10/2012 1328   ALKPHOS 119 12/01/2018 0825    ALKPHOS 144 08/10/2012 1328   BILITOT 0.4 12/01/2018 0825   BILITOT 0.91 08/10/2012 1328   GFRNONAA >60 12/01/2018 0825   GFRAA >60 12/01/2018 0825       Component Value Date/Time   WBC 8.1 12/01/2018 0825   WBC 1.2 (LL) 10/14/2018 0448   RBC 3.23 (L) 12/01/2018 0825   HGB 10.1 (L) 12/01/2018 0825   HGB 14.7 05/11/2013 1235   HCT 31.2 (L) 12/01/2018 0825   HCT 42.5 05/11/2013 1235   PLT 140 (L) 12/01/2018 0825   PLT 131 (L) 05/11/2013 1235   MCV 96.6 12/01/2018 0825   MCV 100.7 (H) 05/11/2013 1235   MCH 31.3 12/01/2018 0825   MCHC 32.4 12/01/2018 0825   RDW 17.5 (H) 12/01/2018 0825   RDW 12.8 05/11/2013 1235   LYMPHSABS 1.2 12/01/2018 0825   LYMPHSABS 2.1 05/11/2013 1235   MONOABS 1.3 (H) 12/01/2018 0825   MONOABS 0.5 05/11/2013 1235   EOSABS 0.0 12/01/2018 0825   EOSABS 0.1 05/11/2013 1235   BASOSABS 0.0 12/01/2018 0825   BASOSABS 0.1 05/11/2013 1235   -------------------------------  Imaging from last 24 hours (if applicable):  Radiology interpretation: Ct Chest W Contrast  Result Date: 11/30/2018 CLINICAL DATA:  Lung cancer metastatic to brain and bone. Ongoing chemotherapy. Chemotherapy completed in January. History of prostate cancer and intermittent cough. Weight loss and constipation. EXAM: CT CHEST, ABDOMEN, AND PELVIS WITH CONTRAST TECHNIQUE: Multidetector CT imaging of the chest, abdomen and pelvis was performed following the standard protocol during bolus administration of intravenous contrast. CONTRAST:  151mL OMNIPAQUE IOHEXOL 300 MG/ML  SOLN COMPARISON:  PET 07/27/2018, CT chest 07/20/2018 and CT abdomen pelvis 06/12/2011. FINDINGS: CT CHEST FINDINGS Cardiovascular: Left IJ Port-A-Cath terminates in the SVC. Atherosclerotic calcification of the aorta. Heart size normal. No pericardial effusion. Pulmonary arteries are enlarged. Mediastinum/Nodes: No pathologically enlarged mediastinal lymph nodes. Previously measured subcarinal lymph node is no longer seen as a  separate structure from a nodule in the right lower lobe. No hilar or axillary adenopathy. Esophagus is unremarkable. Lungs/Pleura: Image quality is degraded by respiratory motion. 2 mm anterior segment right upper lobe nodule (series 6, image 68), as on 07/20/2018. Central right lower lobe mass is contiguous with a previously measured subcarinal lymph node and collectively, measures 2.9 x 3.5 cm (series 6, image 84), previously 3.2 x 3.6 cm. No pleural fluid. Airway is unremarkable. Musculoskeletal: Degenerative changes in the spine. Lytic lesion in associated fracture involving the glenoid of the right scapula. CT ABDOMEN PELVIS FINDINGS Hepatobiliary: Subcentimeter low-attenuation lesions in the right hepatic lobe are too small to characterize but stable. Liver and gallbladder are otherwise unremarkable. No biliary ductal dilatation. Pancreas: Negative. Spleen: Negative. Adrenals/Urinary Tract: Adrenal glands are unremarkable. Low-attenuation lesions off both kidneys measure up to 3.2 cm on the left and are indicative of cysts. Kidneys are otherwise unremarkable. Ureters are decompressed. Bladder is low in volume. Stomach/Bowel: Stomach and small bowel are unremarkable. An appendicolith is incidentally noted. Appendix and colon are otherwise unremarkable. Vascular/Lymphatic: Atherosclerotic calcification of the aorta. Infrarenal aortic aneurysm measures 3.3 cm. 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 No  pathologically enlarged lymph nodes. Reproductive: Prostatectomy. Other: No free fluid.  Mesenteries and peritoneum are unremarkable. Musculoskeletal: Increasingly sclerotic lesions are seen in L1 and the left iliac wing. Associated compression deformity of the inferior endplate of L1. No new lesions. IMPRESSION: 1. Interval response to therapy as evidenced by decrease in size of the primary right lower lobe mass. Probable residual subcarinal adenopathy but difficult to measure as a  discrete lesion. 2. Adrenal glands appear morphologically normal. 3. Increasingly sclerotic L1 and left iliac wing metastases, possibly due to healing. 4. Infrarenal Aortic aneurysm NOS (ICD10-I71.9). Recommend followup by ultrasound in 3 years. This recommendation follows ACR consensus guidelines: White Paper of the ACR Incidental Findings Committee II on Vascular Findings. J Am Coll Radiol 2013; 10:789-794. 5. 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 6.  Aortic atherosclerosis (ICD10-170.0). 7. Enlarged pulmonary arteries, indicative of pulmonary arterial hypertension. Electronically Signed   By: Lorin Picket M.D.   On: 11/30/2018 14:19   Ct Abdomen Pelvis W Contrast  Result Date: 11/30/2018 CLINICAL DATA:  Lung cancer metastatic to brain and bone. Ongoing chemotherapy. Chemotherapy completed in January. History of prostate cancer and intermittent cough. Weight loss and constipation. EXAM: CT CHEST, ABDOMEN, AND PELVIS WITH CONTRAST TECHNIQUE: Multidetector CT imaging of the chest, abdomen and pelvis was performed following the standard protocol during bolus administration of intravenous contrast. CONTRAST:  147mL OMNIPAQUE IOHEXOL 300 MG/ML  SOLN COMPARISON:  PET 07/27/2018, CT chest 07/20/2018 and CT abdomen pelvis 06/12/2011. FINDINGS: CT CHEST FINDINGS Cardiovascular: Left IJ Port-A-Cath terminates in the SVC. Atherosclerotic calcification of the aorta. Heart size normal. No pericardial effusion. Pulmonary arteries are enlarged. Mediastinum/Nodes: No pathologically enlarged mediastinal lymph nodes. Previously measured subcarinal lymph node is no longer seen as a separate structure from a nodule in the right lower lobe. No hilar or axillary adenopathy. Esophagus is unremarkable. Lungs/Pleura: Image quality is degraded by respiratory motion. 2 mm anterior segment right upper lobe nodule (series 6, image 68), as on 07/20/2018. Central right lower lobe mass is contiguous with a  previously measured subcarinal lymph node and collectively, measures 2.9 x 3.5 cm (series 6, image 84), previously 3.2 x 3.6 cm. No pleural fluid. Airway is unremarkable. Musculoskeletal: Degenerative changes in the spine. Lytic lesion in associated fracture involving the glenoid of the right scapula. CT ABDOMEN PELVIS FINDINGS Hepatobiliary: Subcentimeter low-attenuation lesions in the right hepatic lobe are too small to characterize but stable. Liver and gallbladder are otherwise unremarkable. No biliary ductal dilatation. Pancreas: Negative. Spleen: Negative. Adrenals/Urinary Tract: Adrenal glands are unremarkable. Low-attenuation lesions off both kidneys measure up to 3.2 cm on the left and are indicative of cysts. Kidneys are otherwise unremarkable. Ureters are decompressed. Bladder is low in volume. Stomach/Bowel: Stomach and small bowel are unremarkable. An appendicolith is incidentally noted. Appendix and colon are otherwise unremarkable. Vascular/Lymphatic: Atherosclerotic calcification of the aorta. Infrarenal aortic aneurysm measures 3.3 cm. 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 No pathologically enlarged lymph nodes. Reproductive: Prostatectomy. Other: No free fluid.  Mesenteries and peritoneum are unremarkable. Musculoskeletal: Increasingly sclerotic lesions are seen in L1 and the left iliac wing. Associated compression deformity of the inferior endplate of L1. No new lesions. IMPRESSION: 1. Interval response to therapy as evidenced by decrease in size of the primary right lower lobe mass. Probable residual subcarinal adenopathy but difficult to measure as a discrete lesion. 2. Adrenal glands appear morphologically normal. 3. Increasingly sclerotic L1 and left iliac wing metastases, possibly due to healing. 4. Infrarenal  Aortic aneurysm NOS (ICD10-I71.9). Recommend followup by ultrasound in 3 years. This recommendation follows ACR consensus guidelines: White Paper of the ACR  Incidental Findings Committee II on Vascular Findings. J Am Coll Radiol 2013; 10:789-794. 5. 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 6.  Aortic atherosclerosis (ICD10-170.0). 7. Enlarged pulmonary arteries, indicative of pulmonary arterial hypertension. Electronically Signed   By: Lorin Picket M.D.   On: 11/30/2018 14:19

## 2018-12-03 ENCOUNTER — Ambulatory Visit (HOSPITAL_COMMUNITY)
Admission: RE | Admit: 2018-12-03 | Discharge: 2018-12-03 | Disposition: A | Discharge status: Home / Self Care | Payer: Medicare Other | Source: Ambulatory Visit | Attending: Radiation Oncology | Admitting: Radiation Oncology

## 2018-12-03 ENCOUNTER — Other Ambulatory Visit: Payer: Self-pay

## 2018-12-03 DIAGNOSIS — C7931 Secondary malignant neoplasm of brain: Secondary | ICD-10-CM | POA: Insufficient documentation

## 2018-12-03 DIAGNOSIS — C7949 Secondary malignant neoplasm of other parts of nervous system: Secondary | ICD-10-CM | POA: Diagnosis present

## 2018-12-03 MED ORDER — GADOBUTROL 1 MMOL/ML IV SOLN
7.5000 mL | Freq: Once | INTRAVENOUS | Status: AC | PRN
  Administered 2018-12-03: 7.5 mL via INTRAVENOUS

## 2018-12-08 ENCOUNTER — Other Ambulatory Visit: Payer: Self-pay

## 2018-12-08 ENCOUNTER — Inpatient Hospital Stay: Payer: Medicare Other

## 2018-12-08 DIAGNOSIS — C3491 Malignant neoplasm of unspecified part of right bronchus or lung: Secondary | ICD-10-CM

## 2018-12-08 DIAGNOSIS — Z95828 Presence of other vascular implants and grafts: Secondary | ICD-10-CM

## 2018-12-08 DIAGNOSIS — Z5112 Encounter for antineoplastic immunotherapy: Secondary | ICD-10-CM | POA: Diagnosis not present

## 2018-12-08 LAB — CMP (CANCER CENTER ONLY)
ALT: 25 U/L (ref 0–44)
AST: 19 U/L (ref 15–41)
Albumin: 3.3 g/dL — ABNORMAL LOW (ref 3.5–5.0)
Alkaline Phosphatase: 97 U/L (ref 38–126)
Anion gap: 11 (ref 5–15)
BUN: 32 mg/dL — ABNORMAL HIGH (ref 8–23)
CO2: 20 mmol/L — ABNORMAL LOW (ref 22–32)
CREATININE: 1.08 mg/dL (ref 0.61–1.24)
Calcium: 9.1 mg/dL (ref 8.9–10.3)
Chloride: 101 mmol/L (ref 98–111)
Glucose, Bld: 102 mg/dL — ABNORMAL HIGH (ref 70–99)
Potassium: 4.6 mmol/L (ref 3.5–5.1)
Sodium: 132 mmol/L — ABNORMAL LOW (ref 135–145)
Total Bilirubin: 0.7 mg/dL (ref 0.3–1.2)
Total Protein: 6.7 g/dL (ref 6.5–8.1)

## 2018-12-08 LAB — CBC WITH DIFFERENTIAL (CANCER CENTER ONLY)
Abs Immature Granulocytes: 0.01 10*3/uL (ref 0.00–0.07)
Basophils Absolute: 0 10*3/uL (ref 0.0–0.1)
Basophils Relative: 0 %
EOS PCT: 0 %
Eosinophils Absolute: 0 10*3/uL (ref 0.0–0.5)
HCT: 28.2 % — ABNORMAL LOW (ref 39.0–52.0)
Hemoglobin: 9.6 g/dL — ABNORMAL LOW (ref 13.0–17.0)
Immature Granulocytes: 0 %
Lymphocytes Relative: 29 %
Lymphs Abs: 0.8 10*3/uL (ref 0.7–4.0)
MCH: 32.1 pg (ref 26.0–34.0)
MCHC: 34 g/dL (ref 30.0–36.0)
MCV: 94.3 fL (ref 80.0–100.0)
Monocytes Absolute: 0.1 10*3/uL (ref 0.1–1.0)
Monocytes Relative: 2 %
Neutro Abs: 1.7 10*3/uL (ref 1.7–7.7)
Neutrophils Relative %: 69 %
Platelet Count: 119 10*3/uL — ABNORMAL LOW (ref 150–400)
RBC: 2.99 MIL/uL — ABNORMAL LOW (ref 4.22–5.81)
RDW: 16.7 % — ABNORMAL HIGH (ref 11.5–15.5)
WBC Count: 2.6 10*3/uL — ABNORMAL LOW (ref 4.0–10.5)
nRBC: 0 % (ref 0.0–0.2)

## 2018-12-08 LAB — TSH: TSH: 1.967 u[IU]/mL (ref 0.320–4.118)

## 2018-12-08 MED ORDER — SODIUM CHLORIDE 0.9% FLUSH
10.0000 mL | Freq: Once | INTRAVENOUS | Status: AC
  Administered 2018-12-08: 10 mL
  Filled 2018-12-08: qty 10

## 2018-12-08 MED ORDER — HEPARIN SOD (PORK) LOCK FLUSH 100 UNIT/ML IV SOLN
500.0000 [IU] | Freq: Once | INTRAVENOUS | Status: AC
  Administered 2018-12-08: 500 [IU]
  Filled 2018-12-08: qty 5

## 2018-12-15 ENCOUNTER — Inpatient Hospital Stay: Payer: Medicare Other

## 2018-12-15 ENCOUNTER — Other Ambulatory Visit: Payer: Self-pay | Admitting: Internal Medicine

## 2018-12-15 ENCOUNTER — Telehealth: Payer: Self-pay | Admitting: Medical Oncology

## 2018-12-15 ENCOUNTER — Other Ambulatory Visit: Payer: Self-pay

## 2018-12-15 DIAGNOSIS — Z95828 Presence of other vascular implants and grafts: Secondary | ICD-10-CM

## 2018-12-15 DIAGNOSIS — C3491 Malignant neoplasm of unspecified part of right bronchus or lung: Secondary | ICD-10-CM

## 2018-12-15 DIAGNOSIS — Z5112 Encounter for antineoplastic immunotherapy: Secondary | ICD-10-CM | POA: Diagnosis not present

## 2018-12-15 LAB — CMP (CANCER CENTER ONLY)
ALT: 38 U/L (ref 0–44)
AST: 27 U/L (ref 15–41)
Albumin: 3.4 g/dL — ABNORMAL LOW (ref 3.5–5.0)
Alkaline Phosphatase: 123 U/L (ref 38–126)
Anion gap: 12 (ref 5–15)
BUN: 20 mg/dL (ref 8–23)
CHLORIDE: 99 mmol/L (ref 98–111)
CO2: 24 mmol/L (ref 22–32)
Calcium: 9.2 mg/dL (ref 8.9–10.3)
Creatinine: 0.94 mg/dL (ref 0.61–1.24)
GFR, Est AFR Am: >60 mL/min (ref >60)
GFR, Estimated: >60 mL/min (ref >60)
Glucose, Bld: 117 mg/dL — ABNORMAL HIGH (ref 70–99)
POTASSIUM: 4.2 mmol/L (ref 3.5–5.1)
Sodium: 135 mmol/L (ref 135–145)
Total Bilirubin: 0.5 mg/dL (ref 0.3–1.2)
Total Protein: 6.8 g/dL (ref 6.5–8.1)

## 2018-12-15 LAB — CBC WITH DIFFERENTIAL (CANCER CENTER ONLY)
Abs Immature Granulocytes: 0 10*3/uL (ref 0.00–0.07)
BASOS ABS: 0 10*3/uL (ref 0.0–0.1)
Basophils Relative: 0 %
Eosinophils Absolute: 0 10*3/uL (ref 0.0–0.5)
Eosinophils Relative: 2 %
HCT: 24.1 % — ABNORMAL LOW (ref 39.0–52.0)
Hemoglobin: 7.9 g/dL — ABNORMAL LOW (ref 13.0–17.0)
Immature Granulocytes: 0 %
Lymphocytes Relative: 56 %
Lymphs Abs: 0.8 10*3/uL (ref 0.7–4.0)
MCH: 31.1 pg (ref 26.0–34.0)
MCHC: 32.8 g/dL (ref 30.0–36.0)
MCV: 94.9 fL (ref 80.0–100.0)
Monocytes Absolute: 0.2 10*3/uL (ref 0.1–1.0)
Monocytes Relative: 15 %
Neutro Abs: 0.4 10*3/uL — CL (ref 1.7–7.7)
Neutrophils Relative %: 27 %
Platelet Count: 27 10*3/uL — ABNORMAL LOW (ref 150–400)
RBC: 2.54 MIL/uL — ABNORMAL LOW (ref 4.22–5.81)
RDW: 16.1 % — ABNORMAL HIGH (ref 11.5–15.5)
WBC Count: 1.4 10*3/uL — ABNORMAL LOW (ref 4.0–10.5)
nRBC: 0 % (ref 0.0–0.2)

## 2018-12-15 MED ORDER — SODIUM CHLORIDE 0.9% FLUSH
10.0000 mL | Freq: Once | INTRAVENOUS | Status: AC
  Administered 2018-12-15: 10 mL
  Filled 2018-12-15: qty 10

## 2018-12-15 MED ORDER — HEPARIN SOD (PORK) LOCK FLUSH 100 UNIT/ML IV SOLN
500.0000 [IU] | Freq: Once | INTRAVENOUS | Status: AC
  Administered 2018-12-15: 500 [IU]
  Filled 2018-12-15: qty 5

## 2018-12-15 NOTE — Telephone Encounter (Signed)
Cassie notified of abnormal labs .

## 2018-12-21 ENCOUNTER — Other Ambulatory Visit: Payer: Self-pay | Admitting: Radiation Therapy

## 2018-12-21 DIAGNOSIS — C7949 Secondary malignant neoplasm of other parts of nervous system: Principal | ICD-10-CM

## 2018-12-21 DIAGNOSIS — C7931 Secondary malignant neoplasm of brain: Secondary | ICD-10-CM

## 2018-12-22 ENCOUNTER — Inpatient Hospital Stay (HOSPITAL_BASED_OUTPATIENT_CLINIC_OR_DEPARTMENT_OTHER): Payer: Medicare Other | Admitting: Internal Medicine

## 2018-12-22 ENCOUNTER — Inpatient Hospital Stay: Payer: Medicare Other

## 2018-12-22 ENCOUNTER — Encounter: Payer: Self-pay | Admitting: Internal Medicine

## 2018-12-22 ENCOUNTER — Other Ambulatory Visit: Payer: Self-pay

## 2018-12-22 VITALS — BP 131/48 | HR 63 | Temp 97.8°F | Resp 18 | Wt 165.0 lb

## 2018-12-22 DIAGNOSIS — Z5111 Encounter for antineoplastic chemotherapy: Secondary | ICD-10-CM

## 2018-12-22 DIAGNOSIS — Z5112 Encounter for antineoplastic immunotherapy: Secondary | ICD-10-CM

## 2018-12-22 DIAGNOSIS — C3491 Malignant neoplasm of unspecified part of right bronchus or lung: Secondary | ICD-10-CM

## 2018-12-22 DIAGNOSIS — C3431 Malignant neoplasm of lower lobe, right bronchus or lung: Secondary | ICD-10-CM | POA: Diagnosis not present

## 2018-12-22 DIAGNOSIS — C7931 Secondary malignant neoplasm of brain: Secondary | ICD-10-CM | POA: Diagnosis not present

## 2018-12-22 DIAGNOSIS — C7951 Secondary malignant neoplasm of bone: Secondary | ICD-10-CM

## 2018-12-22 DIAGNOSIS — C771 Secondary and unspecified malignant neoplasm of intrathoracic lymph nodes: Secondary | ICD-10-CM | POA: Diagnosis not present

## 2018-12-22 DIAGNOSIS — Z95828 Presence of other vascular implants and grafts: Secondary | ICD-10-CM

## 2018-12-22 DIAGNOSIS — D696 Thrombocytopenia, unspecified: Secondary | ICD-10-CM

## 2018-12-22 LAB — CMP (CANCER CENTER ONLY)
ALBUMIN: 3 g/dL — AB (ref 3.5–5.0)
ALT: 20 U/L (ref 0–44)
AST: 21 U/L (ref 15–41)
Alkaline Phosphatase: 103 U/L (ref 38–126)
Anion gap: 10 (ref 5–15)
BUN: 17 mg/dL (ref 8–23)
CO2: 25 mmol/L (ref 22–32)
Calcium: 9.1 mg/dL (ref 8.9–10.3)
Chloride: 102 mmol/L (ref 98–111)
Creatinine: 0.92 mg/dL (ref 0.61–1.24)
GFR, Est AFR Am: >60 mL/min (ref >60)
GFR, Estimated: >60 mL/min (ref >60)
Glucose, Bld: 123 mg/dL — ABNORMAL HIGH (ref 70–99)
Potassium: 4.1 mmol/L (ref 3.5–5.1)
Sodium: 137 mmol/L (ref 135–145)
Total Bilirubin: 0.4 mg/dL (ref 0.3–1.2)
Total Protein: 6.9 g/dL (ref 6.5–8.1)

## 2018-12-22 LAB — CBC WITH DIFFERENTIAL (CANCER CENTER ONLY)
Abs Immature Granulocytes: 0.1 10*3/uL — ABNORMAL HIGH (ref 0.00–0.07)
Basophils Absolute: 0 10*3/uL (ref 0.0–0.1)
Basophils Relative: 0 %
Eosinophils Absolute: 0 10*3/uL (ref 0.0–0.5)
Eosinophils Relative: 0 %
HCT: 24.4 % — ABNORMAL LOW (ref 39.0–52.0)
HEMOGLOBIN: 7.9 g/dL — AB (ref 13.0–17.0)
Immature Granulocytes: 2 %
LYMPHS ABS: 1 10*3/uL (ref 0.7–4.0)
LYMPHS PCT: 20 %
MCH: 32.1 pg (ref 26.0–34.0)
MCHC: 32.4 g/dL (ref 30.0–36.0)
MCV: 99.2 fL (ref 80.0–100.0)
Monocytes Absolute: 1 10*3/uL (ref 0.1–1.0)
Monocytes Relative: 21 %
Neutro Abs: 2.8 10*3/uL (ref 1.7–7.7)
Neutrophils Relative %: 57 %
Platelet Count: 164 10*3/uL (ref 150–400)
RBC: 2.46 MIL/uL — ABNORMAL LOW (ref 4.22–5.81)
RDW: 19.9 % — ABNORMAL HIGH (ref 11.5–15.5)
WBC Count: 4.9 10*3/uL (ref 4.0–10.5)
nRBC: 0 % (ref 0.0–0.2)

## 2018-12-22 MED ORDER — SODIUM CHLORIDE 0.9 % IV SOLN
Freq: Once | INTRAVENOUS | Status: AC
  Administered 2018-12-22: 13:00:00 via INTRAVENOUS
  Filled 2018-12-22: qty 250

## 2018-12-22 MED ORDER — PROCHLORPERAZINE MALEATE 10 MG PO TABS
ORAL_TABLET | ORAL | Status: AC
  Filled 2018-12-22: qty 1

## 2018-12-22 MED ORDER — SODIUM CHLORIDE 0.9 % IV SOLN
200.0000 mg | Freq: Once | INTRAVENOUS | Status: AC
  Administered 2018-12-22: 200 mg via INTRAVENOUS
  Filled 2018-12-22: qty 8

## 2018-12-22 MED ORDER — PROCHLORPERAZINE MALEATE 10 MG PO TABS
10.0000 mg | ORAL_TABLET | Freq: Once | ORAL | Status: AC
  Administered 2018-12-22: 10 mg via ORAL

## 2018-12-22 MED ORDER — HEPARIN SOD (PORK) LOCK FLUSH 100 UNIT/ML IV SOLN
500.0000 [IU] | Freq: Once | INTRAVENOUS | Status: AC | PRN
  Administered 2018-12-22: 500 [IU]
  Filled 2018-12-22: qty 5

## 2018-12-22 MED ORDER — SODIUM CHLORIDE 0.9% FLUSH
10.0000 mL | Freq: Once | INTRAVENOUS | Status: AC
  Administered 2018-12-22: 10 mL
  Filled 2018-12-22: qty 10

## 2018-12-22 MED ORDER — SODIUM CHLORIDE 0.9% FLUSH
10.0000 mL | INTRAVENOUS | Status: DC | PRN
  Administered 2018-12-22: 10 mL
  Filled 2018-12-22: qty 10

## 2018-12-22 MED ORDER — SODIUM CHLORIDE 0.9 % IV SOLN
405.0000 mg/m2 | Freq: Once | INTRAVENOUS | Status: AC
  Administered 2018-12-22: 800 mg via INTRAVENOUS
  Filled 2018-12-22: qty 20

## 2018-12-22 NOTE — Patient Instructions (Signed)
Coronavirus (COVID-19) Are you at risk?  Are you at risk for the Coronavirus (COVID-19)?  To be considered HIGH RISK for Coronavirus (COVID-19), you have to meet the following criteria:  . Traveled to [place], [place], [place], [place] or [place]; or in the [place] to [place], [place], [place], or [place]; and have fever, cough, and shortness of breath within the last 2 weeks of travel OR . Been in close contact with a person diagnosed with COVID-19 within the last 2 weeks and have fever, cough, and shortness of breath . IF YOU DO NOT MEET THESE CRITERIA, YOU ARE CONSIDERED LOW RISK FOR COVID-19.  What to do if you are HIGH RISK for COVID-19?  . If you are having a medical emergency, call 911. . Seek medical care right away. Before you go to a doctor's office, urgent care or emergency department, call ahead and tell them about your recent travel, contact with someone diagnosed with COVID-19, and your symptoms. You should receive instructions from your physician's office regarding next steps of care.  . When you arrive at healthcare provider, tell the healthcare staff immediately you have returned from visiting [place], [place], [place], [place] or [place]; or traveled in the [place] to [place], [place], [place], or [place]; in the last two weeks or you have been in close contact with a person diagnosed with COVID-19 in the last 2 weeks.   . Tell the health care staff about your symptoms: fever, cough and shortness of breath. . After you have been seen by a medical provider, you will be either: o Tested for (COVID-19) and discharged home on quarantine except to seek medical care if symptoms worsen, and asked to  - Stay home and avoid contact with others until you get your results (4-5 days)  - Avoid travel on public transportation if possible (such as bus, train, or airplane) or o Sent to the Emergency Department by EMS for evaluation, COVID-19 testing, and possible  admission depending on your condition and test results.  What to do if you are LOW RISK for COVID-19?  Reduce your risk of any infection by using the same precautions used for avoiding the common cold or flu:  . Wash your hands often with soap and warm water for at least 20 seconds.  If soap and water are not readily available, use an alcohol-based hand sanitizer with at least 60% alcohol.  . If coughing or sneezing, cover your mouth and nose by coughing or sneezing into the elbow areas of your shirt or coat, into a tissue or into your sleeve (not your hands). . Avoid shaking hands with others and consider head nods or verbal greetings only. . Avoid touching your eyes, nose, or mouth with unwashed hands.  . Avoid close contact with people who are sick. . Avoid places or events with large numbers of people in one location, like concerts or sporting events. . Carefully consider travel plans you have or are making. . If you are planning any travel outside or inside the [place], visit the CDC's Travelers' Health webpage for the latest health notices. . If you have some symptoms but not all symptoms, continue to monitor at home and seek medical attention if your symptoms worsen. . If you are having a medical emergency, call 911.   ADDITIONAL HEALTHCARE OPTIONS FOR PATIENTS  [facility] / e-Visit: [email or web address]         [facility]: [phone]  [place]   Urgent Care: [facility]: [place] Discharge Instructions for Patients Receiving Chemotherapy  Today you received the following chemotherapy agents: Pembrolizumab (Keytruda) and Pemetrexed (Alimta)  To help prevent nausea and vomiting after your treatment, we encourage you to take your nausea medication as directed.    If you develop nausea and vomiting that is not controlled by your nausea  medication, call the clinic.   BELOW ARE SYMPTOMS THAT SHOULD BE REPORTED IMMEDIATELY:  *FEVER GREATER THAN 100.5 F  *CHILLS WITH OR WITHOUT FEVER  NAUSEA AND VOMITING THAT IS NOT CONTROLLED WITH YOUR NAUSEA MEDICATION  *UNUSUAL SHORTNESS OF BREATH  *UNUSUAL BRUISING OR BLEEDING  TENDERNESS IN MOUTH AND THROAT WITH OR WITHOUT PRESENCE OF ULCERS  *URINARY PROBLEMS  *BOWEL PROBLEMS  UNUSUAL RASH Items with * indicate a potential emergency and should be followed up as soon as possible.  Feel free to call the clinic should you have any questions or concerns. The clinic phone number is [phone].  Please show the [facility] at check-in to the Emergency Department and triage nurse.

## 2018-12-22 NOTE — Progress Notes (Signed)
12/22/18  Received ok from [place] ok to treat with today Hgb level.  V.O. Dr [name]/Katelin RN/Mckay Brandt Ronnald Ramp, PharmD

## 2018-12-22 NOTE — Progress Notes (Signed)
OK to treat with HGB of 7.9 per Dr. Julien Nordmann

## 2018-12-22 NOTE — Progress Notes (Signed)
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  Molecular Studies by [facility]: Showed no actionable mutations.  PRIOR THERAPY:1) palliative radiation to the right neck and shoulder.  He is scheduled to have [place] to the brain lesions on 09/01/2018.  CURRENT THERAPY: 1) palliative systemic chemotherapy with carboplatin for an AUC of 5, Alimta 400 mg/m, and Keytruda 200 mg IV every 3 weeks.  First dose September 30, 2018.  Status post 4 cycles.  Starting from cycle #5 the patient will be treated with maintenance Alimta and Keytruda.  INTERVAL HISTORY: Tony [name] 78 y.o. male returns to the clinic today for follow-up visit.  The patient is feeling fine today with no concerning complaints except for fatigue.  He is recovering from a gout episode and was treated with colchicine.  He denied having any current chest pain, shortness of breath, cough or hemoptysis.  He denied having any fever or chills.  He has no nausea, vomiting, diarrhea or constipation.  He is here today for evaluation before starting the first dose of maintenance treatment with Alimta and Keytruda.  MEDICAL HISTORY: Past Medical History:  Diagnosis Date   Anemia    Brain metastases (HCC)    CAD (coronary artery disease)    Constipation    Gout    Heart disease    History of blood transfusion    History of radiation therapy 09/01/2018   brain, cerebella (5 sites)/ 20 Gy in 1 fraction.   HLD (hyperlipidemia) 10/13/2018   HTN (hypertension) 10/13/2018   Hyperlipidemia    Hypertension    Lung cancer ([place])    Mass of  lower lobe of right lung    Myocardial infarction [facility])    MI in 2000   Peripheral vascular disease ([place])    Abdominal Aortic Aneurysm   Prostate cancer ([place]) Nov. 2012   Ulcer     ALLERGIES:  has No Known Allergies.  MEDICATIONS:  Current Outpatient Medications  Medication Sig Dispense Refill   acetaminophen (TYLENOL) 650 MG CR tablet Take 650 mg by mouth daily as needed for pain.      aspirin 81 MG tablet Take 81 mg by mouth every evening.      colchicine 0.6 MG tablet Take 0.6 mg by mouth daily.     folic acid (FOLVITE) 811 MCG tablet Take 800 mcg by mouth daily.      metoprolol tartrate (LOPRESSOR) 50 MG tablet Take 50 mg by mouth 2 (two) times daily.     Multiple Vitamins-Minerals (MULTIVITAMIN WITH MINERALS) tablet Take 1 tablet by mouth daily.     mupirocin ointment (BACTROBAN) 2 % Place 1 application into the nose 3 (three) times daily. Place over area 3 times daily (Patient not taking: Reported on 12/01/2018) 30 g 0   niacin 500 MG tablet Take 500 mg by mouth 2 (two) times daily.     Omega-3 Fatty Acids (FISH OIL) 1200 MG CAPS Take 1,200 mg by mouth daily.      omeprazole (PRILOSEC OTC) 20 MG tablet Take 10 mg by mouth 2 (two) times a week.     polyethylene glycol (MIRALAX / GLYCOLAX) packet  Take 17 g by mouth 4 (four) times a week.      pravastatin (PRAVACHOL) 10 MG tablet Take 10 mg by mouth every evening.      Psyllium (METAMUCIL FIBER PO) Take 1 Dose by mouth 3 (three) times a week.      ramipril (ALTACE) 10 MG capsule Take 10 mg by mouth every morning.      sulfamethoxazole-trimethoprim (BACTRIM DS,SEPTRA DS) 800-160 MG tablet Take 1 tablet by mouth 2 (two) times daily. 14 tablet 0   triamterene-hydrochlorothiazide (MAXZIDE-25) 37.5-25 MG tablet Take 0.5 tablets by mouth every morning.      No current facility-administered medications for this visit.     SURGICAL HISTORY:  Past Surgical History:  Procedure Laterality Date   ANAL FISSURECTOMY       COLONOSCOPY     CORONARY ARTERY BYPASS GRAFT     2000   EYE SURGERY Left Nov. 2013   Cataract   EYE SURGERY Right Feb. 2014   Cataract   HERNIA REPAIR     PORTACATH PLACEMENT Left 09/24/2018   Procedure: INSERTION PORT-A-CATH;  Surgeon: Melrose Nakayama, MD;  Location: [facility] OR;  Service: Thoracic;  Laterality: Left;   PR VEIN BYPASS GRAFT,AORTO-FEM-POP  2000   PROSTATE SURGERY  Nov. 2012   ROBOT ASSISTED LAPAROSCOPIC RADICAL PROSTATECTOMY  08/08/2011   Procedure: ROBOTIC ASSISTED LAPAROSCOPIC RADICAL PROSTATECTOMY LEVEL 2;  Surgeon: Dutch Gray, MD;  Location: [facility] ORS;  Service: Urology;  Laterality: Bilateral;  with Bilateral Pelvic Lymphadenectomy    SHOULDER SURGERY  1980'S   VIDEO BRONCHOSCOPY WITH ENDOBRONCHIAL ULTRASOUND N/A 08/06/2018   Procedure: VIDEO BRONCHOSCOPY WITH ENDOBRONCHIAL ULTRASOUND;  Surgeon: Melrose Nakayama, MD;  Location: [place];  Service: Thoracic;  Laterality: N/A;   WISDOM TOOTH EXTRACTION      REVIEW OF SYSTEMS:  A comprehensive review of systems was negative except for: Constitutional: positive for fatigue Musculoskeletal: positive for muscle weakness   PHYSICAL EXAMINATION: General appearance: alert, cooperative, fatigued and no distress Head: Normocephalic, without obvious abnormality, atraumatic Neck: no adenopathy, no JVD, supple, symmetrical, trachea midline and thyroid not enlarged, symmetric, no tenderness/mass/nodules Lymph nodes: Cervical, supraclavicular, and axillary nodes normal. Resp: clear to auscultation bilaterally Back: symmetric, no curvature. ROM normal. No CVA tenderness. Cardio: regular rate and rhythm, S1, S2 normal, no murmur, click, rub or gallop GI: soft, non-tender; bowel sounds normal; no masses,  no organomegaly Extremities: extremities normal, atraumatic, no cyanosis or edema  ECOG PERFORMANCE STATUS: 1 - Symptomatic but completely ambulatory  Blood pressure (!) 131/48, pulse 63, temperature 97.8 F (36.6  C), temperature source Oral, resp. rate 18, weight 165 lb (74.8 kg), SpO2 100 %.  LABORATORY DATA: Lab Results  Component Value Date   WBC 4.9 12/22/2018   HGB 7.9 (L) 12/22/2018   HCT 24.4 (L) 12/22/2018   MCV 99.2 12/22/2018   PLT 164 12/22/2018      Chemistry      Component Value Date/Time   NA 135 12/15/2018 1124   NA 139 08/10/2012 1328   K 4.2 12/15/2018 1124   K 4.1 08/10/2012 1328   CL 99 12/15/2018 1124   CL 105 08/10/2012 1328   CO2 24 12/15/2018 1124   CO2 29 08/10/2012 1328   BUN 20 12/15/2018 1124   BUN 18.0 08/10/2012 1328   CREATININE 0.94 12/15/2018 1124   CREATININE 1.1 08/10/2012 1328      Component Value Date/Time   CALCIUM 9.2 12/15/2018 1124   CALCIUM 9.0 08/10/2012 1328  ALKPHOS 123 12/15/2018 1124   ALKPHOS 144 08/10/2012 1328   AST 27 12/15/2018 1124   AST 42 (H) 08/10/2012 1328   ALT 38 12/15/2018 1124   ALT 55 08/10/2012 1328   BILITOT 0.5 12/15/2018 1124   BILITOT 0.91 08/10/2012 1328       RADIOGRAPHIC STUDIES: Ct Chest W Contrast  Result Date: 11/30/2018 CLINICAL DATA:  Lung cancer metastatic to brain and bone. Ongoing chemotherapy. Chemotherapy completed in January. History of prostate cancer and intermittent cough. Weight loss and constipation. EXAM: CT CHEST, ABDOMEN, AND PELVIS WITH CONTRAST TECHNIQUE: Multidetector CT imaging of the chest, abdomen and pelvis was performed following the standard protocol during bolus administration of intravenous contrast. CONTRAST:  179mL OMNIPAQUE IOHEXOL 300 MG/ML  SOLN COMPARISON:  PET 07/27/2018, CT chest 07/20/2018 and CT abdomen pelvis 06/12/2011. FINDINGS: CT CHEST FINDINGS Cardiovascular: Left IJ Port-A-Cath terminates in the SVC. Atherosclerotic calcification of the aorta. Heart size normal. No pericardial effusion. Pulmonary arteries are enlarged. Mediastinum/Nodes: No pathologically enlarged mediastinal lymph nodes. Previously measured subcarinal lymph node is no longer seen as a separate  structure from a nodule in the right lower lobe. No hilar or axillary adenopathy. Esophagus is unremarkable. Lungs/Pleura: Image quality is degraded by respiratory motion. 2 mm anterior segment right upper lobe nodule (series 6, image 68), as on 07/20/2018. Central right lower lobe mass is contiguous with a previously measured subcarinal lymph node and collectively, measures 2.9 x 3.5 cm (series 6, image 84), previously 3.2 x 3.6 cm. No pleural fluid. Airway is unremarkable. Musculoskeletal: Degenerative changes in the spine. Lytic lesion in associated fracture involving the glenoid of the right scapula. CT ABDOMEN PELVIS FINDINGS Hepatobiliary: Subcentimeter low-attenuation lesions in the right hepatic lobe are too small to characterize but stable. Liver and gallbladder are otherwise unremarkable. No biliary ductal dilatation. Pancreas: Negative. Spleen: Negative. Adrenals/Urinary Tract: Adrenal glands are unremarkable. Low-attenuation lesions off both kidneys measure up to 3.2 cm on the left and are indicative of cysts. Kidneys are otherwise unremarkable. Ureters are decompressed. Bladder is low in volume. Stomach/Bowel: Stomach and small bowel are unremarkable. An appendicolith is incidentally noted. Appendix and colon are otherwise unremarkable. Vascular/Lymphatic: Atherosclerotic calcification of the aorta. Infrarenal aortic aneurysm measures 3.3 cm. 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 No pathologically enlarged lymph nodes. Reproductive: Prostatectomy. Other: No free fluid.  Mesenteries and peritoneum are unremarkable. Musculoskeletal: Increasingly sclerotic lesions are seen in L1 and the left iliac wing. Associated compression deformity of the inferior endplate of L1. No new lesions. IMPRESSION: 1. Interval response to therapy as evidenced by decrease in size of the primary right lower lobe mass. Probable residual subcarinal adenopathy but difficult to measure as a discrete  lesion. 2. Adrenal glands appear morphologically normal. 3. Increasingly sclerotic L1 and left iliac wing metastases, possibly due to healing. 4. Infrarenal Aortic aneurysm NOS (ICD10-I71.9). Recommend followup by ultrasound in 3 years. This recommendation follows ACR consensus guidelines: White Paper of the ACR Incidental Findings Committee II on Vascular Findings. J Am Coll Radiol 2013; 10:789-794. 5. 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 6.  Aortic atherosclerosis (ICD10-170.0). 7. Enlarged pulmonary arteries, indicative of pulmonary arterial hypertension. Electronically Signed   By: Lorin Picket M.D.   On: 11/30/2018 14:19   Mr Jeri Cos GE Contrast  Result Date: 12/03/2018 CLINICAL DATA:  Follow-up of treated metastatic lung cancer. PRIOR THERAPY: 1) palliative radiation to the right neck and shoulder. He is scheduled to have [place] to the brain lesions  on 09/01/2018. CURRENT THERAPY: 1) palliative systemic chemotherapy with carboplatin for an AUC of 5, Alimta 500 mg/m, and Keytruda 200 mg IV every 3 weeks. First dose September 30, 2018. Status post 3 cycles. EXAM: MRI HEAD WITHOUT AND WITH CONTRAST TECHNIQUE: Multiplanar, multiecho pulse sequences of the brain and surrounding structures were obtained without and with intravenous contrast. CONTRAST:  7.5 mL Gadavist COMPARISON:  Brain MRI 08/22/2018, 08/01/2018 FINDINGS: BRAIN: The 3 previously seen contrast-enhancing lesions within the cerebellum have substantially resolved. There are no new parenchymal lesions. 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 No acute or chronic hemorrhage. No age advanced or lobar predominant atrophy pattern. VASCULAR: Major intracranial arterial and venous sinus flow voids are normal. SKULL AND UPPER CERVICAL SPINE: Enhancement within the left C1 lesion is decreased from the prior study. No new osseous lesions. SINUSES/ORBITS: No fluid levels  or advanced mucosal thickening. No mastoid or middle ear effusion. The orbits are normal. IMPRESSION: 1. Resolution of contrast-enhancing lesions within the cerebellum. No new parenchymal lesions. 2. 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 Electronically Signed   By: Ulyses Jarred M.D.   On: 12/03/2018 15:05   Ct Abdomen Pelvis W Contrast  Result Date: 11/30/2018 CLINICAL DATA:  Lung cancer metastatic to brain and bone. Ongoing chemotherapy. Chemotherapy completed in January. History of prostate cancer and intermittent cough. Weight loss and constipation. EXAM: CT CHEST, ABDOMEN, AND PELVIS WITH CONTRAST TECHNIQUE: Multidetector CT imaging of the chest, abdomen and pelvis was performed following the standard protocol during bolus administration of intravenous contrast. CONTRAST:  139mL OMNIPAQUE IOHEXOL 300 MG/ML  SOLN COMPARISON:  PET 07/27/2018, CT chest 07/20/2018 and CT abdomen pelvis 06/12/2011. FINDINGS: CT CHEST FINDINGS Cardiovascular: Left IJ Port-A-Cath terminates in the SVC. Atherosclerotic calcification of the aorta. Heart size normal. No pericardial effusion. Pulmonary arteries are enlarged. Mediastinum/Nodes: No pathologically enlarged mediastinal lymph nodes. Previously measured subcarinal lymph node is no longer seen as a separate structure from a nodule in the right lower lobe. No hilar or axillary adenopathy. Esophagus is unremarkable. Lungs/Pleura: Image quality is degraded by respiratory motion. 2 mm anterior segment right upper lobe nodule (series 6, image 68), as on 07/20/2018. Central right lower lobe mass is contiguous with a previously measured subcarinal lymph node and collectively, measures 2.9 x 3.5 cm (series 6, image 84), previously 3.2 x 3.6 cm. No pleural fluid. Airway is unremarkable. Musculoskeletal: Degenerative changes in the spine. Lytic lesion in associated fracture involving the glenoid of the right scapula. CT ABDOMEN PELVIS FINDINGS Hepatobiliary:  Subcentimeter low-attenuation lesions in the right hepatic lobe are too small to characterize but stable. Liver and gallbladder are otherwise unremarkable. No biliary ductal dilatation. Pancreas: Negative. Spleen: Negative. Adrenals/Urinary Tract: Adrenal glands are unremarkable. Low-attenuation lesions off both kidneys measure up to 3.2 cm on the left and are indicative of cysts. Kidneys are otherwise unremarkable. Ureters are decompressed. Bladder is low in volume. Stomach/Bowel: Stomach and small bowel are unremarkable. An appendicolith is incidentally noted. Appendix and colon are otherwise unremarkable. Vascular/Lymphatic: Atherosclerotic calcification of the aorta. Infrarenal aortic aneurysm measures 3.3 cm. 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 No pathologically enlarged lymph nodes. Reproductive: Prostatectomy. Other: No free fluid.  Mesenteries and peritoneum are unremarkable. Musculoskeletal: Increasingly sclerotic lesions are seen in L1 and the left iliac wing. Associated compression deformity of the inferior endplate of L1. No new lesions. IMPRESSION: 1. Interval response to therapy as evidenced by decrease in size of the  primary right lower lobe mass. Probable residual subcarinal adenopathy but difficult to measure as a discrete lesion. 2. Adrenal glands appear morphologically normal. 3. Increasingly sclerotic L1 and left iliac wing metastases, possibly due to healing. 4. Infrarenal Aortic aneurysm NOS (ICD10-I71.9). Recommend followup by ultrasound in 3 years. This recommendation follows ACR consensus guidelines: White Paper of the ACR Incidental Findings Committee II on Vascular Findings. J Am Coll Radiol 2013; 10:789-794. 5. 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 6.  Aortic atherosclerosis (ICD10-170.0). 7. Enlarged pulmonary arteries, indicative of pulmonary arterial hypertension. Electronically Signed   By: Lorin Picket M.D.   On: 11/30/2018  14:19    ASSESSMENT AND PLAN: This is a very pleasant 78 years old white male with a stage IV non-small cell lung cancer, adenocarcinoma presented with large right lower lobe lung mass in addition to mediastinal lymphadenopathy, metastatic disease to the adrenal glands as well as several metastatic bone lesions including the right shoulder, lumbar spine and pelvic bones.  The patient has no actionable mutations. He underwent palliative radiotherapy to the right shoulder and painful metastatic bone lesions. He is feeling much better after the radiotherapy. He is currently undergoing systemic chemotherapy tomorrow with carboplatin for AUC of 5, Alimta 500 mg/M2 and Keytruda 200 mg IV every 3 weeks.  Status post 2 cycles. The patient has been tolerating this treatment well except for fatigue. I recommended for him to proceed with cycle #3 today as scheduled. I will see him back for follow-up visit in 3 weeks for evaluation after repeating CT scan of the chest, abdomen and pelvis for restaging of his disease. He was advised to call immediately if he has any concerning symptoms in the interval. The patient voices understanding of current disease status and treatment options and is in agreement with the current care plan. All questions were answered. The patient knows to call the clinic with any problems, questions or concerns. We can certainly see the patient much sooner if necessary.  I spent 10 minutes counseling the patient face to face. The total time spent in the appointment was 15 minutes.  Disclaimer: This note was dictated with voice recognition software. Similar sounding words can inadvertently be transcribed and may not be corrected upon review.

## 2018-12-23 ENCOUNTER — Telehealth: Payer: Self-pay | Admitting: Medical Oncology

## 2018-12-23 NOTE — Telephone Encounter (Signed)
"   My hgb is 7.9 . Can I take aspirin?" Per Tony [name] I told the pt he can take his daily low dose aspirin.

## 2018-12-24 ENCOUNTER — Other Ambulatory Visit: Payer: Self-pay | Admitting: Medical Oncology

## 2018-12-24 ENCOUNTER — Telehealth: Payer: Self-pay | Admitting: Internal Medicine

## 2018-12-24 DIAGNOSIS — T451X5A Adverse effect of antineoplastic and immunosuppressive drugs, initial encounter: Secondary | ICD-10-CM

## 2018-12-24 DIAGNOSIS — D509 Iron deficiency anemia, unspecified: Secondary | ICD-10-CM

## 2018-12-24 DIAGNOSIS — D6481 Anemia due to antineoplastic chemotherapy: Secondary | ICD-10-CM

## 2018-12-24 NOTE — Telephone Encounter (Signed)
Concerned about low hgb. And wants it rechecked next week.

## 2018-12-24 NOTE — Telephone Encounter (Signed)
Patient contacted by scheduler re appointments per 3/31 los. Per los patient to return every 3 weeks for lab/fu/infusion and weekly labs are to be cancelled.   Both patient and his wife extremely concerned about weekly labs being cancelled. Per patient/wife his count are very low and they would like to now why weekly labs are being cancelled.   Message to MM/desk to contact patient re concerns about weekly lab cancellation.

## 2018-12-24 NOTE — Telephone Encounter (Signed)
ok 

## 2018-12-25 ENCOUNTER — Other Ambulatory Visit: Payer: Self-pay | Admitting: Medical Oncology

## 2018-12-28 ENCOUNTER — Telehealth: Payer: Self-pay | Admitting: Internal Medicine

## 2018-12-28 NOTE — Telephone Encounter (Signed)
Spoke with patient re 4/7 appointment.

## 2018-12-29 ENCOUNTER — Other Ambulatory Visit: Payer: Medicare Other

## 2018-12-29 ENCOUNTER — Other Ambulatory Visit: Payer: Self-pay | Admitting: Medical

## 2018-12-29 ENCOUNTER — Inpatient Hospital Stay: Payer: Medicare Other

## 2018-12-29 ENCOUNTER — Telehealth: Payer: Self-pay | Admitting: *Deleted

## 2018-12-29 ENCOUNTER — Inpatient Hospital Stay (HOSPITAL_BASED_OUTPATIENT_CLINIC_OR_DEPARTMENT_OTHER): Payer: Medicare Other | Admitting: Medical

## 2018-12-29 ENCOUNTER — Inpatient Hospital Stay: Payer: Medicare Other | Attending: Oncology

## 2018-12-29 ENCOUNTER — Other Ambulatory Visit: Payer: Self-pay

## 2018-12-29 VITALS — BP 140/65 | HR 80 | Temp 98.3°F | Resp 18 | Ht 69.0 in | Wt 164.2 lb

## 2018-12-29 DIAGNOSIS — D6481 Anemia due to antineoplastic chemotherapy: Secondary | ICD-10-CM | POA: Diagnosis not present

## 2018-12-29 DIAGNOSIS — I110 Hypertensive heart disease with heart failure: Secondary | ICD-10-CM | POA: Insufficient documentation

## 2018-12-29 DIAGNOSIS — I2510 Atherosclerotic heart disease of native coronary artery without angina pectoris: Secondary | ICD-10-CM

## 2018-12-29 DIAGNOSIS — C7931 Secondary malignant neoplasm of brain: Secondary | ICD-10-CM | POA: Diagnosis not present

## 2018-12-29 DIAGNOSIS — C7951 Secondary malignant neoplasm of bone: Secondary | ICD-10-CM | POA: Insufficient documentation

## 2018-12-29 DIAGNOSIS — T451X5A Adverse effect of antineoplastic and immunosuppressive drugs, initial encounter: Secondary | ICD-10-CM

## 2018-12-29 DIAGNOSIS — C3491 Malignant neoplasm of unspecified part of right bronchus or lung: Secondary | ICD-10-CM

## 2018-12-29 DIAGNOSIS — Z5112 Encounter for antineoplastic immunotherapy: Secondary | ICD-10-CM | POA: Insufficient documentation

## 2018-12-29 DIAGNOSIS — C3431 Malignant neoplasm of lower lobe, right bronchus or lung: Secondary | ICD-10-CM | POA: Insufficient documentation

## 2018-12-29 DIAGNOSIS — L03116 Cellulitis of left lower limb: Secondary | ICD-10-CM

## 2018-12-29 DIAGNOSIS — C7972 Secondary malignant neoplasm of left adrenal gland: Secondary | ICD-10-CM | POA: Diagnosis not present

## 2018-12-29 DIAGNOSIS — C7971 Secondary malignant neoplasm of right adrenal gland: Secondary | ICD-10-CM | POA: Insufficient documentation

## 2018-12-29 DIAGNOSIS — Z5111 Encounter for antineoplastic chemotherapy: Secondary | ICD-10-CM | POA: Insufficient documentation

## 2018-12-29 DIAGNOSIS — Z87891 Personal history of nicotine dependence: Secondary | ICD-10-CM | POA: Diagnosis not present

## 2018-12-29 DIAGNOSIS — M79673 Pain in unspecified foot: Secondary | ICD-10-CM

## 2018-12-29 DIAGNOSIS — C771 Secondary and unspecified malignant neoplasm of intrathoracic lymph nodes: Secondary | ICD-10-CM | POA: Diagnosis not present

## 2018-12-29 DIAGNOSIS — Z79899 Other long term (current) drug therapy: Secondary | ICD-10-CM | POA: Diagnosis not present

## 2018-12-29 LAB — CBC WITH DIFFERENTIAL (CANCER CENTER ONLY)
Abs Immature Granulocytes: 0.01 10*3/uL (ref 0.00–0.07)
Basophils Absolute: 0 10*3/uL (ref 0.0–0.1)
Basophils Relative: 1 %
Eosinophils Absolute: 0 10*3/uL (ref 0.0–0.5)
Eosinophils Relative: 0 %
HCT: 22.9 % — ABNORMAL LOW (ref 39.0–52.0)
Hemoglobin: 7.4 g/dL — ABNORMAL LOW (ref 13.0–17.0)
Immature Granulocytes: 1 %
Lymphocytes Relative: 38 %
Lymphs Abs: 0.7 10*3/uL (ref 0.7–4.0)
MCH: 31.6 pg (ref 26.0–34.0)
MCHC: 32.3 g/dL (ref 30.0–36.0)
MCV: 97.9 fL (ref 80.0–100.0)
Monocytes Absolute: 0.1 10*3/uL (ref 0.1–1.0)
Monocytes Relative: 3 %
Neutro Abs: 1.1 10*3/uL — ABNORMAL LOW (ref 1.7–7.7)
Neutrophils Relative %: 57 %
Platelet Count: 121 10*3/uL — ABNORMAL LOW (ref 150–400)
RBC: 2.34 MIL/uL — ABNORMAL LOW (ref 4.22–5.81)
RDW: 18.3 % — ABNORMAL HIGH (ref 11.5–15.5)
WBC Count: 1.9 10*3/uL — ABNORMAL LOW (ref 4.0–10.5)
nRBC: 0 % (ref 0.0–0.2)

## 2018-12-29 LAB — CMP (CANCER CENTER ONLY)
ALT: 22 U/L (ref 0–44)
AST: 28 U/L (ref 15–41)
Albumin: 3 g/dL — ABNORMAL LOW (ref 3.5–5.0)
Alkaline Phosphatase: 89 U/L (ref 38–126)
Anion gap: 9 (ref 5–15)
BUN: 22 mg/dL (ref 8–23)
CO2: 26 mmol/L (ref 22–32)
Calcium: 9 mg/dL (ref 8.9–10.3)
Chloride: 100 mmol/L (ref 98–111)
Creatinine: 0.87 mg/dL (ref 0.61–1.24)
GFR, Est AFR Am: >60 mL/min (ref >60)
GFR, Estimated: >60 mL/min (ref >60)
Glucose, Bld: 114 mg/dL — ABNORMAL HIGH (ref 70–99)
Potassium: 4.3 mmol/L (ref 3.5–5.1)
Sodium: 135 mmol/L (ref 135–145)
Total Bilirubin: 0.6 mg/dL (ref 0.3–1.2)
Total Protein: 6.8 g/dL (ref 6.5–8.1)

## 2018-12-29 LAB — SAMPLE TO BLOOD BANK

## 2018-12-29 LAB — TSH: TSH: 0.669 u[IU]/mL (ref 0.320–4.118)

## 2018-12-29 LAB — URIC ACID: Uric Acid, Serum: 5.7 mg/dL (ref 3.7–8.6)

## 2018-12-29 MED ORDER — DOXYCYCLINE HYCLATE 100 MG PO TABS: 100.0000 mg | ORAL_TABLET | Freq: Two times a day (BID) | ORAL | 0 refills | Status: DC

## 2018-12-29 NOTE — Telephone Encounter (Signed)
Received vm message from patient requesting a call back. TCT patient. He states he wants to be seen today after his lab work is done as he is having problems/pain in his left foot. He states he is not sure if it is gout ot related to his treatments. He can seen in [facility] after his labs. High priority scheduling message sent

## 2018-12-29 NOTE — Patient Instructions (Addendum)
Gout  Gout is painful swelling of your joints. Gout is a type of arthritis. It is caused by having too much uric acid in your body. Uric acid is a chemical that is made when your body breaks down substances called purines. If your body has too much uric acid, sharp crystals can form and build up in your joints. This causes pain and swelling. Gout attacks can happen quickly and be very painful (acute gout). Over time, the attacks can affect more joints and happen more often (chronic gout). What are the causes?  Too much uric acid in your blood. This can happen because: ? Your kidneys do not remove enough uric acid from your blood. ? Your body makes too much uric acid. ? You eat too many foods that are high in purines. These foods include organ meats, some seafood, and beer.  Trauma or stress. What increases the risk?  Having a family history of gout.  Being male and middle-aged.  Being male and having gone through menopause.  Being very overweight (obese).  Drinking alcohol, especially beer.  Not having enough water in the body (being dehydrated).  Losing weight too quickly.  Having an organ transplant.  Having lead poisoning.  Taking certain medicines.  Having kidney disease.  Having a skin condition called psoriasis. What are the signs or symptoms? An attack of acute gout usually happens in just one joint. The most common place is the big toe. Attacks often start at night. Other joints that may be affected include joints of the feet, ankle, knee, fingers, wrist, or elbow. Symptoms of an attack may include:  Very bad pain.  Warmth.  Swelling.  Stiffness.  Shiny, red, or purple skin.  Tenderness. The affected joint may be very painful to touch.  Chills and fever. Chronic gout may cause symptoms more often. More joints may be involved. You may also have white or yellow lumps (tophi) on your hands or feet or in other areas near your joints. How is this treated?   Treatment for this condition has two phases: treating an acute attack and preventing future attacks.  Acute gout treatment may include: ? NSAIDs. ? Steroids. These are taken by mouth or injected into a joint. ? Colchicine. This medicine relieves pain and swelling. It can be given by mouth or through an IV tube.  Preventive treatment may include: ? Taking small doses of NSAIDs or colchicine daily. ? Using a medicine that reduces uric acid levels in your blood. ? Making changes to your diet. You may need to see a food expert (dietitian) about what to eat and drink to prevent gout. Follow these instructions at home: During a gout attack   If told, put ice on the painful area: ? Put ice in a plastic bag. ? Place a towel between your skin and the bag. ? Leave the ice on for 20 minutes, 2-3 times a day.  Raise (elevate) the painful joint above the level of your heart as often as you can.  Rest the joint as much as possible. If the joint is in your leg, you may be given crutches.  Follow instructions from your doctor about what you cannot eat or drink. Avoiding future gout attacks  Eat a low-purine diet. Avoid foods and drinks such as: ? Liver. ? Kidney. ? Anchovies. ? Asparagus. ? Herring. ? Mushrooms. ? Mussels. ? Beer.  Stay at a healthy weight. If you want to lose weight, talk with your doctor. Do not lose weight  too fast.  Start or continue an exercise plan as told by your doctor. Eating and drinking  Drink enough fluids to keep your pee (urine) pale yellow.  If you drink alcohol: ? Limit how much you use to:  0-1 drink a day for women.  0-2 drinks a day for men. ? Be aware of how much alcohol is in your drink. In the [place] one drink equals one 12 oz bottle of beer (355 mL), one 5 oz glass of wine (148 mL), or one 1 oz glass of hard liquor (44 mL). General instructions  Take over-the-counter and prescription medicines only as told by your doctor.  Do not drive or  use heavy machinery while taking prescription pain medicine.  Return to your normal activities as told by your doctor. Ask your doctor what activities are safe for you.  Keep all follow-up visits as told by your doctor. This is important. Contact a doctor if:  You have another gout attack.  You still have symptoms of a gout attack after 10 days of treatment.  You have problems (side effects) because of your medicines.  You have chills or a fever.  You have burning pain when you pee (urinate).  You have pain in your lower back or belly. Get help right away if:  You have very bad pain.  Your pain cannot be controlled.  You cannot pee. Summary  Gout is painful swelling of the joints.  The most common site of pain is the big toe, but it can affect other joints.  Medicines and avoiding some foods can help to prevent and treat gout attacks. This information is not intended to replace advice given to you by your health care provider. Make sure you discuss any questions you have with your health care provider. Document Released: 06/18/2008 Document Revised: 04/01/2018 Document Reviewed: 04/01/2018 Elsevier Interactive Patient Education  2019 [employer].       Probiotic as directed on package

## 2018-12-31 NOTE — Progress Notes (Signed)
[facility] Progress Note   Tony [name] [id number] Apr 21, 1941 78 y.o.  Tony [name] is managed by Dr. Fanny Bien. Tony [name]  Actively treated with chemotherapy/immunotherapy/hormonal therapy: yes  Current therapy: Alimta and Keytruda  Last treated: 12/22/2018 (cycle 5, day 1)  Next scheduled appointment with provider: 01/12/2019  Assessment: Plan:    Adenocarcinoma, lung, right ([place])  Cellulitis of left lower extremity - Plan: doxycycline (VIBRA-TABS) 100 MG tablet   Metastatic adenocarcinoma of the lung: The patient continues to be managed by Dr. Julien [name] and is status post cycle 5, day 1 of Alimta and Keytruda which was dosed on 12/22/2018.  Tony [name] is scheduled to see Dr. Julien [name] in follow-up on 01/12/2019.  Cellulitis of the left foot: The patient was given a prescription for doxycycline 100 mg p.o. twice daily x7 days.  Tony [name] was instructed to return or call as needed.  Please see After Visit Summary for patient specific instructions.  Future Appointments  Date Time Provider [place]  01/12/2019 10:45 AM [facility] LAB 4 [facility] None  01/12/2019 11:00 AM [facility] [place] FLUSH [facility] None  01/12/2019 11:30 AM Heilingoetter, Cassandra L, PA-C [facility] None  01/12/2019 12:30 PM [facility] INFUSION [facility] None  02/02/2019  8:00 AM [facility] LAB 6 [facility] None  02/02/2019  8:15 AM [facility] [place] FLUSH [facility] None  02/02/2019  8:45 AM Curt Bears, MD [facility] None  02/02/2019  9:30 AM [facility] INFUSION [facility] None  02/23/2019 10:00 AM [facility] LAB 4 [facility] None  02/23/2019 10:15 AM [facility] [place] FLUSH [facility] None  02/23/2019 10:45 AM Curt Bears, MD [facility] None  02/23/2019 11:30 AM [facility] INFUSION [facility] None  04/07/2019  9:15 AM Hayden Pedro, MD [facility] None    No orders of the defined types were placed in this encounter.      Subjective:   Patient ID:  Tony [name] is a 78 y.o. (DOB  [birth year]) male.  Chief Complaint: No chief complaint on file.   HPI Tony [name]  is a 78 year old male with a history of a metastatic adenocarcinoma of the lung who is managed by Dr. Earlie Server and is status post cycle 5, day 1 of Alimta and Keytruda which was dosed on 12/22/2018.  The patient has a history of gout and reports that Tony [name] has been having pain in his left foot.  Tony [name] has been taking colchicine.  Despite this Tony [name] continues to have pain, erythema, and swelling in his left foot.  Tony [name] denies fevers, chills, sweats, or recent injuries to his foot.  Medications: I have reviewed the patient's current medications.  Allergies: No Known Allergies  Past Medical History:  Diagnosis Date  . Anemia   . Brain metastases ([place])   . CAD (coronary artery disease)   . Constipation   . Gout   . Heart disease   . History of blood transfusion   . History of radiation therapy 09/01/2018   brain, cerebella (5 sites)/ 20 Gy in 1 fraction.  Marland Kitchen HLD (hyperlipidemia) 10/13/2018  . HTN (hypertension) 10/13/2018  . Hyperlipidemia   . Hypertension   . Lung cancer ([place])   . Mass of lower lobe of right lung   . Myocardial infarction ([place])    MI in 2000  . Peripheral vascular disease ([facility])    Abdominal Aortic Aneurysm  . Prostate cancer [facility]) Nov. 2012  . Ulcer     Past Surgical History:  Procedure Laterality Date  . ANAL FISSURECTOMY    . COLONOSCOPY    .  CORONARY ARTERY BYPASS GRAFT     2000  . EYE SURGERY Left Nov. 2013   Cataract  . EYE SURGERY Right Feb. 2014   Cataract  . HERNIA REPAIR    . PORTACATH PLACEMENT Left 09/24/2018   Procedure: INSERTION PORT-A-CATH;  Surgeon: Melrose Nakayama, MD;  Location: [facility];  Service: Thoracic;  Laterality: Left;  . PR VEIN BYPASS GRAFT,AORTO-FEM-POP  2000  . PROSTATE SURGERY  Nov. 2012  . ROBOT ASSISTED LAPAROSCOPIC RADICAL PROSTATECTOMY  08/08/2011   Procedure: ROBOTIC ASSISTED LAPAROSCOPIC RADICAL PROSTATECTOMY LEVEL 2;  Surgeon: Dutch Gray,  MD;  Location: [facility];  Service: Urology;  Laterality: Bilateral;  with Bilateral Pelvic Lymphadenectomy   . SHOULDER SURGERY  1980'S  . VIDEO BRONCHOSCOPY WITH ENDOBRONCHIAL ULTRASOUND N/A 08/06/2018   Procedure: VIDEO BRONCHOSCOPY WITH ENDOBRONCHIAL ULTRASOUND;  Surgeon: Melrose Nakayama, MD;  Location: [facility] OR;  Service: Thoracic;  Laterality: N/A;  . WISDOM TOOTH EXTRACTION      Family History  Problem Relation Age of Onset  . Diabetes Mother   . Heart disease Mother        CHF  . Heart disease Father   . Heart failure Father        Amputation- Leg  . Deep vein thrombosis Father   . Heart disease Brother        Heart Disease before age 25  . Heart failure Brother   . Hyperlipidemia Brother   . Hypertension Brother   . Heart disease Sister   . Diabetes Sister   . Heart failure Sister     Social History   Socioeconomic History  . Marital status: Married    Spouse name: Not on file  . Number of children: Not on file  . Years of education: Not on file  . Highest education level: Not on file  Occupational History  . Not on file  Social Needs  . Financial resource strain: Not on file  . Food insecurity:    Worry: Not on file    Inability: Not on file  . Transportation needs:    Medical: Not on file    Non-medical: Not on file  Tobacco Use  . Smoking status: Former Smoker    Packs/day: 1.50    Years: 43.00    Pack years: 64.50    Types: Cigarettes    Last attempt to quit: 09/23/1998    Years since quitting: 20.2  . Smokeless tobacco: Never Used  Substance and Sexual Activity  . Alcohol use: Yes    Alcohol/week: 7.0 standard drinks    Types: 7 Glasses of wine per week    Comment: half glass per day  . Drug use: No  . Sexual activity: Not on file  Lifestyle  . Physical activity:    Days per week: Not on file    Minutes per session: Not on file  . Stress: Not on file  Relationships  . Social connections:    Talks on phone: Not on file    Gets together:  Not on file    Attends religious service: Not on file    Active member of club or organization: Not on file    Attends meetings of clubs or organizations: Not on file    Relationship status: Not on file  . Intimate partner violence:    Fear of current or ex partner: Not on file    Emotionally abused: Not on file    Physically abused: Not on file  Forced sexual activity: Not on file  Other Topics Concern  . Not on file  Social History Narrative  . Not on file    Past Medical History, Surgical history, Social history, and Family history were reviewed and updated as appropriate.   Please see review of systems for further details on the patient's review from today.   Review of Systems:  Review of Systems  Constitutional: Negative for chills, diaphoresis and fever.  HENT: Negative for facial swelling and trouble swallowing.   Respiratory: Negative for cough, chest tightness and shortness of breath.   Cardiovascular: Negative for chest pain.  Musculoskeletal:       Swelling, erythema, and pain of the left foot.  Skin: Positive for color change. Negative for rash and wound.    Objective:   Physical Exam:  BP 140/65 (BP Location: Left Arm, Patient Position: Sitting)   Pulse 80   Temp 98.3 F (36.8 C) (Oral)   Resp 18   Ht 5\' 9"  (1.753 m)   Wt 164 lb 3.2 oz (74.5 kg)   SpO2 100%   BMI 24.25 kg/m  ECOG: 1  Physical Exam Constitutional:      General: Tony [name] is not in acute distress.    Appearance: Tony [name] is not diaphoretic.  HENT:     Head: Normocephalic and atraumatic.  Eyes:     General: No scleral icterus.       Right eye: No discharge.        Left eye: No discharge.     Conjunctiva/sclera: Conjunctivae normal.  Cardiovascular:     Rate and Rhythm: Normal rate and regular rhythm.     Heart sounds: Normal heart sounds. No murmur. No friction rub. No gallop.   Pulmonary:     Effort: Pulmonary effort is normal. No respiratory distress.     Breath sounds: Normal breath  sounds. No stridor. No wheezing or rales.  Musculoskeletal:        General: No deformity.  Skin:    General: Skin is warm and dry.     Findings: Erythema present. No rash.     Comments: Erythema, swelling, and increased warmth of the left dorsal foot.  Neurological:     Mental Status: Tony [name] is alert.     Coordination: Coordination normal.     Gait: Gait normal.  Psychiatric:        Mood and Affect: Mood normal.        Behavior: Behavior normal.        Thought Content: Thought content normal.        Judgment: Judgment normal.     Lab Review:     Component Value Date/Time   NA 135 12/29/2018 1243   NA 139 08/10/2012 1328   K 4.3 12/29/2018 1243   K 4.1 08/10/2012 1328   CL 100 12/29/2018 1243   CL 105 08/10/2012 1328   CO2 26 12/29/2018 1243   CO2 29 08/10/2012 1328   GLUCOSE 114 (H) 12/29/2018 1243   GLUCOSE 112 (H) 08/10/2012 1328   BUN 22 12/29/2018 1243   BUN 18.0 08/10/2012 1328   CREATININE 0.87 12/29/2018 1243   CREATININE 1.1 08/10/2012 1328   CALCIUM 9.0 12/29/2018 1243   CALCIUM 9.0 08/10/2012 1328   PROT 6.8 12/29/2018 1243   PROT 6.1 (L) 08/10/2012 1328   ALBUMIN 3.0 (L) 12/29/2018 1243   ALBUMIN 3.2 (L) 08/10/2012 1328   AST 28 12/29/2018 1243   AST 42 (H) 08/10/2012 1328   ALT 22 12/29/2018  1243   ALT 55 08/10/2012 1328   ALKPHOS 89 12/29/2018 1243   ALKPHOS 144 08/10/2012 1328   BILITOT 0.6 12/29/2018 1243   BILITOT 0.91 08/10/2012 1328   GFRNONAA >60 12/29/2018 1243   GFRAA >60 12/29/2018 1243       Component Value Date/Time   WBC 1.9 (L) 12/29/2018 1243   WBC 1.2 (LL) 10/14/2018 0448   RBC 2.34 (L) 12/29/2018 1243   HGB 7.4 (L) 12/29/2018 1243   HGB 14.7 05/11/2013 1235   HCT 22.9 (L) 12/29/2018 1243   HCT 42.5 05/11/2013 1235   PLT 121 (L) 12/29/2018 1243   PLT 131 (L) 05/11/2013 1235   MCV 97.9 12/29/2018 1243   MCV 100.7 (H) 05/11/2013 1235   MCH 31.6 12/29/2018 1243   MCHC 32.3 12/29/2018 1243   RDW 18.3 (H) 12/29/2018 1243   RDW  12.8 05/11/2013 1235   LYMPHSABS 0.7 12/29/2018 1243   LYMPHSABS 2.1 05/11/2013 1235   MONOABS 0.1 12/29/2018 1243   MONOABS 0.5 05/11/2013 1235   EOSABS 0.0 12/29/2018 1243   EOSABS 0.1 05/11/2013 1235   BASOSABS 0.0 12/29/2018 1243   BASOSABS 0.1 05/11/2013 1235   -------------------------------  Imaging from last 24 hours (if applicable):  Radiology interpretation: Mr Jeri Cos YW Contrast  Result Date: 12/03/2018 CLINICAL DATA:  Follow-up of treated metastatic lung cancer. PRIOR THERAPY: 1) palliative radiation to the right neck and shoulder. Tony [name] is scheduled to have [place] to the brain lesions on 09/01/2018. CURRENT THERAPY: 1) palliative systemic chemotherapy with carboplatin for an AUC of 5, Alimta 500 mg/m, and Keytruda 200 mg IV every 3 weeks. First dose September 30, 2018. Status post 3 cycles. EXAM: MRI HEAD WITHOUT AND WITH CONTRAST TECHNIQUE: Multiplanar, multiecho pulse sequences of the brain and surrounding structures were obtained without and with intravenous contrast. CONTRAST:  7.5 mL Gadavist COMPARISON:  Brain MRI 08/22/2018, 08/01/2018 FINDINGS: BRAIN: The 3 previously seen contrast-enhancing lesions within the cerebellum have substantially resolved. There are no new parenchymal lesions. 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 No acute or chronic hemorrhage. No age advanced or lobar predominant atrophy pattern. VASCULAR: Major intracranial arterial and venous sinus flow voids are normal. SKULL AND UPPER CERVICAL SPINE: Enhancement within the left C1 lesion is decreased from the prior study. No new osseous lesions. SINUSES/ORBITS: No fluid levels or advanced mucosal thickening. No mastoid or middle ear effusion. The orbits are normal. IMPRESSION: 1. Resolution of contrast-enhancing lesions within the cerebellum. No new parenchymal lesions. 2. 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 Electronically Signed    By: Ulyses Jarred M.D.   On: 12/03/2018 15:05

## 2019-01-05 ENCOUNTER — Other Ambulatory Visit: Payer: Medicare Other

## 2019-01-12 ENCOUNTER — Other Ambulatory Visit: Payer: Self-pay

## 2019-01-12 ENCOUNTER — Inpatient Hospital Stay (HOSPITAL_BASED_OUTPATIENT_CLINIC_OR_DEPARTMENT_OTHER): Payer: Medicare Other | Admitting: Physician Assistant

## 2019-01-12 ENCOUNTER — Inpatient Hospital Stay: Payer: Medicare Other

## 2019-01-12 ENCOUNTER — Encounter: Payer: Self-pay | Admitting: Physician Assistant

## 2019-01-12 VITALS — BP 133/56 | HR 65 | Temp 97.8°F | Resp 19 | Ht 69.0 in | Wt 164.3 lb

## 2019-01-12 DIAGNOSIS — C7931 Secondary malignant neoplasm of brain: Secondary | ICD-10-CM | POA: Diagnosis not present

## 2019-01-12 DIAGNOSIS — C3491 Malignant neoplasm of unspecified part of right bronchus or lung: Secondary | ICD-10-CM

## 2019-01-12 DIAGNOSIS — C7971 Secondary malignant neoplasm of right adrenal gland: Secondary | ICD-10-CM

## 2019-01-12 DIAGNOSIS — Z95828 Presence of other vascular implants and grafts: Secondary | ICD-10-CM

## 2019-01-12 DIAGNOSIS — Z5111 Encounter for antineoplastic chemotherapy: Secondary | ICD-10-CM

## 2019-01-12 DIAGNOSIS — C7951 Secondary malignant neoplasm of bone: Secondary | ICD-10-CM | POA: Diagnosis not present

## 2019-01-12 DIAGNOSIS — Z5112 Encounter for antineoplastic immunotherapy: Secondary | ICD-10-CM

## 2019-01-12 DIAGNOSIS — C7972 Secondary malignant neoplasm of left adrenal gland: Secondary | ICD-10-CM

## 2019-01-12 DIAGNOSIS — I10 Essential (primary) hypertension: Secondary | ICD-10-CM

## 2019-01-12 DIAGNOSIS — C771 Secondary and unspecified malignant neoplasm of intrathoracic lymph nodes: Secondary | ICD-10-CM | POA: Diagnosis not present

## 2019-01-12 DIAGNOSIS — C3431 Malignant neoplasm of lower lobe, right bronchus or lung: Secondary | ICD-10-CM | POA: Diagnosis not present

## 2019-01-12 LAB — CBC WITH DIFFERENTIAL (CANCER CENTER ONLY)
Abs Immature Granulocytes: 0.03 10*3/uL (ref 0.00–0.07)
Basophils Absolute: 0 10*3/uL (ref 0.0–0.1)
Basophils Relative: 0 %
Eosinophils Absolute: 0 10*3/uL (ref 0.0–0.5)
Eosinophils Relative: 1 %
HCT: 26.2 % — ABNORMAL LOW (ref 39.0–52.0)
Hemoglobin: 8.1 g/dL — ABNORMAL LOW (ref 13.0–17.0)
Immature Granulocytes: 1 %
Lymphocytes Relative: 23 %
Lymphs Abs: 1.1 10*3/uL (ref 0.7–4.0)
MCH: 32.9 pg (ref 26.0–34.0)
MCHC: 30.9 g/dL (ref 30.0–36.0)
MCV: 106.5 fL — ABNORMAL HIGH (ref 80.0–100.0)
Monocytes Absolute: 0.6 10*3/uL (ref 0.1–1.0)
Monocytes Relative: 13 %
Neutro Abs: 3.1 10*3/uL (ref 1.7–7.7)
Neutrophils Relative %: 62 %
Platelet Count: 164 10*3/uL (ref 150–400)
RBC: 2.46 MIL/uL — ABNORMAL LOW (ref 4.22–5.81)
RDW: 24 % — ABNORMAL HIGH (ref 11.5–15.5)
WBC Count: 4.9 10*3/uL (ref 4.0–10.5)
nRBC: 0 % (ref 0.0–0.2)

## 2019-01-12 LAB — CMP (CANCER CENTER ONLY)
ALT: 52 U/L — ABNORMAL HIGH (ref 0–44)
AST: 43 U/L — ABNORMAL HIGH (ref 15–41)
Albumin: 3.1 g/dL — ABNORMAL LOW (ref 3.5–5.0)
Alkaline Phosphatase: 98 U/L (ref 38–126)
Anion gap: 10 (ref 5–15)
BUN: 14 mg/dL (ref 8–23)
CO2: 24 mmol/L (ref 22–32)
Calcium: 8.9 mg/dL (ref 8.9–10.3)
Chloride: 104 mmol/L (ref 98–111)
Creatinine: 0.89 mg/dL (ref 0.61–1.24)
GFR, Est AFR Am: >60 mL/min (ref >60)
GFR, Estimated: >60 mL/min (ref >60)
Glucose, Bld: 112 mg/dL — ABNORMAL HIGH (ref 70–99)
Potassium: 3.9 mmol/L (ref 3.5–5.1)
Sodium: 138 mmol/L (ref 135–145)
Total Bilirubin: 0.3 mg/dL (ref 0.3–1.2)
Total Protein: 6.9 g/dL (ref 6.5–8.1)

## 2019-01-12 MED ORDER — PROCHLORPERAZINE MALEATE 10 MG PO TABS
ORAL_TABLET | ORAL | Status: AC
  Filled 2019-01-12: qty 1

## 2019-01-12 MED ORDER — SODIUM CHLORIDE 0.9 % IV SOLN
200.0000 mg | Freq: Once | INTRAVENOUS | Status: AC
  Administered 2019-01-12: 200 mg via INTRAVENOUS
  Filled 2019-01-12: qty 8

## 2019-01-12 MED ORDER — SODIUM CHLORIDE 0.9 % IV SOLN
410.0000 mg/m2 | Freq: Once | INTRAVENOUS | Status: AC
  Administered 2019-01-12: 800 mg via INTRAVENOUS
  Filled 2019-01-12: qty 20

## 2019-01-12 MED ORDER — CYANOCOBALAMIN 1000 MCG/ML IJ SOLN
1000.0000 ug | Freq: Once | INTRAMUSCULAR | Status: AC
  Administered 2019-01-12: 1000 ug via INTRAMUSCULAR

## 2019-01-12 MED ORDER — PROCHLORPERAZINE MALEATE 10 MG PO TABS
10.0000 mg | ORAL_TABLET | Freq: Once | ORAL | Status: AC
  Administered 2019-01-12: 10 mg via ORAL

## 2019-01-12 MED ORDER — SODIUM CHLORIDE 0.9% FLUSH
10.0000 mL | INTRAVENOUS | Status: DC | PRN
  Administered 2019-01-12: 14:00:00 10 mL
  Filled 2019-01-12: qty 10

## 2019-01-12 MED ORDER — CYANOCOBALAMIN 1000 MCG/ML IJ SOLN
INTRAMUSCULAR | Status: AC
  Filled 2019-01-12: qty 1

## 2019-01-12 MED ORDER — HEPARIN SOD (PORK) LOCK FLUSH 100 UNIT/ML IV SOLN
500.0000 [IU] | Freq: Once | INTRAVENOUS | Status: AC | PRN
  Administered 2019-01-12: 14:00:00 500 [IU]
  Filled 2019-01-12: qty 5

## 2019-01-12 MED ORDER — SODIUM CHLORIDE 0.9 % IV SOLN
Freq: Once | INTRAVENOUS | Status: AC
  Administered 2019-01-12: 13:00:00 via INTRAVENOUS
  Filled 2019-01-12: qty 250

## 2019-01-12 MED ORDER — SODIUM CHLORIDE 0.9% FLUSH
10.0000 mL | Freq: Once | INTRAVENOUS | Status: AC
  Administered 2019-01-12: 11:00:00 10 mL
  Filled 2019-01-12: qty 10

## 2019-01-12 NOTE — Progress Notes (Signed)
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  Molecular Studies by [facility]:Showed no actionable mutations.  PRIOR THERAPY: 1)palliative radiation to the right neck and shoulder.He is scheduled to have [place] to the brain lesions on 09/01/2018.  CURRENT THERAPY: 1)palliative systemic chemotherapy with carboplatin for an AUC of 5, Alimta 400 mg/m, and Keytruda 200 mg IV every 3 weeks. First dose September 30, 2018.  Status post 5 cycles.  Starting from cycle #5 the patient will be treated with maintenance Alimta and Keytruda.  INTERVAL HISTORY: Tony [name] 78 y.o. male returns to the clinic today for a follow-up visit.  The patient is feeling fairly well today without any concerning complaints except for fatigue which is mild/moderately improved from prior.  He was recently seen in the symptom management clinic for cellulitis in his left foot.  He was given a prescription for doxycycline. He also recently had a gout flare and was put on colchicine. He denies any more pain or swelling in his left foot, however, he has some peeling skin. He denies any fever, swelling, tenderness, or pain.   He continues to tolerate his chemotherapy and immunotherapy well without any adverse effects except for fatigue.  He denies any fever, chills, night sweats, or weight loss.  He denies any chest pain, cough, or hemoptysis. He reports his baseline shortness of breath. He denies any nausea, vomiting, or diarrhea. He frequently has constipation for which he takes Miralax. His last bowel movement was this morning. He denies any headache or visual changes. He denies any rashes or  skin changes besides the cellulitis on his left foot.  His hemoglobin was found to be 7.4 on routine labs 2 weeks ago. Due to the shortage of blood products, we cannot transfuse him unless his hemoglobin is <7.  He is currently asymptomatic except for fatigue.  He is here today for evaluation before starting cycle #6.    MEDICAL HISTORY: Past Medical History:  Diagnosis Date  . Anemia   . Brain metastases ([facility])   . CAD (coronary artery disease)   . Constipation   . Gout   . Heart disease   . History of blood transfusion   . History of radiation therapy 09/01/2018   brain, cerebella (5 sites)/ 20 Gy in 1 fraction.  Marland Kitchen HLD (hyperlipidemia) 10/13/2018  . HTN (hypertension) 10/13/2018  . Hyperlipidemia   . Hypertension   . Lung cancer ([facility])   . Mass of lower lobe of right lung   . Myocardial infarction ([facility])    MI in 2000  . Peripheral vascular disease ([facility])    Abdominal Aortic Aneurysm  . Prostate cancer [facility]) Nov. 2012  . Ulcer     ALLERGIES:  has No Known Allergies.  MEDICATIONS:  Current Outpatient Medications  Medication Sig Dispense Refill  . acetaminophen (TYLENOL) 650 MG CR tablet Take 650 mg by mouth daily as needed for pain.     Marland Kitchen aspirin 81 MG tablet Take 81 mg by mouth every evening.     . colchicine 0.6 MG tablet Take 0.6 mg by mouth daily.    . folic acid (FOLVITE) 573 MCG tablet Take 800 mcg by mouth daily.     Marland Kitchen  metoprolol tartrate (LOPRESSOR) 50 MG tablet Take 50 mg by mouth 2 (two) times daily.    . Multiple Vitamins-Minerals (MULTIVITAMIN WITH MINERALS) tablet Take 1 tablet by mouth daily.    . niacin 500 MG tablet Take 500 mg by mouth 2 (two) times daily.    . Omega-3 Fatty Acids (FISH OIL) 1200 MG CAPS Take 1,200 mg by mouth daily.     Marland Kitchen omeprazole (PRILOSEC OTC) 20 MG tablet Take 10 mg by mouth 2 (two) times a week.    . polyethylene glycol (MIRALAX / GLYCOLAX) packet Take 17 g by mouth 4 (four) times a week.     . pravastatin (PRAVACHOL) 10 MG tablet Take 10  mg by mouth every evening.     . Psyllium (METAMUCIL FIBER PO) Take 1 Dose by mouth 3 (three) times a week.     . ramipril (ALTACE) 10 MG capsule Take 10 mg by mouth every morning.     . sulfamethoxazole-trimethoprim (BACTRIM DS,SEPTRA DS) 800-160 MG tablet Take 1 tablet by mouth 2 (two) times daily. 14 tablet 0  . triamterene-hydrochlorothiazide (MAXZIDE-25) 37.5-25 MG tablet Take 0.5 tablets by mouth every morning.     Marland Kitchen doxycycline (VIBRA-TABS) 100 MG tablet Take 1 tablet (100 mg total) by mouth 2 (two) times daily. (Patient not taking: Reported on 01/12/2019) 14 tablet 0  . mupirocin ointment (BACTROBAN) 2 % Place 1 application into the nose 3 (three) times daily. Place over area 3 times daily (Patient not taking: Reported on 12/01/2018) 30 g 0   No current facility-administered medications for this visit.    Facility-Administered Medications Ordered in Other Visits  Medication Dose Route Frequency Provider Last Rate Last Dose  . heparin lock flush 100 unit/mL  500 Units Intracatheter Once PRN Curt Bears, MD      . PEMEtrexed (ALIMTA) 800 mg in sodium chloride 0.9 % 100 mL chemo infusion  410 mg/m2 (Treatment Plan Recorded) Intravenous Once Curt Bears, MD 792 mL/hr at 01/12/19 1355 800 mg at 01/12/19 1355  . sodium chloride flush (NS) 0.9 % injection 10 mL  10 mL Intracatheter PRN Curt Bears, MD        SURGICAL HISTORY:  Past Surgical History:  Procedure Laterality Date  . ANAL FISSURECTOMY    . COLONOSCOPY    . CORONARY ARTERY BYPASS GRAFT     2000  . EYE SURGERY Left Nov. 2013   Cataract  . EYE SURGERY Right Feb. 2014   Cataract  . HERNIA REPAIR    . PORTACATH PLACEMENT Left 09/24/2018   Procedure: INSERTION PORT-A-CATH;  Surgeon: Melrose Nakayama, MD;  Location: [facility];  Service: Thoracic;  Laterality: Left;  . PR VEIN BYPASS GRAFT,AORTO-FEM-POP  2000  . PROSTATE SURGERY  Nov. 2012  . ROBOT ASSISTED LAPAROSCOPIC RADICAL PROSTATECTOMY  08/08/2011   Procedure:  ROBOTIC ASSISTED LAPAROSCOPIC RADICAL PROSTATECTOMY LEVEL 2;  Surgeon: Dutch Gray, MD;  Location: [facility];  Service: Urology;  Laterality: Bilateral;  with Bilateral Pelvic Lymphadenectomy   . SHOULDER SURGERY  1980'S  . VIDEO BRONCHOSCOPY WITH ENDOBRONCHIAL ULTRASOUND N/A 08/06/2018   Procedure: VIDEO BRONCHOSCOPY WITH ENDOBRONCHIAL ULTRASOUND;  Surgeon: Melrose Nakayama, MD;  Location: [facility];  Service: Thoracic;  Laterality: N/A;  . WISDOM TOOTH EXTRACTION      REVIEW OF SYSTEMS:   Review of Systems  Constitutional: Positive for fatigue. Negative for appetite change, chills, fever and unexpected weight change.  HENT:   Negative for mouth sores, nosebleeds, sore throat and trouble swallowing.  Eyes: Negative for eye problems and icterus.  Respiratory: Positive for baseline shortness of breath. Negative for cough, hemoptysis, and wheezing.   Cardiovascular: Negative for chest pain and leg swelling.  Gastrointestinal: Positive for frequent constipation. Negative for abdominal pain, diarrhea, nausea and vomiting.  Genitourinary: Negative for bladder incontinence, difficulty urinating, dysuria, frequency and hematuria.   Musculoskeletal: Negative for back pain, gait problem, neck pain and neck stiffness.  Skin: Positive for skin peeling/mild erythema of his left foot. Negative for itching and other skin rash.  Neurological: Negative for dizziness, extremity weakness, gait problem, headaches, light-headedness and seizures.  Hematological: Negative for adenopathy. Does not bruise/bleed easily.  Psychiatric/Behavioral: Negative for confusion, depression and sleep disturbance. The patient is not nervous/anxious.     PHYSICAL EXAMINATION:  Blood pressure (!) 133/56, pulse 65, temperature 97.8 F (36.6 C), temperature source Oral, resp. rate 19, height 5\' 9"  (1.753 m), weight 164 lb 4.8 oz (74.5 kg), SpO2 100 %.  ECOG PERFORMANCE STATUS: 1 - Symptomatic but completely ambulatory  Physical  Exam  Constitutional: Oriented to person, place, and time and well-developed, well-nourished, and in no distress.  HENT:  Head: Normocephalic and atraumatic.  Mouth/Throat: Oropharynx is clear and moist. No oropharyngeal exudate.  Eyes: Conjunctivae are normal. Right eye exhibits no discharge. Left eye exhibits no discharge. No scleral icterus.  Neck: Normal range of motion. Neck supple.  Cardiovascular: Normal rate, regular rhythm, normal heart sounds and intact distal pulses.   Pulmonary/Chest: Effort normal and breath sounds normal. No respiratory distress. No wheezes. No rales.  Abdominal: Soft. Bowel sounds are normal. Exhibits no distension and no mass. There is no tenderness.  Musculoskeletal: Normal range of motion. Exhibits no edema.  Lymphadenopathy:    No cervical adenopathy.  Neurological: Alert and oriented to person, place, and time. Exhibits normal muscle tone. Gait normal. Coordination normal.  Skin: Skin peeling over the dorsal aspect of the left foot with mild erythema. Skin is warm and dry. Not diaphoretic. No pallor.  Psychiatric: Mood, memory and judgment normal.  Vitals reviewed.  LABORATORY DATA: Lab Results  Component Value Date   WBC 4.9 01/12/2019   HGB 8.1 (L) 01/12/2019   HCT 26.2 (L) 01/12/2019   MCV 106.5 (H) 01/12/2019   PLT 164 01/12/2019      Chemistry      Component Value Date/Time   NA 138 01/12/2019 1101   NA 139 08/10/2012 1328   K 3.9 01/12/2019 1101   K 4.1 08/10/2012 1328   CL 104 01/12/2019 1101   CL 105 08/10/2012 1328   CO2 24 01/12/2019 1101   CO2 29 08/10/2012 1328   BUN 14 01/12/2019 1101   BUN 18.0 08/10/2012 1328   CREATININE 0.89 01/12/2019 1101   CREATININE 1.1 08/10/2012 1328      Component Value Date/Time   CALCIUM 8.9 01/12/2019 1101   CALCIUM 9.0 08/10/2012 1328   ALKPHOS 98 01/12/2019 1101   ALKPHOS 144 08/10/2012 1328   AST 43 (H) 01/12/2019 1101   AST 42 (H) 08/10/2012 1328   ALT 52 (H) 01/12/2019 1101   ALT  55 08/10/2012 1328   BILITOT 0.3 01/12/2019 1101   BILITOT 0.91 08/10/2012 1328       RADIOGRAPHIC STUDIES:  No results found.   ASSESSMENT/PLAN:  This is a very pleasant 78 year old Caucasian male with stage IV non-small cell lung cancer, adenocarcinoma.  He presented with a large right lower lobe lung mass in addition to mediastinal lymphadenopathy, metastatic disease to the adrenal glands,  as well as several metastatic bone lesions including the right shoulder, lumbar spine, and pelvic bones.  The patient has no actionable mutations.  He underwent palliative radiotherapy to the right shoulder and painful metastatic bone lesions. He is feeling much better after the radiation therapy. He is currently undergoing systemic chemotherapy with carboplatin for an AUC of 5, Alimta 500 mg/m, and Keytruda 200 mg IV every 3 weeks.  He is status post 5 cycles.  Starting from cycle #5 he has received maintenance treatment with Alimta and Keytruda.   The patient was seen with Dr. Julien Nordmann today.  His hemoglobin is 8.1 today. Dr. Julien Nordmann recommends decreasing his dose of Alimta to 400 mg/m2 today due to his history of chemotherapy induced anemia. We will continue to monitor his CBC on routine labs.  We will consider transfusion globin is less than 7.  We recommend he proceed with cycle #6 today as scheduled.  I will arrange for a restaging CT scan to be performed prior to the next visit. I will see him back in 3 weeks for evaluation and to discuss his scan results.  The patient was advised to call immediately if he has any concerning symptoms in the interval. The patient voices understanding of current disease status and treatment options and is in agreement with the current care plan. All questions were answered. The patient knows to call the clinic with any problems, questions or concerns. We can certainly see the patient much sooner if necessary  Orders Placed This Encounter  Procedures  . CT Abdomen  W Contrast    Standing Status:   Future    Standing Expiration Date:   01/12/2020    Order Specific Question:   ** REASON FOR EXAM (FREE TEXT)    Answer:   Restaging Lung Cancer    Order Specific Question:   If indicated for the ordered procedure, I authorize the administration of contrast media per Radiology protocol    Answer:   Yes    Order Specific Question:   Preferred imaging location?    Answer:   [facility]    Order Specific Question:   Is Oral Contrast requested for this exam?    Answer:   Yes, Per Radiology protocol    Order Specific Question:   Radiology Contrast Protocol - do NOT remove file path    Answer:   \\[email or web address]  . CT Chest W Contrast    Standing Status:   Future    Standing Expiration Date:   01/12/2020    Order Specific Question:   ** REASON FOR EXAM (FREE TEXT)    Answer:   Restaging Lung Cancer    Order Specific Question:   If indicated for the ordered procedure, I authorize the administration of contrast media per Radiology protocol    Answer:   Yes    Order Specific Question:   Preferred imaging location?    Answer:   [facility]    Order Specific Question:   Radiology Contrast Protocol - do NOT remove file path    Answer:   \\[email or web address]      L , PA-C 01/12/19  ADDENDUM: Hematology/Oncology Attending: I had a face-to-face encounter with the patient.  I recommended his care plan.  This is a very pleasant 78 years old white male with metastatic non-small cell lung cancer, adenocarcinoma status post induction treatment with carboplatin, Alimta and Keytruda for 4 cycles.  He is currently undergoing maintenance treatment with Alimta and Keytruda status post  1 cycle.  He tolerated the first cycle of this treatment well. It was recently treated with doxycycline for questionable cellulitis of the left leg.  He is feeling much better but he has some discoloration of  the skin on the area of the previous cellulitis. I recommended for the patient to continue his current treatment with Alimta and Keytruda and he will proceed with cycle #6 today. I will see the patient back for follow-up visit in 3 weeks for evaluation with repeat CT scan of the chest, abdomen pelvis for restaging of his disease before starting cycle #7 of his treatment. The patient was advised to call immediately if he has any concerning symptoms in the interval.  Disclaimer: This note was dictated with voice recognition software. Similar sounding words can inadvertently be transcribed and may be missed upon review. Eilleen Kempf, MD 01/12/19

## 2019-01-12 NOTE — Patient Instructions (Signed)
Coronavirus (COVID-19) Are you at risk?  Are you at risk for the Coronavirus (COVID-19)?  To be considered HIGH RISK for Coronavirus (COVID-19), you have to meet the following criteria:  . Traveled to [place], [place], [place], [place] or [place]; or in the [place] to [place], [place], [place], or [place]; and have fever, cough, and shortness of breath within the last 2 weeks of travel OR . Been in close contact with a person diagnosed with COVID-19 within the last 2 weeks and have fever, cough, and shortness of breath . IF YOU DO NOT MEET THESE CRITERIA, YOU ARE CONSIDERED LOW RISK FOR COVID-19.  What to do if you are HIGH RISK for COVID-19?  . If you are having a medical emergency, call 911. . Seek medical care right away. Before you go to a doctor's office, urgent care or emergency department, call ahead and tell them about your recent travel, contact with someone diagnosed with COVID-19, and your symptoms. You should receive instructions from your physician's office regarding next steps of care.  . When you arrive at healthcare provider, tell the healthcare staff immediately you have returned from visiting [place], [place], [place], [place] or [place]; or traveled in the [place] to [place], [place], [place], or [place]; in the last two weeks or you have been in close contact with a person diagnosed with COVID-19 in the last 2 weeks.   . Tell the health care staff about your symptoms: fever, cough and shortness of breath. . After you have been seen by a medical provider, you will be either: o Tested for (COVID-19) and discharged home on quarantine except to seek medical care if symptoms worsen, and asked to  - Stay home and avoid contact with others until you get your results (4-5 days)  - Avoid travel on public transportation if possible (such as bus, train, or airplane) or o Sent to the Emergency Department by EMS for evaluation, COVID-19 testing, and possible  admission depending on your condition and test results.  What to do if you are LOW RISK for COVID-19?  Reduce your risk of any infection by using the same precautions used for avoiding the common cold or flu:  . Wash your hands often with soap and warm water for at least 20 seconds.  If soap and water are not readily available, use an alcohol-based hand sanitizer with at least 60% alcohol.  . If coughing or sneezing, cover your mouth and nose by coughing or sneezing into the elbow areas of your shirt or coat, into a tissue or into your sleeve (not your hands). . Avoid shaking hands with others and consider head nods or verbal greetings only. . Avoid touching your eyes, nose, or mouth with unwashed hands.  . Avoid close contact with people who are sick. . Avoid places or events with large numbers of people in one location, like concerts or sporting events. . Carefully consider travel plans you have or are making. . If you are planning any travel outside or inside the [place], visit the CDC's Travelers' Health webpage for the latest health notices. . If you have some symptoms but not all symptoms, continue to monitor at home and seek medical attention if your symptoms worsen. . If you are having a medical emergency, call 911.   ADDITIONAL HEALTHCARE OPTIONS FOR PATIENTS  [facility] / e-Visit: [email or web address]         [facility]: [phone]  [place]   Urgent Care: [facility]: [place] Discharge Instructions for Patients Receiving Chemotherapy  Today you received the following chemotherapy agents: Pembrolizumab (Keytruda) and Pemetrexed (Alimta)  To help prevent nausea and vomiting after your treatment, we encourage you to take your nausea medication as directed.    If you develop nausea and vomiting that is not controlled by your nausea  medication, call the clinic.   BELOW ARE SYMPTOMS THAT SHOULD BE REPORTED IMMEDIATELY:  *FEVER GREATER THAN 100.5 F  *CHILLS WITH OR WITHOUT FEVER  NAUSEA AND VOMITING THAT IS NOT CONTROLLED WITH YOUR NAUSEA MEDICATION  *UNUSUAL SHORTNESS OF BREATH  *UNUSUAL BRUISING OR BLEEDING  TENDERNESS IN MOUTH AND THROAT WITH OR WITHOUT PRESENCE OF ULCERS  *URINARY PROBLEMS  *BOWEL PROBLEMS  UNUSUAL RASH Items with * indicate a potential emergency and should be followed up as soon as possible.  Feel free to call the clinic should you have any questions or concerns. The clinic phone number is [phone].  Please show the [facility] at check-in to the Emergency Department and triage nurse.

## 2019-01-20 ENCOUNTER — Other Ambulatory Visit: Payer: Self-pay | Admitting: Medical

## 2019-01-20 ENCOUNTER — Telehealth: Payer: Self-pay | Admitting: Emergency Medicine

## 2019-01-20 MED ORDER — GABAPENTIN 100 MG PO CAPS: 200.0000 mg | ORAL_CAPSULE | Freq: Every day | ORAL | 2 refills | Status: DC

## 2019-01-20 NOTE — Telephone Encounter (Signed)
Returning pt's VM reporting some improvement in foot symptoms with prescribed abx from last [facility] visit, however has stabbing burning pain in bilat feet at night when trying to sleep.  PA Lucianne Lei aware, sent in prescription of Gabapentin to pt's preferred pharmacy to treat possible peripheral neuropathy.  Pt to call back if he doesn't experience relief or pain/foot issues worsen within a few days.  Pt VU of all instructions.

## 2019-01-29 ENCOUNTER — Encounter (HOSPITAL_COMMUNITY): Payer: Self-pay

## 2019-01-29 ENCOUNTER — Other Ambulatory Visit: Payer: Self-pay

## 2019-01-29 ENCOUNTER — Ambulatory Visit (HOSPITAL_COMMUNITY)
Admission: RE | Admit: 2019-01-29 | Discharge: 2019-01-29 | Disposition: A | Discharge status: Home / Self Care | Payer: Medicare Other | Source: Ambulatory Visit | Attending: Physician Assistant | Admitting: Physician Assistant

## 2019-01-29 DIAGNOSIS — C3491 Malignant neoplasm of unspecified part of right bronchus or lung: Secondary | ICD-10-CM | POA: Diagnosis not present

## 2019-01-29 MED ORDER — IOHEXOL 300 MG/ML  SOLN
30.0000 mL | Freq: Once | INTRAMUSCULAR | Status: AC | PRN
  Administered 2019-01-29: 13:00:00 30 mL via ORAL

## 2019-01-29 MED ORDER — SODIUM CHLORIDE (PF) 0.9 % IJ SOLN
INTRAMUSCULAR | Status: AC
  Filled 2019-01-29: qty 50

## 2019-01-29 MED ORDER — IOHEXOL 300 MG/ML  SOLN
100.0000 mL | Freq: Once | INTRAMUSCULAR | Status: AC | PRN
  Administered 2019-01-29: 100 mL via INTRAVENOUS

## 2019-02-02 ENCOUNTER — Inpatient Hospital Stay (HOSPITAL_BASED_OUTPATIENT_CLINIC_OR_DEPARTMENT_OTHER): Payer: Medicare Other | Admitting: Internal Medicine

## 2019-02-02 ENCOUNTER — Inpatient Hospital Stay: Payer: Medicare Other | Attending: Oncology

## 2019-02-02 ENCOUNTER — Encounter: Payer: Self-pay | Admitting: Internal Medicine

## 2019-02-02 ENCOUNTER — Other Ambulatory Visit: Payer: Self-pay

## 2019-02-02 ENCOUNTER — Inpatient Hospital Stay: Payer: Medicare Other

## 2019-02-02 VITALS — BP 124/59 | HR 64 | Temp 97.8°F | Resp 18 | Ht 69.0 in | Wt 162.8 lb

## 2019-02-02 DIAGNOSIS — C7931 Secondary malignant neoplasm of brain: Secondary | ICD-10-CM

## 2019-02-02 DIAGNOSIS — C7951 Secondary malignant neoplasm of bone: Secondary | ICD-10-CM

## 2019-02-02 DIAGNOSIS — Z5111 Encounter for antineoplastic chemotherapy: Secondary | ICD-10-CM | POA: Diagnosis not present

## 2019-02-02 DIAGNOSIS — C3431 Malignant neoplasm of lower lobe, right bronchus or lung: Secondary | ICD-10-CM

## 2019-02-02 DIAGNOSIS — C3491 Malignant neoplasm of unspecified part of right bronchus or lung: Secondary | ICD-10-CM

## 2019-02-02 DIAGNOSIS — R200 Anesthesia of skin: Secondary | ICD-10-CM

## 2019-02-02 DIAGNOSIS — C7972 Secondary malignant neoplasm of left adrenal gland: Secondary | ICD-10-CM

## 2019-02-02 DIAGNOSIS — C7971 Secondary malignant neoplasm of right adrenal gland: Secondary | ICD-10-CM | POA: Diagnosis not present

## 2019-02-02 DIAGNOSIS — I10 Essential (primary) hypertension: Secondary | ICD-10-CM | POA: Diagnosis not present

## 2019-02-02 DIAGNOSIS — D6481 Anemia due to antineoplastic chemotherapy: Secondary | ICD-10-CM | POA: Diagnosis not present

## 2019-02-02 DIAGNOSIS — G629 Polyneuropathy, unspecified: Secondary | ICD-10-CM

## 2019-02-02 DIAGNOSIS — Z5112 Encounter for antineoplastic immunotherapy: Secondary | ICD-10-CM

## 2019-02-02 DIAGNOSIS — Z79899 Other long term (current) drug therapy: Secondary | ICD-10-CM | POA: Insufficient documentation

## 2019-02-02 DIAGNOSIS — C771 Secondary and unspecified malignant neoplasm of intrathoracic lymph nodes: Secondary | ICD-10-CM

## 2019-02-02 DIAGNOSIS — Z95828 Presence of other vascular implants and grafts: Secondary | ICD-10-CM

## 2019-02-02 LAB — CMP (CANCER CENTER ONLY)
ALT: 60 U/L — ABNORMAL HIGH (ref 0–44)
AST: 44 U/L — ABNORMAL HIGH (ref 15–41)
Albumin: 3.3 g/dL — ABNORMAL LOW (ref 3.5–5.0)
Alkaline Phosphatase: 101 U/L (ref 38–126)
Anion gap: 12 (ref 5–15)
BUN: 15 mg/dL (ref 8–23)
CO2: 24 mmol/L (ref 22–32)
Calcium: 9.3 mg/dL (ref 8.9–10.3)
Chloride: 104 mmol/L (ref 98–111)
Creatinine: 0.97 mg/dL (ref 0.61–1.24)
GFR, Est AFR Am: >60 mL/min (ref >60)
GFR, Estimated: >60 mL/min (ref >60)
Glucose, Bld: 95 mg/dL (ref 70–99)
Potassium: 4 mmol/L (ref 3.5–5.1)
Sodium: 140 mmol/L (ref 135–145)
Total Bilirubin: 0.3 mg/dL (ref 0.3–1.2)
Total Protein: 7.3 g/dL (ref 6.5–8.1)

## 2019-02-02 LAB — CBC WITH DIFFERENTIAL (CANCER CENTER ONLY)
Abs Immature Granulocytes: 0.02 10*3/uL (ref 0.00–0.07)
Basophils Absolute: 0 10*3/uL (ref 0.0–0.1)
Basophils Relative: 0 %
Eosinophils Absolute: 0.1 10*3/uL (ref 0.0–0.5)
Eosinophils Relative: 1 %
HCT: 26.6 % — ABNORMAL LOW (ref 39.0–52.0)
Hemoglobin: 8.2 g/dL — ABNORMAL LOW (ref 13.0–17.0)
Immature Granulocytes: 0 %
Lymphocytes Relative: 23 %
Lymphs Abs: 1.1 10*3/uL (ref 0.7–4.0)
MCH: 33.9 pg (ref 26.0–34.0)
MCHC: 30.8 g/dL (ref 30.0–36.0)
MCV: 109.9 fL — ABNORMAL HIGH (ref 80.0–100.0)
Monocytes Absolute: 0.7 10*3/uL (ref 0.1–1.0)
Monocytes Relative: 14 %
Neutro Abs: 3 10*3/uL (ref 1.7–7.7)
Neutrophils Relative %: 62 %
Platelet Count: 187 10*3/uL (ref 150–400)
RBC: 2.42 MIL/uL — ABNORMAL LOW (ref 4.22–5.81)
RDW: 22.2 % — ABNORMAL HIGH (ref 11.5–15.5)
WBC Count: 4.9 10*3/uL (ref 4.0–10.5)
nRBC: 0 % (ref 0.0–0.2)

## 2019-02-02 LAB — TSH: TSH: 1.887 u[IU]/mL (ref 0.320–4.118)

## 2019-02-02 MED ORDER — SODIUM CHLORIDE 0.9 % IV SOLN
200.0000 mg | Freq: Once | INTRAVENOUS | Status: AC
  Administered 2019-02-02: 200 mg via INTRAVENOUS
  Filled 2019-02-02: qty 8

## 2019-02-02 MED ORDER — SODIUM CHLORIDE 0.9% FLUSH
10.0000 mL | Freq: Once | INTRAVENOUS | Status: AC
  Administered 2019-02-02: 10 mL
  Filled 2019-02-02: qty 10

## 2019-02-02 MED ORDER — SODIUM CHLORIDE 0.9% FLUSH
10.0000 mL | INTRAVENOUS | Status: DC | PRN
  Administered 2019-02-02: 11:00:00 10 mL
  Filled 2019-02-02: qty 10

## 2019-02-02 MED ORDER — HEPARIN SOD (PORK) LOCK FLUSH 100 UNIT/ML IV SOLN
500.0000 [IU] | Freq: Once | INTRAVENOUS | Status: AC | PRN
  Administered 2019-02-02: 500 [IU]
  Filled 2019-02-02: qty 5

## 2019-02-02 MED ORDER — SODIUM CHLORIDE 0.9 % IV SOLN
Freq: Once | INTRAVENOUS | Status: AC
  Administered 2019-02-02: 09:00:00 via INTRAVENOUS
  Filled 2019-02-02: qty 250

## 2019-02-02 MED ORDER — PROCHLORPERAZINE MALEATE 10 MG PO TABS
10.0000 mg | ORAL_TABLET | Freq: Once | ORAL | Status: AC
  Administered 2019-02-02: 10 mg via ORAL

## 2019-02-02 MED ORDER — SODIUM CHLORIDE 0.9 % IV SOLN
410.0000 mg/m2 | Freq: Once | INTRAVENOUS | Status: AC
  Administered 2019-02-02: 800 mg via INTRAVENOUS
  Filled 2019-02-02: qty 20

## 2019-02-02 MED ORDER — PROCHLORPERAZINE MALEATE 10 MG PO TABS
ORAL_TABLET | ORAL | Status: AC
  Filled 2019-02-02: qty 1

## 2019-02-02 NOTE — Progress Notes (Signed)
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  Molecular Studies by [facility]: Showed no actionable mutations.  PRIOR THERAPY:1) palliative radiation to the right neck and shoulder.  He is scheduled to have [place] to the brain lesions on 09/01/2018.  CURRENT THERAPY: 1) palliative systemic chemotherapy with carboplatin for an AUC of 5, Alimta 400 mg/m, and Keytruda 200 mg IV every 3 weeks.  First dose September 30, 2018.  Status post 6 cycles.  Starting from cycle #5 the patient will be treated with maintenance Alimta and Keytruda.  INTERVAL HISTORY: Tony [name] 78 y.o. male returns to the clinic today for follow-up visit.  The patient continues to have peeling of the left leg.  He was started on gabapentin but could not tolerate the 2 tablet twice a day.  He stopped his medication.  He denied having any current chest pain but has shortness of breath with exertion with no cough or hemoptysis.  He continues to have fatigue.  The patient denied having any fever or chills.  He has no nausea, vomiting, diarrhea or constipation.  He has been tolerating his treatment with Alimta and Keytruda fairly well.  He is here today for evaluation before starting cycle #7 of his treatment.  He had repeat CT scan of the chest, abdomen and pelvis performed recently.   MEDICAL HISTORY: Past Medical History:  Diagnosis Date   Anemia    Brain metastases (HCC)    CAD (coronary artery disease)    Constipation    Gout    Heart disease    History of blood transfusion    History of radiation therapy 09/01/2018   brain, cerebella (5 sites)/ 20 Gy in 1 fraction.   HLD (hyperlipidemia) 10/13/2018   HTN (hypertension) 10/13/2018   Hyperlipidemia    Hypertension    Lung cancer ([facility])    Mass of lower lobe of right lung    Myocardial infarction [facility])    MI in 2000   Peripheral vascular disease ([facility])    Abdominal Aortic Aneurysm   Prostate cancer ([facility]) Nov. 2012   Ulcer     ALLERGIES:  has No Known Allergies.  MEDICATIONS:  Current Outpatient Medications  Medication Sig Dispense Refill   acetaminophen (TYLENOL) 650 MG CR tablet Take 650 mg by mouth daily as needed for pain.      aspirin 81 MG tablet Take 81 mg by mouth every evening.      colchicine 0.6 MG tablet Take 0.6 mg by mouth daily.     doxycycline (VIBRA-TABS) 100 MG tablet Take 1 tablet (100 mg total) by mouth 2 (two) times daily. (Patient not taking: Reported on 01/12/2019) 14 tablet 0   folic acid (FOLVITE) 650 MCG tablet Take 800 mcg by mouth daily.      gabapentin (NEURONTIN) 100 MG capsule Take 2 capsules (200 mg total) by mouth at bedtime. 60 capsule 2   metoprolol tartrate (LOPRESSOR) 50 MG tablet Take 50 mg by mouth 2 (two) times daily.     Multiple Vitamins-Minerals (MULTIVITAMIN WITH MINERALS) tablet Take 1 tablet by mouth daily.     mupirocin ointment (BACTROBAN) 2 % Place  1 application into the nose 3 (three) times daily. Place over area 3 times daily (Patient not taking: Reported on 12/01/2018) 30 g 0   niacin 500 MG tablet Take 500 mg by mouth 2 (two) times daily.     Omega-3 Fatty Acids (FISH OIL) 1200 MG CAPS Take 1,200 mg by mouth daily.      omeprazole (PRILOSEC OTC) 20 MG tablet Take 10 mg by mouth 2 (two) times a week.     polyethylene glycol (MIRALAX / GLYCOLAX) packet Take 17 g by mouth 4 (four) times a week.      pravastatin (PRAVACHOL) 10 MG tablet Take 10 mg by mouth every evening.      Psyllium (METAMUCIL FIBER PO) Take 1 Dose by mouth 3 (three) times a week.      ramipril (ALTACE) 10 MG  capsule Take 10 mg by mouth every morning.      sulfamethoxazole-trimethoprim (BACTRIM DS,SEPTRA DS) 800-160 MG tablet Take 1 tablet by mouth 2 (two) times daily. 14 tablet 0   triamterene-hydrochlorothiazide (MAXZIDE-25) 37.5-25 MG tablet Take 0.5 tablets by mouth every morning.      No current facility-administered medications for this visit.     SURGICAL HISTORY:  Past Surgical History:  Procedure Laterality Date   ANAL FISSURECTOMY     COLONOSCOPY     CORONARY ARTERY BYPASS GRAFT     2000   EYE SURGERY Left Nov. 2013   Cataract   EYE SURGERY Right Feb. 2014   Cataract   HERNIA REPAIR     PORTACATH PLACEMENT Left 09/24/2018   Procedure: INSERTION PORT-A-CATH;  Surgeon: Melrose Nakayama, MD;  Location: [facility] OR;  Service: Thoracic;  Laterality: Left;   PR VEIN BYPASS GRAFT,AORTO-FEM-POP  2000   PROSTATE SURGERY  Nov. 2012   ROBOT ASSISTED LAPAROSCOPIC RADICAL PROSTATECTOMY  08/08/2011   Procedure: ROBOTIC ASSISTED LAPAROSCOPIC RADICAL PROSTATECTOMY LEVEL 2;  Surgeon: Dutch Gray, MD;  Location: [facility] ORS;  Service: Urology;  Laterality: Bilateral;  with Bilateral Pelvic Lymphadenectomy    SHOULDER SURGERY  1980'S   VIDEO BRONCHOSCOPY WITH ENDOBRONCHIAL ULTRASOUND N/A 08/06/2018   Procedure: VIDEO BRONCHOSCOPY WITH ENDOBRONCHIAL ULTRASOUND;  Surgeon: Melrose Nakayama, MD;  Location: [facility];  Service: Thoracic;  Laterality: N/A;   WISDOM TOOTH EXTRACTION      REVIEW OF SYSTEMS:  Constitutional: positive for fatigue Eyes: negative Ears, nose, mouth, throat, and face: negative Respiratory: positive for dyspnea on exertion Cardiovascular: negative Gastrointestinal: negative Genitourinary:negative Integument/breast: negative Hematologic/lymphatic: negative Musculoskeletal:negative Neurological: negative Behavioral/Psych: negative Endocrine: negative Allergic/Immunologic: negative   PHYSICAL EXAMINATION: General appearance: alert, cooperative, fatigued and no  distress Head: Normocephalic, without obvious abnormality, atraumatic Neck: no adenopathy, no JVD, supple, symmetrical, trachea midline and thyroid not enlarged, symmetric, no tenderness/mass/nodules Lymph nodes: Cervical, supraclavicular, and axillary nodes normal. Resp: clear to auscultation bilaterally Back: symmetric, no curvature. ROM normal. No CVA tenderness. Cardio: regular rate and rhythm, S1, S2 normal, no murmur, click, rub or gallop GI: soft, non-tender; bowel sounds normal; no masses,  no organomegaly Extremities: extremities normal, atraumatic, no cyanosis or edema Neurologic: Alert and oriented X 3, normal strength and tone. Normal symmetric reflexes. Normal coordination and gait  ECOG PERFORMANCE STATUS: 1 - Symptomatic but completely ambulatory  Blood pressure (!) 124/59, pulse 64, temperature 97.8 F (36.6 C), temperature source Oral, resp. rate 18, height 5\' 9"  (1.753 m), weight 162 lb 12.8 oz (73.8 kg), SpO2 100 %.  LABORATORY DATA: Lab Results  Component Value Date   WBC 4.9 02/02/2019  HGB 8.2 (L) 02/02/2019   HCT 26.6 (L) 02/02/2019   MCV 109.9 (H) 02/02/2019   PLT 187 02/02/2019      Chemistry      Component Value Date/Time   NA 138 01/12/2019 1101   NA 139 08/10/2012 1328   K 3.9 01/12/2019 1101   K 4.1 08/10/2012 1328   CL 104 01/12/2019 1101   CL 105 08/10/2012 1328   CO2 24 01/12/2019 1101   CO2 29 08/10/2012 1328   BUN 14 01/12/2019 1101   BUN 18.0 08/10/2012 1328   CREATININE 0.89 01/12/2019 1101   CREATININE 1.1 08/10/2012 1328      Component Value Date/Time   CALCIUM 8.9 01/12/2019 1101   CALCIUM 9.0 08/10/2012 1328   ALKPHOS 98 01/12/2019 1101   ALKPHOS 144 08/10/2012 1328   AST 43 (H) 01/12/2019 1101   AST 42 (H) 08/10/2012 1328   ALT 52 (H) 01/12/2019 1101   ALT 55 08/10/2012 1328   BILITOT 0.3 01/12/2019 1101   BILITOT 0.91 08/10/2012 1328       RADIOGRAPHIC STUDIES: Ct Chest W Contrast  Result Date: 01/29/2019 CLINICAL  DATA:  Follow-up stage IV right lower lobe lung adenocarcinoma. Currently undergoing chemotherapy, immunotherapy, and radiation therapy. EXAM: CT CHEST AND ABDOMEN WITH CONTRAST TECHNIQUE: Multidetector CT imaging of the chest and abdomen was performed following the standard protocol during bolus administration of intravenous contrast. CONTRAST:  147mL OMNIPAQUE IOHEXOL 300 MG/ML  SOLN COMPARISON:  11/30/2018 FINDINGS: CT CHEST FINDINGS Cardiovascular: No acute findings. Mediastinum/Lymph Nodes: No masses or pathologically enlarged lymph nodes identified. Lungs/Pleura: Central right lower lobe mass which is contiguous with the mediastinum measures 3.4 x 2.9 cm on image 81/4, without significant change in size since previous study. No new or enlarging pulmonary nodules or masses identified chronic interstitial disease is again seen in the lung bases, however there is no evidence of acute infiltrate or pleural effusion. Musculoskeletal:  No suspicious bone lesions identified. CT ABDOMEN FINDINGS Hepatobiliary: A 1.4 cm low-attenuation lesion in segment 2 of the left hepatic lobe was not seen on previous study and is suspicious for a liver metastasis. Other sub-cm low-attenuation lesions in the right lobe are too small to characterize but remains stable. Gallbladder is unremarkable. No evidence of biliary ductal dilatation. Pancreas:  No mass or inflammatory changes. Spleen:  Within normal limits in size and appearance. Adrenals/Urinary tract: No adrenal or renal masses identified. Several bilateral renal cysts remain stable. No evidence of hydronephrosis. Stomach/Bowel: No evidence of obstruction, inflammatory process, or abnormal fluid collections. Vascular/Lymphatic: No pathologically enlarged lymph nodes identified. 3.6 cm infrarenal abdominal aortic aneurysm remains stable, as well as chronic occlusion of the left common iliac artery. Other:  None. Musculoskeletal: Mixed lytic and sclerotic lesions involving the  L1 vertebral body and right scapula are stable. No new or progressive bone lesions identified. IMPRESSION: 1. Stable central right lower lobe mass. No new or progressive disease within the thorax. 2. New 1.4 cm low-attenuation lesion in left hepatic lobe, suspicious for liver metastasis. Other indeterminate sub-cm lesions in the right hepatic lobe remain stable. 3. 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 4. 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 Recommend followup by ultrasound in 2 years. This recommendation follows ACR consensus guidelines: White Paper of the ACR Incidental Findings Committee II on Vascular Findings. J Am Coll Radiol 2013; 10:789-794. Electronically Signed   By: Earle Gell M.D.   On: 01/29/2019 16:33  Ct Abdomen W Contrast  Result Date: 01/29/2019 CLINICAL DATA:  Follow-up stage IV right lower lobe lung adenocarcinoma. Currently undergoing chemotherapy, immunotherapy, and radiation therapy. EXAM: CT CHEST AND ABDOMEN WITH CONTRAST TECHNIQUE: Multidetector CT imaging of the chest and abdomen was performed following the standard protocol during bolus administration of intravenous contrast. CONTRAST:  168mL OMNIPAQUE IOHEXOL 300 MG/ML  SOLN COMPARISON:  11/30/2018 FINDINGS: CT CHEST FINDINGS Cardiovascular: No acute findings. Mediastinum/Lymph Nodes: No masses or pathologically enlarged lymph nodes identified. Lungs/Pleura: Central right lower lobe mass which is contiguous with the mediastinum measures 3.4 x 2.9 cm on image 81/4, without significant change in size since previous study. No new or enlarging pulmonary nodules or masses identified chronic interstitial disease is again seen in the lung bases, however there is no evidence of acute infiltrate or pleural effusion. Musculoskeletal:  No suspicious bone lesions identified. CT ABDOMEN FINDINGS Hepatobiliary: A 1.4 cm low-attenuation lesion in segment 2 of the left  hepatic lobe was not seen on previous study and is suspicious for a liver metastasis. Other sub-cm low-attenuation lesions in the right lobe are too small to characterize but remains stable. Gallbladder is unremarkable. No evidence of biliary ductal dilatation. Pancreas:  No mass or inflammatory changes. Spleen:  Within normal limits in size and appearance. Adrenals/Urinary tract: No adrenal or renal masses identified. Several bilateral renal cysts remain stable. No evidence of hydronephrosis. Stomach/Bowel: No evidence of obstruction, inflammatory process, or abnormal fluid collections. Vascular/Lymphatic: No pathologically enlarged lymph nodes identified. 3.6 cm infrarenal abdominal aortic aneurysm remains stable, as well as chronic occlusion of the left common iliac artery. Other:  None. Musculoskeletal: Mixed lytic and sclerotic lesions involving the L1 vertebral body and right scapula are stable. No new or progressive bone lesions identified. IMPRESSION: 1. Stable central right lower lobe mass. No new or progressive disease within the thorax. 2. New 1.4 cm low-attenuation lesion in left hepatic lobe, suspicious for liver metastasis. Other indeterminate sub-cm lesions in the right hepatic lobe remain stable. 3. 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 4. 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 Recommend followup by ultrasound in 2 years. This recommendation follows ACR consensus guidelines: White Paper of the ACR Incidental Findings Committee II on Vascular Findings. J Am Coll Radiol 2013; 10:789-794. Electronically Signed   By: Earle Gell M.D.   On: 01/29/2019 16:33    ASSESSMENT AND PLAN: This is a very pleasant 78 years old white male with a stage IV non-small cell lung cancer, adenocarcinoma presented with large right lower lobe lung mass in addition to mediastinal lymphadenopathy, metastatic disease to the adrenal glands as well as  several metastatic bone lesions including the right shoulder, lumbar spine and pelvic bones.  The patient has no actionable mutations. He underwent palliative radiotherapy to the right shoulder and painful metastatic bone lesions. He is feeling much better after the radiotherapy. He is currently undergoing systemic chemotherapy tomorrow with carboplatin for AUC of 5, Alimta 500 mg/M2 and Keytruda 200 mg IV every 3 weeks.  Status post 4 cycles. He is currently on maintenance treatment with Alimta and Keytruda status post 2 cycles.  He has been tolerating his treatment well except for fatigue secondary to chemotherapy-induced anemia. The patient had repeat CT scan of the chest, abdomen pelvis performed recently.  I personally and independently reviewed the scan images and discussed the result and showed the images to the patient.  His scan showed no concerning findings for  disease progression except for new 1.4 cm low-attenuation lesion in the left hepatic lobe suspicious for metastasis. I had a lengthy discussion with the patient about his current condition and treatment options.  I discussed with the patient the option of continuing treatment with 2 more cycles with the same regimen and repeating CT scan again for further evaluation of the suspicious liver lesion versus discontinuing his current treatment and switching to second line treatment with docetaxel and Cyramza which I felt to be rough on the patient with his current condition versus palliative care and hospice referral. After discussion of all the options the patient would like to proceed with 2 more cycles of the same regimen for now.  I will proceed with cycle #7 today. 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 For the chemotherapy-induced anemia, we will continue to monitor him closely and consider the patient for transfusion if needed. The patient was advised to call  immediately if he has any concerning symptoms in the interval. The patient voices understanding of current disease status and treatment options and is in agreement with the current care plan. All questions were answered. The patient knows to call the clinic with any problems, questions or concerns. We can certainly see the patient much sooner if necessary.   Disclaimer: This note was dictated with voice recognition software. Similar sounding words can inadvertently be transcribed and may not be corrected upon review.

## 2019-02-02 NOTE — Patient Instructions (Signed)
[facility] Discharge Instructions for Patients Receiving Chemotherapy  Today you received the following chemotherapy agents Keytruda and Alimta To help prevent nausea and vomiting after your treatment, we encourage you to take your nausea medication as directed   If you develop nausea and vomiting that is not controlled by your nausea medication, call the clinic.   BELOW ARE SYMPTOMS THAT SHOULD BE REPORTED IMMEDIATELY:  *FEVER GREATER THAN 100.5 F  *CHILLS WITH OR WITHOUT FEVER  NAUSEA AND VOMITING THAT IS NOT CONTROLLED WITH YOUR NAUSEA MEDICATION  *UNUSUAL SHORTNESS OF BREATH  *UNUSUAL BRUISING OR BLEEDING  TENDERNESS IN MOUTH AND THROAT WITH OR WITHOUT PRESENCE OF ULCERS  *URINARY PROBLEMS  *BOWEL PROBLEMS  UNUSUAL RASH Items with * indicate a potential emergency and should be followed up as soon as possible.  Feel free to call the clinic should you have any questions or concerns. The clinic phone number is [phone].  Please show the [facility] at check-in to the Emergency Department and triage nurse.

## 2019-02-10 ENCOUNTER — Telehealth: Payer: Self-pay | Admitting: Internal Medicine

## 2019-02-10 NOTE — Telephone Encounter (Signed)
Faxed medical records to [facility]. Release YO#[id number]

## 2019-02-12 ENCOUNTER — Other Ambulatory Visit: Payer: Self-pay | Admitting: Medical

## 2019-02-12 NOTE — Telephone Encounter (Signed)
Dr Julien Nordmann,  [name] would like 90-day supply.  Please advise.

## 2019-02-23 ENCOUNTER — Other Ambulatory Visit: Payer: Self-pay

## 2019-02-23 ENCOUNTER — Inpatient Hospital Stay: Payer: Medicare Other

## 2019-02-23 ENCOUNTER — Inpatient Hospital Stay: Payer: Medicare Other | Attending: Oncology

## 2019-02-23 ENCOUNTER — Encounter: Payer: Self-pay | Admitting: Internal Medicine

## 2019-02-23 ENCOUNTER — Inpatient Hospital Stay (HOSPITAL_BASED_OUTPATIENT_CLINIC_OR_DEPARTMENT_OTHER): Payer: Medicare Other | Admitting: Internal Medicine

## 2019-02-23 VITALS — BP 130/51 | HR 57 | Temp 98.3°F | Resp 18 | Ht 69.0 in | Wt 165.3 lb

## 2019-02-23 DIAGNOSIS — C7951 Secondary malignant neoplasm of bone: Secondary | ICD-10-CM | POA: Insufficient documentation

## 2019-02-23 DIAGNOSIS — Z5111 Encounter for antineoplastic chemotherapy: Secondary | ICD-10-CM | POA: Diagnosis not present

## 2019-02-23 DIAGNOSIS — G629 Polyneuropathy, unspecified: Secondary | ICD-10-CM | POA: Insufficient documentation

## 2019-02-23 DIAGNOSIS — Z95828 Presence of other vascular implants and grafts: Secondary | ICD-10-CM

## 2019-02-23 DIAGNOSIS — C771 Secondary and unspecified malignant neoplasm of intrathoracic lymph nodes: Secondary | ICD-10-CM

## 2019-02-23 DIAGNOSIS — Z79899 Other long term (current) drug therapy: Secondary | ICD-10-CM | POA: Diagnosis not present

## 2019-02-23 DIAGNOSIS — D6481 Anemia due to antineoplastic chemotherapy: Secondary | ICD-10-CM

## 2019-02-23 DIAGNOSIS — C3491 Malignant neoplasm of unspecified part of right bronchus or lung: Secondary | ICD-10-CM

## 2019-02-23 DIAGNOSIS — Z5112 Encounter for antineoplastic immunotherapy: Secondary | ICD-10-CM | POA: Diagnosis not present

## 2019-02-23 DIAGNOSIS — C3431 Malignant neoplasm of lower lobe, right bronchus or lung: Secondary | ICD-10-CM | POA: Insufficient documentation

## 2019-02-23 DIAGNOSIS — Z923 Personal history of irradiation: Secondary | ICD-10-CM

## 2019-02-23 DIAGNOSIS — C7970 Secondary malignant neoplasm of unspecified adrenal gland: Secondary | ICD-10-CM | POA: Insufficient documentation

## 2019-02-23 DIAGNOSIS — C3490 Malignant neoplasm of unspecified part of unspecified bronchus or lung: Secondary | ICD-10-CM

## 2019-02-23 DIAGNOSIS — C7931 Secondary malignant neoplasm of brain: Secondary | ICD-10-CM

## 2019-02-23 LAB — CMP (CANCER CENTER ONLY)
ALT: 47 U/L — ABNORMAL HIGH (ref 0–44)
AST: 39 U/L (ref 15–41)
Albumin: 3 g/dL — ABNORMAL LOW (ref 3.5–5.0)
Alkaline Phosphatase: 102 U/L (ref 38–126)
Anion gap: 10 (ref 5–15)
BUN: 14 mg/dL (ref 8–23)
CO2: 25 mmol/L (ref 22–32)
Calcium: 9 mg/dL (ref 8.9–10.3)
Chloride: 102 mmol/L (ref 98–111)
Creatinine: 0.93 mg/dL (ref 0.61–1.24)
GFR, Est AFR Am: >60 mL/min (ref >60)
GFR, Estimated: >60 mL/min (ref >60)
Glucose, Bld: 134 mg/dL — ABNORMAL HIGH (ref 70–99)
Potassium: 4.2 mmol/L (ref 3.5–5.1)
Sodium: 137 mmol/L (ref 135–145)
Total Bilirubin: 0.3 mg/dL (ref 0.3–1.2)
Total Protein: 6.9 g/dL (ref 6.5–8.1)

## 2019-02-23 LAB — CBC WITH DIFFERENTIAL (CANCER CENTER ONLY)
Abs Immature Granulocytes: 0.02 10*3/uL (ref 0.00–0.07)
Basophils Absolute: 0 10*3/uL (ref 0.0–0.1)
Basophils Relative: 0 %
Eosinophils Absolute: 0.1 10*3/uL (ref 0.0–0.5)
Eosinophils Relative: 2 %
HCT: 26.3 % — ABNORMAL LOW (ref 39.0–52.0)
Hemoglobin: 8.1 g/dL — ABNORMAL LOW (ref 13.0–17.0)
Immature Granulocytes: 0 %
Lymphocytes Relative: 21 %
Lymphs Abs: 1 10*3/uL (ref 0.7–4.0)
MCH: 35.2 pg — ABNORMAL HIGH (ref 26.0–34.0)
MCHC: 30.8 g/dL (ref 30.0–36.0)
MCV: 114.3 fL — ABNORMAL HIGH (ref 80.0–100.0)
Monocytes Absolute: 0.7 10*3/uL (ref 0.1–1.0)
Monocytes Relative: 14 %
Neutro Abs: 3.1 10*3/uL (ref 1.7–7.7)
Neutrophils Relative %: 63 %
Platelet Count: 172 10*3/uL (ref 150–400)
RBC: 2.3 MIL/uL — ABNORMAL LOW (ref 4.22–5.81)
RDW: 19.1 % — ABNORMAL HIGH (ref 11.5–15.5)
WBC Count: 4.8 10*3/uL (ref 4.0–10.5)
nRBC: 0 % (ref 0.0–0.2)

## 2019-02-23 LAB — TSH: TSH: 0.926 u[IU]/mL (ref 0.320–4.118)

## 2019-02-23 MED ORDER — SODIUM CHLORIDE 0.9 % IV SOLN
200.0000 mg | Freq: Once | INTRAVENOUS | Status: AC
  Administered 2019-02-23: 200 mg via INTRAVENOUS
  Filled 2019-02-23: qty 8

## 2019-02-23 MED ORDER — SODIUM CHLORIDE 0.9% FLUSH
10.0000 mL | INTRAVENOUS | Status: DC | PRN
  Administered 2019-02-23: 10 mL
  Filled 2019-02-23: qty 10

## 2019-02-23 MED ORDER — HEPARIN SOD (PORK) LOCK FLUSH 100 UNIT/ML IV SOLN
500.0000 [IU] | Freq: Once | INTRAVENOUS | Status: AC | PRN
  Administered 2019-02-23: 500 [IU]
  Filled 2019-02-23: qty 5

## 2019-02-23 MED ORDER — SODIUM CHLORIDE 0.9 % IV SOLN
Freq: Once | INTRAVENOUS | Status: AC
  Administered 2019-02-23: 13:00:00 via INTRAVENOUS
  Filled 2019-02-23: qty 250

## 2019-02-23 MED ORDER — SODIUM CHLORIDE 0.9% FLUSH
10.0000 mL | Freq: Once | INTRAVENOUS | Status: AC
  Administered 2019-02-23: 10 mL
  Filled 2019-02-23: qty 10

## 2019-02-23 MED ORDER — SODIUM CHLORIDE 0.9 % IV SOLN
800.0000 mg | Freq: Once | INTRAVENOUS | Status: AC
  Administered 2019-02-23: 800 mg via INTRAVENOUS
  Filled 2019-02-23: qty 20

## 2019-02-23 MED ORDER — PROCHLORPERAZINE MALEATE 10 MG PO TABS
10.0000 mg | ORAL_TABLET | Freq: Once | ORAL | Status: AC
  Administered 2019-02-23: 10 mg via ORAL

## 2019-02-23 MED ORDER — PROCHLORPERAZINE MALEATE 10 MG PO TABS
ORAL_TABLET | ORAL | Status: AC
  Filled 2019-02-23: qty 1

## 2019-02-23 NOTE — Progress Notes (Signed)
[facility] [phone]   [phone]  OFFICE PROGRESS NOTE  [name], MD [place] [address]  DIAGNOSIS:Stage IV non-small cell lung cancer, adenocarcinoma who presented with a large right lower lobe lung mass in addition to metastatic disease to the mediastinal lymph nodes, adrenal glands, and bone involving the right shoulder,lumbar spines, and pelvic bone.  Molecular Studies by [employer]: Showed no actionable mutations.  PRIOR THERAPY:1) palliative radiation to the right neck and shoulder.  He is scheduled to have [place] to the brain lesions on 09/01/2018.  CURRENT THERAPY: 1) palliative systemic chemotherapy with carboplatin for an AUC of 5, Alimta 400 mg/m, and Keytruda 200 mg IV every 3 weeks.  First dose September 30, 2018.  Status post 7 cycles.  Starting from cycle #5 the patient will be treated with maintenance Alimta and Keytruda.  INTERVAL HISTORY: Tony [name] 78 y.o. male returns to the clinic today for follow-up visit.  The patient is feeling fine today with no concerning complaints except for mild fatigue.  He tolerated the last cycle of his treatment well with no concerning adverse effects.  He denied having any chest pain, shortness of breath, cough or hemoptysis.  He denied having any fever or chills.  He has no nausea, vomiting, diarrhea or constipation.  He continues to tolerate his treatment well.  He is here today for evaluation before starting cycle number is 8.  MEDICAL HISTORY: Past Medical History:  Diagnosis Date   Anemia    Brain metastases (HCC)    CAD (coronary artery disease)    Constipation    Gout    Heart disease    History of blood transfusion    History of radiation therapy 09/01/2018   brain, cerebella (5 sites)/ 20 Gy in 1 fraction.   HLD (hyperlipidemia) 10/13/2018   HTN (hypertension) 10/13/2018   Hyperlipidemia    Hypertension    Lung cancer ([place])      Mass of lower lobe of right lung    Myocardial infarction [facility])    MI in 2000   Peripheral vascular disease ([place])    Abdominal Aortic Aneurysm   Prostate cancer ([place]) Nov. 2012   Ulcer     ALLERGIES:  has No Known Allergies.  MEDICATIONS:  Current Outpatient Medications  Medication Sig Dispense Refill   acetaminophen (TYLENOL) 650 MG CR tablet Take 650 mg by mouth daily as needed for pain.      aspirin 81 MG tablet Take 81 mg by mouth every evening.      colchicine 0.6 MG tablet Take 0.6 mg by mouth daily.     folic acid (FOLVITE) 921 MCG tablet Take 800 mcg by mouth daily.      gabapentin (NEURONTIN) 100 MG capsule TAKE 2 CAPSULES BY MOUTH AT BEDTIME 180 capsule 1   metoprolol tartrate (LOPRESSOR) 50 MG tablet Take 50 mg by mouth 2 (two) times daily.     Multiple Vitamins-Minerals (MULTIVITAMIN WITH MINERALS) tablet Take 1 tablet by mouth daily.     mupirocin ointment (BACTROBAN) 2 % Place 1 application into the nose 3 (three) times daily. Place over area 3 times daily (Patient not taking: Reported on 12/01/2018) 30 g 0   niacin 500 MG tablet Take 500 mg by mouth 2 (two) times daily.     Omega-3 Fatty Acids (FISH OIL) 1200 MG CAPS Take 1,200 mg by mouth daily.      omeprazole (PRILOSEC OTC) 20  MG tablet Take 10 mg by mouth 2 (two) times a week.     polyethylene glycol (MIRALAX / GLYCOLAX) packet Take 17 g by mouth 4 (four) times a week.      pravastatin (PRAVACHOL) 10 MG tablet Take 10 mg by mouth every evening.      Psyllium (METAMUCIL FIBER PO) Take 1 Dose by mouth 3 (three) times a week.      ramipril (ALTACE) 10 MG capsule Take 10 mg by mouth every morning.      triamterene-hydrochlorothiazide (MAXZIDE-25) 37.5-25 MG tablet Take 0.5 tablets by mouth every morning.      No current facility-administered medications for this visit.     SURGICAL HISTORY:  Past Surgical History:  Procedure Laterality Date   ANAL FISSURECTOMY     COLONOSCOPY      CORONARY ARTERY BYPASS GRAFT     2000   EYE SURGERY Left Nov. 2013   Cataract   EYE SURGERY Right Feb. 2014   Cataract   HERNIA REPAIR     PORTACATH PLACEMENT Left 09/24/2018   Procedure: INSERTION PORT-A-CATH;  Surgeon: Melrose Nakayama, MD;  Location: [facility] OR;  Service: Thoracic;  Laterality: Left;   PR VEIN BYPASS GRAFT,AORTO-FEM-POP  2000   PROSTATE SURGERY  Nov. 2012   ROBOT ASSISTED LAPAROSCOPIC RADICAL PROSTATECTOMY  08/08/2011   Procedure: ROBOTIC ASSISTED LAPAROSCOPIC RADICAL PROSTATECTOMY LEVEL 2;  Surgeon: Dutch Gray, MD;  Location: [facility] ORS;  Service: Urology;  Laterality: Bilateral;  with Bilateral Pelvic Lymphadenectomy    SHOULDER SURGERY  1980'S   VIDEO BRONCHOSCOPY WITH ENDOBRONCHIAL ULTRASOUND N/A 08/06/2018   Procedure: VIDEO BRONCHOSCOPY WITH ENDOBRONCHIAL ULTRASOUND;  Surgeon: Melrose Nakayama, MD;  Location: [facility];  Service: Thoracic;  Laterality: N/A;   WISDOM TOOTH EXTRACTION      REVIEW OF SYSTEMS:  A comprehensive review of systems was negative except for: Constitutional: positive for fatigue   PHYSICAL EXAMINATION: General appearance: alert, cooperative, fatigued and no distress Head: Normocephalic, without obvious abnormality, atraumatic Neck: no adenopathy, no JVD, supple, symmetrical, trachea midline and thyroid not enlarged, symmetric, no tenderness/mass/nodules Lymph nodes: Cervical, supraclavicular, and axillary nodes normal. Resp: clear to auscultation bilaterally Back: symmetric, no curvature. ROM normal. No CVA tenderness. Cardio: regular rate and rhythm, S1, S2 normal, no murmur, click, rub or gallop GI: soft, non-tender; bowel sounds normal; no masses,  no organomegaly Extremities: extremities normal, atraumatic, no cyanosis or edema  ECOG PERFORMANCE STATUS: 1 - Symptomatic but completely ambulatory  Blood pressure (!) 130/51, pulse (!) 57, temperature 98.3 F (36.8 C), temperature source Oral, resp. rate 18, height 5\' 9"  (1.753  m), weight 165 lb 4.8 oz (75 kg), SpO2 99 %.  LABORATORY DATA: Lab Results  Component Value Date   WBC 4.8 02/23/2019   HGB 8.1 (L) 02/23/2019   HCT 26.3 (L) 02/23/2019   MCV 114.3 (H) 02/23/2019   PLT 172 02/23/2019      Chemistry      Component Value Date/Time   NA 140 02/02/2019 0840   NA 139 08/10/2012 1328   K 4.0 02/02/2019 0840   K 4.1 08/10/2012 1328   CL 104 02/02/2019 0840   CL 105 08/10/2012 1328   CO2 24 02/02/2019 0840   CO2 29 08/10/2012 1328   BUN 15 02/02/2019 0840   BUN 18.0 08/10/2012 1328   CREATININE 0.97 02/02/2019 0840   CREATININE 1.1 08/10/2012 1328      Component Value Date/Time   CALCIUM 9.3 02/02/2019 0840   CALCIUM 9.0  08/10/2012 1328   ALKPHOS 101 02/02/2019 0840   ALKPHOS 144 08/10/2012 1328   AST 44 (H) 02/02/2019 0840   AST 42 (H) 08/10/2012 1328   ALT 60 (H) 02/02/2019 0840   ALT 55 08/10/2012 1328   BILITOT 0.3 02/02/2019 0840   BILITOT 0.91 08/10/2012 1328       RADIOGRAPHIC STUDIES: Ct Chest W Contrast  Result Date: 01/29/2019 CLINICAL DATA:  Follow-up stage IV right lower lobe lung adenocarcinoma. Currently undergoing chemotherapy, immunotherapy, and radiation therapy. EXAM: CT CHEST AND ABDOMEN WITH CONTRAST TECHNIQUE: Multidetector CT imaging of the chest and abdomen was performed following the standard protocol during bolus administration of intravenous contrast. CONTRAST:  134mL OMNIPAQUE IOHEXOL 300 MG/ML  SOLN COMPARISON:  11/30/2018 FINDINGS: CT CHEST FINDINGS Cardiovascular: No acute findings. Mediastinum/Lymph Nodes: No masses or pathologically enlarged lymph nodes identified. Lungs/Pleura: Central right lower lobe mass which is contiguous with the mediastinum measures 3.4 x 2.9 cm on image 81/4, without significant change in size since previous study. No new or enlarging pulmonary nodules or masses identified chronic interstitial disease is again seen in the lung bases, however there is no evidence of acute infiltrate or  pleural effusion. Musculoskeletal:  No suspicious bone lesions identified. CT ABDOMEN FINDINGS Hepatobiliary: A 1.4 cm low-attenuation lesion in segment 2 of the left hepatic lobe was not seen on previous study and is suspicious for a liver metastasis. Other sub-cm low-attenuation lesions in the right lobe are too small to characterize but remains stable. Gallbladder is unremarkable. No evidence of biliary ductal dilatation. Pancreas:  No mass or inflammatory changes. Spleen:  Within normal limits in size and appearance. Adrenals/Urinary tract: No adrenal or renal masses identified. Several bilateral renal cysts remain stable. No evidence of hydronephrosis. Stomach/Bowel: No evidence of obstruction, inflammatory process, or abnormal fluid collections. Vascular/Lymphatic: No pathologically enlarged lymph nodes identified. 3.6 cm infrarenal abdominal aortic aneurysm remains stable, as well as chronic occlusion of the left common iliac artery. Other:  None. Musculoskeletal: Mixed lytic and sclerotic lesions involving the L1 vertebral body and right scapula are stable. No new or progressive bone lesions identified. IMPRESSION: 1. Stable central right lower lobe mass. No new or progressive disease within the thorax. 2. New 1.4 cm low-attenuation lesion in left hepatic lobe, suspicious for liver metastasis. Other indeterminate sub-cm lesions in the right hepatic lobe remain stable. 3. 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 4. 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 Recommend followup by ultrasound in 2 years. This recommendation follows ACR consensus guidelines: White Paper of the ACR Incidental Findings Committee II on Vascular Findings. J Am Coll Radiol 2013; 10:789-794. Electronically Signed   By: Earle Gell M.D.   On: 01/29/2019 16:33   Ct Abdomen W Contrast  Result Date: 01/29/2019 CLINICAL DATA:  Follow-up stage IV right lower lobe lung  adenocarcinoma. Currently undergoing chemotherapy, immunotherapy, and radiation therapy. EXAM: CT CHEST AND ABDOMEN WITH CONTRAST TECHNIQUE: Multidetector CT imaging of the chest and abdomen was performed following the standard protocol during bolus administration of intravenous contrast. CONTRAST:  190mL OMNIPAQUE IOHEXOL 300 MG/ML  SOLN COMPARISON:  11/30/2018 FINDINGS: CT CHEST FINDINGS Cardiovascular: No acute findings. Mediastinum/Lymph Nodes: No masses or pathologically enlarged lymph nodes identified. Lungs/Pleura: Central right lower lobe mass which is contiguous with the mediastinum measures 3.4 x 2.9 cm on image 81/4, without significant change in size since previous study. No new or enlarging pulmonary nodules or masses identified chronic interstitial disease  is again seen in the lung bases, however there is no evidence of acute infiltrate or pleural effusion. Musculoskeletal:  No suspicious bone lesions identified. CT ABDOMEN FINDINGS Hepatobiliary: A 1.4 cm low-attenuation lesion in segment 2 of the left hepatic lobe was not seen on previous study and is suspicious for a liver metastasis. Other sub-cm low-attenuation lesions in the right lobe are too small to characterize but remains stable. Gallbladder is unremarkable. No evidence of biliary ductal dilatation. Pancreas:  No mass or inflammatory changes. Spleen:  Within normal limits in size and appearance. Adrenals/Urinary tract: No adrenal or renal masses identified. Several bilateral renal cysts remain stable. No evidence of hydronephrosis. Stomach/Bowel: No evidence of obstruction, inflammatory process, or abnormal fluid collections. Vascular/Lymphatic: No pathologically enlarged lymph nodes identified. 3.6 cm infrarenal abdominal aortic aneurysm remains stable, as well as chronic occlusion of the left common iliac artery. Other:  None. Musculoskeletal: Mixed lytic and sclerotic lesions involving the L1 vertebral body and right scapula are stable.  No new or progressive bone lesions identified. IMPRESSION: 1. Stable central right lower lobe mass. No new or progressive disease within the thorax. 2. New 1.4 cm low-attenuation lesion in left hepatic lobe, suspicious for liver metastasis. Other indeterminate sub-cm lesions in the right hepatic lobe remain stable. 3. 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 4. 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 Recommend followup by ultrasound in 2 years. This recommendation follows ACR consensus guidelines: White Paper of the ACR Incidental Findings Committee II on Vascular Findings. J Am Coll Radiol 2013; 10:789-794. Electronically Signed   By: Earle Gell M.D.   On: 01/29/2019 16:33    ASSESSMENT AND PLAN: This is a very pleasant 78 years old white male with a stage IV non-small cell lung cancer, adenocarcinoma presented with large right lower lobe lung mass in addition to mediastinal lymphadenopathy, metastatic disease to the adrenal glands as well as several metastatic bone lesions including the right shoulder, lumbar spine and pelvic bones.  The patient has no actionable mutations. He underwent palliative radiotherapy to the right shoulder and painful metastatic bone lesions. He is feeling much better after the radiotherapy. He is currently undergoing systemic chemotherapy tomorrow with carboplatin for AUC of 5, Alimta 500 mg/M2 and Keytruda 200 mg IV every 3 weeks.  Status post 4 cycles. He is currently on maintenance treatment with Alimta and Keytruda status post 3 cycles.   The patient continued to practice treatment well with no concerning adverse effect except for the next week. I recommended for him to proceed with cycle #4 of his current chemotherapy as planned. I will see him back for follow-up visit in 3 weeks for evaluation with repeat CT scan of the chest, abdomen and pelvis for restaging.. 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 For the chemotherapy-induced anemia, we will continue to monitor him closely and consider the patient for transfusion if needed. The patient was advised to call immediately if he has any concerning symptoms in the interval. The patient voices understanding of current disease status and treatment options and is in agreement with the current care plan. All questions were answered. The patient knows to call the clinic with any problems, questions or concerns. We can certainly see the patient much sooner if necessary.   Disclaimer: This note was dictated with voice recognition software. Similar sounding words can inadvertently be transcribed and may  not be corrected upon review.

## 2019-02-23 NOTE — Patient Instructions (Signed)
[facility] Discharge Instructions for Patients Receiving Chemotherapy  Today you received the following chemotherapy agents Keytruda and Alimta To help prevent nausea and vomiting after your treatment, we encourage you to take your nausea medication as directed   If you develop nausea and vomiting that is not controlled by your nausea medication, call the clinic.   BELOW ARE SYMPTOMS THAT SHOULD BE REPORTED IMMEDIATELY:  *FEVER GREATER THAN 100.5 F  *CHILLS WITH OR WITHOUT FEVER  NAUSEA AND VOMITING THAT IS NOT CONTROLLED WITH YOUR NAUSEA MEDICATION  *UNUSUAL SHORTNESS OF BREATH  *UNUSUAL BRUISING OR BLEEDING  TENDERNESS IN MOUTH AND THROAT WITH OR WITHOUT PRESENCE OF ULCERS  *URINARY PROBLEMS  *BOWEL PROBLEMS  UNUSUAL RASH Items with * indicate a potential emergency and should be followed up as soon as possible.  Feel free to call the clinic should you have any questions or concerns. The clinic phone number is [phone].  Please show the [facility] at check-in to the Emergency Department and triage nurse.

## 2019-03-01 ENCOUNTER — Other Ambulatory Visit: Payer: Self-pay | Admitting: Radiation Therapy

## 2019-03-03 ENCOUNTER — Other Ambulatory Visit: Payer: Medicare Other

## 2019-03-04 ENCOUNTER — Ambulatory Visit (HOSPITAL_COMMUNITY)
Admission: RE | Admit: 2019-03-04 | Discharge: 2019-03-04 | Disposition: A | Discharge status: Home / Self Care | Payer: Medicare Other | Source: Ambulatory Visit | Attending: Radiation Oncology | Admitting: Radiation Oncology

## 2019-03-04 ENCOUNTER — Other Ambulatory Visit: Payer: Self-pay

## 2019-03-04 DIAGNOSIS — C7949 Secondary malignant neoplasm of other parts of nervous system: Secondary | ICD-10-CM | POA: Insufficient documentation

## 2019-03-04 DIAGNOSIS — C7931 Secondary malignant neoplasm of brain: Secondary | ICD-10-CM | POA: Insufficient documentation

## 2019-03-04 MED ORDER — GADOBUTROL 1 MMOL/ML IV SOLN
7.5000 mL | Freq: Once | INTRAVENOUS | Status: AC | PRN
  Administered 2019-03-04: 7.5 mL via INTRAVENOUS

## 2019-03-08 ENCOUNTER — Telehealth: Payer: Self-pay | Admitting: Radiation Oncology

## 2019-03-08 NOTE — Telephone Encounter (Signed)
New message:     LVM for patient to call back to schedule webex/telephone encounter

## 2019-03-09 ENCOUNTER — Telehealth: Payer: Self-pay | Admitting: Radiation Oncology

## 2019-03-09 NOTE — Telephone Encounter (Signed)
Confirmed appt and verified info. °

## 2019-03-10 ENCOUNTER — Ambulatory Visit
Admission: RE | Admit: 2019-03-10 | Discharge: 2019-03-10 | Disposition: A | Discharge status: Home / Self Care | Payer: Medicare Other | Source: Ambulatory Visit | Attending: Radiation Oncology | Admitting: Radiation Oncology

## 2019-03-10 DIAGNOSIS — C7931 Secondary malignant neoplasm of brain: Secondary | ICD-10-CM

## 2019-03-10 DIAGNOSIS — C7951 Secondary malignant neoplasm of bone: Secondary | ICD-10-CM

## 2019-03-13 ENCOUNTER — Encounter: Payer: Self-pay | Admitting: Radiation Oncology

## 2019-03-13 NOTE — Progress Notes (Signed)
Radiation Oncology         [phone] ________________________________  Name: Tony [name] MRN: [id number]  Date: 03/10/2019  DOB: 1941/01/11  Follow-Up Visit Note by phone due to pandemic risks  Outpatient  CC: Tony Ruff, MD  Tony Ruff, MD  Diagnosis:      ICD-10-CM   1. Bone metastasis ([place])  C79.51   2. Brain metastases [facility])  C79.31    Radiation treatment date:   09/01/18  Site/dose:   Brain, cerebellar (5 lesions) / 20 Gy in 1 fraction: Tony [name] received stereotactic radiosurgery to the following targets with a total of 6 VMAT beams: PTV1 Rt Post Fossa 5mm 20 Gy PTV2 Rt  Post Fossa 43mm 20 Gy PTV3 Mid Post Fossa 51mm 20 Gy PTV4 Rt Post Fossa 29mm 20 Gy PTV5 Lt Post Fossa 53mm 20Gy   CHIEF COMPLAINT: Here for follow-up and surveillance of brain metastases  Narrative:  We spoke by phone today.   Most recent MRI of the brain has motion artifact by appears without any obvious sign of progression  Symptomatically, patient is stable neurologically     ALLERGIES:  has No Known Allergies.  Meds: Current Outpatient Medications  Medication Sig Dispense Refill  . acetaminophen (TYLENOL) 650 MG CR tablet Take 650 mg by mouth daily as needed for pain.     Marland Kitchen aspirin 81 MG tablet Take 81 mg by mouth every evening.     . colchicine 0.6 MG tablet Take 0.6 mg by mouth daily.    . folic acid (FOLVITE) 458 MCG tablet Take 800 mcg by mouth daily.     Marland Kitchen gabapentin (NEURONTIN) 100 MG capsule TAKE 2 CAPSULES BY MOUTH AT BEDTIME 180 capsule 1  . metoprolol tartrate (LOPRESSOR) 50 MG tablet Take 50 mg by mouth 2 (two) times daily.    . Multiple Vitamins-Minerals (MULTIVITAMIN WITH MINERALS) tablet Take 1 tablet by mouth daily.    . mupirocin ointment (BACTROBAN) 2 % Place 1 application into the nose 3 (three) times daily. Place over area 3 times daily (Patient not taking: Reported on 12/01/2018) 30 g 0  . niacin 500 MG tablet Take 500 mg by mouth 2 (two) times  daily.    . Omega-3 Fatty Acids (FISH OIL) 1200 MG CAPS Take 1,200 mg by mouth daily.     Marland Kitchen omeprazole (PRILOSEC OTC) 20 MG tablet Take 10 mg by mouth 2 (two) times a week.    . polyethylene glycol (MIRALAX / GLYCOLAX) packet Take 17 g by mouth 4 (four) times a week.     . pravastatin (PRAVACHOL) 10 MG tablet Take 10 mg by mouth every evening.     . Psyllium (METAMUCIL FIBER PO) Take 1 Dose by mouth 3 (three) times a week.     . ramipril (ALTACE) 10 MG capsule Take 10 mg by mouth every morning.     . triamterene-hydrochlorothiazide (MAXZIDE-25) 37.5-25 MG tablet Take 0.5 tablets by mouth every morning.      No current facility-administered medications for this encounter.     Physical Findings: NA  Lab Findings: Lab Results  Component Value Date   WBC 4.8 02/23/2019   HGB 8.1 (L) 02/23/2019   HCT 26.3 (L) 02/23/2019   MCV 114.3 (H) 02/23/2019   PLT 172 02/23/2019    @LASTCHEMISTRY @  Radiographic Findings: Mr Jeri Cos KD Contrast  Result Date: 03/04/2019 CLINICAL DATA:  Stereotactic radio surgery follow-up. Stage IV non-small cell lung cancer, adenocarcinoma. From oncology notes: PRIOR THERAPY: 1)  palliative radiation to the right neck and shoulder. He is scheduled to have [place] to the brain lesions on 09/01/2018.CURRENT THERAPY: 1) palliative systemic chemotherapy with carboplatin for an AUC of 5, Alimta 400 mg/m, and Keytruda 200 mg IV every 3 weeks. First dose September 30, 2018. Status post 7 cycles. Starting from cycle #5 the patient will be treated with maintenance Alimta and Keytruda. EXAM: MRI HEAD WITHOUT AND WITH CONTRAST TECHNIQUE: Multiplanar, multiecho pulse sequences of the brain and surrounding structures were obtained without and with intravenous contrast. The 3T scanner was down at the time of the study. The patient requested that the study be done without rescheduling, so it was performed on the 1.5T scanner. CONTRAST:  7.5 mL Gadavist COMPARISON:  Brain MRI 12/03/2018 and  08/22/2018 FINDINGS: BRAIN: Examination is degraded by motion. No residual contrast enhancement at the site of the previously demonstrated right cerebellar lesions. There is punctate contrast enhancement within the left cerebellar hemisphere (series 16, image 20). No supratentorial contrast-enhancement. Midline structures are normal. Mild periventricular hyperintense T2-weighted signal. No hemorrhage or extra-axial collection. VASCULAR: The major intracranial arterial and venous sinus flow voids are normal. SKULL AND UPPER CERVICAL SPINE: Minimal contrast enhancement of the left C1 vertebra is unchanged from the most recent study and remains decreased compared to 08/22/2018. SINUSES/ORBITS: No fluid levels or advanced mucosal thickening. No mastoid or middle ear effusion. The orbits are normal. IMPRESSION: 1. Examination degraded by motion and acquisition on 1.5T scanner (the 3T scanner was nonfunctional at the time of the study). 2. Punctate foci of contrast enhancement within the left cerebellum are indeterminate on this scan. These could be small metastases, phase encoding artifacts or vascular structures. Attention on follow-up on 3T MRI scanner is recommended. 3. Persistent low T1-weighted signal of the left lateral mass of C1 with diminished contrast enhancement. Electronically Signed   By: Tony [name] M.D.   On: 03/04/2019 20:12    Impression/Plan:  Stable clinically and radiographically  Tony [name] will arrange repeat MRI in 12mo and f/u with [facility] at that time  Continue close f/u with med onc for systemic disease.  Call if questions arise sooner.   This encounter was provided by telemedicine platform phone The patient has given verbal consent for this type of encounter and has been advised to only accept a meeting of this type in a secure network environment. The time spent during this encounter was 5 minutes. The attendants for this meeting include Tony [name]  and Tony [name].   During the encounter, Tony [name] was located at [facility] Radiation Oncology Department.  Tony [name] was located at home.  _____________________________________   Tony Gibson, MD

## 2019-03-15 ENCOUNTER — Other Ambulatory Visit: Payer: Self-pay

## 2019-03-15 ENCOUNTER — Encounter (HOSPITAL_COMMUNITY): Payer: Self-pay

## 2019-03-15 ENCOUNTER — Ambulatory Visit (HOSPITAL_COMMUNITY)
Admission: RE | Admit: 2019-03-15 | Discharge: 2019-03-15 | Disposition: A | Discharge status: Home / Self Care | Payer: Medicare Other | Source: Ambulatory Visit | Attending: Internal Medicine | Admitting: Internal Medicine

## 2019-03-15 DIAGNOSIS — C3490 Malignant neoplasm of unspecified part of unspecified bronchus or lung: Secondary | ICD-10-CM | POA: Diagnosis present

## 2019-03-15 MED ORDER — IOHEXOL 300 MG/ML  SOLN
30.0000 mL | Freq: Once | INTRAMUSCULAR | Status: AC | PRN
  Administered 2019-03-15: 30 mL via ORAL

## 2019-03-15 MED ORDER — IOHEXOL 300 MG/ML  SOLN
100.0000 mL | Freq: Once | INTRAMUSCULAR | Status: AC | PRN
  Administered 2019-03-15: 100 mL via INTRAVENOUS

## 2019-03-15 MED ORDER — SODIUM CHLORIDE (PF) 0.9 % IJ SOLN
INTRAMUSCULAR | Status: AC
  Filled 2019-03-15: qty 50

## 2019-03-15 MED ORDER — HEPARIN SOD (PORK) LOCK FLUSH 100 UNIT/ML IV SOLN
INTRAVENOUS | Status: AC
  Filled 2019-03-15: qty 5

## 2019-03-16 ENCOUNTER — Inpatient Hospital Stay (HOSPITAL_BASED_OUTPATIENT_CLINIC_OR_DEPARTMENT_OTHER): Payer: Medicare Other | Admitting: Physician Assistant

## 2019-03-16 ENCOUNTER — Other Ambulatory Visit: Payer: Self-pay

## 2019-03-16 ENCOUNTER — Inpatient Hospital Stay: Payer: Medicare Other

## 2019-03-16 ENCOUNTER — Other Ambulatory Visit: Payer: Self-pay | Admitting: *Deleted

## 2019-03-16 ENCOUNTER — Encounter: Payer: Self-pay | Admitting: Physician Assistant

## 2019-03-16 VITALS — BP 115/57 | HR 67 | Temp 98.5°F | Resp 18 | Ht 69.0 in | Wt 168.9 lb

## 2019-03-16 DIAGNOSIS — C771 Secondary and unspecified malignant neoplasm of intrathoracic lymph nodes: Secondary | ICD-10-CM

## 2019-03-16 DIAGNOSIS — D6481 Anemia due to antineoplastic chemotherapy: Secondary | ICD-10-CM | POA: Diagnosis not present

## 2019-03-16 DIAGNOSIS — C3491 Malignant neoplasm of unspecified part of right bronchus or lung: Secondary | ICD-10-CM

## 2019-03-16 DIAGNOSIS — Z5111 Encounter for antineoplastic chemotherapy: Secondary | ICD-10-CM

## 2019-03-16 DIAGNOSIS — D649 Anemia, unspecified: Secondary | ICD-10-CM

## 2019-03-16 DIAGNOSIS — C3431 Malignant neoplasm of lower lobe, right bronchus or lung: Secondary | ICD-10-CM

## 2019-03-16 DIAGNOSIS — C7931 Secondary malignant neoplasm of brain: Secondary | ICD-10-CM

## 2019-03-16 DIAGNOSIS — C7970 Secondary malignant neoplasm of unspecified adrenal gland: Secondary | ICD-10-CM

## 2019-03-16 DIAGNOSIS — Z5112 Encounter for antineoplastic immunotherapy: Secondary | ICD-10-CM | POA: Diagnosis not present

## 2019-03-16 DIAGNOSIS — C7951 Secondary malignant neoplasm of bone: Secondary | ICD-10-CM

## 2019-03-16 DIAGNOSIS — Z95828 Presence of other vascular implants and grafts: Secondary | ICD-10-CM

## 2019-03-16 LAB — CBC WITH DIFFERENTIAL (CANCER CENTER ONLY)
Abs Immature Granulocytes: 0.02 10*3/uL (ref 0.00–0.07)
Basophils Absolute: 0 10*3/uL (ref 0.0–0.1)
Basophils Relative: 0 %
Eosinophils Absolute: 0.1 10*3/uL (ref 0.0–0.5)
Eosinophils Relative: 1 %
HCT: 22.6 % — ABNORMAL LOW (ref 39.0–52.0)
Hemoglobin: 7.1 g/dL — ABNORMAL LOW (ref 13.0–17.0)
Immature Granulocytes: 0 %
Lymphocytes Relative: 14 %
Lymphs Abs: 0.7 10*3/uL (ref 0.7–4.0)
MCH: 35.9 pg — ABNORMAL HIGH (ref 26.0–34.0)
MCHC: 31.4 g/dL (ref 30.0–36.0)
MCV: 114.1 fL — ABNORMAL HIGH (ref 80.0–100.0)
Monocytes Absolute: 0.7 10*3/uL (ref 0.1–1.0)
Monocytes Relative: 14 %
Neutro Abs: 3.7 10*3/uL (ref 1.7–7.7)
Neutrophils Relative %: 71 %
Platelet Count: 172 10*3/uL (ref 150–400)
RBC: 1.98 MIL/uL — ABNORMAL LOW (ref 4.22–5.81)
RDW: 17.6 % — ABNORMAL HIGH (ref 11.5–15.5)
WBC Count: 5.2 10*3/uL (ref 4.0–10.5)
nRBC: 0 % (ref 0.0–0.2)

## 2019-03-16 LAB — CMP (CANCER CENTER ONLY)
ALT: 26 U/L (ref 0–44)
AST: 28 U/L (ref 15–41)
Albumin: 2.7 g/dL — ABNORMAL LOW (ref 3.5–5.0)
Alkaline Phosphatase: 99 U/L (ref 38–126)
Anion gap: 10 (ref 5–15)
BUN: 13 mg/dL (ref 8–23)
CO2: 23 mmol/L (ref 22–32)
Calcium: 8.6 mg/dL — ABNORMAL LOW (ref 8.9–10.3)
Chloride: 102 mmol/L (ref 98–111)
Creatinine: 1 mg/dL (ref 0.61–1.24)
GFR, Est AFR Am: >60 mL/min (ref >60)
GFR, Estimated: >60 mL/min (ref >60)
Glucose, Bld: 124 mg/dL — ABNORMAL HIGH (ref 70–99)
Potassium: 4.1 mmol/L (ref 3.5–5.1)
Sodium: 135 mmol/L (ref 135–145)
Total Bilirubin: 0.3 mg/dL (ref 0.3–1.2)
Total Protein: 6.9 g/dL (ref 6.5–8.1)

## 2019-03-16 LAB — TSH: TSH: 1.566 u[IU]/mL (ref 0.320–4.118)

## 2019-03-16 LAB — PREPARE RBC (CROSSMATCH)

## 2019-03-16 MED ORDER — CYANOCOBALAMIN 1000 MCG/ML IJ SOLN
1000.0000 ug | Freq: Once | INTRAMUSCULAR | Status: AC
  Administered 2019-03-16: 13:00:00 1000 ug via INTRAMUSCULAR

## 2019-03-16 MED ORDER — SODIUM CHLORIDE 0.9 % IV SOLN
800.0000 mg | Freq: Once | INTRAVENOUS | Status: AC
  Administered 2019-03-16: 13:00:00 800 mg via INTRAVENOUS
  Filled 2019-03-16: qty 20

## 2019-03-16 MED ORDER — SODIUM CHLORIDE 0.9% FLUSH
10.0000 mL | Freq: Once | INTRAVENOUS | Status: AC
  Administered 2019-03-16: 10 mL
  Filled 2019-03-16: qty 10

## 2019-03-16 MED ORDER — SODIUM CHLORIDE 0.9 % IV SOLN
Freq: Once | INTRAVENOUS | Status: AC
  Administered 2019-03-16: 12:00:00 via INTRAVENOUS
  Filled 2019-03-16: qty 250

## 2019-03-16 MED ORDER — SODIUM CHLORIDE 0.9% FLUSH
10.0000 mL | INTRAVENOUS | Status: DC | PRN
  Administered 2019-03-16: 13:00:00 10 mL
  Filled 2019-03-16: qty 10

## 2019-03-16 MED ORDER — HEPARIN SOD (PORK) LOCK FLUSH 100 UNIT/ML IV SOLN
500.0000 [IU] | Freq: Once | INTRAVENOUS | Status: AC | PRN
  Administered 2019-03-16: 500 [IU]
  Filled 2019-03-16: qty 5

## 2019-03-16 MED ORDER — CYANOCOBALAMIN 1000 MCG/ML IJ SOLN
INTRAMUSCULAR | Status: AC
  Filled 2019-03-16: qty 1

## 2019-03-16 MED ORDER — SODIUM CHLORIDE 0.9 % IV SOLN
200.0000 mg | Freq: Once | INTRAVENOUS | Status: AC
  Administered 2019-03-16: 200 mg via INTRAVENOUS
  Filled 2019-03-16: qty 8

## 2019-03-16 MED ORDER — PROCHLORPERAZINE MALEATE 10 MG PO TABS
ORAL_TABLET | ORAL | Status: AC
  Filled 2019-03-16: qty 1

## 2019-03-16 MED ORDER — PROCHLORPERAZINE MALEATE 10 MG PO TABS
10.0000 mg | ORAL_TABLET | Freq: Once | ORAL | Status: AC
  Administered 2019-03-16: 12:00:00 10 mg via ORAL

## 2019-03-16 NOTE — Progress Notes (Signed)
Tony [name] OFFICE PROGRESS NOTE  [name], MD [place] [address]  DIAGNOSIS: Stage IV non-small cell lung cancer, adenocarcinoma who presented with a large right lower lobe lung mass in addition to metastatic disease to the mediastinal lymph nodes, adrenal glands, and bone involving the right shoulder,lumbar spines, and pelvic bone.  Molecular Studies by [employer]:Showed no actionable mutations.  PRIOR THERAPY: Palliative radiation to the right neck and shoulder.He is scheduled to have [place] to the brain lesions on 09/01/2018.  CURRENT THERAPY: Palliative systemic chemotherapy with carboplatin for an AUC of 5, Alimta 400 mg/m, and Keytruda 200 mg IV every 3 weeks. First dose September 30, 2018.  Status post 8 cycles.  Starting from cycle #5 the patient will be treated with maintenance Alimta and Keytruda.   INTERVAL HISTORY: Tony [name] 78 y.o. male returns to the clinic for a follow up visit. The patient is feeling well without any concerning complaints except for fatigue. The patient is tolerating his treatment well without any adverse effects. He denies any fever, chills, night sweats, or weight loss. He denies any chest pain, shortness of breath, cough, or hemoptysis. The patient reports new onset diarrhea starting this morning. He had two loose bowel movements since this time. He denies any associate symptoms including abdominal pain, bloody stool, nausea, or vomiting. He denies any bleeding or bruising including melena, epistaxis, hematuria, hematochezia, or gingival bleeding. He denies any headaches or visual changes. He denies any rashes or skin changes. He recently had a restaging CT scan. He is here for evaluation and to review his scan results before starting cycle #9.   MEDICAL HISTORY: Past Medical History:  Diagnosis Date  . Anemia   . Brain metastases ([facility])   . CAD (coronary artery disease)   . Constipation   . Gout   .  Heart disease   . History of blood transfusion   . History of radiation therapy 09/01/2018   brain, cerebella (5 sites)/ 20 Gy in 1 fraction.  Marland Kitchen HLD (hyperlipidemia) 10/13/2018  . HTN (hypertension) 10/13/2018  . Hyperlipidemia   . Hypertension   . Lung cancer ([place])   . Mass of lower lobe of right lung   . Myocardial infarction ([facility])    MI in 2000  . Peripheral vascular disease ([facility])    Abdominal Aortic Aneurysm  . Prostate cancer [facility]) Nov. 2012  . Ulcer     ALLERGIES:  has No Known Allergies.  MEDICATIONS:  Current Outpatient Medications  Medication Sig Dispense Refill  . acetaminophen (TYLENOL) 650 MG CR tablet Take 650 mg by mouth daily as needed for pain.     Marland Kitchen aspirin 81 MG tablet Take 81 mg by mouth every evening.     . colchicine 0.6 MG tablet Take 0.6 mg by mouth daily.    . folic acid (FOLVITE) 102 MCG tablet Take 800 mcg by mouth daily.     Marland Kitchen gabapentin (NEURONTIN) 100 MG capsule TAKE 2 CAPSULES BY MOUTH AT BEDTIME 180 capsule 1  . metoprolol tartrate (LOPRESSOR) 50 MG tablet Take 50 mg by mouth 2 (two) times daily.    . Multiple Vitamins-Minerals (MULTIVITAMIN WITH MINERALS) tablet Take 1 tablet by mouth daily.    . niacin 500 MG tablet Take 500 mg by mouth 2 (two) times daily.    . Omega-3 Fatty Acids (FISH OIL) 1200 MG CAPS Take 1,200 mg by mouth daily.     Marland Kitchen omeprazole (PRILOSEC OTC) 20 MG  tablet Take 10 mg by mouth 2 (two) times a week.    . polyethylene glycol (MIRALAX / GLYCOLAX) packet Take 17 g by mouth 4 (four) times a week.     . pravastatin (PRAVACHOL) 10 MG tablet Take 10 mg by mouth every evening.     . Psyllium (METAMUCIL FIBER PO) Take 1 Dose by mouth 3 (three) times a week.     . ramipril (ALTACE) 10 MG capsule Take 10 mg by mouth every morning.     . triamterene-hydrochlorothiazide (MAXZIDE-25) 37.5-25 MG tablet Take 0.5 tablets by mouth every morning.     . mupirocin ointment (BACTROBAN) 2 % Place 1 application into the nose 3 (three) times daily.  Place over area 3 times daily (Patient not taking: Reported on 12/01/2018) 30 g 0   No current facility-administered medications for this visit.    Facility-Administered Medications Ordered in Other Visits  Medication Dose Route Frequency Provider Last Rate Last Dose  . cyanocobalamin ((VITAMIN B-12)) injection 1,000 mcg  1,000 mcg Intramuscular Once Curt Bears, MD      . heparin lock flush 100 unit/mL  500 Units Intracatheter Once PRN Curt Bears, MD      . PEMEtrexed (ALIMTA) 800 mg in sodium chloride 0.9 % 100 mL chemo infusion  800 mg Intravenous Once Curt Bears, MD      . sodium chloride flush (NS) 0.9 % injection 10 mL  10 mL Intracatheter PRN Curt Bears, MD        SURGICAL HISTORY:  Past Surgical History:  Procedure Laterality Date  . ANAL FISSURECTOMY    . COLONOSCOPY    . CORONARY ARTERY BYPASS GRAFT     2000  . EYE SURGERY Left Nov. 2013   Cataract  . EYE SURGERY Right Feb. 2014   Cataract  . HERNIA REPAIR    . PORTACATH PLACEMENT Left 09/24/2018   Procedure: INSERTION PORT-A-CATH;  Surgeon: Melrose Nakayama, MD;  Location: [facility];  Service: Thoracic;  Laterality: Left;  . PR VEIN BYPASS GRAFT,AORTO-FEM-POP  2000  . PROSTATE SURGERY  Nov. 2012  . ROBOT ASSISTED LAPAROSCOPIC RADICAL PROSTATECTOMY  08/08/2011   Procedure: ROBOTIC ASSISTED LAPAROSCOPIC RADICAL PROSTATECTOMY LEVEL 2;  Surgeon: Dutch Gray, MD;  Location: [facility];  Service: Urology;  Laterality: Bilateral;  with Bilateral Pelvic Lymphadenectomy   . SHOULDER SURGERY  1980'S  . VIDEO BRONCHOSCOPY WITH ENDOBRONCHIAL ULTRASOUND N/A 08/06/2018   Procedure: VIDEO BRONCHOSCOPY WITH ENDOBRONCHIAL ULTRASOUND;  Surgeon: Melrose Nakayama, MD;  Location: [facility];  Service: Thoracic;  Laterality: N/A;  . WISDOM TOOTH EXTRACTION      REVIEW OF SYSTEMS:   Review of Systems  Constitutional: Positive for fatigue. Negative for appetite change, chills, fever and unexpected weight change.  HENT:    Negative for mouth sores, nosebleeds, sore throat and trouble swallowing.   Eyes: Negative for eye problems and icterus.  Respiratory: Negative for cough, hemoptysis, shortness of breath and wheezing.   Cardiovascular: Negative for chest pain and leg swelling.  Gastrointestinal: Positive for diarrhea. Negative for abdominal pain, constipation, nausea and vomiting.  Genitourinary: Negative for bladder incontinence, difficulty urinating, dysuria, frequency and hematuria.   Musculoskeletal: Negative for back pain, gait problem, neck pain and neck stiffness.  Skin: Negative for itching and rash.  Neurological: Negative for dizziness, extremity weakness, gait problem, headaches, light-headedness and seizures.  Hematological: Negative for adenopathy. Does not bruise/bleed easily.  Psychiatric/Behavioral: Negative for confusion, depression and sleep disturbance. The patient is not nervous/anxious.  PHYSICAL EXAMINATION:  Blood pressure (!) 115/57, pulse 67, temperature 98.5 F (36.9 C), temperature source Temporal, resp. rate 18, height 5\' 9"  (1.753 m), weight 168 lb 14.4 oz (76.6 kg), SpO2 96 %.  ECOG PERFORMANCE STATUS: 1 - Symptomatic but completely ambulatory  Physical Exam  Constitutional: Oriented to person, place, and time and well-developed, well-nourished, and in no distress.  HENT:  Head: Normocephalic and atraumatic.  Mouth/Throat: Oropharynx is clear and moist. No oropharyngeal exudate.  Eyes: Conjunctivae are normal. Right eye exhibits no discharge. Left eye exhibits no discharge. No scleral icterus.  Neck: Normal range of motion. Neck supple.  Cardiovascular: Normal rate, regular rhythm, normal heart sounds and intact distal pulses.   Pulmonary/Chest: Effort normal and breath sounds normal. No respiratory distress. No wheezes. No rales.  Abdominal: Soft. Bowel sounds are normal. Exhibits no distension and no mass. There is no tenderness.  Musculoskeletal: Normal range of  motion. Exhibits no edema.  Lymphadenopathy:    No cervical adenopathy.  Neurological: Alert and oriented to person, place, and time. Exhibits normal muscle tone. Gait normal. Coordination normal.  Skin: Skin is warm and dry. No rash noted. Not diaphoretic. No erythema. No pallor.  Psychiatric: Mood, memory and judgment normal.  Vitals reviewed.  LABORATORY DATA: Lab Results  Component Value Date   WBC 5.2 03/16/2019   HGB 7.1 (L) 03/16/2019   HCT 22.6 (L) 03/16/2019   MCV 114.1 (H) 03/16/2019   PLT 172 03/16/2019      Chemistry      Component Value Date/Time   NA 135 03/16/2019 1010   NA 139 08/10/2012 1328   K 4.1 03/16/2019 1010   K 4.1 08/10/2012 1328   CL 102 03/16/2019 1010   CL 105 08/10/2012 1328   CO2 23 03/16/2019 1010   CO2 29 08/10/2012 1328   BUN 13 03/16/2019 1010   BUN 18.0 08/10/2012 1328   CREATININE 1.00 03/16/2019 1010   CREATININE 1.1 08/10/2012 1328      Component Value Date/Time   CALCIUM 8.6 (L) 03/16/2019 1010   CALCIUM 9.0 08/10/2012 1328   ALKPHOS 99 03/16/2019 1010   ALKPHOS 144 08/10/2012 1328   AST 28 03/16/2019 1010   AST 42 (H) 08/10/2012 1328   ALT 26 03/16/2019 1010   ALT 55 08/10/2012 1328   BILITOT 0.3 03/16/2019 1010   BILITOT 0.91 08/10/2012 1328       RADIOGRAPHIC STUDIES:  Ct Chest W Contrast  Result Date: 03/15/2019 CLINICAL DATA:  Restaging of non-small cell lung cancer EXAM: CT CHEST, ABDOMEN, AND PELVIS WITH CONTRAST TECHNIQUE: Multidetector CT imaging of the chest, abdomen and pelvis was performed following the standard protocol during bolus administration of intravenous contrast. CONTRAST:  17mL OMNIPAQUE IOHEXOL 300 MG/ML  SOLN COMPARISON:  None. FINDINGS: CT CHEST FINDINGS Cardiovascular: Left Port-A-Cath tip: SVC. Coronary, aortic arch, and branch vessel atherosclerotic vascular disease. Prior CABG. Mediastinum/Nodes: Right hilar node 1.1 cm in short axis on image 28/2, formerly 0.8 cm by my measurements. Stable  mildly prominent right infrahilar lymph nodes. Lungs/Pleura: The right lower lobe mass medially measures 3.3 by 2.7 cm on image 37/2, previously 3.1 by 2.6 cm by my measurements. The lesion is about 5.8 cm vertically. Mild scattered scarring in the lungs. Interstitial accentuation at the lung bases, right greater than left. Airway plugging primarily in branches of the right lower lobe. Musculoskeletal: Mixed density but partially destructive lesion of the right glenoid and adjacent scapular, similar to prior exam. CT ABDOMEN PELVIS FINDINGS Hepatobiliary:  The complex segment 2 lesion measures 1.6 by 1.2 cm my measurements, previously the same. A 0.5 cm lesion in the dome of the right hepatic lobe on image 50/2 is probably a cyst although technically nonspecific due to small size. There is a stable hypodense lesion inferiorly in the right hepatic lobe measuring 0.9 by 0.6 cm on image 59/2. Several other tiny lesions are technically nonspecific. Mild gallbladder wall thickening although this may be partially due to contraction. Pancreas: Unremarkable Spleen: Unremarkable Adrenals/Urinary Tract: Both adrenal glands appear normal. Stable bilateral renal cysts. 2 mm right kidney upper pole nonobstructive renal calculus on image 91/5. Vascular calcifications in both renal hila. Stomach/Bowel: Sigmoid colon diverticulosis. Vascular/Lymphatic: Aortoiliac atherosclerotic vascular disease. Infrarenal abdominal aortic aneurysm 3.7 by 2.7 cm on image 79/2 with mural thrombus, overall stable size. 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 Occluded right superficial femoral artery but not the profunda branch. Reproductive: Prostatectomy Other: No supplemental non-categorized findings. Musculoskeletal: Primarily sclerotic metastatic lesion involving the spinous process and adjacent pedicles as well as the right inferior half of the L1 vertebra, with stable endplate  compression along the suspected metastatic lesion at L1 in the inferior endplate, not appreciably changed from 01/29/2019. 1.8 by 1.8 cm left iliac crest sclerotic lesion compatible with osseous metastatic disease, previously 1.7 cm in diameter, slightly increased sclerosis compared to the prior exam. IMPRESSION: 1. The right lower lobe mass is minimally larger than before and the right hilar lymph node is also mildly enlarged and increased from prior. 2. The osseous metastatic lesions in the right scapula, left iliac bone, and L1 vertebra appears similar to prior, perhaps minimally more sclerotic. 3. Similar small lesions in the liver, several of which remain nonspecific. 4. Infrarenal abdominal aortic aneurysm, 3.7 cm in diameter. If there is no intervening imaging follow up otherwise, recommend followup by [place] in 2 years. This recommendation follows ACR consensus guidelines: White Paper of the ACR Incidental Findings Committee II on Vascular Findings. J Am Coll Radiol 2013; 10:789-794. 5. Other imaging findings of potential clinical significance: Aortic Atherosclerosis (ICD10-I70.0). Coronary atherosclerosis. Prior CABG. Chronic interstitial accentuation especially favoring the lung bases and the periphery. Nonobstructive right nephrolithiasis. Sigmoid colon diverticulosis. Prostatectomy. Electronically Signed   By: Van Clines M.D.   On: 03/15/2019 15:09   Mr Jeri Cos JE Contrast  Result Date: 03/04/2019 CLINICAL DATA:  Stereotactic radio surgery follow-up. Stage IV non-small cell lung cancer, adenocarcinoma. From oncology notes: PRIOR THERAPY: 1) palliative radiation to the right neck and shoulder. He is scheduled to have [place] to the brain lesions on 09/01/2018.CURRENT THERAPY: 1) palliative systemic chemotherapy with carboplatin for an AUC of 5, Alimta 400 mg/m, and Keytruda 200 mg IV every 3 weeks. First dose September 30, 2018. Status post 7 cycles. Starting from cycle #5 the patient will be treated  with maintenance Alimta and Keytruda. EXAM: MRI HEAD WITHOUT AND WITH CONTRAST TECHNIQUE: Multiplanar, multiecho pulse sequences of the brain and surrounding structures were obtained without and with intravenous contrast. The 3T scanner was down at the time of the study. The patient requested that the study be done without rescheduling, so it was performed on the 1.5T scanner. CONTRAST:  7.5 mL Gadavist COMPARISON:  Brain MRI 12/03/2018 and 08/22/2018 FINDINGS: BRAIN: Examination is degraded by motion. No residual contrast enhancement at the site of the previously demonstrated right cerebellar lesions. There is punctate contrast enhancement within the left cerebellar hemisphere (series 16, image 20). No  supratentorial contrast-enhancement. Midline structures are normal. Mild periventricular hyperintense T2-weighted signal. No hemorrhage or extra-axial collection. VASCULAR: The major intracranial arterial and venous sinus flow voids are normal. SKULL AND UPPER CERVICAL SPINE: Minimal contrast enhancement of the left C1 vertebra is unchanged from the most recent study and remains decreased compared to 08/22/2018. SINUSES/ORBITS: No fluid levels or advanced mucosal thickening. No mastoid or middle ear effusion. The orbits are normal. IMPRESSION: 1. Examination degraded by motion and acquisition on 1.5T scanner (the 3T scanner was nonfunctional at the time of the study). 2. Punctate foci of contrast enhancement within the left cerebellum are indeterminate on this scan. These could be small metastases, phase encoding artifacts or vascular structures. Attention on follow-up on 3T MRI scanner is recommended. 3. Persistent low T1-weighted signal of the left lateral mass of C1 with diminished contrast enhancement. Electronically Signed   By: Ulyses Jarred M.D.   On: 03/04/2019 20:12   Ct Abdomen Pelvis W Contrast  Result Date: 03/15/2019 CLINICAL DATA:  Restaging of non-small cell lung cancer EXAM: CT CHEST, ABDOMEN, AND  PELVIS WITH CONTRAST TECHNIQUE: Multidetector CT imaging of the chest, abdomen and pelvis was performed following the standard protocol during bolus administration of intravenous contrast. CONTRAST:  136mL OMNIPAQUE IOHEXOL 300 MG/ML  SOLN COMPARISON:  None. FINDINGS: CT CHEST FINDINGS Cardiovascular: Left Port-A-Cath tip: SVC. Coronary, aortic arch, and branch vessel atherosclerotic vascular disease. Prior CABG. Mediastinum/Nodes: Right hilar node 1.1 cm in short axis on image 28/2, formerly 0.8 cm by my measurements. Stable mildly prominent right infrahilar lymph nodes. Lungs/Pleura: The right lower lobe mass medially measures 3.3 by 2.7 cm on image 37/2, previously 3.1 by 2.6 cm by my measurements. The lesion is about 5.8 cm vertically. Mild scattered scarring in the lungs. Interstitial accentuation at the lung bases, right greater than left. Airway plugging primarily in branches of the right lower lobe. Musculoskeletal: Mixed density but partially destructive lesion of the right glenoid and adjacent scapular, similar to prior exam. CT ABDOMEN PELVIS FINDINGS Hepatobiliary: The complex segment 2 lesion measures 1.6 by 1.2 cm my measurements, previously the same. A 0.5 cm lesion in the dome of the right hepatic lobe on image 50/2 is probably a cyst although technically nonspecific due to small size. There is a stable hypodense lesion inferiorly in the right hepatic lobe measuring 0.9 by 0.6 cm on image 59/2. Several other tiny lesions are technically nonspecific. Mild gallbladder wall thickening although this may be partially due to contraction. Pancreas: Unremarkable Spleen: Unremarkable Adrenals/Urinary Tract: Both adrenal glands appear normal. Stable bilateral renal cysts. 2 mm right kidney upper pole nonobstructive renal calculus on image 91/5. Vascular calcifications in both renal hila. Stomach/Bowel: Sigmoid colon diverticulosis. Vascular/Lymphatic: Aortoiliac atherosclerotic vascular disease. Infrarenal  abdominal aortic aneurysm 3.7 by 2.7 cm on image 79/2 with mural thrombus, overall stable size. 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 Occluded right superficial femoral artery but not the profunda branch. Reproductive: Prostatectomy Other: No supplemental non-categorized findings. Musculoskeletal: Primarily sclerotic metastatic lesion involving the spinous process and adjacent pedicles as well as the right inferior half of the L1 vertebra, with stable endplate compression along the suspected metastatic lesion at L1 in the inferior endplate, not appreciably changed from 01/29/2019. 1.8 by 1.8 cm left iliac crest sclerotic lesion compatible with osseous metastatic disease, previously 1.7 cm in diameter, slightly increased sclerosis compared to the prior exam. IMPRESSION: 1. The right lower lobe mass is minimally larger than  before and the right hilar lymph node is also mildly enlarged and increased from prior. 2. The osseous metastatic lesions in the right scapula, left iliac bone, and L1 vertebra appears similar to prior, perhaps minimally more sclerotic. 3. Similar small lesions in the liver, several of which remain nonspecific. 4. Infrarenal abdominal aortic aneurysm, 3.7 cm in diameter. If there is no intervening imaging follow up otherwise, recommend followup by [place] in 2 years. This recommendation follows ACR consensus guidelines: White Paper of the ACR Incidental Findings Committee II on Vascular Findings. J Am Coll Radiol 2013; 10:789-794. 5. Other imaging findings of potential clinical significance: Aortic Atherosclerosis (ICD10-I70.0). Coronary atherosclerosis. Prior CABG. Chronic interstitial accentuation especially favoring the lung bases and the periphery. Nonobstructive right nephrolithiasis. Sigmoid colon diverticulosis. Prostatectomy. Electronically Signed   By: Van Clines M.D.   On: 03/15/2019 15:09     ASSESSMENT/PLAN:  This  is a very pleasant 78 year old caucasian male with stage IV non small cell lung cancer, adenocarcinoma. He presented with a large right lower lobe lung mass in addition to mediastinal lymphadenopathy. He also has metastatic disease to the adrenal gland, right shoulder, lumbar spine, and pelvic bones. He was diagnosed in November 2019.   The patient underwent palliative radiotherapy to the right shoulder and painful metastatic bone lesions.   He is currently undergoing systemic chemotherapy with carboplatin for an AUC of 5, Alimta 500 mg/m2, and Keytruda 200 mg IV every 3 weeks. He is status post 8 cycles. Starting from cycle #5, the patient will receive maintenance treatment with Keytruda and Alimta.   The patient recently had a restaging CT scan performed.  Dr. Julien Nordmann personally independently reviewed the scan and discussed the results with the patient today.  The scan showed stable disease. Dr. Earlie Server recommends that the patient proceed with cycle #9 today as scheduled.   The patient's hemoglobin is low at 7.1 today. We will arrange for the patient to receive 2 units of pRBCs tomorrow.   We will see the patient back for a follow up visit in 3 weeks for evaluation prior to starting cycle #10.   For the patient's new onset diarrhea, the patient was advised to try to take imodium OTC for his symptoms. If his symptoms persist despite using imodium, or if he develops new or worsening symptoms, he was instructed to contact us for further evaluation.   The patient was advised to call immediately if he has any concerning symptoms in the interval. The patient voices understanding of current disease status and treatment options and is in agreement with the current care plan. All questions were answered. The patient knows to call the clinic with any problems, questions or concerns. We can certainly see the patient much sooner if necessary  Orders Placed This Encounter  Procedures  . Practitioner  attestation of consent    I, the ordering practitioner, attest that I have discussed with the patient the benefits, risks, side effects, alternatives, likelihood of achieving goals and potential problems during recovery for the procedure listed.    Standing Status:   Future    Standing Expiration Date:   03/15/2020    Order Specific Question:   Procedure    Answer:   Blood Product(s)  . Complete patient signature process for consent form    Standing Status:   Future    Standing Expiration Date:   03/15/2020  . Care order/instruction    Transfuse Parameters    Standing Status:   Future  Standing Expiration Date:   03/15/2020  . Type and screen    Standing Status:   Future    Number of Occurrences:   1    Standing Expiration Date:   03/15/2020     Tobe Sos Heilingoetter, PA-C 03/16/19  ADDENDUM: Hematology/Oncology Attending: I had a face-to-face encounter with the patient today.  I recommended his care plan.  This is a very pleasant 78 years old white male with metastatic non-small cell lung cancer, adenocarcinoma with no actionable mutations.  The patient is currently undergoing systemic chemotherapy with carboplatin, Alimta and Keytruda status post 8 cycles.  Starting from cycle #5 he is currently on treatment with maintenance Alimta and Keytruda. The patient continues to tolerate his treatment well except for fatigue from the chemotherapy-induced anemia. He had repeat CT scan of the chest, abdomen and pelvis performed recently.  I personally and independently reviewed the scans and discussed the results with the patient today. He has a scan showed no concerning findings for disease progression. I recommended for the patient to proceed with cycle #9 of his treatment today. For the chemotherapy-induced anemia, I will arrange for the patient to receive 2 units of PRBCs transfusion. He will come back for follow-up visit in 3 weeks for evaluation before starting cycle #10. He was advised  to call immediately if he has any concerning symptoms in the interval.  Disclaimer: This note was dictated with voice recognition software. Similar sounding words can inadvertently be transcribed and may be missed upon review. Eilleen Kempf, MD 03/16/19

## 2019-03-16 NOTE — Patient Instructions (Signed)
[facility] Discharge Instructions for Patients Receiving Chemotherapy  Today you received the following chemotherapy agents Keytruda and Alimta.  To help prevent nausea and vomiting after your treatment, we encourage you to take your nausea medication as directed.   If you develop nausea and vomiting that is not controlled by your nausea medication, call the clinic.   BELOW ARE SYMPTOMS THAT SHOULD BE REPORTED IMMEDIATELY:  *FEVER GREATER THAN 100.5 F  *CHILLS WITH OR WITHOUT FEVER  NAUSEA AND VOMITING THAT IS NOT CONTROLLED WITH YOUR NAUSEA MEDICATION  *UNUSUAL SHORTNESS OF BREATH  *UNUSUAL BRUISING OR BLEEDING  TENDERNESS IN MOUTH AND THROAT WITH OR WITHOUT PRESENCE OF ULCERS  *URINARY PROBLEMS  *BOWEL PROBLEMS  UNUSUAL RASH Items with * indicate a potential emergency and should be followed up as soon as possible.  Feel free to call the clinic should you have any questions or concerns. The clinic phone number is [phone].  Please show the [facility] at check-in to the Emergency Department and triage nurse.

## 2019-03-16 NOTE — Progress Notes (Signed)
Ok to treat today 03/16/2019 with current labs.  Patient is planned to have 2 units of blood transfused tomorrow. Per [employer], [place]

## 2019-03-16 NOTE — Addendum Note (Signed)
Addended by: Wylene Men on: 03/16/2019 01:32 PM   Modules accepted: Orders

## 2019-03-17 ENCOUNTER — Telehealth: Payer: Self-pay | Admitting: Physician Assistant

## 2019-03-17 ENCOUNTER — Inpatient Hospital Stay: Payer: Medicare Other

## 2019-03-17 ENCOUNTER — Other Ambulatory Visit: Payer: Self-pay

## 2019-03-17 VITALS — BP 105/48 | HR 57 | Temp 97.7°F | Resp 20

## 2019-03-17 DIAGNOSIS — Z95828 Presence of other vascular implants and grafts: Secondary | ICD-10-CM

## 2019-03-17 DIAGNOSIS — D649 Anemia, unspecified: Secondary | ICD-10-CM

## 2019-03-17 DIAGNOSIS — D6481 Anemia due to antineoplastic chemotherapy: Secondary | ICD-10-CM

## 2019-03-17 DIAGNOSIS — C3491 Malignant neoplasm of unspecified part of right bronchus or lung: Secondary | ICD-10-CM

## 2019-03-17 DIAGNOSIS — T451X5A Adverse effect of antineoplastic and immunosuppressive drugs, initial encounter: Secondary | ICD-10-CM

## 2019-03-17 DIAGNOSIS — Z5112 Encounter for antineoplastic immunotherapy: Secondary | ICD-10-CM | POA: Diagnosis not present

## 2019-03-17 MED ORDER — SODIUM CHLORIDE 0.9% FLUSH
10.0000 mL | INTRAVENOUS | Status: DC | PRN
  Filled 2019-03-17: qty 10

## 2019-03-17 MED ORDER — SODIUM CHLORIDE 0.9% IV SOLUTION
250.0000 mL | Freq: Once | INTRAVENOUS | Status: AC
  Administered 2019-03-17: 250 mL via INTRAVENOUS
  Filled 2019-03-17: qty 250

## 2019-03-17 MED ORDER — DIPHENHYDRAMINE HCL 25 MG PO CAPS
ORAL_CAPSULE | ORAL | Status: AC
  Filled 2019-03-17: qty 2

## 2019-03-17 MED ORDER — DIPHENHYDRAMINE HCL 25 MG PO CAPS
25.0000 mg | ORAL_CAPSULE | Freq: Once | ORAL | Status: AC
  Administered 2019-03-17: 25 mg via ORAL

## 2019-03-17 MED ORDER — HEPARIN SOD (PORK) LOCK FLUSH 100 UNIT/ML IV SOLN
500.0000 [IU] | Freq: Once | INTRAVENOUS | Status: AC
  Administered 2019-03-17: 500 [IU]
  Filled 2019-03-17: qty 5

## 2019-03-17 MED ORDER — HEPARIN SOD (PORK) LOCK FLUSH 100 UNIT/ML IV SOLN
500.0000 [IU] | Freq: Every day | INTRAVENOUS | Status: DC | PRN
  Filled 2019-03-17: qty 5

## 2019-03-17 MED ORDER — ACETAMINOPHEN 325 MG PO TABS
ORAL_TABLET | ORAL | Status: AC
  Filled 2019-03-17: qty 2

## 2019-03-17 MED ORDER — ACETAMINOPHEN 325 MG PO TABS
650.0000 mg | ORAL_TABLET | Freq: Once | ORAL | Status: AC
  Administered 2019-03-17: 650 mg via ORAL

## 2019-03-17 MED ORDER — SODIUM CHLORIDE 0.9% FLUSH
10.0000 mL | Freq: Once | INTRAVENOUS | Status: DC
  Filled 2019-03-17: qty 10

## 2019-03-17 MED ORDER — SODIUM CHLORIDE 0.9% FLUSH
10.0000 mL | INTRAVENOUS | Status: AC | PRN
  Administered 2019-03-17: 10 mL
  Filled 2019-03-17: qty 10

## 2019-03-17 NOTE — Patient Instructions (Signed)
Blood Transfusion, Adult, Care After This sheet gives you information about how to care for yourself after your procedure. Your doctor may also give you more specific instructions. If you have problems or questions, contact your doctor. Follow these instructions at home:   Take over-the-counter and prescription medicines only as told by your doctor.  Go back to your normal activities as told by your doctor.  Follow instructions from your doctor about how to take care of the area where an IV tube was put into your vein (insertion site). Make sure you: ? Wash your hands with soap and water before you change your bandage (dressing). If there is no soap and water, use hand sanitizer. ? Change your bandage as told by your doctor.  Check your IV insertion site every day for signs of infection. Check for: ? More redness, swelling, or pain. ? More fluid or blood. ? Warmth. ? Pus or a bad smell. Contact a doctor if:  You have more redness, swelling, or pain around the IV insertion site.  You have more fluid or blood coming from the IV insertion site.  Your IV insertion site feels warm to the touch.  You have pus or a bad smell coming from the IV insertion site.  Your pee (urine) turns pink, red, or brown.  You feel weak after doing your normal activities. Get help right away if:  You have signs of a serious allergic or body defense (immune) system reaction, including: ? Itchiness. ? Hives. ? Trouble breathing. ? Anxiety. ? Pain in your chest or lower back. ? Fever, flushing, and chills. ? Fast pulse. ? Rash. ? Watery poop (diarrhea). ? Throwing up (vomiting). ? Dark pee. ? Serious headache. ? Dizziness. ? Stiff neck. ? Yellow color in your face or the white parts of your eyes (jaundice). Summary  After a blood transfusion, return to your normal activities as told by your doctor.  Every day, check for signs of infection where the IV tube was put into your vein.  Some  signs of infection are warm skin, more redness and pain, more fluid or blood, and pus or a bad smell where the needle went in.  Contact your doctor if you feel weak or have any unusual symptoms. This information is not intended to replace advice given to you by your health care provider. Make sure you discuss any questions you have with your health care provider. Document Released: 09/30/2014 Document Revised: 05/03/2016 Document Reviewed: 05/03/2016 Elsevier Interactive Patient Education  2019 [employer]  

## 2019-03-17 NOTE — Telephone Encounter (Signed)
Scheduled appt per 6/23 los - pt to get an updated schedule next visit.

## 2019-03-18 LAB — BPAM RBC
Blood Product Expiration Date: 202007202359
Blood Product Expiration Date: 202007212359
ISSUE DATE / TIME: 202006240827
ISSUE DATE / TIME: 202006240827
Unit Type and Rh: 5100
Unit Type and Rh: 5100

## 2019-03-18 LAB — TYPE AND SCREEN
ABO/RH(D): O POS
Antibody Screen: NEGATIVE
Unit division: 0
Unit division: 0

## 2019-04-06 ENCOUNTER — Other Ambulatory Visit: Payer: Self-pay | Admitting: *Deleted

## 2019-04-06 ENCOUNTER — Inpatient Hospital Stay: Payer: Medicare Other

## 2019-04-06 ENCOUNTER — Inpatient Hospital Stay (HOSPITAL_BASED_OUTPATIENT_CLINIC_OR_DEPARTMENT_OTHER): Payer: Medicare Other | Admitting: Physician Assistant

## 2019-04-06 ENCOUNTER — Inpatient Hospital Stay: Payer: Medicare Other | Attending: Oncology

## 2019-04-06 ENCOUNTER — Other Ambulatory Visit: Payer: Self-pay

## 2019-04-06 VITALS — BP 126/72 | HR 84 | Temp 99.1°F | Resp 18 | Ht 69.0 in | Wt 166.0 lb

## 2019-04-06 DIAGNOSIS — C7970 Secondary malignant neoplasm of unspecified adrenal gland: Secondary | ICD-10-CM | POA: Diagnosis not present

## 2019-04-06 DIAGNOSIS — Z5112 Encounter for antineoplastic immunotherapy: Secondary | ICD-10-CM

## 2019-04-06 DIAGNOSIS — Z95828 Presence of other vascular implants and grafts: Secondary | ICD-10-CM

## 2019-04-06 DIAGNOSIS — D649 Anemia, unspecified: Secondary | ICD-10-CM | POA: Diagnosis not present

## 2019-04-06 DIAGNOSIS — Z79899 Other long term (current) drug therapy: Secondary | ICD-10-CM | POA: Insufficient documentation

## 2019-04-06 DIAGNOSIS — Z5111 Encounter for antineoplastic chemotherapy: Secondary | ICD-10-CM

## 2019-04-06 DIAGNOSIS — C7931 Secondary malignant neoplasm of brain: Secondary | ICD-10-CM | POA: Diagnosis not present

## 2019-04-06 DIAGNOSIS — C771 Secondary and unspecified malignant neoplasm of intrathoracic lymph nodes: Secondary | ICD-10-CM | POA: Diagnosis not present

## 2019-04-06 DIAGNOSIS — C7951 Secondary malignant neoplasm of bone: Secondary | ICD-10-CM | POA: Insufficient documentation

## 2019-04-06 DIAGNOSIS — I10 Essential (primary) hypertension: Secondary | ICD-10-CM | POA: Insufficient documentation

## 2019-04-06 DIAGNOSIS — C3491 Malignant neoplasm of unspecified part of right bronchus or lung: Secondary | ICD-10-CM

## 2019-04-06 DIAGNOSIS — C3431 Malignant neoplasm of lower lobe, right bronchus or lung: Secondary | ICD-10-CM | POA: Diagnosis not present

## 2019-04-06 LAB — CMP (CANCER CENTER ONLY)
ALT: 18 U/L (ref 0–44)
AST: 28 U/L (ref 15–41)
Albumin: 2.2 g/dL — ABNORMAL LOW (ref 3.5–5.0)
Alkaline Phosphatase: 80 U/L (ref 38–126)
Anion gap: 10 (ref 5–15)
BUN: 14 mg/dL (ref 8–23)
CO2: 24 mmol/L (ref 22–32)
Calcium: 8.7 mg/dL — ABNORMAL LOW (ref 8.9–10.3)
Chloride: 103 mmol/L (ref 98–111)
Creatinine: 0.83 mg/dL (ref 0.61–1.24)
GFR, Est AFR Am: >60 mL/min (ref >60)
GFR, Estimated: >60 mL/min (ref >60)
Glucose, Bld: 119 mg/dL — ABNORMAL HIGH (ref 70–99)
Potassium: 4 mmol/L (ref 3.5–5.1)
Sodium: 137 mmol/L (ref 135–145)
Total Bilirubin: 0.3 mg/dL (ref 0.3–1.2)
Total Protein: 7 g/dL (ref 6.5–8.1)

## 2019-04-06 LAB — CBC WITH DIFFERENTIAL (CANCER CENTER ONLY)
Abs Immature Granulocytes: 0.05 10*3/uL (ref 0.00–0.07)
Basophils Absolute: 0 10*3/uL (ref 0.0–0.1)
Basophils Relative: 0 %
Eosinophils Absolute: 0.1 10*3/uL (ref 0.0–0.5)
Eosinophils Relative: 2 %
HCT: 24.2 % — ABNORMAL LOW (ref 39.0–52.0)
Hemoglobin: 7.7 g/dL — ABNORMAL LOW (ref 13.0–17.0)
Immature Granulocytes: 1 %
Lymphocytes Relative: 13 %
Lymphs Abs: 0.8 10*3/uL (ref 0.7–4.0)
MCH: 33.3 pg (ref 26.0–34.0)
MCHC: 31.8 g/dL (ref 30.0–36.0)
MCV: 104.8 fL — ABNORMAL HIGH (ref 80.0–100.0)
Monocytes Absolute: 0.9 10*3/uL (ref 0.1–1.0)
Monocytes Relative: 16 %
Neutro Abs: 4.1 10*3/uL (ref 1.7–7.7)
Neutrophils Relative %: 68 %
Platelet Count: 214 10*3/uL (ref 150–400)
RBC: 2.31 MIL/uL — ABNORMAL LOW (ref 4.22–5.81)
RDW: 18 % — ABNORMAL HIGH (ref 11.5–15.5)
WBC Count: 5.9 10*3/uL (ref 4.0–10.5)
nRBC: 0 % (ref 0.0–0.2)

## 2019-04-06 LAB — TSH: TSH: 1.27 u[IU]/mL (ref 0.320–4.118)

## 2019-04-06 MED ORDER — PROCHLORPERAZINE MALEATE 10 MG PO TABS
10.0000 mg | ORAL_TABLET | Freq: Once | ORAL | Status: AC
  Administered 2019-04-06: 12:00:00 10 mg via ORAL

## 2019-04-06 MED ORDER — SODIUM CHLORIDE 0.9% FLUSH
10.0000 mL | Freq: Once | INTRAVENOUS | Status: AC
  Administered 2019-04-06: 10 mL
  Filled 2019-04-06: qty 10

## 2019-04-06 MED ORDER — SODIUM CHLORIDE 0.9 % IV SOLN
Freq: Once | INTRAVENOUS | Status: AC
  Administered 2019-04-06: 12:00:00 via INTRAVENOUS
  Filled 2019-04-06: qty 250

## 2019-04-06 MED ORDER — SODIUM CHLORIDE 0.9 % IV SOLN
200.0000 mg | Freq: Once | INTRAVENOUS | Status: AC
  Administered 2019-04-06: 200 mg via INTRAVENOUS
  Filled 2019-04-06: qty 8

## 2019-04-06 MED ORDER — PROCHLORPERAZINE MALEATE 10 MG PO TABS
ORAL_TABLET | ORAL | Status: AC
  Filled 2019-04-06: qty 1

## 2019-04-06 MED ORDER — SODIUM CHLORIDE 0.9% FLUSH
10.0000 mL | INTRAVENOUS | Status: DC | PRN
  Administered 2019-04-06: 10 mL
  Filled 2019-04-06: qty 10

## 2019-04-06 MED ORDER — HEPARIN SOD (PORK) LOCK FLUSH 100 UNIT/ML IV SOLN
500.0000 [IU] | Freq: Once | INTRAVENOUS | Status: AC | PRN
  Administered 2019-04-06: 500 [IU]
  Filled 2019-04-06: qty 5

## 2019-04-06 MED ORDER — SODIUM CHLORIDE 0.9 % IV SOLN
410.0000 mg/m2 | Freq: Once | INTRAVENOUS | Status: AC
  Administered 2019-04-06: 800 mg via INTRAVENOUS
  Filled 2019-04-06: qty 20

## 2019-04-06 NOTE — Progress Notes (Signed)
[facility] OFFICE PROGRESS NOTE  [name], MD [place] [address]  DIAGNOSIS: Stage IV non-small cell lung cancer, adenocarcinoma who presented with a large right lower lobe lung mass in addition to metastatic disease to the mediastinal lymph nodes, adrenal glands, and bone involving the right shoulder,lumbar spines, and pelvic bone.  Molecular Studies by [employer]:Showed no actionable mutations.  PRIOR THERAPY: Palliative radiation to the right neck and shoulder.He is scheduled to have [place] to the brain lesions on 09/01/2018.  CURRENT THERAPY: Palliative systemic chemotherapy with carboplatin for an AUC of 5, Alimta 400 mg/m, and Keytruda 200 mg IV every 3 weeks. First dose September 30, 2018. Status post 9cycles. Starting from cycle #5 the patient will be treated with maintenance Alimta and Keytruda.   INTERVAL HISTORY: Tony [name] 78 y.o. male returns to the clinic for a follow-up visit. At the  patient's last appointment, his chief complaint was fatigue.  His hemoglobin was found to be 7.1.  He received 2 units of pRBCs.  Since this time he states that his energy levels were initially improved; however, he has gradually been experiencing increased fatigue and dyspnea on exertion. Except for fatigue and chemotherapy induced anemia he continues to tolerate his treatment fairly well without any other adverse side effects.  He denies any fever, chills, night sweats, or weight loss.  He denies any chest pain, coughing or hemoptysis.  He denies any nausea, vomiting, diarrhea, or constipation. He denies any bleeding or bruising including melena, hematochezia, hematuria, or epistaxis. He denies any headache or visual changes.  He denies any rashes or skin changes.  He is here today for evaluation before starting cycle #10.  MEDICAL HISTORY: Past Medical History:  Diagnosis Date  . Anemia   . Brain metastases ([facility])   . CAD (coronary artery  disease)   . Constipation   . Gout   . Heart disease   . History of blood transfusion   . History of radiation therapy 09/01/2018   brain, cerebella (5 sites)/ 20 Gy in 1 fraction.  Marland Kitchen HLD (hyperlipidemia) 10/13/2018  . HTN (hypertension) 10/13/2018  . Hyperlipidemia   . Hypertension   . Lung cancer ([facility])   . Mass of lower lobe of right lung   . 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 Peripheral vascular disease ([facility])    Abdominal Aortic Aneurysm  . Prostate cancer [facility]) Nov. 2012  . Ulcer     ALLERGIES:  has No Known Allergies.  MEDICATIONS:  Current Outpatient Medications  Medication Sig Dispense Refill  . acetaminophen (TYLENOL) 650 MG CR tablet Take 650 mg by mouth daily as needed for pain.     Marland Kitchen aspirin 81 MG tablet Take 81 mg by mouth every evening.     . colchicine 0.6 MG tablet Take 0.6 mg by mouth daily.    . folic acid (FOLVITE) 604 MCG tablet Take 800 mcg by mouth daily.     Marland Kitchen gabapentin (NEURONTIN) 100 MG capsule TAKE 2 CAPSULES BY MOUTH AT BEDTIME 180 capsule 1  . metoprolol tartrate (LOPRESSOR) 50 MG tablet Take 50 mg by mouth 2 (two) times daily.    . Multiple Vitamins-Minerals (MULTIVITAMIN WITH MINERALS) tablet Take 1 tablet by mouth daily.    . niacin 500 MG tablet Take 500 mg by mouth 2 (two) times daily.    . Omega-3 Fatty Acids (FISH OIL) 1200 MG CAPS Take 1,200 mg by mouth daily.     Marland Kitchen  omeprazole (PRILOSEC OTC) 20 MG tablet Take 10 mg by mouth 2 (two) times a week.    . polyethylene glycol (MIRALAX / GLYCOLAX) packet Take 17 g by mouth 4 (four) times a week.     . pravastatin (PRAVACHOL) 10 MG tablet Take 10 mg by mouth every evening.     . Psyllium (METAMUCIL FIBER PO) Take 1 Dose by mouth 3 (three) times a week.     . ramipril (ALTACE) 10 MG capsule Take 10 mg by mouth every morning.     . triamterene-hydrochlorothiazide (MAXZIDE-25) 37.5-25 MG tablet Take 0.5 tablets by mouth every morning.     . mupirocin ointment (BACTROBAN) 2 % Place 1  application into the nose 3 (three) times daily. Place over area 3 times daily (Patient not taking: Reported on 12/01/2018) 30 g 0   No current facility-administered medications for this visit.    Facility-Administered Medications Ordered in Other Visits  Medication Dose Route Frequency Provider Last Rate Last Dose  . heparin lock flush 100 unit/mL  500 Units Intracatheter Once PRN Curt Bears, MD      . pembrolizumab [facility]) 200 mg in sodium chloride 0.9 % 50 mL chemo infusion  200 mg Intravenous Once Curt Bears, MD      . PEMEtrexed (ALIMTA) 800 mg in sodium chloride 0.9 % 100 mL chemo infusion  410 mg/m2 (Treatment Plan Recorded) Intravenous Once Curt Bears, MD      . sodium chloride flush (NS) 0.9 % injection 10 mL  10 mL Intracatheter PRN Curt Bears, MD        SURGICAL HISTORY:  Past Surgical History:  Procedure Laterality Date  . ANAL FISSURECTOMY    . COLONOSCOPY    . CORONARY ARTERY BYPASS GRAFT     2000  . EYE SURGERY Left Nov. 2013   Cataract  . EYE SURGERY Right Feb. 2014   Cataract  . HERNIA REPAIR    . PORTACATH PLACEMENT Left 09/24/2018   Procedure: INSERTION PORT-A-CATH;  Surgeon: Melrose Nakayama, MD;  Location: [facility];  Service: Thoracic;  Laterality: Left;  . PR VEIN BYPASS GRAFT,AORTO-FEM-POP  2000  . PROSTATE SURGERY  Nov. 2012  . ROBOT ASSISTED LAPAROSCOPIC RADICAL PROSTATECTOMY  08/08/2011   Procedure: ROBOTIC ASSISTED LAPAROSCOPIC RADICAL PROSTATECTOMY LEVEL 2;  Surgeon: Dutch Gray, MD;  Location: [facility];  Service: Urology;  Laterality: Bilateral;  with Bilateral Pelvic Lymphadenectomy   . SHOULDER SURGERY  1980'S  . VIDEO BRONCHOSCOPY WITH ENDOBRONCHIAL ULTRASOUND N/A 08/06/2018   Procedure: VIDEO BRONCHOSCOPY WITH ENDOBRONCHIAL ULTRASOUND;  Surgeon: Melrose Nakayama, MD;  Location: [facility];  Service: Thoracic;  Laterality: N/A;  . WISDOM TOOTH EXTRACTION      REVIEW OF SYSTEMS:   Review of Systems  Constitutional: Positive  for fatigue. Negative for appetite change, chills, fever and unexpected weight change.  HENT:   Negative for mouth sores, nosebleeds, sore throat and trouble swallowing.   Eyes: Negative for eye problems and icterus.  Respiratory: Positive dyspnea on exertion. Negative for cough, hemoptysis, and wheezing.   Cardiovascular: Negative for chest pain and leg swelling.  Gastrointestinal: Negative for abdominal pain, constipation, diarrhea, nausea and vomiting.  Genitourinary: Negative for bladder incontinence, difficulty urinating, dysuria, frequency and hematuria.   Musculoskeletal: Negative for back pain, gait problem, neck pain and neck stiffness.  Skin: Negative for itching and rash.  Neurological: Negative for dizziness, extremity weakness, gait problem, headaches, light-headedness and seizures.  Hematological: Negative for adenopathy. Does not bruise/bleed easily.  Psychiatric/Behavioral: Negative  for confusion, depression and sleep disturbance. The patient is not nervous/anxious.     PHYSICAL EXAMINATION:  Blood pressure 126/72, pulse 84, temperature 99.1 F (37.3 C), temperature source Oral, resp. rate 18, height 5\' 9"  (1.753 m), weight 166 lb (75.3 kg), SpO2 100 %.  ECOG PERFORMANCE STATUS: 1 - Symptomatic but completely ambulatory  Physical Exam  Constitutional: Oriented to person, place, and time and well-developed, well-nourished, and in no distress.  HENT:  Head: Normocephalic and atraumatic.  Mouth/Throat: Oropharynx is clear and moist. No oropharyngeal exudate.  Eyes: Conjunctivae are normal. Right eye exhibits no discharge. Left eye exhibits no discharge. No scleral icterus.  Neck: Normal range of motion. Neck supple.  Cardiovascular: Normal rate, regular rhythm, normal heart sounds and intact distal pulses.   Pulmonary/Chest: Mild wheezing. Effort normal and breath sounds normal. No respiratory distress. No rales.  Abdominal: Soft. Bowel sounds are normal. Exhibits no  distension and no mass. There is no tenderness.  Musculoskeletal: Normal range of motion. Exhibits no edema.  Lymphadenopathy:    No cervical adenopathy.  Neurological: Alert and oriented to person, place, and time. Exhibits normal muscle tone. Gait normal. Coordination normal.  Skin: Skin is warm and dry. No rash noted. Not diaphoretic. No erythema. No pallor.  Psychiatric: Mood, memory and judgment normal.  Vitals reviewed.  LABORATORY DATA: Lab Results  Component Value Date   WBC 5.9 04/06/2019   HGB 7.7 (L) 04/06/2019   HCT 24.2 (L) 04/06/2019   MCV 104.8 (H) 04/06/2019   PLT 214 04/06/2019      Chemistry      Component Value Date/Time   NA 137 04/06/2019 1005   NA 139 08/10/2012 1328   K 4.0 04/06/2019 1005   K 4.1 08/10/2012 1328   CL 103 04/06/2019 1005   CL 105 08/10/2012 1328   CO2 24 04/06/2019 1005   CO2 29 08/10/2012 1328   BUN 14 04/06/2019 1005   BUN 18.0 08/10/2012 1328   CREATININE 0.83 04/06/2019 1005   CREATININE 1.1 08/10/2012 1328      Component Value Date/Time   CALCIUM 8.7 (L) 04/06/2019 1005   CALCIUM 9.0 08/10/2012 1328   ALKPHOS 80 04/06/2019 1005   ALKPHOS 144 08/10/2012 1328   AST 28 04/06/2019 1005   AST 42 (H) 08/10/2012 1328   ALT 18 04/06/2019 1005   ALT 55 08/10/2012 1328   BILITOT 0.3 04/06/2019 1005   BILITOT 0.91 08/10/2012 1328       RADIOGRAPHIC STUDIES:  Ct Chest W Contrast  Result Date: 03/15/2019 CLINICAL DATA:  Restaging of non-small cell lung cancer EXAM: CT CHEST, ABDOMEN, AND PELVIS WITH CONTRAST TECHNIQUE: Multidetector CT imaging of the chest, abdomen and pelvis was performed following the standard protocol during bolus administration of intravenous contrast. CONTRAST:  121mL OMNIPAQUE IOHEXOL 300 MG/ML  SOLN COMPARISON:  None. FINDINGS: CT CHEST FINDINGS Cardiovascular: Left Port-A-Cath tip: SVC. Coronary, aortic arch, and branch vessel atherosclerotic vascular disease. Prior CABG. Mediastinum/Nodes: Right hilar node  1.1 cm in short axis on image 28/2, formerly 0.8 cm by my measurements. Stable mildly prominent right infrahilar lymph nodes. Lungs/Pleura: The right lower lobe mass medially measures 3.3 by 2.7 cm on image 37/2, previously 3.1 by 2.6 cm by my measurements. The lesion is about 5.8 cm vertically. Mild scattered scarring in the lungs. Interstitial accentuation at the lung bases, right greater than left. Airway plugging primarily in branches of the right lower lobe. Musculoskeletal: Mixed density but partially destructive lesion of the right glenoid  and adjacent scapular, similar to prior exam. CT ABDOMEN PELVIS FINDINGS Hepatobiliary: The complex segment 2 lesion measures 1.6 by 1.2 cm my measurements, previously the same. A 0.5 cm lesion in the dome of the right hepatic lobe on image 50/2 is probably a cyst although technically nonspecific due to small size. There is a stable hypodense lesion inferiorly in the right hepatic lobe measuring 0.9 by 0.6 cm on image 59/2. Several other tiny lesions are technically nonspecific. Mild gallbladder wall thickening although this may be partially due to contraction. Pancreas: Unremarkable Spleen: Unremarkable Adrenals/Urinary Tract: Both adrenal glands appear normal. Stable bilateral renal cysts. 2 mm right kidney upper pole nonobstructive renal calculus on image 91/5. Vascular calcifications in both renal hila. Stomach/Bowel: Sigmoid colon diverticulosis. Vascular/Lymphatic: Aortoiliac atherosclerotic vascular disease. Infrarenal abdominal aortic aneurysm 3.7 by 2.7 cm on image 79/2 with mural thrombus, overall stable size. 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 Occluded right superficial femoral artery but not the profunda branch. Reproductive: Prostatectomy Other: No supplemental non-categorized findings. Musculoskeletal: Primarily sclerotic metastatic lesion involving the spinous process and adjacent pedicles  as well as the right inferior half of the L1 vertebra, with stable endplate compression along the suspected metastatic lesion at L1 in the inferior endplate, not appreciably changed from 01/29/2019. 1.8 by 1.8 cm left iliac crest sclerotic lesion compatible with osseous metastatic disease, previously 1.7 cm in diameter, slightly increased sclerosis compared to the prior exam. IMPRESSION: 1. The right lower lobe mass is minimally larger than before and the right hilar lymph node is also mildly enlarged and increased from prior. 2. The osseous metastatic lesions in the right scapula, left iliac bone, and L1 vertebra appears similar to prior, perhaps minimally more sclerotic. 3. Similar small lesions in the liver, several of which remain nonspecific. 4. Infrarenal abdominal aortic aneurysm, 3.7 cm in diameter. If there is no intervening imaging follow up otherwise, recommend followup by [place] in 2 years. This recommendation follows ACR consensus guidelines: White Paper of the ACR Incidental Findings Committee II on Vascular Findings. J Am Coll Radiol 2013; 10:789-794. 5. Other imaging findings of potential clinical significance: Aortic Atherosclerosis (ICD10-I70.0). Coronary atherosclerosis. Prior CABG. Chronic interstitial accentuation especially favoring the lung bases and the periphery. Nonobstructive right nephrolithiasis. Sigmoid colon diverticulosis. Prostatectomy. Electronically Signed   By: Van Clines M.D.   On: 03/15/2019 15:09   Ct Abdomen Pelvis W Contrast  Result Date: 03/15/2019 CLINICAL DATA:  Restaging of non-small cell lung cancer EXAM: CT CHEST, ABDOMEN, AND PELVIS WITH CONTRAST TECHNIQUE: Multidetector CT imaging of the chest, abdomen and pelvis was performed following the standard protocol during bolus administration of intravenous contrast. CONTRAST:  173mL OMNIPAQUE IOHEXOL 300 MG/ML  SOLN COMPARISON:  None. FINDINGS: CT CHEST FINDINGS Cardiovascular: Left Port-A-Cath tip: SVC. Coronary,  aortic arch, and branch vessel atherosclerotic vascular disease. Prior CABG. Mediastinum/Nodes: Right hilar node 1.1 cm in short axis on image 28/2, formerly 0.8 cm by my measurements. Stable mildly prominent right infrahilar lymph nodes. Lungs/Pleura: The right lower lobe mass medially measures 3.3 by 2.7 cm on image 37/2, previously 3.1 by 2.6 cm by my measurements. The lesion is about 5.8 cm vertically. Mild scattered scarring in the lungs. Interstitial accentuation at the lung bases, right greater than left. Airway plugging primarily in branches of the right lower lobe. Musculoskeletal: Mixed density but partially destructive lesion of the right glenoid and adjacent scapular, similar to prior exam. CT ABDOMEN PELVIS FINDINGS Hepatobiliary: The  complex segment 2 lesion measures 1.6 by 1.2 cm my measurements, previously the same. A 0.5 cm lesion in the dome of the right hepatic lobe on image 50/2 is probably a cyst although technically nonspecific due to small size. There is a stable hypodense lesion inferiorly in the right hepatic lobe measuring 0.9 by 0.6 cm on image 59/2. Several other tiny lesions are technically nonspecific. Mild gallbladder wall thickening although this may be partially due to contraction. Pancreas: Unremarkable Spleen: Unremarkable Adrenals/Urinary Tract: Both adrenal glands appear normal. Stable bilateral renal cysts. 2 mm right kidney upper pole nonobstructive renal calculus on image 91/5. Vascular calcifications in both renal hila. Stomach/Bowel: Sigmoid colon diverticulosis. Vascular/Lymphatic: Aortoiliac atherosclerotic vascular disease. Infrarenal abdominal aortic aneurysm 3.7 by 2.7 cm on image 79/2 with mural thrombus, overall stable size. 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 Occluded right superficial femoral artery but not the profunda branch. Reproductive: Prostatectomy Other: No supplemental non-categorized  findings. Musculoskeletal: Primarily sclerotic metastatic lesion involving the spinous process and adjacent pedicles as well as the right inferior half of the L1 vertebra, with stable endplate compression along the suspected metastatic lesion at L1 in the inferior endplate, not appreciably changed from 01/29/2019. 1.8 by 1.8 cm left iliac crest sclerotic lesion compatible with osseous metastatic disease, previously 1.7 cm in diameter, slightly increased sclerosis compared to the prior exam. IMPRESSION: 1. The right lower lobe mass is minimally larger than before and the right hilar lymph node is also mildly enlarged and increased from prior. 2. The osseous metastatic lesions in the right scapula, left iliac bone, and L1 vertebra appears similar to prior, perhaps minimally more sclerotic. 3. Similar small lesions in the liver, several of which remain nonspecific. 4. Infrarenal abdominal aortic aneurysm, 3.7 cm in diameter. If there is no intervening imaging follow up otherwise, recommend followup by [place] in 2 years. This recommendation follows ACR consensus guidelines: White Paper of the ACR Incidental Findings Committee II on Vascular Findings. J Am Coll Radiol 2013; 10:789-794. 5. Other imaging findings of potential clinical significance: Aortic Atherosclerosis (ICD10-I70.0). Coronary atherosclerosis. Prior CABG. Chronic interstitial accentuation especially favoring the lung bases and the periphery. Nonobstructive right nephrolithiasis. Sigmoid colon diverticulosis. Prostatectomy. Electronically Signed   By: Van Clines M.D.   On: 03/15/2019 15:09     ASSESSMENT/PLAN:  This is a very pleasant 78 year old Caucasian male with stage IV non small cell lung cancer, adenocarcinoma. He presented with a large right lower lobe lung mass in addition to mediastinal lymphadenopathy. He also has metastatic disease to the adrenal gland, right shoulder, lumbar spine, and pelvic bones. He was diagnosed in November 2019.    The patient underwent palliative radiotherapy to the right shoulder and painful metastatic bone lesions.   He is currently undergoing systemic chemotherapy with carboplatin for an AUC of 5, Alimta 400 mg/m2, and Keytruda 200 mg IV every 3 weeks. He is status post 9 cycles. Starting from cycle #5, the patient will receive maintenance treatment with Keytruda and Alimta. Labs were reviewed his hemoglobin is at 7.7 today. I will arrange for the patient to receive 1 unit of blood later on this week. I recommend that he proceed with cycle #10 today as scheduled.   We will see him back for a follow up visit in 3 weeks for evaluation before starting cycle #11.   The patient was advised to call immediately if he has any concerning symptoms in the interval. The  patient voices understanding of current disease status and treatment options and is in agreement with the current care plan. All questions were answered. The patient knows to call the clinic with any problems, questions or concerns. We can certainly see the patient much sooner if necessary.  Orders Placed This Encounter  Procedures  . TSH    Standing Status:   Standing    Number of Occurrences:   10    Standing Expiration Date:   04/05/2020  . Practitioner attestation of consent    I, the ordering practitioner, attest that I have discussed with the patient the benefits, risks, side effects, alternatives, likelihood of achieving goals and potential problems during recovery for the procedure listed.    Standing Status:   Future    Standing Expiration Date:   04/05/2020    Order Specific Question:   Procedure    Answer:   Blood Product(s)  . Complete patient signature process for consent form    Standing Status:   Future    Standing Expiration Date:   04/05/2020  . Care order/instruction    Transfuse Parameters    Standing Status:   Future    Standing Expiration Date:   04/05/2020  . Type and screen    Standing Status:   Future    Number of  Occurrences:   1    Standing Expiration Date:   04/05/2020     Cassandra L Heilingoetter, PA-C 04/06/19

## 2019-04-06 NOTE — Patient Instructions (Signed)
[place] Discharge Instructions for Patients Receiving Chemotherapy  Today you received the following chemotherapy agents: Keytruda and Alimta   To help prevent nausea and vomiting after your treatment, we encourage you to take your nausea medication as directed.    If you develop nausea and vomiting that is not controlled by your nausea medication, call the clinic.   BELOW ARE SYMPTOMS THAT SHOULD BE REPORTED IMMEDIATELY:  *FEVER GREATER THAN 100.5 F  *CHILLS WITH OR WITHOUT FEVER  NAUSEA AND VOMITING THAT IS NOT CONTROLLED WITH YOUR NAUSEA MEDICATION  *UNUSUAL SHORTNESS OF BREATH  *UNUSUAL BRUISING OR BLEEDING  TENDERNESS IN MOUTH AND THROAT WITH OR WITHOUT PRESENCE OF ULCERS  *URINARY PROBLEMS  *BOWEL PROBLEMS  UNUSUAL RASH Items with * indicate a potential emergency and should be followed up as soon as possible.  Feel free to call the clinic should you have any questions or concerns. The clinic phone number is [phone].  Please show the [facility] at check-in to the Emergency Department and triage nurse.  Coronavirus (COVID-19) Are you at risk?  Are you at risk for the Coronavirus (COVID-19)?  To be considered HIGH RISK for Coronavirus (COVID-19), you have to meet the following criteria:  . Traveled to [place], [place], [place], [place] or [place]; or in the [place] to [place], [place], [place], or [place]; and have fever, cough, and shortness of breath within the last 2 weeks of travel OR . Been in close contact with a person diagnosed with COVID-19 within the last 2 weeks and have fever, cough, and shortness of breath . IF YOU DO NOT MEET THESE CRITERIA, YOU ARE CONSIDERED LOW RISK FOR COVID-19.  What to do if you are HIGH RISK for COVID-19?  Marland Kitchen If you are having a medical emergency, call 911. . Seek medical care right away. Before you go to a doctor's office, urgent care or emergency department, call ahead and tell them  about your recent travel, contact with someone diagnosed with COVID-19, and your symptoms. You should receive instructions from your physician's office regarding next steps of care.  . When you arrive at healthcare provider, tell the healthcare staff immediately you have returned from visiting [place], [place], [place], [place] or [place]; or traveled in the [place] to [place], [place], [place], or [place]; in the last two weeks or you have been in close contact with a person diagnosed with COVID-19 in the last 2 weeks.   . Tell the health care staff about your symptoms: fever, cough and shortness of breath. . After you have been seen by a medical provider, you will be either: o Tested for (COVID-19) and discharged home on quarantine except to seek medical care if symptoms worsen, and asked to  - Stay home and avoid contact with others until you get your results (4-5 days)  - Avoid travel on public transportation if possible (such as bus, train, or airplane) or o Sent to the Emergency Department by EMS for evaluation, COVID-19 testing, and possible admission depending on your condition and test results.  What to do if you are LOW RISK for COVID-19?  Reduce your risk of any infection by using the same precautions used for avoiding the common cold or flu:  Marland Kitchen Wash your hands often with soap and warm water for at least 20 seconds.  If soap and water are not readily available, use an alcohol-based hand sanitizer with at least 60% alcohol.  Marland Kitchen  If coughing or sneezing, cover your mouth and nose by coughing or sneezing into the elbow areas of your shirt or coat, into a tissue or into your sleeve (not your hands). . Avoid shaking hands with others and consider head nods or verbal greetings only. . Avoid touching your eyes, nose, or mouth with unwashed hands.  . Avoid close contact with people who are sick. . Avoid places or events with large numbers of people in one location, like concerts or  sporting events. . Carefully consider travel plans you have or are making. . If you are planning any travel outside or inside the [place], visit the CDC's Travelers' Health webpage for the latest health notices. . If you have some symptoms but not all symptoms, continue to monitor at home and seek medical attention if your symptoms worsen. . If you are having a medical emergency, call 911.   [place] / e-Visit: [email or web address]         [facility]: [facility]: [phone]                   [facility]: [phone]

## 2019-04-06 NOTE — Progress Notes (Signed)
Hg 7.7 reported to [employer] [place]. Received OK to treat. Pt will receive blood transfusion on Friday, 7/17, per PA.

## 2019-04-07 ENCOUNTER — Encounter (INDEPENDENT_AMBULATORY_CARE_PROVIDER_SITE_OTHER): Payer: Medicare Other | Admitting: Ophthalmology

## 2019-04-08 ENCOUNTER — Other Ambulatory Visit: Payer: Self-pay | Admitting: *Deleted

## 2019-04-08 DIAGNOSIS — D649 Anemia, unspecified: Secondary | ICD-10-CM

## 2019-04-08 LAB — PREPARE RBC (CROSSMATCH)

## 2019-04-09 ENCOUNTER — Inpatient Hospital Stay: Payer: Medicare Other

## 2019-04-09 ENCOUNTER — Other Ambulatory Visit: Payer: Self-pay

## 2019-04-09 DIAGNOSIS — D649 Anemia, unspecified: Secondary | ICD-10-CM

## 2019-04-09 DIAGNOSIS — Z5112 Encounter for antineoplastic immunotherapy: Secondary | ICD-10-CM | POA: Diagnosis not present

## 2019-04-09 MED ORDER — DIPHENHYDRAMINE HCL 25 MG PO CAPS
25.0000 mg | ORAL_CAPSULE | Freq: Once | ORAL | Status: AC
  Administered 2019-04-09: 25 mg via ORAL

## 2019-04-09 MED ORDER — SODIUM CHLORIDE 0.9% IV SOLUTION
250.0000 mL | Freq: Once | INTRAVENOUS | Status: AC
  Administered 2019-04-09: 250 mL via INTRAVENOUS
  Filled 2019-04-09: qty 250

## 2019-04-09 MED ORDER — DIPHENHYDRAMINE HCL 25 MG PO CAPS
ORAL_CAPSULE | ORAL | Status: AC
  Filled 2019-04-09: qty 1

## 2019-04-09 MED ORDER — ACETAMINOPHEN 325 MG PO TABS
650.0000 mg | ORAL_TABLET | Freq: Once | ORAL | Status: AC
  Administered 2019-04-09: 15:00:00 650 mg via ORAL

## 2019-04-09 MED ORDER — HEPARIN SOD (PORK) LOCK FLUSH 100 UNIT/ML IV SOLN
500.0000 [IU] | Freq: Every day | INTRAVENOUS | Status: AC | PRN
  Administered 2019-04-09: 500 [IU]
  Filled 2019-04-09: qty 5

## 2019-04-09 MED ORDER — ACETAMINOPHEN 325 MG PO TABS
ORAL_TABLET | ORAL | Status: AC
  Filled 2019-04-09: qty 2

## 2019-04-09 MED ORDER — SODIUM CHLORIDE 0.9% FLUSH
3.0000 mL | INTRAVENOUS | Status: DC | PRN
  Filled 2019-04-09: qty 10

## 2019-04-09 MED ORDER — SODIUM CHLORIDE 0.9% FLUSH
10.0000 mL | INTRAVENOUS | Status: AC | PRN
  Administered 2019-04-09: 17:00:00 10 mL
  Filled 2019-04-09: qty 10

## 2019-04-09 NOTE — Patient Instructions (Signed)
Blood Transfusion, Adult, Care After This sheet gives you information about how to care for yourself after your procedure. Your doctor may also give you more specific instructions. If you have problems or questions, contact your doctor. Follow these instructions at home:   Take over-the-counter and prescription medicines only as told by your doctor.  Go back to your normal activities as told by your doctor.  Follow instructions from your doctor about how to take care of the area where an IV tube was put into your vein (insertion site). Make sure you: ? Wash your hands with soap and water before you change your bandage (dressing). If there is no soap and water, use hand sanitizer. ? Change your bandage as told by your doctor.  Check your IV insertion site every day for signs of infection. Check for: ? More redness, swelling, or pain. ? More fluid or blood. ? Warmth. ? Pus or a bad smell. Contact a doctor if:  You have more redness, swelling, or pain around the IV insertion site.  You have more fluid or blood coming from the IV insertion site.  Your IV insertion site feels warm to the touch.  You have pus or a bad smell coming from the IV insertion site.  Your pee (urine) turns pink, red, or brown.  You feel weak after doing your normal activities. Get help right away if:  You have signs of a serious allergic or body defense (immune) system reaction, including: ? Itchiness. ? Hives. ? Trouble breathing. ? Anxiety. ? Pain in your chest or lower back. ? Fever, flushing, and chills. ? Fast pulse. ? Rash. ? Watery poop (diarrhea). ? Throwing up (vomiting). ? Dark pee. ? Serious headache. ? Dizziness. ? Stiff neck. ? Yellow color in your face or the white parts of your eyes (jaundice). Summary  After a blood transfusion, return to your normal activities as told by your doctor.  Every day, check for signs of infection where the IV tube was put into your vein.  Some  signs of infection are warm skin, more redness and pain, more fluid or blood, and pus or a bad smell where the needle went in.  Contact your doctor if you feel weak or have any unusual symptoms. This information is not intended to replace advice given to you by your health care provider. Make sure you discuss any questions you have with your health care provider. Document Released: 09/30/2014 Document Revised: 01/14/2018 Document Reviewed: 05/03/2016 Elsevier Patient Education  2020 [place] (COVID-19) Are you at risk?  Are you at risk for the Coronavirus (COVID-19)?  To be considered HIGH RISK for Coronavirus (COVID-19), you have to meet the following criteria:  . Traveled to [place], [place], [place], [place] or [place]; or in the [place] to [place], [place], [place], or [place]; and have fever, cough, and shortness of breath within the last 2 weeks of travel OR . Been in close contact with a person diagnosed with COVID-19 within the last 2 weeks and have fever, cough, and shortness of breath . IF YOU DO NOT MEET THESE CRITERIA, YOU ARE CONSIDERED LOW RISK FOR COVID-19.  What to do if you are HIGH RISK for COVID-19?  Marland Kitchen If you are having a medical emergency, call 911. . Seek medical care right away. Before you go to a doctor's office, urgent care or emergency department, call ahead and tell them about your recent travel, contact with someone diagnosed with COVID-19, and  your symptoms. You should receive instructions from your physician's office regarding next steps of care.  . When you arrive at healthcare provider, tell the healthcare staff immediately you have returned from visiting [place], [place], [place], [place] or [place]; or traveled in the [place] to [place], [place], [place], or [place]; in the last two weeks or you have been in close contact with a person diagnosed with COVID-19 in the last 2 weeks.   . Tell the health care staff about  your symptoms: fever, cough and shortness of breath. . After you have been seen by a medical provider, you will be either: o Tested for (COVID-19) and discharged home on quarantine except to seek medical care if symptoms worsen, and asked to  - Stay home and avoid contact with others until you get your results (4-5 days)  - Avoid travel on public transportation if possible (such as bus, train, or airplane) or o Sent to the Emergency Department by EMS for evaluation, COVID-19 testing, and possible admission depending on your condition and test results.  What to do if you are LOW RISK for COVID-19?  Reduce your risk of any infection by using the same precautions used for avoiding the common cold or flu:  Marland Kitchen Wash your hands often with soap and warm water for at least 20 seconds.  If soap and water are not readily available, use an alcohol-based hand sanitizer with at least 60% alcohol.  . If coughing or sneezing, cover your mouth and nose by coughing or sneezing into the elbow areas of your shirt or coat, into a tissue or into your sleeve (not your hands). . Avoid shaking hands with others and consider head nods or verbal greetings only. . Avoid touching your eyes, nose, or mouth with unwashed hands.  . Avoid close contact with people who are sick. . Avoid places or events with large numbers of people in one location, like concerts or sporting events. . Carefully consider travel plans you have or are making. . If you are planning any travel outside or inside the [place], visit the CDC's Travelers' Health webpage for the latest health notices. . If you have some symptoms but not all symptoms, continue to monitor at home and seek medical attention if your symptoms worsen. . If you are having a medical emergency, call 911.   [place] / e-Visit: [email or web address]         [facility]: [facility]: [phone]                   [facility]: [phone]

## 2019-04-10 LAB — BPAM RBC
Blood Product Expiration Date: 202008152359
ISSUE DATE / TIME: 202007171510
Unit Type and Rh: 5100

## 2019-04-10 LAB — TYPE AND SCREEN
ABO/RH(D): O POS
Antibody Screen: NEGATIVE
Unit division: 0

## 2019-04-20 ENCOUNTER — Other Ambulatory Visit: Payer: Self-pay | Admitting: Radiation Therapy

## 2019-04-20 DIAGNOSIS — C7949 Secondary malignant neoplasm of other parts of nervous system: Secondary | ICD-10-CM

## 2019-04-20 DIAGNOSIS — C7931 Secondary malignant neoplasm of brain: Secondary | ICD-10-CM

## 2019-04-21 ENCOUNTER — Other Ambulatory Visit: Payer: Self-pay | Admitting: Physician Assistant

## 2019-04-21 DIAGNOSIS — C3491 Malignant neoplasm of unspecified part of right bronchus or lung: Secondary | ICD-10-CM

## 2019-04-27 ENCOUNTER — Other Ambulatory Visit: Payer: Self-pay

## 2019-04-27 ENCOUNTER — Inpatient Hospital Stay: Payer: Medicare Other | Attending: Oncology

## 2019-04-27 ENCOUNTER — Inpatient Hospital Stay: Payer: Medicare Other

## 2019-04-27 ENCOUNTER — Inpatient Hospital Stay (HOSPITAL_BASED_OUTPATIENT_CLINIC_OR_DEPARTMENT_OTHER): Payer: Medicare Other | Admitting: Physician Assistant

## 2019-04-27 ENCOUNTER — Telehealth: Payer: Self-pay | Admitting: Internal Medicine

## 2019-04-27 ENCOUNTER — Encounter: Payer: Self-pay | Admitting: Physician Assistant

## 2019-04-27 ENCOUNTER — Other Ambulatory Visit: Payer: Self-pay | Admitting: Physician Assistant

## 2019-04-27 VITALS — BP 107/61 | HR 71 | Temp 98.5°F | Resp 17 | Ht 69.0 in | Wt 160.3 lb

## 2019-04-27 DIAGNOSIS — C3491 Malignant neoplasm of unspecified part of right bronchus or lung: Secondary | ICD-10-CM

## 2019-04-27 DIAGNOSIS — Z8546 Personal history of malignant neoplasm of prostate: Secondary | ICD-10-CM | POA: Diagnosis not present

## 2019-04-27 DIAGNOSIS — C7971 Secondary malignant neoplasm of right adrenal gland: Secondary | ICD-10-CM | POA: Diagnosis not present

## 2019-04-27 DIAGNOSIS — I2510 Atherosclerotic heart disease of native coronary artery without angina pectoris: Secondary | ICD-10-CM | POA: Insufficient documentation

## 2019-04-27 DIAGNOSIS — D649 Anemia, unspecified: Secondary | ICD-10-CM

## 2019-04-27 DIAGNOSIS — T451X5A Adverse effect of antineoplastic and immunosuppressive drugs, initial encounter: Secondary | ICD-10-CM

## 2019-04-27 DIAGNOSIS — Z79899 Other long term (current) drug therapy: Secondary | ICD-10-CM | POA: Diagnosis not present

## 2019-04-27 DIAGNOSIS — C771 Secondary and unspecified malignant neoplasm of intrathoracic lymph nodes: Secondary | ICD-10-CM | POA: Diagnosis not present

## 2019-04-27 DIAGNOSIS — Z5112 Encounter for antineoplastic immunotherapy: Secondary | ICD-10-CM

## 2019-04-27 DIAGNOSIS — D6481 Anemia due to antineoplastic chemotherapy: Secondary | ICD-10-CM | POA: Insufficient documentation

## 2019-04-27 DIAGNOSIS — R200 Anesthesia of skin: Secondary | ICD-10-CM

## 2019-04-27 DIAGNOSIS — G629 Polyneuropathy, unspecified: Secondary | ICD-10-CM | POA: Diagnosis not present

## 2019-04-27 DIAGNOSIS — R202 Paresthesia of skin: Secondary | ICD-10-CM

## 2019-04-27 DIAGNOSIS — Z5111 Encounter for antineoplastic chemotherapy: Secondary | ICD-10-CM

## 2019-04-27 DIAGNOSIS — C7951 Secondary malignant neoplasm of bone: Secondary | ICD-10-CM | POA: Insufficient documentation

## 2019-04-27 DIAGNOSIS — C7972 Secondary malignant neoplasm of left adrenal gland: Secondary | ICD-10-CM | POA: Insufficient documentation

## 2019-04-27 DIAGNOSIS — C7931 Secondary malignant neoplasm of brain: Secondary | ICD-10-CM | POA: Insufficient documentation

## 2019-04-27 DIAGNOSIS — C3431 Malignant neoplasm of lower lobe, right bronchus or lung: Secondary | ICD-10-CM | POA: Diagnosis present

## 2019-04-27 DIAGNOSIS — I10 Essential (primary) hypertension: Secondary | ICD-10-CM | POA: Diagnosis not present

## 2019-04-27 LAB — CBC WITH DIFFERENTIAL (CANCER CENTER ONLY)
Abs Immature Granulocytes: 0.04 10*3/uL (ref 0.00–0.07)
Basophils Absolute: 0 10*3/uL (ref 0.0–0.1)
Basophils Relative: 0 %
Eosinophils Absolute: 0.1 10*3/uL (ref 0.0–0.5)
Eosinophils Relative: 2 %
HCT: 23.8 % — ABNORMAL LOW (ref 39.0–52.0)
Hemoglobin: 7.6 g/dL — ABNORMAL LOW (ref 13.0–17.0)
Immature Granulocytes: 1 %
Lymphocytes Relative: 17 %
Lymphs Abs: 0.9 10*3/uL (ref 0.7–4.0)
MCH: 34.2 pg — ABNORMAL HIGH (ref 26.0–34.0)
MCHC: 31.9 g/dL (ref 30.0–36.0)
MCV: 107.2 fL — ABNORMAL HIGH (ref 80.0–100.0)
Monocytes Absolute: 0.8 10*3/uL (ref 0.1–1.0)
Monocytes Relative: 16 %
Neutro Abs: 3.2 10*3/uL (ref 1.7–7.7)
Neutrophils Relative %: 64 %
Platelet Count: 180 10*3/uL (ref 150–400)
RBC: 2.22 MIL/uL — ABNORMAL LOW (ref 4.22–5.81)
RDW: 22.8 % — ABNORMAL HIGH (ref 11.5–15.5)
WBC Count: 5 10*3/uL (ref 4.0–10.5)
nRBC: 0 % (ref 0.0–0.2)

## 2019-04-27 LAB — SAMPLE TO BLOOD BANK

## 2019-04-27 LAB — CMP (CANCER CENTER ONLY)
ALT: 38 U/L (ref 0–44)
AST: 30 U/L (ref 15–41)
Albumin: 2.4 g/dL — ABNORMAL LOW (ref 3.5–5.0)
Alkaline Phosphatase: 86 U/L (ref 38–126)
Anion gap: 10 (ref 5–15)
BUN: 17 mg/dL (ref 8–23)
CO2: 25 mmol/L (ref 22–32)
Calcium: 9 mg/dL (ref 8.9–10.3)
Chloride: 104 mmol/L (ref 98–111)
Creatinine: 0.94 mg/dL (ref 0.61–1.24)
GFR, Est AFR Am: >60 mL/min (ref >60)
GFR, Estimated: >60 mL/min (ref >60)
Glucose, Bld: 115 mg/dL — ABNORMAL HIGH (ref 70–99)
Potassium: 3.8 mmol/L (ref 3.5–5.1)
Sodium: 139 mmol/L (ref 135–145)
Total Bilirubin: 0.4 mg/dL (ref 0.3–1.2)
Total Protein: 7.1 g/dL (ref 6.5–8.1)

## 2019-04-27 LAB — TSH: TSH: 1.498 u[IU]/mL (ref 0.320–4.118)

## 2019-04-27 MED ORDER — HEPARIN SOD (PORK) LOCK FLUSH 100 UNIT/ML IV SOLN
500.0000 [IU] | Freq: Once | INTRAVENOUS | Status: AC | PRN
  Administered 2019-04-27: 14:00:00 500 [IU]
  Filled 2019-04-27: qty 5

## 2019-04-27 MED ORDER — SODIUM CHLORIDE 0.9 % IV SOLN
200.0000 mg | Freq: Once | INTRAVENOUS | Status: AC
  Administered 2019-04-27: 200 mg via INTRAVENOUS
  Filled 2019-04-27: qty 8

## 2019-04-27 MED ORDER — PROCHLORPERAZINE MALEATE 10 MG PO TABS
ORAL_TABLET | ORAL | Status: AC
  Filled 2019-04-27: qty 1

## 2019-04-27 MED ORDER — SODIUM CHLORIDE 0.9 % IV SOLN
410.0000 mg/m2 | Freq: Once | INTRAVENOUS | Status: AC
  Administered 2019-04-27: 800 mg via INTRAVENOUS
  Filled 2019-04-27: qty 20

## 2019-04-27 MED ORDER — SODIUM CHLORIDE 0.9% FLUSH
10.0000 mL | INTRAVENOUS | Status: DC | PRN
  Administered 2019-04-27: 14:00:00 10 mL
  Filled 2019-04-27: qty 10

## 2019-04-27 MED ORDER — SODIUM CHLORIDE 0.9 % IV SOLN
Freq: Once | INTRAVENOUS | Status: AC
  Administered 2019-04-27: 12:00:00 via INTRAVENOUS
  Filled 2019-04-27: qty 250

## 2019-04-27 MED ORDER — PROCHLORPERAZINE MALEATE 10 MG PO TABS
10.0000 mg | ORAL_TABLET | Freq: Once | ORAL | Status: AC
  Administered 2019-04-27: 12:00:00 10 mg via ORAL

## 2019-04-27 NOTE — Progress Notes (Signed)
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 Molecular Studies by [facility]:Showed no actionable mutations.  PRIOR THERAPY: Palliative radiation to the right neck and shoulder.He is scheduled to have [place] to the brain lesions on 09/01/2018.  CURRENT THERAPY: Palliative systemic chemotherapy with carboplatin for an AUC of 5, Alimta 400 mg/m, and Keytruda 200 mg IV every 3 weeks. First dose September 30, 2018. Status post9cycles. Starting from cycle #5 the patient will be treated with maintenance Alimta and Keytruda.  INTERVAL HISTORY: MACKAY HANAUER 78 y.o. male returns to the clinic for a follow-up visit.  The patient continues to endorse fatigue, generalized weakness, and decreased appetite since his last visit.  He tolerated his last treatment well without any adverse effects except for continued chemotherapy-induced anemia.  He is on a reduced dose of Alimta. He received a blood transfusion following his last appointment. Since receiving his blood transfusion, he states that his fatigue improved for about a day or so before it started gradually worsened again. The patient mentioned that he has considered hospice care but is concerned about what his wife's reaction would be and knows that she would be upset. He denies any fever, chills, or night sweats. He lost about 6 lbs last appointment secondary to not having appetite.  He denies any chest pain, cough or hemoptysis but endorses baseline shortness of breath with exertion.  He denies any nausea, vomiting, diarrhea, constipation.  He denies any headache or visual changes. He denies any rashes or skin changes.   The patient mentions that he continues to have peripheral neuropathy.  He takes two 100 mg tablets of gabapentin at nighttime which he believes helps improve his neuropathy. The patient is here today for evaluation before starting cycle #11.   MEDICAL HISTORY: Past Medical History:  Diagnosis Date  . Anemia   . Brain metastases ([facility])   . CAD (coronary artery disease)   . Constipation   . Gout   . Heart disease   . History of blood transfusion   . History of radiation therapy 09/01/2018   brain, cerebella (5 sites)/ 20 Gy in 1 fraction.  Marland Kitchen HLD (hyperlipidemia) 10/13/2018  . HTN (hypertension) 10/13/2018  . Hyperlipidemia   . Hypertension   . Lung cancer ([facility])   . Mass of lower lobe of right lung   . Myocardial infarction ([facility])    MI in 2000  . Peripheral vascular disease ([facility])    Abdominal Aortic Aneurysm  . Prostate cancer [facility]) Nov. 2012  . Ulcer     ALLERGIES:  has No Known Allergies.  MEDICATIONS:  Current Outpatient Medications  Medication Sig Dispense Refill  . acetaminophen (TYLENOL) 650 MG CR tablet Take 650 mg by mouth daily as needed for pain.     Marland Kitchen aspirin 81 MG tablet Take 81 mg by mouth every evening.     . colchicine 0.6 MG tablet Take 0.6 mg by mouth daily.    . folic acid (FOLVITE) 782 MCG tablet Take 800 mcg by mouth daily.     Marland Kitchen gabapentin (NEURONTIN) 100 MG capsule TAKE 2 CAPSULES BY MOUTH AT BEDTIME 180 capsule 1  . metoprolol tartrate (LOPRESSOR) 50 MG tablet Take 50 mg  by mouth 2 (two) times daily.    . Multiple Vitamins-Minerals (MULTIVITAMIN WITH MINERALS) tablet Take 1 tablet by mouth daily.    . mupirocin ointment (BACTROBAN) 2 % Place 1 application into the nose 3 (three) times daily. Place over area 3 times daily (Patient not taking: Reported on 12/01/2018) 30 g 0  . niacin 500 MG tablet Take 500 mg by mouth 2 (two) times daily.    . Omega-3 Fatty Acids (FISH OIL) 1200 MG CAPS Take 1,200 mg by mouth daily.     Marland Kitchen omeprazole (PRILOSEC OTC) 20 MG  tablet Take 10 mg by mouth 2 (two) times a week.    . polyethylene glycol (MIRALAX / GLYCOLAX) packet Take 17 g by mouth 4 (four) times a week.     . pravastatin (PRAVACHOL) 10 MG tablet Take 10 mg by mouth every evening.     . Psyllium (METAMUCIL FIBER PO) Take 1 Dose by mouth 3 (three) times a week.     . ramipril (ALTACE) 10 MG capsule Take 10 mg by mouth every morning.     . triamterene-hydrochlorothiazide (MAXZIDE-25) 37.5-25 MG tablet Take 0.5 tablets by mouth every morning.      No current facility-administered medications for this visit.     SURGICAL HISTORY:  Past Surgical History:  Procedure Laterality Date  . ANAL FISSURECTOMY    . COLONOSCOPY    . CORONARY ARTERY BYPASS GRAFT     2000  . EYE SURGERY Left Nov. 2013   Cataract  . EYE SURGERY Right Feb. 2014   Cataract  . HERNIA REPAIR    . PORTACATH PLACEMENT Left 09/24/2018   Procedure: INSERTION PORT-A-CATH;  Surgeon: Melrose Nakayama, MD;  Location: [facility];  Service: Thoracic;  Laterality: Left;  . PR VEIN BYPASS GRAFT,AORTO-FEM-POP  2000  . PROSTATE SURGERY  Nov. 2012  . ROBOT ASSISTED LAPAROSCOPIC RADICAL PROSTATECTOMY  08/08/2011   Procedure: ROBOTIC ASSISTED LAPAROSCOPIC RADICAL PROSTATECTOMY LEVEL 2;  Surgeon: Dutch Gray, MD;  Location: [facility];  Service: Urology;  Laterality: Bilateral;  with Bilateral Pelvic Lymphadenectomy   . SHOULDER SURGERY  1980'S  . VIDEO BRONCHOSCOPY WITH ENDOBRONCHIAL ULTRASOUND N/A 08/06/2018   Procedure: VIDEO BRONCHOSCOPY WITH ENDOBRONCHIAL ULTRASOUND;  Surgeon: Melrose Nakayama, MD;  Location: [facility];  Service: Thoracic;  Laterality: N/A;  . WISDOM TOOTH EXTRACTION      REVIEW OF SYSTEMS:   Review of Systems  Constitutional: Positive for fatigue, decreased appetite, and weight loss.  Negative for appetite change, chills, and fever  HENT: Negative for mouth sores, nosebleeds, sore throat and trouble swallowing.   Eyes: Negative for eye problems and icterus.  Respiratory:  Positive baseline shortness of breath with exertion. Negative for cough, hemoptysis, and wheezing.   Cardiovascular: Negative for chest pain and leg swelling.  Gastrointestinal: Negative for abdominal pain, constipation, diarrhea, nausea and vomiting.  Genitourinary: Negative for bladder incontinence, difficulty urinating, dysuria, frequency and hematuria.   Musculoskeletal: Negative for back pain, gait problem, neck pain and neck stiffness.  Skin: Negative for itching and rash.  Neurological: Negative for dizziness, extremity weakness, gait problem, headaches, light-headedness and seizures.  Hematological: Negative for adenopathy. Does not bruise/bleed easily.  Psychiatric/Behavioral: Negative for confusion, depression and sleep disturbance. The patient is not nervous/anxious.     PHYSICAL EXAMINATION:  Blood pressure 107/61, pulse 71, temperature 98.5 F (36.9 C), temperature source Oral, resp. rate 17, height 5\' 9"  (1.753 m), weight 160 lb 4.8 oz (72.7 kg), SpO2 96 %.  ECOG PERFORMANCE  STATUS: 1 - Symptomatic but completely ambulatory  Physical Exam  Constitutional: Oriented to person, place, and time and chronically ill appearing male and in no distress.  HENT:  Head: Normocephalic and atraumatic.  Mouth/Throat: Oropharynx is clear and moist. No oropharyngeal exudate.  Eyes: Conjunctivae are normal. Right eye exhibits no discharge. Left eye exhibits no discharge. No scleral icterus.  Neck: Normal range of motion. Neck supple.  Cardiovascular: Normal rate, regular rhythm, normal heart sounds and intact distal pulses.   Pulmonary/Chest: Effort normal and breath sounds normal. No respiratory distress. No wheezes. No rales.  Abdominal: Soft. Bowel sounds are normal. Exhibits no distension and no mass. There is no tenderness.  Musculoskeletal: Normal range of motion. Exhibits no edema.  Lymphadenopathy:    No cervical adenopathy.  Neurological: Alert and oriented to person, place, and  time. Exhibits normal muscle tone. Gait normal. Coordination normal.  Skin: Skin is warm and dry. No rash noted. Not diaphoretic. No erythema. No pallor.  Psychiatric: Mood, memory and judgment normal.  Vitals reviewed.  LABORATORY DATA: Lab Results  Component Value Date   WBC 5.0 04/27/2019   HGB 7.6 (L) 04/27/2019   HCT 23.8 (L) 04/27/2019   MCV 107.2 (H) 04/27/2019   PLT 180 04/27/2019      Chemistry      Component Value Date/Time   NA 139 04/27/2019 1002   NA 139 08/10/2012 1328   K 3.8 04/27/2019 1002   K 4.1 08/10/2012 1328   CL 104 04/27/2019 1002   CL 105 08/10/2012 1328   CO2 25 04/27/2019 1002   CO2 29 08/10/2012 1328   BUN 17 04/27/2019 1002   BUN 18.0 08/10/2012 1328   CREATININE 0.94 04/27/2019 1002   CREATININE 1.1 08/10/2012 1328      Component Value Date/Time   CALCIUM 9.0 04/27/2019 1002   CALCIUM 9.0 08/10/2012 1328   ALKPHOS 86 04/27/2019 1002   ALKPHOS 144 08/10/2012 1328   AST 30 04/27/2019 1002   AST 42 (H) 08/10/2012 1328   ALT 38 04/27/2019 1002   ALT 55 08/10/2012 1328   BILITOT 0.4 04/27/2019 1002   BILITOT 0.91 08/10/2012 1328       RADIOGRAPHIC STUDIES:  No results found.   ASSESSMENT/PLAN:  This is a very pleasant 78 year old Caucasian male with stage IV non small cell lung cancer, adenocarcinoma. He presented with a large right lower lobe lung mass in addition to mediastinal lymphadenopathy. He also has metastatic disease to the adrenal gland, right shoulder, lumbar spine, and pelvic bones. He was diagnosed in November 2019.   The patient underwent palliative radiotherapy to the right shoulder and painful metastatic bone lesions.   He is currently undergoing systemic chemotherapy with carboplatin for an AUC of 5, Alimta 400 mg/m2, and Keytruda 200 mg IV every 3 weeks. He is status post 10 cycles. Starting from cycle #5, the patient will receive maintenance treatment with Keytruda and Alimta.   The patient was seen with Dr.  Julien Nordmann today.  Dr. Julien Nordmann discussed the patient's current condition and treatment options including continuing with chemotherapy vs. A referral to hospice. After further discussion with the patient, he decided that he will wait for the results of the upcoming scan before making a decision.  He will receive cycle #11 scheduled.    I will arrange for the patient to obtain a restaging CT scan of the chest, abdomen, and pelvis prior to his next visit in 3 weeks.  The patient will return to  the clinic for a follow-up visit in 3 weeks for evaluation and to review his scan results.    Patient's hemoglobin continues to be a low today at 7.6.  I will arrange for the patient to receive 2 units of packed RBCs this week for his symptomatic anemia.  The patient was strongly encouraged to increase his calorie intake and to eat frequent meals. He was encouraged to continue to drink ensure.   For his peripheral neuropathy, I discussed with the patient that he may increase his gabapentin to three 100 mg tablets before bed if needed.  The patient was advised to call immediately if he has any concerning symptoms in the interval. The patient voices understanding of current disease status and treatment options and is in agreement with the current care plan. All questions were answered. The patient knows to call the clinic with any problems, questions or concerns. We can certainly see the patient much sooner if necessary   Orders Placed This Encounter  Procedures  . CT Chest W Contrast    Standing Status:   Future    Standing Expiration Date:   04/26/2020    Order Specific Question:   ** REASON FOR EXAM (FREE TEXT)    Answer:   restaging lung cancer    Order Specific Question:   If indicated for the ordered procedure, I authorize the administration of contrast media per Radiology protocol    Answer:   Yes    Order Specific Question:   Preferred imaging location?    Answer:   [facility]    Order  Specific Question:   Radiology Contrast Protocol - do NOT remove file path    Answer:   \\[email or web address].pdf  . CT Abdomen Pelvis W Contrast    Prefers the water based contrast. Wants to arrive early to drink. Please discuss instructions for this with him.    Standing Status:   Future    Standing Expiration Date:   04/26/2020    Order Specific Question:   ** REASON FOR EXAM (FREE TEXT)    Answer:   Restaging Lung Cancer    Order Specific Question:   If indicated for the ordered procedure, I authorize the administration of contrast media per Radiology protocol    Answer:   Yes    Order Specific Question:   Preferred imaging location?    Answer:   [facility]    Order Specific Question:   Is Oral Contrast requested for this exam?    Answer:   Yes, Per Radiology protocol    Order Specific Question:   Radiology Contrast Protocol - do NOT remove file path    Answer:   \\[email or web address]\[email or web address]\CTProtocols.pdf  . Practitioner attestation of consent    I, the ordering practitioner, attest that I have discussed with the patient the benefits, risks, side effects, alternatives, likelihood of achieving goals and potential problems during recovery for the procedure listed.    Standing Status:   Future    Standing Expiration Date:   04/26/2020    Order Specific Question:   Procedure    Answer:   Blood Product(s)  . Complete patient signature process for consent form    Standing Status:   Future    Standing Expiration Date:   04/26/2020  . Care order/instruction    Transfuse Parameters    Standing Status:   Future    Standing Expiration Date:   04/26/2020  . Type and screen    Standing Status:  Future    Standing Expiration Date:   04/26/2020     Tobe Sos Liza Czerwinski, PA-C 04/27/19   ADDENDUM: Hematology/Oncology Attending: I had a face-to-face encounter with the patient today.  I recommended his care plan.  This is a very pleasant 78 years old white male  with metastatic non-small cell lung cancer, adenocarcinoma with no actionable mutation.  He initially started on induction treatment with carboplatin, Alimta and Keytruda status post 4 cycles and he is currently undergoing maintenance treatment with Alimta and Keytruda status post 6 cycles. The patient continues to tolerate his treatment well except for fatigue secondary to chemotherapy-induced anemia. I recommended for the patient to proceed with cycle #7 of his maintenance treatment today as planned. I will see him back for follow-up visit in 3 weeks for evaluation with repeat CT scan of the chest, abdomen and pelvis for restaging of his disease. For the chemotherapy-induced anemia, we will arrange for the patient to receive 2 units of PRBCs transfusion. I discussed with the patient briefly his current condition and prognosis.  He is complaining of increasing fatigue and weakness with his treatment and considering the option of palliative care if the next scan showed disease progression.  I recommended for the patient to hold on this option until we see the scan results and then we can adjust his treatment. He was advised to call immediately if he has any other concerning symptoms in the interval.  Disclaimer: This note was dictated with voice recognition software. Similar sounding words can inadvertently be transcribed and may be missed upon review. Eilleen Kempf, MD 04/27/19

## 2019-04-27 NOTE — Telephone Encounter (Signed)
No 8/4 LOS

## 2019-04-27 NOTE — Progress Notes (Signed)
Verbal order from [place], [place]: okay to treat patient today. Will be getting blood transfusion sometime this week.

## 2019-04-27 NOTE — Patient Instructions (Signed)

## 2019-04-27 NOTE — Patient Instructions (Signed)
[facility] Discharge Instructions for Patients Receiving Chemotherapy  Today you received the following chemotherapy agents Pembrolizumab (KEYTRUDA) & Pemetrexed (ALIMTA).  To help prevent nausea and vomiting after your treatment, we encourage you to take your nausea medication as prescribed.   If you develop nausea and vomiting that is not controlled by your nausea medication, call the clinic.   BELOW ARE SYMPTOMS THAT SHOULD BE REPORTED IMMEDIATELY:  *FEVER GREATER THAN 100.5 F  *CHILLS WITH OR WITHOUT FEVER  NAUSEA AND VOMITING THAT IS NOT CONTROLLED WITH YOUR NAUSEA MEDICATION  *UNUSUAL SHORTNESS OF BREATH  *UNUSUAL BRUISING OR BLEEDING  TENDERNESS IN MOUTH AND THROAT WITH OR WITHOUT PRESENCE OF ULCERS  *URINARY PROBLEMS  *BOWEL PROBLEMS  UNUSUAL RASH Items with * indicate a potential emergency and should be followed up as soon as possible.  Feel free to call the clinic should you have any questions or concerns. The clinic phone number is [phone].  Please show the [facility] at check-in to the Emergency Department and triage nurse.  Coronavirus (COVID-19) Are you at risk?  Are you at risk for the Coronavirus (COVID-19)?  To be considered HIGH RISK for Coronavirus (COVID-19), you have to meet the following criteria:  . Traveled to [place], [place], [place], [place] or [place]; or in the [place] to [place], [place], [place], or [place]; and have fever, cough, and shortness of breath within the last 2 weeks of travel OR . Been in close contact with a person diagnosed with COVID-19 within the last 2 weeks and have fever, cough, and shortness of breath . IF YOU DO NOT MEET THESE CRITERIA, YOU ARE CONSIDERED LOW RISK FOR COVID-19.  What to do if you are HIGH RISK for COVID-19?  Marland Kitchen If you are having a medical emergency, call 911. . Seek medical care right away. Before you go to a doctor's office, urgent care or emergency department,  call ahead and tell them about your recent travel, contact with someone diagnosed with COVID-19, and your symptoms. You should receive instructions from your physician's office regarding next steps of care.  . When you arrive at healthcare provider, tell the healthcare staff immediately you have returned from visiting [place], [place], [place], [place] or [place]; or traveled in the [place] to [place], [place], [place], or [place]; in the last two weeks or you have been in close contact with a person diagnosed with COVID-19 in the last 2 weeks.   . Tell the health care staff about your symptoms: fever, cough and shortness of breath. . After you have been seen by a medical provider, you will be either: o Tested for (COVID-19) and discharged home on quarantine except to seek medical care if symptoms worsen, and asked to  - Stay home and avoid contact with others until you get your results (4-5 days)  - Avoid travel on public transportation if possible (such as bus, train, or airplane) or o Sent to the Emergency Department by EMS for evaluation, COVID-19 testing, and possible admission depending on your condition and test results.  What to do if you are LOW RISK for COVID-19?  Reduce your risk of any infection by using the same precautions used for avoiding the common cold or flu:  Marland Kitchen Wash your hands often with soap and warm water for at least 20 seconds.  If soap and water are not readily available, use an alcohol-based hand sanitizer with at least 60% alcohol.  Marland Kitchen  If coughing or sneezing, cover your mouth and nose by coughing or sneezing into the elbow areas of your shirt or coat, into a tissue or into your sleeve (not your hands). . Avoid shaking hands with others and consider head nods or verbal greetings only. . Avoid touching your eyes, nose, or mouth with unwashed hands.  . Avoid close contact with people who are sick. . Avoid places or events with large numbers of people in one  location, like concerts or sporting events. . Carefully consider travel plans you have or are making. . If you are planning any travel outside or inside the [place], visit the CDC's Travelers' Health webpage for the latest health notices. . If you have some symptoms but not all symptoms, continue to monitor at home and seek medical attention if your symptoms worsen. . If you are having a medical emergency, call 911.   [facility] / e-Visit: [email or web address]         [facility]: [facility]: [phone]                   [facility]: [phone]

## 2019-04-28 ENCOUNTER — Inpatient Hospital Stay: Payer: Medicare Other

## 2019-04-28 ENCOUNTER — Other Ambulatory Visit: Payer: Self-pay

## 2019-04-28 VITALS — BP 116/56 | HR 57 | Temp 97.8°F | Resp 18

## 2019-04-28 DIAGNOSIS — R7989 Other specified abnormal findings of blood chemistry: Secondary | ICD-10-CM

## 2019-04-28 DIAGNOSIS — Z5112 Encounter for antineoplastic immunotherapy: Secondary | ICD-10-CM | POA: Diagnosis not present

## 2019-04-28 DIAGNOSIS — C3491 Malignant neoplasm of unspecified part of right bronchus or lung: Secondary | ICD-10-CM

## 2019-04-28 LAB — PREPARE RBC (CROSSMATCH)

## 2019-04-28 MED ORDER — ACETAMINOPHEN 325 MG PO TABS
650.0000 mg | ORAL_TABLET | Freq: Once | ORAL | Status: AC
  Administered 2019-04-28: 650 mg via ORAL

## 2019-04-28 MED ORDER — ACETAMINOPHEN 325 MG PO TABS
ORAL_TABLET | ORAL | Status: AC
  Filled 2019-04-28: qty 2

## 2019-04-28 MED ORDER — HEPARIN SOD (PORK) LOCK FLUSH 100 UNIT/ML IV SOLN
500.0000 [IU] | Freq: Every day | INTRAVENOUS | Status: AC | PRN
  Administered 2019-04-28: 500 [IU]
  Filled 2019-04-28: qty 5

## 2019-04-28 MED ORDER — SODIUM CHLORIDE 0.9 % IV SOLN
INTRAVENOUS | Status: DC
  Administered 2019-04-28: 16:00:00 via INTRAVENOUS
  Filled 2019-04-28: qty 250

## 2019-04-28 MED ORDER — SODIUM CHLORIDE 0.9% IV SOLUTION
250.0000 mL | Freq: Once | INTRAVENOUS | Status: AC
  Administered 2019-04-28: 250 mL via INTRAVENOUS
  Filled 2019-04-28: qty 250

## 2019-04-28 MED ORDER — DIPHENHYDRAMINE HCL 25 MG PO CAPS
ORAL_CAPSULE | ORAL | Status: AC
  Filled 2019-04-28: qty 1

## 2019-04-28 MED ORDER — DIPHENHYDRAMINE HCL 25 MG PO CAPS
25.0000 mg | ORAL_CAPSULE | Freq: Once | ORAL | Status: AC
  Administered 2019-04-28: 25 mg via ORAL

## 2019-04-28 MED ORDER — SODIUM CHLORIDE 0.9% FLUSH
10.0000 mL | INTRAVENOUS | Status: AC | PRN
  Administered 2019-04-28: 10 mL
  Filled 2019-04-28: qty 10

## 2019-04-28 NOTE — Patient Instructions (Signed)
Blood Transfusion, Adult, Care After This sheet gives you information about how to care for yourself after your procedure. Your doctor may also give you more specific instructions. If you have problems or questions, contact your doctor. Follow these instructions at home:   Take over-the-counter and prescription medicines only as told by your doctor.  Go back to your normal activities as told by your doctor.  Follow instructions from your doctor about how to take care of the area where an IV tube was put into your vein (insertion site). Make sure you: ? Wash your hands with soap and water before you change your bandage (dressing). If there is no soap and water, use hand sanitizer. ? Change your bandage as told by your doctor.  Check your IV insertion site every day for signs of infection. Check for: ? More redness, swelling, or pain. ? More fluid or blood. ? Warmth. ? Pus or a bad smell. Contact a doctor if:  You have more redness, swelling, or pain around the IV insertion site.  You have more fluid or blood coming from the IV insertion site.  Your IV insertion site feels warm to the touch.  You have pus or a bad smell coming from the IV insertion site.  Your pee (urine) turns pink, red, or brown.  You feel weak after doing your normal activities. Get help right away if:  You have signs of a serious allergic or body defense (immune) system reaction, including: ? Itchiness. ? Hives. ? Trouble breathing. ? Anxiety. ? Pain in your chest or lower back. ? Fever, flushing, and chills. ? Fast pulse. ? Rash. ? Watery poop (diarrhea). ? Throwing up (vomiting). ? Dark pee. ? Serious headache. ? Dizziness. ? Stiff neck. ? Yellow color in your face or the white parts of your eyes (jaundice). Summary  After a blood transfusion, return to your normal activities as told by your doctor.  Every day, check for signs of infection where the IV tube was put into your vein.  Some  signs of infection are warm skin, more redness and pain, more fluid or blood, and pus or a bad smell where the needle went in.  Contact your doctor if you feel weak or have any unusual symptoms. This information is not intended to replace advice given to you by your health care provider. Make sure you discuss any questions you have with your health care provider. Document Released: 09/30/2014 Document Revised: 01/14/2018 Document Reviewed: 05/03/2016 Elsevier Patient Education  2020 [employer] (COVID-19) Are you at risk?  Are you at risk for the Coronavirus (COVID-19)?  To be considered HIGH RISK for Coronavirus (COVID-19), you have to meet the following criteria:  . Traveled to [place], [place], [place], [place] or [place]; or in the [place] to [place], [place], [place], or [place]; and have fever, cough, and shortness of breath within the last 2 weeks of travel OR . Been in close contact with a person diagnosed with COVID-19 within the last 2 weeks and have fever, cough, and shortness of breath . IF YOU DO NOT MEET THESE CRITERIA, YOU ARE CONSIDERED LOW RISK FOR COVID-19.  What to do if you are HIGH RISK for COVID-19?  Marland Kitchen If you are having a medical emergency, call 911. . Seek medical care right away. Before you go to a doctor's office, urgent care or emergency department, call ahead and tell them about your recent travel, contact with someone diagnosed with COVID-19, and  your symptoms. You should receive instructions from your physician's office regarding next steps of care.  . When you arrive at healthcare provider, tell the healthcare staff immediately you have returned from visiting [place], [place], [place], [place] or [place]; or traveled in the [place] to [place], [place], [place], or [place]; in the last two weeks or you have been in close contact with a person diagnosed with COVID-19 in the last 2 weeks.   . Tell the health care staff about  your symptoms: fever, cough and shortness of breath. . After you have been seen by a medical provider, you will be either: o Tested for (COVID-19) and discharged home on quarantine except to seek medical care if symptoms worsen, and asked to  - Stay home and avoid contact with others until you get your results (4-5 days)  - Avoid travel on public transportation if possible (such as bus, train, or airplane) or o Sent to the Emergency Department by EMS for evaluation, COVID-19 testing, and possible admission depending on your condition and test results.  What to do if you are LOW RISK for COVID-19?  Reduce your risk of any infection by using the same precautions used for avoiding the common cold or flu:  Marland Kitchen Wash your hands often with soap and warm water for at least 20 seconds.  If soap and water are not readily available, use an alcohol-based hand sanitizer with at least 60% alcohol.  . If coughing or sneezing, cover your mouth and nose by coughing or sneezing into the elbow areas of your shirt or coat, into a tissue or into your sleeve (not your hands). . Avoid shaking hands with others and consider head nods or verbal greetings only. . Avoid touching your eyes, nose, or mouth with unwashed hands.  . Avoid close contact with people who are sick. . Avoid places or events with large numbers of people in one location, like concerts or sporting events. . Carefully consider travel plans you have or are making. . If you are planning any travel outside or inside the [place], visit the CDC's Travelers' Health webpage for the latest health notices. . If you have some symptoms but not all symptoms, continue to monitor at home and seek medical attention if your symptoms worsen. . If you are having a medical emergency, call 911.   [place] / e-Visit: [email or web address]         [facility]: [facility]: [phone]                   [facility]: [phone]

## 2019-04-29 LAB — TYPE AND SCREEN
ABO/RH(D): O POS
Antibody Screen: NEGATIVE
Unit division: 0
Unit division: 0

## 2019-04-29 LAB — BPAM RBC
Blood Product Expiration Date: 202008302359
Blood Product Expiration Date: 202008302359
ISSUE DATE / TIME: 202008051318
ISSUE DATE / TIME: 202008051318
Unit Type and Rh: 5100
Unit Type and Rh: 5100

## 2019-04-30 ENCOUNTER — Other Ambulatory Visit: Payer: Self-pay | Admitting: Physician Assistant

## 2019-04-30 DIAGNOSIS — D6481 Anemia due to antineoplastic chemotherapy: Secondary | ICD-10-CM

## 2019-05-14 ENCOUNTER — Encounter (HOSPITAL_COMMUNITY): Payer: Self-pay

## 2019-05-14 ENCOUNTER — Other Ambulatory Visit: Payer: Self-pay

## 2019-05-14 ENCOUNTER — Ambulatory Visit (HOSPITAL_COMMUNITY)
Admission: RE | Admit: 2019-05-14 | Discharge: 2019-05-14 | Disposition: A | Discharge status: Home / Self Care | Payer: Medicare Other | Source: Ambulatory Visit | Attending: Physician Assistant | Admitting: Physician Assistant

## 2019-05-14 DIAGNOSIS — C3491 Malignant neoplasm of unspecified part of right bronchus or lung: Secondary | ICD-10-CM | POA: Diagnosis not present

## 2019-05-14 HISTORY — DX: Secondary malignant neoplasm of bone: C79.51

## 2019-05-14 MED ORDER — SODIUM CHLORIDE (PF) 0.9 % IJ SOLN
INTRAMUSCULAR | Status: AC
  Filled 2019-05-14: qty 50

## 2019-05-14 MED ORDER — IOHEXOL 300 MG/ML  SOLN
30.0000 mL | Freq: Once | INTRAMUSCULAR | Status: AC | PRN
  Administered 2019-05-14: 30 mL via ORAL

## 2019-05-14 MED ORDER — IOHEXOL 300 MG/ML  SOLN
100.0000 mL | Freq: Once | INTRAMUSCULAR | Status: AC | PRN
  Administered 2019-05-14: 100 mL via INTRAVENOUS

## 2019-05-18 ENCOUNTER — Inpatient Hospital Stay: Payer: Medicare Other

## 2019-05-18 ENCOUNTER — Other Ambulatory Visit: Payer: Self-pay

## 2019-05-18 ENCOUNTER — Telehealth: Payer: Self-pay | Admitting: Internal Medicine

## 2019-05-18 ENCOUNTER — Inpatient Hospital Stay (HOSPITAL_BASED_OUTPATIENT_CLINIC_OR_DEPARTMENT_OTHER): Payer: Medicare Other | Admitting: Internal Medicine

## 2019-05-18 ENCOUNTER — Encounter: Payer: Self-pay | Admitting: Internal Medicine

## 2019-05-18 VITALS — BP 110/48 | HR 72 | Temp 99.1°F | Resp 16 | Ht 69.0 in | Wt 161.5 lb

## 2019-05-18 DIAGNOSIS — C3491 Malignant neoplasm of unspecified part of right bronchus or lung: Secondary | ICD-10-CM

## 2019-05-18 DIAGNOSIS — D6481 Anemia due to antineoplastic chemotherapy: Secondary | ICD-10-CM

## 2019-05-18 DIAGNOSIS — C3490 Malignant neoplasm of unspecified part of unspecified bronchus or lung: Secondary | ICD-10-CM

## 2019-05-18 DIAGNOSIS — C7931 Secondary malignant neoplasm of brain: Secondary | ICD-10-CM

## 2019-05-18 DIAGNOSIS — T451X5A Adverse effect of antineoplastic and immunosuppressive drugs, initial encounter: Secondary | ICD-10-CM

## 2019-05-18 DIAGNOSIS — D649 Anemia, unspecified: Secondary | ICD-10-CM

## 2019-05-18 DIAGNOSIS — Z5112 Encounter for antineoplastic immunotherapy: Secondary | ICD-10-CM

## 2019-05-18 DIAGNOSIS — Z5111 Encounter for antineoplastic chemotherapy: Secondary | ICD-10-CM

## 2019-05-18 DIAGNOSIS — Z95828 Presence of other vascular implants and grafts: Secondary | ICD-10-CM

## 2019-05-18 LAB — CBC WITH DIFFERENTIAL (CANCER CENTER ONLY)
Abs Immature Granulocytes: 0.05 10*3/uL (ref 0.00–0.07)
Basophils Absolute: 0 10*3/uL (ref 0.0–0.1)
Basophils Relative: 0 %
Eosinophils Absolute: 0.1 10*3/uL (ref 0.0–0.5)
Eosinophils Relative: 2 %
HCT: 25.3 % — ABNORMAL LOW (ref 39.0–52.0)
Hemoglobin: 8 g/dL — ABNORMAL LOW (ref 13.0–17.0)
Immature Granulocytes: 1 %
Lymphocytes Relative: 15 %
Lymphs Abs: 0.9 10*3/uL (ref 0.7–4.0)
MCH: 33.8 pg (ref 26.0–34.0)
MCHC: 31.6 g/dL (ref 30.0–36.0)
MCV: 106.8 fL — ABNORMAL HIGH (ref 80.0–100.0)
Monocytes Absolute: 0.9 10*3/uL (ref 0.1–1.0)
Monocytes Relative: 16 %
Neutro Abs: 3.7 10*3/uL (ref 1.7–7.7)
Neutrophils Relative %: 66 %
Platelet Count: 175 10*3/uL (ref 150–400)
RBC: 2.37 MIL/uL — ABNORMAL LOW (ref 4.22–5.81)
RDW: 25.2 % — ABNORMAL HIGH (ref 11.5–15.5)
WBC Count: 5.6 10*3/uL (ref 4.0–10.5)
nRBC: 0 % (ref 0.0–0.2)

## 2019-05-18 LAB — CMP (CANCER CENTER ONLY)
ALT: 57 U/L — ABNORMAL HIGH (ref 0–44)
AST: 37 U/L (ref 15–41)
Albumin: 2.4 g/dL — ABNORMAL LOW (ref 3.5–5.0)
Alkaline Phosphatase: 94 U/L (ref 38–126)
Anion gap: 7 (ref 5–15)
BUN: 15 mg/dL (ref 8–23)
CO2: 25 mmol/L (ref 22–32)
Calcium: 8.4 mg/dL — ABNORMAL LOW (ref 8.9–10.3)
Chloride: 104 mmol/L (ref 98–111)
Creatinine: 0.92 mg/dL (ref 0.61–1.24)
GFR, Est AFR Am: >60 mL/min (ref >60)
GFR, Estimated: >60 mL/min (ref >60)
Glucose, Bld: 127 mg/dL — ABNORMAL HIGH (ref 70–99)
Potassium: 4.2 mmol/L (ref 3.5–5.1)
Sodium: 136 mmol/L (ref 135–145)
Total Bilirubin: 0.4 mg/dL (ref 0.3–1.2)
Total Protein: 6.7 g/dL (ref 6.5–8.1)

## 2019-05-18 LAB — TSH: TSH: 1.516 u[IU]/mL (ref 0.320–4.118)

## 2019-05-18 LAB — SAMPLE TO BLOOD BANK

## 2019-05-18 MED ORDER — PREDNISONE 10 MG PO TABS: ORAL_TABLET | ORAL | 0 refills | Status: DC

## 2019-05-18 MED ORDER — SODIUM CHLORIDE 0.9% FLUSH
10.0000 mL | Freq: Once | INTRAVENOUS | Status: AC
  Administered 2019-05-18: 10 mL
  Filled 2019-05-18: qty 10

## 2019-05-18 NOTE — Progress Notes (Signed)
[facility] Telephone:([phone]   [phone]  OFFICE PROGRESS NOTE  [name], MD [place] [address]  DIAGNOSIS:Stage IV non-small cell lung cancer, adenocarcinoma who presented with a large right lower lobe lung mass in addition to metastatic disease to the mediastinal lymph nodes, adrenal glands, and bone involving the right shoulder,lumbar spines, and pelvic bone.  Molecular Studies by [facility]: Showed no actionable mutations.  PRIOR THERAPY:1) palliative radiation to the right neck and shoulder.  He is scheduled to have [place] to the brain lesions on 09/01/2018.  CURRENT THERAPY: 1) palliative systemic chemotherapy with carboplatin for an AUC of 5, Alimta 400 mg/m, and Keytruda 200 mg IV every 3 weeks.  First dose September 30, 2018.  Status post 11 cycles.  Starting from cycle #5 the patient will be treated with maintenance Alimta and Keytruda.  INTERVAL HISTORY: Tony [name] 78 y.o. male returns to the clinic today for follow-up visit.  His wife was available by phone during the visit.  The patient is feeling fine today with no concerning complaints except for the persistent fatigue and shortness of breath with exertion.  The patient has no chest pain, cough or hemoptysis.  He denied having any fever or chills.  He has no nausea, vomiting, diarrhea or constipation.  He has a rough time with the treatment.  He had repeat CT scan of the chest, abdomen and pelvis performed recently and he is here for evaluation and discussion of his scan results.  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   HLD  (hyperlipidemia) 10/13/2018   HTN (hypertension) 10/13/2018   Hyperlipidemia    Hypertension    Lung cancer ([place]) dx'd 07/2018   Mass of lower lobe of right lung    Myocardial infarction [facility])    MI in 2000   Peripheral vascular disease ([place])    Abdominal Aortic Aneurysm   Prostate cancer ([place]) Nov. 2012   Ulcer     ALLERGIES:  has No Known Allergies.  MEDICATIONS:  Current Outpatient Medications  Medication Sig Dispense Refill   aspirin 81 MG tablet Take 81 mg by mouth every evening.      folic acid (FOLVITE) 865 MCG tablet Take 800 mcg by mouth daily.      gabapentin (NEURONTIN) 100 MG capsule TAKE 2 CAPSULES BY MOUTH AT BEDTIME 180 capsule 1   metoprolol tartrate (LOPRESSOR) 50 MG tablet Take 50 mg by mouth 2 (two) times daily.     Multiple Vitamins-Minerals (MULTIVITAMIN WITH MINERALS) tablet Take 1 tablet by mouth daily.     niacin 500 MG tablet Take 500 mg by mouth 2 (two) times daily.     Omega-3 Fatty Acids (FISH OIL) 1200 MG CAPS Take 1,200 mg by mouth daily.      omeprazole (PRILOSEC OTC) 20 MG tablet Take 10 mg by mouth 2 (two) times a week.     polyethylene glycol (MIRALAX / GLYCOLAX) packet Take 17 g by mouth 4 (four) times a week.      pravastatin (PRAVACHOL) 10 MG tablet Take  10 mg by mouth every evening.      ramipril (ALTACE) 10 MG capsule Take 10 mg by mouth every morning.      triamterene-hydrochlorothiazide (MAXZIDE-25) 37.5-25 MG tablet Take 0.5 tablets by mouth every morning.      acetaminophen (TYLENOL) 650 MG CR tablet Take 650 mg by mouth daily as needed for pain.      colchicine 0.6 MG tablet Take 0.6 mg by mouth daily.     mupirocin ointment (BACTROBAN) 2 % Place 1 application into the nose 3 (three) times daily. Place over area 3 times daily (Patient not taking: Reported on 12/01/2018) 30 g 0   Psyllium (METAMUCIL FIBER PO) Take 1 Dose by mouth 3 (three) times a week.      No current facility-administered medications for this  visit.     SURGICAL HISTORY:  Past Surgical History:  Procedure Laterality Date   ANAL FISSURECTOMY     COLONOSCOPY     CORONARY ARTERY BYPASS GRAFT     2000   EYE SURGERY Left Nov. 2013   Cataract   EYE SURGERY Right Feb. 2014   Cataract   HERNIA REPAIR     PORTACATH PLACEMENT Left 09/24/2018   Procedure: INSERTION PORT-A-CATH;  Surgeon: Melrose Nakayama, MD;  Location: [facility] OR;  Service: Thoracic;  Laterality: Left;   PR VEIN BYPASS GRAFT,AORTO-FEM-POP  2000   PROSTATE SURGERY  Nov. 2012   ROBOT ASSISTED LAPAROSCOPIC RADICAL PROSTATECTOMY  08/08/2011   Procedure: ROBOTIC ASSISTED LAPAROSCOPIC RADICAL PROSTATECTOMY LEVEL 2;  Surgeon: Dutch Gray, MD;  Location: [facility] ORS;  Service: Urology;  Laterality: Bilateral;  with Bilateral Pelvic Lymphadenectomy    SHOULDER SURGERY  1980'S   VIDEO BRONCHOSCOPY WITH ENDOBRONCHIAL ULTRASOUND N/A 08/06/2018   Procedure: VIDEO BRONCHOSCOPY WITH ENDOBRONCHIAL ULTRASOUND;  Surgeon: Melrose Nakayama, MD;  Location: [facility];  Service: Thoracic;  Laterality: N/A;   WISDOM TOOTH EXTRACTION      REVIEW OF SYSTEMS:  Constitutional: positive for fatigue Eyes: negative Ears, nose, mouth, throat, and face: negative Respiratory: positive for dyspnea on exertion Cardiovascular: negative Gastrointestinal: negative Genitourinary:negative Integument/breast: negative Hematologic/lymphatic: negative Musculoskeletal:positive for muscle weakness Neurological: negative Behavioral/Psych: negative Endocrine: negative Allergic/Immunologic: negative   PHYSICAL EXAMINATION: General appearance: alert, cooperative, fatigued and no distress Head: Normocephalic, without obvious abnormality, atraumatic Neck: no adenopathy, no JVD, supple, symmetrical, trachea midline and thyroid not enlarged, symmetric, no tenderness/mass/nodules Lymph nodes: Cervical, supraclavicular, and axillary nodes normal. Resp: clear to auscultation bilaterally Back:  symmetric, no curvature. ROM normal. No CVA tenderness. Cardio: regular rate and rhythm, S1, S2 normal, no murmur, click, rub or gallop GI: soft, non-tender; bowel sounds normal; no masses,  no organomegaly Extremities: extremities normal, atraumatic, no cyanosis or edema Neurologic: Alert and oriented X 3, normal strength and tone. Normal symmetric reflexes. Normal coordination and gait  ECOG PERFORMANCE STATUS: 1 - Symptomatic but completely ambulatory  Blood pressure (!) 110/48, pulse 72, temperature 99.1 F (37.3 C), temperature source Oral, resp. rate 16, height 5\' 9"  (1.753 m), weight 161 lb 8 oz (73.3 kg), SpO2 97 %.  LABORATORY DATA: Lab Results  Component Value Date   WBC 5.6 05/18/2019   HGB 8.0 (L) 05/18/2019   HCT 25.3 (L) 05/18/2019   MCV 106.8 (H) 05/18/2019   PLT 175 05/18/2019      Chemistry      Component Value Date/Time   NA 139 04/27/2019 1002   NA 139 08/10/2012 1328   K 3.8 04/27/2019 1002   K 4.1 08/10/2012  1328   CL 104 04/27/2019 1002   CL 105 08/10/2012 1328   CO2 25 04/27/2019 1002   CO2 29 08/10/2012 1328   BUN 17 04/27/2019 1002   BUN 18.0 08/10/2012 1328   CREATININE 0.94 04/27/2019 1002   CREATININE 1.1 08/10/2012 1328      Component Value Date/Time   CALCIUM 9.0 04/27/2019 1002   CALCIUM 9.0 08/10/2012 1328   ALKPHOS 86 04/27/2019 1002   ALKPHOS 144 08/10/2012 1328   AST 30 04/27/2019 1002   AST 42 (H) 08/10/2012 1328   ALT 38 04/27/2019 1002   ALT 55 08/10/2012 1328   BILITOT 0.4 04/27/2019 1002   BILITOT 0.91 08/10/2012 1328       RADIOGRAPHIC STUDIES: Ct Chest W Contrast  Result Date: 05/14/2019 CLINICAL DATA:  Lung cancer diagnosed in 2019. Chemotherapy in progress. Radiation therapy complete. Prostate cancer diagnosed 8 years ago. Fatigue. Shortness of breath on exertion. Occasional constipation. EXAM: CT CHEST, ABDOMEN, AND PELVIS WITH CONTRAST TECHNIQUE: Multidetector CT imaging of the chest, abdomen and pelvis was performed  following the standard protocol during bolus administration of intravenous contrast. CONTRAST:  172mL OMNIPAQUE IOHEXOL 300 MG/ML  SOLN COMPARISON:  03/15/2019 FINDINGS: CT CHEST FINDINGS Cardiovascular: Left Port-A-Cath tip at mid SVC. Aortic and branch vessel atherosclerosis. Tortuous thoracic aorta. Mild cardiomegaly, without pericardial effusion. Median sternotomy for CABG. No central pulmonary embolism, on this non-dedicated study. Pulmonary artery enlargement, with a 2.9 cm right pulmonary artery. Mediastinum/Nodes: No supraclavicular adenopathy. No mediastinal adenopathy. Decreased size of a right hilar node which is not pathologic sized at 8 mm on 26/2. Compare 1.1 cm on the prior. Lungs/Pleura: Trace bilateral pleural fluid, new. Central right lower lobe lung lesion is most well-circumscribed and apparent on image 34/2. 2.5 x 2.3 cm versus 2.3 x 2.2 cm on the prior exam (when remeasured). Significant progression of peripheral and basilar predominant interstitial thickening with some areas of relative diffuse interstitial prominence, most apparent in the right lower lobe. Possible developing underlying pulmonary nodules, difficult to differentiate from the diffuse interstitial thickening. For example, apparent nodule along the right major fissure measures 1.0 cm on image 85/4. Possible satellite nodule in the right lower lobe more inferiorly at 1.1 cm on image 99/4. Subpleural left upper lobe pulmonary nodule at 8 mm on 40/4. Musculoskeletal: T12 vertebral hemangioma. Sclerotic right glenoid metastasis is similar. CT ABDOMEN PELVIS FINDINGS Hepatobiliary: Too small to characterize liver lesions are similar in size and distribution. A lateral segment left liver lobe 1.2 cm hypoattenuating lesion is indeterminate but measures similar to on the prior exam (when remeasured). Normal gallbladder, without biliary ductal dilatation. Pancreas: Normal, without mass or ductal dilatation. Spleen: Normal in size, without  focal abnormality. Adrenals/Urinary Tract: Normal adrenal glands. Mild renal cortical thinning bilaterally. Bilateral renal too small to characterize lesions and cysts. No hydronephrosis. Decompressed urinary bladder. Stomach/Bowel: Normal stomach, without wall thickening. Colonic stool burden suggests constipation. Normal terminal ileum. Normal small bowel. Vascular/Lymphatic: Advanced aortic and branch vessel atherosclerosis. Infrarenal abdominal aortic ectasia at up to 3.3 cm, similar. Extensive wall thrombus within. 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 No abdominopelvic adenopathy. Reproductive: Prostatectomy.  No locally recurrent disease. Other: No significant free fluid. Musculoskeletal: Left iliac sclerotic lesion of 1.8 cm, similar. Redemonstration of sclerotic metastasis involving the right side of the L1 vertebral body and spinous process. IMPRESSION: 1. Mildly increased size of right lower lobe lung lesion. Resolution of right hilar adenopathy. 2. Significantly worsened lung aeration. Progression of somewhat  basilar and peripheral predominant but relatively diffuse interstitial thickening. Findings are suspicious for lymphangitic tumor spread. Drug toxicity could look similar but is felt less likely. 3. Suspicion of developing pulmonary nodularity which could represent metastasis. Primarily obscured by the underlying diffuse interstitial prominence. 4. Similar osseous metastasis. 5. Similar appearance of the liver, with too small to characterize and nonspecific lesions as detailed above. 6. New small bilateral pleural effusions. 7. Prostatectomy. Electronically Signed   By: Abigail Miyamoto M.D.   On: 05/14/2019 16:43   Ct Abdomen Pelvis W Contrast  Result Date: 05/14/2019 CLINICAL DATA:  Lung cancer diagnosed in 2019. Chemotherapy in progress. Radiation therapy complete. Prostate cancer diagnosed 8 years ago. Fatigue. Shortness of breath on exertion. Occasional constipation.  EXAM: CT CHEST, ABDOMEN, AND PELVIS WITH CONTRAST TECHNIQUE: Multidetector CT imaging of the chest, abdomen and pelvis was performed following the standard protocol during bolus administration of intravenous contrast. CONTRAST:  161mL OMNIPAQUE IOHEXOL 300 MG/ML  SOLN COMPARISON:  03/15/2019 FINDINGS: CT CHEST FINDINGS Cardiovascular: Left Port-A-Cath tip at mid SVC. Aortic and branch vessel atherosclerosis. Tortuous thoracic aorta. Mild cardiomegaly, without pericardial effusion. Median sternotomy for CABG. No central pulmonary embolism, on this non-dedicated study. Pulmonary artery enlargement, with a 2.9 cm right pulmonary artery. Mediastinum/Nodes: No supraclavicular adenopathy. No mediastinal adenopathy. Decreased size of a right hilar node which is not pathologic sized at 8 mm on 26/2. Compare 1.1 cm on the prior. Lungs/Pleura: Trace bilateral pleural fluid, new. Central right lower lobe lung lesion is most well-circumscribed and apparent on image 34/2. 2.5 x 2.3 cm versus 2.3 x 2.2 cm on the prior exam (when remeasured). Significant progression of peripheral and basilar predominant interstitial thickening with some areas of relative diffuse interstitial prominence, most apparent in the right lower lobe. Possible developing underlying pulmonary nodules, difficult to differentiate from the diffuse interstitial thickening. For example, apparent nodule along the right major fissure measures 1.0 cm on image 85/4. Possible satellite nodule in the right lower lobe more inferiorly at 1.1 cm on image 99/4. Subpleural left upper lobe pulmonary nodule at 8 mm on 40/4. Musculoskeletal: T12 vertebral hemangioma. Sclerotic right glenoid metastasis is similar. CT ABDOMEN PELVIS FINDINGS Hepatobiliary: Too small to characterize liver lesions are similar in size and distribution. A lateral segment left liver lobe 1.2 cm hypoattenuating lesion is indeterminate but measures similar to on the prior exam (when remeasured). Normal  gallbladder, without biliary ductal dilatation. Pancreas: Normal, without mass or ductal dilatation. Spleen: Normal in size, without focal abnormality. Adrenals/Urinary Tract: Normal adrenal glands. Mild renal cortical thinning bilaterally. Bilateral renal too small to characterize lesions and cysts. No hydronephrosis. Decompressed urinary bladder. Stomach/Bowel: Normal stomach, without wall thickening. Colonic stool burden suggests constipation. Normal terminal ileum. Normal small bowel. Vascular/Lymphatic: Advanced aortic and branch vessel atherosclerosis. Infrarenal abdominal aortic ectasia at up to 3.3 cm, similar. Extensive wall thrombus within. 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 No abdominopelvic adenopathy. Reproductive: Prostatectomy.  No locally recurrent disease. Other: No significant free fluid. Musculoskeletal: Left iliac sclerotic lesion of 1.8 cm, similar. Redemonstration of sclerotic metastasis involving the right side of the L1 vertebral body and spinous process. IMPRESSION: 1. Mildly increased size of right lower lobe lung lesion. Resolution of right hilar adenopathy. 2. Significantly worsened lung aeration. Progression of somewhat basilar and peripheral predominant but relatively diffuse interstitial thickening. Findings are suspicious for lymphangitic tumor spread. Drug toxicity could look similar but is felt less likely. 3. Suspicion of developing pulmonary nodularity which could represent metastasis.  Primarily obscured by the underlying diffuse interstitial prominence. 4. Similar osseous metastasis. 5. Similar appearance of the liver, with too small to characterize and nonspecific lesions as detailed above. 6. New small bilateral pleural effusions. 7. Prostatectomy. Electronically Signed   By: Abigail Miyamoto M.D.   On: 05/14/2019 16:43    ASSESSMENT AND PLAN: This is a very pleasant 78 years old white male with a stage IV non-small cell lung cancer, adenocarcinoma  presented with large right lower lobe lung mass in addition to mediastinal lymphadenopathy, metastatic disease to the adrenal glands as well as several metastatic bone lesions including the right shoulder, lumbar spine and pelvic bones.  The patient has no actionable mutations. He underwent palliative radiotherapy to the right shoulder and painful metastatic bone lesions. He is feeling much better after the radiotherapy. He is currently undergoing systemic chemotherapy tomorrow with carboplatin for AUC of 5, Alimta 500 mg/M2 and Keytruda 200 mg IV every 3 weeks.  Status post 4 cycles. He is currently on maintenance treatment with Alimta and Keytruda status post 7 cycles.   The patient had repeat CT scan of the chest, abdomen and pelvis performed recently.  I personally and independently reviewed the scan images and discussed the results with the patient today. His scan showed no concerning findings for disease progression except for mild increase of the right lower lobe lung nodule but there was improvement of the right hilar adenopathy.  The patient also has worsening airspace disease in the lung suspicious for inflammatory process secondary to immunotherapy versus lymphangitic spread. The patient has a rough time with his chemotherapy with increasing fatigue and weakness. I recommended for him to take a break of treatment for the next 2 months and during this time I will start the patient on a tapering dose of prednisone. I will see him back for follow-up visit in 2 months for evaluation with repeat CT scan of the chest, abdomen and pelvis for restaging of his disease.  The patient and his wife agreed to the current plan. 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 For the chemotherapy-induced anemia, we will continue to monitor him closely and consider the patient for transfusion if needed. He was advised to call immediately if he has  any concerning symptoms in the interval. The patient voices understanding of current disease status and treatment options and is in agreement with the current care plan. All questions were answered. The patient knows to call the clinic with any problems, questions or concerns. We can certainly see the patient much sooner if necessary.   Disclaimer: This note was dictated with voice recognition software. Similar sounding words can inadvertently be transcribed and may not be corrected upon review.

## 2019-05-18 NOTE — Telephone Encounter (Signed)
Scheduled appt per 8/25 los.  Printed calendar and avs.

## 2019-05-19 ENCOUNTER — Telehealth: Payer: Self-pay | Admitting: Interventional Cardiology

## 2019-05-19 NOTE — Progress Notes (Signed)
Cardiology Office Note   Date:  05/21/2019   ID:  Tony [name], Tony [name] Sep 07, 1941, MRN [id number]  PCP:  Tony Ruff, MD    No chief complaint on file.  CAD  Wt Readings from Last 3 Encounters:  05/21/19 160 lb 6.4 oz (72.8 kg)  05/18/19 161 lb 8 oz (73.3 kg)  04/27/19 160 lb 4.8 oz (72.7 kg)       History of Present Illness: Tony [name] is a 78 y.o. male who is being seen today for the evaluation of CAD at the request of Tony Ruff, MD.  He has a history of hypertension, hyperlipidemia, myocardial infarction in 2000, peripheral vascular disease with abdominal aortic aneurysm along with coronary artery disease.  In 2000, he had a PTCA.  A month later, he had CABG x 4.  No angiogram since that time.    According to the records from Dr. 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  The patient has no actionable mutations. He underwent palliative radiotherapy to the right shoulder and painful metastatic bone lesions. He is feeling much better after the radiotherapy. He is currently undergoing systemic chemotherapy tomorrow with carboplatin for AUC of 5, Alimta 500 mg/M2 and Keytruda 200 mg IV every 3 weeks.  Status post 4 cycles. He is currently on maintenance treatment with Alimta and Keytruda status post 7 cycles. "  Denies : Chest pain. Dizziness. Leg edema. Nitroglycerin use. Orthopnea. Palpitations. Paroxysmal nocturnal dyspnea. Shortness of breath. Syncope.   Walking is limited by chemo related fatigue.        Past Medical History:  Diagnosis Date  . Anemia   . Brain metastases ([place])   . CAD (coronary artery disease)   . 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 Constipation    . Gout   . Heart disease   . History of blood transfusion   . History of radiation therapy 09/01/2018   brain, cerebella (5 sites)/ 20 Gy in 1 fraction.  Marland Kitchen HLD (hyperlipidemia) 10/13/2018  . HTN (hypertension) 10/13/2018  . Hyperlipidemia   . Hypertension   . Lung cancer ([place]) dx'd 07/2018  . Mass of lower lobe of right lung   . Myocardial infarction ([place])    MI in 2000  . Peripheral vascular disease ([facility])    Abdominal Aortic Aneurysm  . Prostate cancer [facility]) Nov. 2012  . Ulcer     Past Surgical History:  Procedure Laterality Date  . ANAL FISSURECTOMY    . COLONOSCOPY    . CORONARY ARTERY BYPASS GRAFT     2000  . EYE SURGERY Left Nov. 2013   Cataract  . EYE SURGERY Right Feb. 2014   Cataract  . HERNIA REPAIR    . PORTACATH PLACEMENT Left 09/24/2018   Procedure: INSERTION PORT-A-CATH;  Surgeon: Tony Nakayama, MD;  Location: [facility];  Service: Thoracic;  Laterality: Left;  . PR VEIN BYPASS GRAFT,AORTO-FEM-POP  2000  . PROSTATE SURGERY  Nov. 2012  . ROBOT ASSISTED LAPAROSCOPIC RADICAL PROSTATECTOMY  08/08/2011   Procedure: ROBOTIC ASSISTED LAPAROSCOPIC RADICAL PROSTATECTOMY LEVEL 2;  Surgeon: Tony Gray, MD;  Location: [facility];  Service: Urology;  Laterality: Bilateral;  with Bilateral Pelvic Lymphadenectomy   . SHOULDER SURGERY  1980'S  .  VIDEO BRONCHOSCOPY WITH ENDOBRONCHIAL ULTRASOUND N/A 08/06/2018   Procedure: VIDEO BRONCHOSCOPY WITH ENDOBRONCHIAL ULTRASOUND;  Surgeon: Tony Nakayama, MD;  Location: [facility];  Service: Thoracic;  Laterality: N/A;  . WISDOM TOOTH EXTRACTION       Current Outpatient Medications  Medication Sig Dispense Refill  . acetaminophen (TYLENOL) 650 MG CR tablet Take 650 mg by mouth daily as needed for pain.     Marland Kitchen aspirin 81 MG tablet Take 81 mg by mouth every evening.     . colchicine 0.6 MG tablet Take 0.6 mg by mouth daily.    . folic acid (FOLVITE) 762 MCG tablet Take 800 mcg by mouth daily.     Marland Kitchen gabapentin (NEURONTIN) 100 MG capsule  TAKE 2 CAPSULES BY MOUTH AT BEDTIME 180 capsule 1  . metoprolol tartrate (LOPRESSOR) 50 MG tablet Take 50 mg by mouth 2 (two) times daily.    . Multiple Vitamins-Minerals (MULTIVITAMIN WITH MINERALS) tablet Take 1 tablet by mouth daily.    . niacin 500 MG tablet Take 500 mg by mouth 2 (two) times daily.    . Omega-3 Fatty Acids (FISH OIL) 1200 MG CAPS Take 1,200 mg by mouth daily.     Marland Kitchen omeprazole (PRILOSEC OTC) 20 MG tablet Take 10 mg by mouth 2 (two) times a week.    . polyethylene glycol (MIRALAX / GLYCOLAX) packet Take 17 g by mouth daily.     . pravastatin (PRAVACHOL) 10 MG tablet Take 10 mg by mouth every evening.     . predniSONE (DELTASONE) 10 MG tablet 7 tablet p.o. daily for 1 week, 5 tablet p.o. daily for 1 week, followed by 3 tablet p.o. daily for 1 week followed by 2 tablets p.o. daily for 1 week followed by 1 tablet p.o. daily for 1 week and then stop. 130 tablet 0  . ramipril (ALTACE) 10 MG capsule Take 10 mg by mouth every morning.     . triamterene-hydrochlorothiazide (MAXZIDE-25) 37.5-25 MG tablet Take 0.5 tablets by mouth every morning.      No current facility-administered medications for this visit.     Allergies:   Patient has no known allergies.    Social History:  The patient  reports that he quit smoking about 20 years ago. His smoking use included cigarettes. He has a 64.50 pack-year smoking history. He has never used smokeless tobacco. He reports current alcohol use of about 7.0 standard drinks of alcohol per week. He reports that he does not use drugs.   Family History:  The patient's family history includes Deep vein thrombosis in his father; Diabetes in his mother and sister; Heart disease in his brother, father, mother, and sister; Heart failure in his brother, father, and sister; Hyperlipidemia in his brother; Hypertension in his brother.    ROS:  Please see the history of present illness.   Otherwise, review of systems are positive for fatigue.   All other  systems are reviewed and negative.    PHYSICAL EXAM: VS:  BP 96/60   Pulse 69   Ht 5\' 9"  (1.753 m)   Wt 160 lb 6.4 oz (72.8 kg)   SpO2 97%   BMI 23.69 kg/m  , BMI Body mass index is 23.69 kg/m. GEN: Well nourished, well developed, in no acute distress  HEENT: normal  Neck: no JVD, carotid bruits, or masses Cardiac: RRR; no murmurs, rubs, or gallops,no edema  Respiratory:  clear to auscultation bilaterally, normal work of breathing GI: soft, nontender, nondistended, + BS MS:  no deformity or atrophy  Skin: warm and dry, no rash Neuro:  Strength and sensation are intact Psych: euthymic mood, full affect   EKG:   The ekg ordered today demonstrates NSR, PACs   Recent Labs: 05/18/2019: ALT 57; BUN 15; Creatinine 0.92; Hemoglobin 8.0; Platelet Count 175; Potassium 4.2; Sodium 136; TSH 1.516   Lipid Panel No results found for: CHOL, TRIG, HDL, CHOLHDL, VLDL, LDLCALC, LDLDIRECT   Other studies Reviewed: Additional studies/ records that were reviewed today with results demonstrating: 2019 u/s: "There is evidence of abnormal dilitation of the Mid Abdominal aorta. The largest aortic measurement is 3.5 cm. The largest aortic diameter has decreased compared to prior exam. Previous diameter measurement was 3.9 cm obtained on 08/19/2017."   ASSESSMENT AND PLAN:  1. CAD/old MI: No angina.  Continue aggressive secondary prevention.  2. Lung CA: Managed by Dr. Earlie [name]. 3. AAA: stable.  FOllowed by annual u/s. 4. HTN: Low BP.  Decrease metoprolol to 25 mg BID.  If BP drops or he has dizziness, would decrease Altace 10 mg to 5 mg daily. 5. Anemia:  Chemo related.  Now off of chemo.    Current medicines are reviewed at length with the patient today.  The patient concerns regarding his medicines were addressed.  The following changes have been made:  Decrease metoprolol to 25 mg BID  Labs/ tests ordered today include:  No orders of the defined types were placed in this encounter.    Recommend 150 minutes/week of aerobic exercise Low fat, low carb, high fiber diet recommended  Disposition:   FU in 1 year, or sooner if problems arise   Signed, Larae Grooms, MD  05/21/2019 11:16 AM    [facility] [address], [place], [place]  [address] Phone: [phone]; Fax: [phone]

## 2019-05-19 NOTE — Telephone Encounter (Signed)
Returned call to patient. Made patient aware that we will help get her husband upstairs and that a friend is not needed as we are limiting visitors d/t COVID.

## 2019-05-19 NOTE — Telephone Encounter (Signed)
New Message   Patient's wife is calling in to inform us that she will have a friend coming to assist with getting husband into the wheel chair and wheeling him upstairs. Patient's wife also states that she has a extreme fear of riding elevators so the friend will be coming upstairs with them as well. Both the patient and patient's wife have an appointment on Friday 05/21/19 with Dr. Irish Lack (one at 11 am and the other at 11:20 am) Please give patient's wife a call back to advise.

## 2019-05-21 ENCOUNTER — Encounter: Payer: Self-pay | Admitting: Interventional Cardiology

## 2019-05-21 ENCOUNTER — Ambulatory Visit (INDEPENDENT_AMBULATORY_CARE_PROVIDER_SITE_OTHER): Payer: Medicare Other | Admitting: Interventional Cardiology

## 2019-05-21 ENCOUNTER — Other Ambulatory Visit: Payer: Self-pay

## 2019-05-21 VITALS — BP 96/60 | HR 69 | Ht 69.0 in | Wt 160.4 lb

## 2019-05-21 DIAGNOSIS — I25118 Atherosclerotic heart disease of native coronary artery with other forms of angina pectoris: Secondary | ICD-10-CM | POA: Diagnosis not present

## 2019-05-21 DIAGNOSIS — I252 Old myocardial infarction: Secondary | ICD-10-CM

## 2019-05-21 DIAGNOSIS — I714 Abdominal aortic aneurysm, without rupture, unspecified: Secondary | ICD-10-CM

## 2019-05-21 DIAGNOSIS — I10 Essential (primary) hypertension: Secondary | ICD-10-CM

## 2019-05-21 MED ORDER — METOPROLOL TARTRATE 25 MG PO TABS: 25.0000 mg | ORAL_TABLET | Freq: Two times a day (BID) | ORAL | 3 refills | Status: DC

## 2019-05-21 NOTE — Patient Instructions (Signed)
Medication Instructions: . Your physician has recommended you make the following change in your medication:  DECREASE METOPROLOL TO 25 MG TWICE DAILY  If you need a refill on your cardiac medications before your next appointment, please call your pharmacy.   Lab work: NONE If you have labs (blood work) drawn today and your tests are completely normal, you will receive your results only by: Marland Kitchen MyChart Message (if you have MyChart) OR . A paper copy in the mail If you have any lab test that is abnormal or we need to change your treatment, we will call you to review the results.  Testing/Procedures: NONE  Follow-Up: At [facility], you and your health needs are our priority.  As part of our continuing mission to provide you with exceptional heart care, we have created designated Provider Care Teams.  These Care Teams include your primary Cardiologist (physician) and Advanced Practice Providers (APPs -  Physician Assistants and Nurse Practitioners) who all work together to provide you with the care you need, when you need it. You will need a follow up appointment in 12 months.  Please call our office 2 months in advance to schedule this appointment.  You may see Dr. Irish Lack or one of the following Advanced Practice Providers on your designated Care Team:   [place], PA-C Melina Copa, PA-C . Ermalinda Barrios, PA-C  Any Other Special Instructions Will Be Listed Below (If Applicable).  CONTINUE TO MONITOR YOUR BLOOD PRESSURES, LET us KNOW IF ANY LOW BLOOD PRESSURES OR DIZZINESS OCCUR

## 2019-06-10 ENCOUNTER — Other Ambulatory Visit: Payer: Self-pay

## 2019-06-10 ENCOUNTER — Other Ambulatory Visit: Payer: Self-pay | Admitting: Radiation Therapy

## 2019-06-10 ENCOUNTER — Ambulatory Visit (HOSPITAL_COMMUNITY)
Admission: RE | Admit: 2019-06-10 | Discharge: 2019-06-10 | Disposition: A | Discharge status: Home / Self Care | Payer: Medicare Other | Source: Ambulatory Visit | Attending: Radiation Oncology | Admitting: Radiation Oncology

## 2019-06-10 DIAGNOSIS — C7931 Secondary malignant neoplasm of brain: Secondary | ICD-10-CM | POA: Diagnosis present

## 2019-06-10 DIAGNOSIS — C7949 Secondary malignant neoplasm of other parts of nervous system: Secondary | ICD-10-CM | POA: Insufficient documentation

## 2019-06-10 MED ORDER — GADOBUTROL 1 MMOL/ML IV SOLN
7.0000 mL | Freq: Once | INTRAVENOUS | Status: AC | PRN
  Administered 2019-06-10: 7 mL via INTRAVENOUS

## 2019-07-16 ENCOUNTER — Ambulatory Visit (HOSPITAL_COMMUNITY)
Admission: RE | Admit: 2019-07-16 | Discharge: 2019-07-16 | Disposition: A | Discharge status: Home / Self Care | Payer: Medicare Other | Source: Ambulatory Visit | Attending: Internal Medicine | Admitting: Internal Medicine

## 2019-07-16 ENCOUNTER — Inpatient Hospital Stay: Payer: Medicare Other | Attending: Oncology

## 2019-07-16 ENCOUNTER — Other Ambulatory Visit: Payer: Self-pay

## 2019-07-16 ENCOUNTER — Encounter (HOSPITAL_COMMUNITY): Payer: Self-pay

## 2019-07-16 DIAGNOSIS — C771 Secondary and unspecified malignant neoplasm of intrathoracic lymph nodes: Secondary | ICD-10-CM | POA: Insufficient documentation

## 2019-07-16 DIAGNOSIS — C3490 Malignant neoplasm of unspecified part of unspecified bronchus or lung: Secondary | ICD-10-CM

## 2019-07-16 DIAGNOSIS — Z79899 Other long term (current) drug therapy: Secondary | ICD-10-CM | POA: Insufficient documentation

## 2019-07-16 DIAGNOSIS — G629 Polyneuropathy, unspecified: Secondary | ICD-10-CM | POA: Diagnosis not present

## 2019-07-16 DIAGNOSIS — D649 Anemia, unspecified: Secondary | ICD-10-CM

## 2019-07-16 DIAGNOSIS — C61 Malignant neoplasm of prostate: Secondary | ICD-10-CM | POA: Diagnosis not present

## 2019-07-16 DIAGNOSIS — C3431 Malignant neoplasm of lower lobe, right bronchus or lung: Secondary | ICD-10-CM | POA: Insufficient documentation

## 2019-07-16 DIAGNOSIS — C7951 Secondary malignant neoplasm of bone: Secondary | ICD-10-CM | POA: Diagnosis not present

## 2019-07-16 DIAGNOSIS — C7970 Secondary malignant neoplasm of unspecified adrenal gland: Secondary | ICD-10-CM | POA: Diagnosis not present

## 2019-07-16 DIAGNOSIS — Z5112 Encounter for antineoplastic immunotherapy: Secondary | ICD-10-CM

## 2019-07-16 DIAGNOSIS — D6481 Anemia due to antineoplastic chemotherapy: Secondary | ICD-10-CM | POA: Insufficient documentation

## 2019-07-16 LAB — CBC WITH DIFFERENTIAL (CANCER CENTER ONLY)
Abs Immature Granulocytes: 0.04 10*3/uL (ref 0.00–0.07)
Basophils Absolute: 0 10*3/uL (ref 0.0–0.1)
Basophils Relative: 1 %
Eosinophils Absolute: 0.1 10*3/uL (ref 0.0–0.5)
Eosinophils Relative: 2 %
HCT: 33.5 % — ABNORMAL LOW (ref 39.0–52.0)
Hemoglobin: 11.1 g/dL — ABNORMAL LOW (ref 13.0–17.0)
Immature Granulocytes: 1 %
Lymphocytes Relative: 17 %
Lymphs Abs: 1.2 10*3/uL (ref 0.7–4.0)
MCH: 34.8 pg — ABNORMAL HIGH (ref 26.0–34.0)
MCHC: 33.1 g/dL (ref 30.0–36.0)
MCV: 105 fL — ABNORMAL HIGH (ref 80.0–100.0)
Monocytes Absolute: 0.7 10*3/uL (ref 0.1–1.0)
Monocytes Relative: 10 %
Neutro Abs: 5.2 10*3/uL (ref 1.7–7.7)
Neutrophils Relative %: 69 %
Platelet Count: 243 10*3/uL (ref 150–400)
RBC: 3.19 MIL/uL — ABNORMAL LOW (ref 4.22–5.81)
RDW: 14.9 % (ref 11.5–15.5)
WBC Count: 7.3 10*3/uL (ref 4.0–10.5)
nRBC: 0 % (ref 0.0–0.2)

## 2019-07-16 LAB — CMP (CANCER CENTER ONLY)
ALT: 9 U/L (ref 0–44)
AST: 18 U/L (ref 15–41)
Albumin: 2.6 g/dL — ABNORMAL LOW (ref 3.5–5.0)
Alkaline Phosphatase: 94 U/L (ref 38–126)
Anion gap: 8 (ref 5–15)
BUN: 13 mg/dL (ref 8–23)
CO2: 28 mmol/L (ref 22–32)
Calcium: 9.2 mg/dL (ref 8.9–10.3)
Chloride: 101 mmol/L (ref 98–111)
Creatinine: 0.86 mg/dL (ref 0.61–1.24)
GFR, Est AFR Am: >60 mL/min (ref >60)
GFR, Estimated: >60 mL/min (ref >60)
Glucose, Bld: 108 mg/dL — ABNORMAL HIGH (ref 70–99)
Potassium: 4.1 mmol/L (ref 3.5–5.1)
Sodium: 137 mmol/L (ref 135–145)
Total Bilirubin: 0.4 mg/dL (ref 0.3–1.2)
Total Protein: 6.8 g/dL (ref 6.5–8.1)

## 2019-07-16 LAB — TSH: TSH: 1.077 u[IU]/mL (ref 0.320–4.118)

## 2019-07-16 MED ORDER — IOHEXOL 300 MG/ML  SOLN
100.0000 mL | Freq: Once | INTRAMUSCULAR | Status: AC | PRN
  Administered 2019-07-16: 100 mL via INTRAVENOUS

## 2019-07-16 MED ORDER — IOHEXOL 300 MG/ML  SOLN
30.0000 mL | Freq: Once | INTRAMUSCULAR | Status: AC | PRN
  Administered 2019-07-16: 12:00:00 30 mL via ORAL

## 2019-07-16 MED ORDER — SODIUM CHLORIDE (PF) 0.9 % IJ SOLN
INTRAMUSCULAR | Status: AC
  Filled 2019-07-16: qty 50

## 2019-07-19 ENCOUNTER — Encounter: Payer: Self-pay | Admitting: Internal Medicine

## 2019-07-19 ENCOUNTER — Inpatient Hospital Stay (HOSPITAL_BASED_OUTPATIENT_CLINIC_OR_DEPARTMENT_OTHER): Payer: Medicare Other | Admitting: Internal Medicine

## 2019-07-19 ENCOUNTER — Telehealth: Payer: Self-pay | Admitting: Oncology

## 2019-07-19 ENCOUNTER — Other Ambulatory Visit: Payer: Self-pay

## 2019-07-19 VITALS — BP 111/70 | HR 70 | Temp 98.5°F | Resp 18 | Ht 69.0 in | Wt 155.6 lb

## 2019-07-19 DIAGNOSIS — Z5112 Encounter for antineoplastic immunotherapy: Secondary | ICD-10-CM

## 2019-07-19 DIAGNOSIS — I10 Essential (primary) hypertension: Secondary | ICD-10-CM | POA: Diagnosis not present

## 2019-07-19 DIAGNOSIS — Z5111 Encounter for antineoplastic chemotherapy: Secondary | ICD-10-CM

## 2019-07-19 DIAGNOSIS — C3431 Malignant neoplasm of lower lobe, right bronchus or lung: Secondary | ICD-10-CM | POA: Diagnosis not present

## 2019-07-19 DIAGNOSIS — C3491 Malignant neoplasm of unspecified part of right bronchus or lung: Secondary | ICD-10-CM

## 2019-07-19 DIAGNOSIS — C7931 Secondary malignant neoplasm of brain: Secondary | ICD-10-CM | POA: Diagnosis not present

## 2019-07-19 DIAGNOSIS — C7951 Secondary malignant neoplasm of bone: Secondary | ICD-10-CM

## 2019-07-19 NOTE — Telephone Encounter (Signed)
Scheduled per 10/26 los, patient received after visit summary and calender.  °

## 2019-07-19 NOTE — Progress Notes (Signed)
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  Molecular Studies by [facility]: Showed no actionable mutations.  PRIOR THERAPY:1) palliative radiation to the right neck and shoulder.  He is scheduled to have [facility] to the brain lesions on 09/01/2018.  CURRENT THERAPY: 1) palliative systemic chemotherapy with carboplatin for an AUC of 5, Alimta 400 mg/m, and Keytruda 200 mg IV every 3 weeks.  First dose September 30, 2018.  Status post 11 cycles.  Starting from cycle #5 the patient will be treated with maintenance Alimta and Keytruda.  INTERVAL HISTORY: Tony [name] 78 y.o. male returns to the clinic today for follow-up visit.  The patient is feeling much better today with less fatigue compared to 2 months ago.  He continues to have shortness of breath with exertion with mild cough and no hemoptysis.  He denied having any recent weight loss or night sweats.  He has no nausea, vomiting, diarrhea or constipation.  He has no headache or visual changes.  The patient has no fever or chills.  He had repeat CT scan of the chest, abdomen pelvis performed recently and he is here today for evaluation and discussion of his scan results and treatment options.  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   HLD  (hyperlipidemia) 10/13/2018   HTN (hypertension) 10/13/2018   Hyperlipidemia    Hypertension    Lung cancer ([place]) dx'd 07/2018   Mass of lower lobe of right lung    Myocardial infarction [facility])    MI in 2000   Peripheral vascular disease ([place])    Abdominal Aortic Aneurysm   Prostate cancer ([place]) Nov. 2012   Ulcer     ALLERGIES:  has No Known Allergies.  MEDICATIONS:  Current Outpatient Medications  Medication Sig Dispense Refill   acetaminophen (TYLENOL) 650 MG CR tablet Take 650 mg by mouth daily as needed for pain.      aspirin 81 MG tablet Take 81 mg by mouth every evening.      colchicine 0.6 MG tablet Take 0.6 mg by mouth daily.     folic acid (FOLVITE) 621 MCG tablet Take 800 mcg by mouth daily.      gabapentin (NEURONTIN) 100 MG capsule TAKE 2 CAPSULES BY MOUTH AT BEDTIME 180 capsule 1   metoprolol tartrate (LOPRESSOR) 25 MG tablet Take 1 tablet (25 mg total) by mouth 2 (two) times daily. 180 tablet 3   Multiple Vitamins-Minerals (MULTIVITAMIN WITH MINERALS) tablet Take 1 tablet by mouth daily.     niacin 500 MG tablet Take 500 mg by mouth 2 (two) times daily.     Omega-3 Fatty Acids (FISH OIL) 1200 MG CAPS Take 1,200 mg by mouth daily.      omeprazole (PRILOSEC OTC) 20 MG  tablet Take 10 mg by mouth 2 (two) times a week.     polyethylene glycol (MIRALAX / GLYCOLAX) packet Take 17 g by mouth daily.      pravastatin (PRAVACHOL) 10 MG tablet Take 10 mg by mouth every evening.      predniSONE (DELTASONE) 10 MG tablet 7 tablet p.o. daily for 1 week, 5 tablet p.o. daily for 1 week, followed by 3 tablet p.o. daily for 1 week followed by 2 tablets p.o. daily for 1 week followed by 1 tablet p.o. daily for 1 week and then stop. 130 tablet 0   ramipril (ALTACE) 10 MG capsule Take 10 mg by mouth every morning.      triamterene-hydrochlorothiazide (MAXZIDE-25) 37.5-25 MG tablet Take 0.5 tablets by mouth every morning.      No current facility-administered medications  for this visit.     SURGICAL HISTORY:  Past Surgical History:  Procedure Laterality Date   ANAL FISSURECTOMY     COLONOSCOPY     CORONARY ARTERY BYPASS GRAFT     2000   EYE SURGERY Left Nov. 2013   Cataract   EYE SURGERY Right Feb. 2014   Cataract   HERNIA REPAIR     PORTACATH PLACEMENT Left 09/24/2018   Procedure: INSERTION PORT-A-CATH;  Surgeon: Melrose Nakayama, MD;  Location: [facility] OR;  Service: Thoracic;  Laterality: Left;   PR VEIN BYPASS GRAFT,AORTO-FEM-POP  2000   PROSTATE SURGERY  Nov. 2012   ROBOT ASSISTED LAPAROSCOPIC RADICAL PROSTATECTOMY  08/08/2011   Procedure: ROBOTIC ASSISTED LAPAROSCOPIC RADICAL PROSTATECTOMY LEVEL 2;  Surgeon: Dutch Gray, MD;  Location: [facility] ORS;  Service: Urology;  Laterality: Bilateral;  with Bilateral Pelvic Lymphadenectomy    SHOULDER SURGERY  1980'S   VIDEO BRONCHOSCOPY WITH ENDOBRONCHIAL ULTRASOUND N/A 08/06/2018   Procedure: VIDEO BRONCHOSCOPY WITH ENDOBRONCHIAL ULTRASOUND;  Surgeon: Melrose Nakayama, MD;  Location: [facility];  Service: Thoracic;  Laterality: N/A;   WISDOM TOOTH EXTRACTION      REVIEW OF SYSTEMS:  Constitutional: positive for fatigue Eyes: negative Ears, nose, mouth, throat, and face: negative Respiratory: positive for dyspnea on exertion Cardiovascular: negative Gastrointestinal: negative Genitourinary:negative Integument/breast: negative Hematologic/lymphatic: negative Musculoskeletal:positive for muscle weakness Neurological: negative Behavioral/Psych: negative Endocrine: negative Allergic/Immunologic: negative   PHYSICAL EXAMINATION: General appearance: alert, cooperative, fatigued and no distress Head: Normocephalic, without obvious abnormality, atraumatic Neck: no adenopathy, no JVD, supple, symmetrical, trachea midline and thyroid not enlarged, symmetric, no tenderness/mass/nodules Lymph nodes: Cervical, supraclavicular, and axillary nodes normal. Resp: clear to auscultation bilaterally Back:  symmetric, no curvature. ROM normal. No CVA tenderness. Cardio: regular rate and rhythm, S1, S2 normal, no murmur, click, rub or gallop GI: soft, non-tender; bowel sounds normal; no masses,  no organomegaly Extremities: extremities normal, atraumatic, no cyanosis or edema Neurologic: Alert and oriented X 3, normal strength and tone. Normal symmetric reflexes. Normal coordination and gait  ECOG PERFORMANCE STATUS: 1 - Symptomatic but completely ambulatory  Blood pressure 111/70, pulse 70, temperature 98.5 F (36.9 C), temperature source Temporal, resp. rate 18, height 5\' 9"  (1.753 m), weight 155 lb 9.6 oz (70.6 kg), SpO2 96 %.  LABORATORY DATA: Lab Results  Component Value Date   WBC 7.3 07/16/2019   HGB 11.1 (L) 07/16/2019   HCT 33.5 (L) 07/16/2019   MCV 105.0 (H) 07/16/2019   PLT 243 07/16/2019      Chemistry      Component Value Date/Time   NA 137 07/16/2019 1112   NA 139 08/10/2012 1328   K 4.1 07/16/2019 1112  K 4.1 08/10/2012 1328   CL 101 07/16/2019 1112   CL 105 08/10/2012 1328   CO2 28 07/16/2019 1112   CO2 29 08/10/2012 1328   BUN 13 07/16/2019 1112   BUN 18.0 08/10/2012 1328   CREATININE 0.86 07/16/2019 1112   CREATININE 1.1 08/10/2012 1328      Component Value Date/Time   CALCIUM 9.2 07/16/2019 1112   CALCIUM 9.0 08/10/2012 1328   ALKPHOS 94 07/16/2019 1112   ALKPHOS 144 08/10/2012 1328   AST 18 07/16/2019 1112   AST 42 (H) 08/10/2012 1328   ALT 9 07/16/2019 1112   ALT 55 08/10/2012 1328   BILITOT 0.4 07/16/2019 1112   BILITOT 0.91 08/10/2012 1328       RADIOGRAPHIC STUDIES: Ct Chest W Contrast  Result Date: 07/16/2019 CLINICAL DATA:  Non-small-cell lung cancer diagnosed in 2019. Staging. Prostate cancer in 2012. Chemotherapy in progress. Radiation therapy complete. Chronic constipation. EXAM: CT CHEST, ABDOMEN, AND PELVIS WITH CONTRAST TECHNIQUE: Multidetector CT imaging of the chest, abdomen and pelvis was performed following the standard protocol  during bolus administration of intravenous contrast. CONTRAST:  133mL OMNIPAQUE IOHEXOL 300 MG/ML  SOLN COMPARISON:  05/14/2019 FINDINGS: CT CHEST FINDINGS Cardiovascular: Left Port-A-Cath which terminates at the high SVC, similar. Aortic and branch vessel atherosclerosis. Tortuous thoracic aorta. Mild cardiomegaly with native coronary artery atherosclerosis, status post CABG. No central pulmonary embolism, on this non-dedicated study. Mediastinum/Nodes: No supraclavicular adenopathy. A node within the azygoesophageal recess measures 8 mm today versus 6 mm on the prior. Image 29/2 today. No hilar adenopathy. Lungs/Pleura: Tiny bilateral pleural effusions are not significantly changed. Medial right lower lobe pulmonary nodule measures on the order of 2.8 x 2.2 cm including on 74/6. Compare 2.7 x 2.3 cm at the same level on the prior exam (when remeasured). More inferiorly and medially, contiguous nodularity is progressive. Example on 90/6 at 2.0 x 1.8 cm. Compare 1.6 x 1.4 cm at the same level on the prior. Progression is also readily apparent on coronal image 93 today versus coronal image 95 of the prior. On the order of 5.4 x 2.2 cm today versus 4.4 by 2.2 cm on the prior exam (when remeasured). No significant change in interstitial coarsening with areas of mild architectural distortion and traction bronchiectasis. These are most significant in the right lower lobe but present bilaterally. Areas of nodularity are similar less conspicuous today. Previously described nodule along the right major fissure measures 7 mm on 79/6 versus 1.0 cm on the prior. Musculoskeletal: Similar configuration of sclerotic osseous metastasis, including within the right glenoid. CT ABDOMEN PELVIS FINDINGS Hepatobiliary: Too small to characterize liver lesions are unchanged. A hypoattenuating lateral segment left liver lobe lesion is similar at 1.1 cm, slightly greater than fluid density. Normal gallbladder, without biliary ductal  dilatation. Pancreas: Normal, without mass or ductal dilatation. Spleen: Normal in size, without focal abnormality. Adrenals/Urinary Tract: Normal adrenal glands. Mild renal cortical thinning bilaterally. Left renal lesions which are likely cysts and minimally complex cysts. Others are too small to characterize. No hydronephrosis. The bladder wall appears mildly thickened, but is underdistended. Stomach/Bowel: Normal stomach, without wall thickening. Scattered colonic diverticula. Normal terminal ileum and appendix. Normal small bowel. Vascular/Lymphatic: Advanced aortic and branch vessel atherosclerosis. Nonaneurysmal dilatation of the infrarenal aorta at 2.9 cm, similar. 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 No abdominopelvic adenopathy. Reproductive: Prostatectomy, without locally recurrent disease. Other: No significant free fluid. Musculoskeletal: Osteopenia. Similar appearance  of right-sided L1 vertebral body and posterior element sclerotic metastasis. There is also a similar sclerotic lesion within the S1 vertebral body. IMPRESSION: 1. Progressive disease within the right lower lobe, as evidenced by increase size of a dominant lesion, primarily inferiorly and medially. 2. 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 Given stability and morphology, favor drug toxicity. Lymphangitic tumor spread felt less likely. 3. Slight enlargement of a mediastinal node which could be reactive or metastatic. 4. Similar osseous metastasis. 5. No evidence of soft tissue metastasis within the abdomen or pelvis. 6. Similar small bilateral pleural effusions. 7. No change in too small to characterize and indeterminate liver lesions. 8. Coronary artery atherosclerosis. Aortic Atherosclerosis (ICD10-I70.0). Electronically Signed   By: Abigail Miyamoto M.D.   On: 07/16/2019 15:11   Ct Abdomen Pelvis W Contrast  Result Date:  07/16/2019 CLINICAL DATA:  Non-small-cell lung cancer diagnosed in 2019. Staging. Prostate cancer in 2012. Chemotherapy in progress. Radiation therapy complete. Chronic constipation. EXAM: CT CHEST, ABDOMEN, AND PELVIS WITH CONTRAST TECHNIQUE: Multidetector CT imaging of the chest, abdomen and pelvis was performed following the standard protocol during bolus administration of intravenous contrast. CONTRAST:  154mL OMNIPAQUE IOHEXOL 300 MG/ML  SOLN COMPARISON:  05/14/2019 FINDINGS: CT CHEST FINDINGS Cardiovascular: Left Port-A-Cath which terminates at the high SVC, similar. Aortic and branch vessel atherosclerosis. Tortuous thoracic aorta. Mild cardiomegaly with native coronary artery atherosclerosis, status post CABG. No central pulmonary embolism, on this non-dedicated study. Mediastinum/Nodes: No supraclavicular adenopathy. A node within the azygoesophageal recess measures 8 mm today versus 6 mm on the prior. Image 29/2 today. No hilar adenopathy. Lungs/Pleura: Tiny bilateral pleural effusions are not significantly changed. Medial right lower lobe pulmonary nodule measures on the order of 2.8 x 2.2 cm including on 74/6. Compare 2.7 x 2.3 cm at the same level on the prior exam (when remeasured). More inferiorly and medially, contiguous nodularity is progressive. Example on 90/6 at 2.0 x 1.8 cm. Compare 1.6 x 1.4 cm at the same level on the prior. Progression is also readily apparent on coronal image 93 today versus coronal image 95 of the prior. On the order of 5.4 x 2.2 cm today versus 4.4 by 2.2 cm on the prior exam (when remeasured). No significant change in interstitial coarsening with areas of mild architectural distortion and traction bronchiectasis. These are most significant in the right lower lobe but present bilaterally. Areas of nodularity are similar less conspicuous today. Previously described nodule along the right major fissure measures 7 mm on 79/6 versus 1.0 cm on the prior. Musculoskeletal:  Similar configuration of sclerotic osseous metastasis, including within the right glenoid. CT ABDOMEN PELVIS FINDINGS Hepatobiliary: Too small to characterize liver lesions are unchanged. A hypoattenuating lateral segment left liver lobe lesion is similar at 1.1 cm, slightly greater than fluid density. Normal gallbladder, without biliary ductal dilatation. Pancreas: Normal, without mass or ductal dilatation. Spleen: Normal in size, without focal abnormality. Adrenals/Urinary Tract: Normal adrenal glands. Mild renal cortical thinning bilaterally. Left renal lesions which are likely cysts and minimally complex cysts. Others are too small to characterize. No hydronephrosis. The bladder wall appears mildly thickened, but is underdistended. Stomach/Bowel: Normal stomach, without wall thickening. Scattered colonic diverticula. Normal terminal ileum and appendix. Normal small bowel. Vascular/Lymphatic: Advanced aortic and branch vessel atherosclerosis. Nonaneurysmal dilatation of the infrarenal aorta at 2.9 cm, similar. 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 No abdominopelvic adenopathy. Reproductive: Prostatectomy, without locally  recurrent disease. Other: No significant free fluid. Musculoskeletal: Osteopenia. Similar appearance of right-sided L1 vertebral body and posterior element sclerotic metastasis. There is also a similar sclerotic lesion within the S1 vertebral body. IMPRESSION: 1. Progressive disease within the right lower lobe, as evidenced by increase size of a dominant lesion, primarily inferiorly and medially. 2. 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 Given stability and morphology, favor drug toxicity. Lymphangitic tumor spread felt less likely. 3. Slight enlargement of a mediastinal node which could be reactive or metastatic. 4. Similar osseous metastasis. 5. No evidence of soft tissue metastasis  within the abdomen or pelvis. 6. Similar small bilateral pleural effusions. 7. No change in too small to characterize and indeterminate liver lesions. 8. Coronary artery atherosclerosis. Aortic Atherosclerosis (ICD10-I70.0). Electronically Signed   By: Abigail Miyamoto M.D.   On: 07/16/2019 15:11    ASSESSMENT AND PLAN: This is a very pleasant 78 years old white male with a stage IV non-small cell lung cancer, adenocarcinoma presented with large right lower lobe lung mass in addition to mediastinal lymphadenopathy, metastatic disease to the adrenal glands as well as several metastatic bone lesions including the right shoulder, lumbar spine and pelvic bones.  The patient has no actionable mutations. He underwent palliative radiotherapy to the right shoulder and painful metastatic bone lesions. He is feeling much better after the radiotherapy. He is currently undergoing systemic chemotherapy tomorrow with carboplatin for AUC of 5, Alimta 500 mg/M2 and Keytruda 200 mg IV every 3 weeks.  Status post 4 cycles. He is currently on maintenance treatment with Alimta and Keytruda status post 7 cycles.   The patient has been on observation the last 2 months taking a break of chemotherapy.  Is feeling a little bit better but unfortunately his repeat imaging studies showed evidence for disease progression especially in the lung. I personally and independently reviewed the scan images and discussed the results with the patient and his wife who was available by phone during the visit. I gave the patient the option of continuous observation and close monitoring versus resuming his treatment with the combination of Alimta and Keytruda versus single agent Keytruda.  The patient would like to resume his treatment with the doublet chemotherapy and Keytruda. He is expected to start the next cycle of his treatment on July 26, 2019. He will come back for follow-up visit in 4 weeks for evaluation with the following cycle of his  treatment. 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  We will continue to monitor him closely and consider the patient for transfusion if needed. The patient was advised to call immediately if he has any concerning symptoms in the interval. The patient voices understanding of current disease status and treatment options and is in agreement with the current care plan. All questions were answered. The patient knows to call the clinic with any problems, questions or concerns. We can certainly see the patient much sooner if necessary.   Disclaimer: This note was dictated with voice recognition software. Similar sounding words can inadvertently be transcribed and may not be corrected upon review.

## 2019-07-24 ENCOUNTER — Encounter (HOSPITAL_COMMUNITY): Payer: Self-pay

## 2019-07-24 ENCOUNTER — Other Ambulatory Visit: Payer: Self-pay

## 2019-07-24 ENCOUNTER — Inpatient Hospital Stay (HOSPITAL_COMMUNITY)
Admission: EM | Admit: 2019-07-24 | Discharge: 2019-07-29 | DRG: 641 | Disposition: A | Discharge status: Skilled Nursing Facility | Payer: Medicare Other | Attending: Family Medicine | Admitting: Family Medicine

## 2019-07-24 ENCOUNTER — Emergency Department (HOSPITAL_COMMUNITY): Payer: Medicare Other

## 2019-07-24 DIAGNOSIS — D630 Anemia in neoplastic disease: Secondary | ICD-10-CM | POA: Diagnosis present

## 2019-07-24 DIAGNOSIS — Z7982 Long term (current) use of aspirin: Secondary | ICD-10-CM

## 2019-07-24 DIAGNOSIS — Y92002 Bathroom of unspecified non-institutional (private) residence single-family (private) house as the place of occurrence of the external cause: Secondary | ICD-10-CM | POA: Diagnosis not present

## 2019-07-24 DIAGNOSIS — I10 Essential (primary) hypertension: Secondary | ICD-10-CM | POA: Diagnosis present

## 2019-07-24 DIAGNOSIS — E876 Hypokalemia: Secondary | ICD-10-CM | POA: Diagnosis not present

## 2019-07-24 DIAGNOSIS — C3491 Malignant neoplasm of unspecified part of right bronchus or lung: Secondary | ICD-10-CM | POA: Diagnosis present

## 2019-07-24 DIAGNOSIS — E785 Hyperlipidemia, unspecified: Secondary | ICD-10-CM | POA: Diagnosis present

## 2019-07-24 DIAGNOSIS — K5730 Diverticulosis of large intestine without perforation or abscess without bleeding: Secondary | ICD-10-CM | POA: Diagnosis present

## 2019-07-24 DIAGNOSIS — I25118 Atherosclerotic heart disease of native coronary artery with other forms of angina pectoris: Secondary | ICD-10-CM

## 2019-07-24 DIAGNOSIS — D7589 Other specified diseases of blood and blood-forming organs: Secondary | ICD-10-CM

## 2019-07-24 DIAGNOSIS — Z8546 Personal history of malignant neoplasm of prostate: Secondary | ICD-10-CM | POA: Diagnosis not present

## 2019-07-24 DIAGNOSIS — E869 Volume depletion, unspecified: Principal | ICD-10-CM | POA: Diagnosis present

## 2019-07-24 DIAGNOSIS — I4891 Unspecified atrial fibrillation: Secondary | ICD-10-CM | POA: Diagnosis present

## 2019-07-24 DIAGNOSIS — C7931 Secondary malignant neoplasm of brain: Secondary | ICD-10-CM | POA: Diagnosis present

## 2019-07-24 DIAGNOSIS — Z20828 Contact with and (suspected) exposure to other viral communicable diseases: Secondary | ICD-10-CM | POA: Diagnosis present

## 2019-07-24 DIAGNOSIS — S0181XA Laceration without foreign body of other part of head, initial encounter: Secondary | ICD-10-CM | POA: Diagnosis present

## 2019-07-24 DIAGNOSIS — I2510 Atherosclerotic heart disease of native coronary artery without angina pectoris: Secondary | ICD-10-CM | POA: Diagnosis present

## 2019-07-24 DIAGNOSIS — I480 Paroxysmal atrial fibrillation: Secondary | ICD-10-CM | POA: Diagnosis not present

## 2019-07-24 DIAGNOSIS — Z951 Presence of aortocoronary bypass graft: Secondary | ICD-10-CM | POA: Diagnosis not present

## 2019-07-24 DIAGNOSIS — M109 Gout, unspecified: Secondary | ICD-10-CM | POA: Diagnosis present

## 2019-07-24 DIAGNOSIS — Z923 Personal history of irradiation: Secondary | ICD-10-CM

## 2019-07-24 DIAGNOSIS — W1811XA Fall from or off toilet without subsequent striking against object, initial encounter: Secondary | ICD-10-CM | POA: Diagnosis present

## 2019-07-24 DIAGNOSIS — I252 Old myocardial infarction: Secondary | ICD-10-CM

## 2019-07-24 DIAGNOSIS — Z23 Encounter for immunization: Secondary | ICD-10-CM | POA: Diagnosis present

## 2019-07-24 DIAGNOSIS — Z87891 Personal history of nicotine dependence: Secondary | ICD-10-CM

## 2019-07-24 DIAGNOSIS — R197 Diarrhea, unspecified: Secondary | ICD-10-CM | POA: Diagnosis not present

## 2019-07-24 DIAGNOSIS — G629 Polyneuropathy, unspecified: Secondary | ICD-10-CM | POA: Diagnosis present

## 2019-07-24 DIAGNOSIS — C7951 Secondary malignant neoplasm of bone: Secondary | ICD-10-CM | POA: Diagnosis present

## 2019-07-24 DIAGNOSIS — Z9079 Acquired absence of other genital organ(s): Secondary | ICD-10-CM

## 2019-07-24 DIAGNOSIS — Z7952 Long term (current) use of systemic steroids: Secondary | ICD-10-CM

## 2019-07-24 DIAGNOSIS — E860 Dehydration: Secondary | ICD-10-CM | POA: Diagnosis present

## 2019-07-24 DIAGNOSIS — I959 Hypotension, unspecified: Secondary | ICD-10-CM | POA: Diagnosis present

## 2019-07-24 DIAGNOSIS — K649 Unspecified hemorrhoids: Secondary | ICD-10-CM | POA: Diagnosis present

## 2019-07-24 DIAGNOSIS — R55 Syncope and collapse: Secondary | ICD-10-CM | POA: Diagnosis present

## 2019-07-24 DIAGNOSIS — R9431 Abnormal electrocardiogram [ECG] [EKG]: Secondary | ICD-10-CM | POA: Diagnosis not present

## 2019-07-24 DIAGNOSIS — Z9221 Personal history of antineoplastic chemotherapy: Secondary | ICD-10-CM

## 2019-07-24 DIAGNOSIS — Z79899 Other long term (current) drug therapy: Secondary | ICD-10-CM

## 2019-07-24 DIAGNOSIS — Z8249 Family history of ischemic heart disease and other diseases of the circulatory system: Secondary | ICD-10-CM

## 2019-07-24 DIAGNOSIS — W19XXXA Unspecified fall, initial encounter: Secondary | ICD-10-CM

## 2019-07-24 LAB — PROTIME-INR
INR: 1.1 (ref 0.8–1.2)
Prothrombin Time: 14.2 seconds (ref 11.4–15.2)

## 2019-07-24 LAB — TROPONIN I (HIGH SENSITIVITY)
Troponin I (High Sensitivity): 13 ng/L (ref <18)
Troponin I (High Sensitivity): 13 ng/L (ref <18)

## 2019-07-24 LAB — URINALYSIS, ROUTINE W REFLEX MICROSCOPIC
Bilirubin Urine: NEGATIVE
Glucose, UA: NEGATIVE mg/dL
Hgb urine dipstick: NEGATIVE
Ketones, ur: NEGATIVE mg/dL
Leukocytes,Ua: NEGATIVE
Nitrite: NEGATIVE
Protein, ur: NEGATIVE mg/dL
Specific Gravity, Urine: 1.013 (ref 1.005–1.030)
pH: 5 (ref 5.0–8.0)

## 2019-07-24 LAB — C DIFFICILE QUICK SCREEN W PCR REFLEX
C Diff antigen: NEGATIVE
C Diff interpretation: NOT DETECTED
C Diff toxin: NEGATIVE

## 2019-07-24 LAB — HEPATIC FUNCTION PANEL
ALT: 14 U/L (ref 0–44)
AST: 24 U/L (ref 15–41)
Albumin: 2.5 g/dL — ABNORMAL LOW (ref 3.5–5.0)
Alkaline Phosphatase: 96 U/L (ref 38–126)
Bilirubin, Direct: 0.1 mg/dL (ref 0.0–0.2)
Indirect Bilirubin: 0.6 mg/dL (ref 0.3–0.9)
Total Bilirubin: 0.7 mg/dL (ref 0.3–1.2)
Total Protein: 6.3 g/dL — ABNORMAL LOW (ref 6.5–8.1)

## 2019-07-24 LAB — VITAMIN B12: Vitamin B-12: 699 pg/mL (ref 180–914)

## 2019-07-24 LAB — BASIC METABOLIC PANEL
Anion gap: 12 (ref 5–15)
BUN: 13 mg/dL (ref 8–23)
CO2: 21 mmol/L — ABNORMAL LOW (ref 22–32)
Calcium: 8.8 mg/dL — ABNORMAL LOW (ref 8.9–10.3)
Chloride: 104 mmol/L (ref 98–111)
Creatinine, Ser: 1 mg/dL (ref 0.61–1.24)
GFR calc Af Amer: >60 mL/min (ref >60)
GFR calc non Af Amer: >60 mL/min (ref >60)
Glucose, Bld: 125 mg/dL — ABNORMAL HIGH (ref 70–99)
Potassium: 4 mmol/L (ref 3.5–5.1)
Sodium: 137 mmol/L (ref 135–145)

## 2019-07-24 LAB — PHOSPHORUS: Phosphorus: 3.7 mg/dL (ref 2.5–4.6)

## 2019-07-24 LAB — CBC
HCT: 37.2 % — ABNORMAL LOW (ref 39.0–52.0)
Hemoglobin: 12.3 g/dL — ABNORMAL LOW (ref 13.0–17.0)
MCH: 35.2 pg — ABNORMAL HIGH (ref 26.0–34.0)
MCHC: 33.1 g/dL (ref 30.0–36.0)
MCV: 106.6 fL — ABNORMAL HIGH (ref 80.0–100.0)
Platelets: 242 10*3/uL (ref 150–400)
RBC: 3.49 MIL/uL — ABNORMAL LOW (ref 4.22–5.81)
RDW: 14.9 % (ref 11.5–15.5)
WBC: 10.3 10*3/uL (ref 4.0–10.5)
nRBC: 0 % (ref 0.0–0.2)

## 2019-07-24 LAB — MAGNESIUM: Magnesium: 1.8 mg/dL (ref 1.7–2.4)

## 2019-07-24 LAB — APTT: aPTT: 35 seconds (ref 24–36)

## 2019-07-24 LAB — SARS CORONAVIRUS 2 (TAT 6-24 HRS): SARS Coronavirus 2: NEGATIVE

## 2019-07-24 LAB — LACTIC ACID, PLASMA: Lactic Acid, Venous: 1.5 mmol/L (ref 0.5–1.9)

## 2019-07-24 LAB — TSH: TSH: 1.592 u[IU]/mL (ref 0.350–4.500)

## 2019-07-24 MED ORDER — FOLIC ACID 1 MG PO TABS
1.0000 mg | ORAL_TABLET | Freq: Every day | ORAL | Status: DC
  Administered 2019-07-25 – 2019-07-29 (×5): 1 mg via ORAL
  Filled 2019-07-24 (×5): qty 1

## 2019-07-24 MED ORDER — ADULT MULTIVITAMIN W/MINERALS CH
1.0000 | ORAL_TABLET | Freq: Every day | ORAL | Status: DC
  Administered 2019-07-25 – 2019-07-29 (×5): 1 via ORAL
  Filled 2019-07-24 (×5): qty 1

## 2019-07-24 MED ORDER — PANTOPRAZOLE SODIUM 40 MG PO TBEC: 40.0000 mg | DELAYED_RELEASE_TABLET | Freq: Every day | ORAL | Status: DC | PRN

## 2019-07-24 MED ORDER — GABAPENTIN 100 MG PO CAPS
200.0000 mg | ORAL_CAPSULE | Freq: Every day | ORAL | Status: DC
  Administered 2019-07-24 – 2019-07-28 (×5): 200 mg via ORAL
  Filled 2019-07-24 (×5): qty 2

## 2019-07-24 MED ORDER — POLYETHYLENE GLYCOL 3350 17 G PO PACK
17.0000 g | PACK | Freq: Every day | ORAL | Status: DC | PRN
  Administered 2019-07-26: 17 g via ORAL
  Filled 2019-07-24 (×2): qty 1

## 2019-07-24 MED ORDER — SODIUM CHLORIDE 0.9 % IV BOLUS
500.0000 mL | Freq: Once | INTRAVENOUS | Status: AC
  Administered 2019-07-24: 500 mL via INTRAVENOUS

## 2019-07-24 MED ORDER — ACETAMINOPHEN 325 MG PO TABS: 650.0000 mg | ORAL_TABLET | Freq: Four times a day (QID) | ORAL | Status: DC | PRN

## 2019-07-24 MED ORDER — ACETAMINOPHEN 650 MG RE SUPP: 650.0000 mg | Freq: Four times a day (QID) | RECTAL | Status: DC | PRN

## 2019-07-24 MED ORDER — ASPIRIN EC 81 MG PO TBEC
81.0000 mg | DELAYED_RELEASE_TABLET | Freq: Every day | ORAL | Status: DC
  Administered 2019-07-24 – 2019-07-28 (×5): 81 mg via ORAL
  Filled 2019-07-24 (×5): qty 1

## 2019-07-24 MED ORDER — LIDOCAINE HCL (PF) 1 % IJ SOLN
5.0000 mL | Freq: Once | INTRAMUSCULAR | Status: AC
  Administered 2019-07-24: 5 mL via INTRADERMAL
  Filled 2019-07-24: qty 5

## 2019-07-24 MED ORDER — NIACIN 500 MG PO TABS
500.0000 mg | ORAL_TABLET | Freq: Two times a day (BID) | ORAL | Status: DC
  Administered 2019-07-24 – 2019-07-29 (×10): 500 mg via ORAL
  Filled 2019-07-24 (×11): qty 1

## 2019-07-24 MED ORDER — SODIUM CHLORIDE 0.9 % IV SOLN
INTRAVENOUS | Status: DC
  Administered 2019-07-24 – 2019-07-26 (×5): via INTRAVENOUS

## 2019-07-24 MED ORDER — SODIUM CHLORIDE 0.9 % IV BOLUS
1000.0000 mL | Freq: Once | INTRAVENOUS | Status: AC
  Administered 2019-07-24: 1000 mL via INTRAVENOUS

## 2019-07-24 MED ORDER — ONDANSETRON HCL 4 MG/2ML IJ SOLN: 4.0000 mg | Freq: Four times a day (QID) | INTRAMUSCULAR | Status: DC | PRN

## 2019-07-24 MED ORDER — OMEPRAZOLE MAGNESIUM 20 MG PO TBEC: 20.0000 mg | DELAYED_RELEASE_TABLET | Freq: Every day | ORAL | Status: DC | PRN

## 2019-07-24 MED ORDER — PRAVASTATIN SODIUM 10 MG PO TABS
10.0000 mg | ORAL_TABLET | Freq: Every day | ORAL | Status: DC
  Administered 2019-07-24 – 2019-07-26 (×3): 10 mg via ORAL
  Filled 2019-07-24 (×3): qty 1

## 2019-07-24 MED ORDER — ONDANSETRON HCL 4 MG PO TABS: 4.0000 mg | ORAL_TABLET | Freq: Four times a day (QID) | ORAL | Status: DC | PRN

## 2019-07-24 NOTE — ED Notes (Signed)
Pt has had 3 loose Bms since he has been here, notified Dr. Jeanell Sparrow.

## 2019-07-24 NOTE — Plan of Care (Signed)

## 2019-07-24 NOTE — ED Provider Notes (Signed)
[facility] EMERGENCY DEPARTMENT Provider Note   CSN: [id number] Arrival date & time: 07/24/19  1333     History   Chief Complaint Chief Complaint  Patient presents with   Fall    HPI Tony [name] is a 78 y.o. male.     HPI  78 year old male history of stage IV lung cancer, anemia, hypertension, MI presents today after a fall.  He states that he was in the bathroom on the commode and straining to have a bowel movement.  When he went to get up he fell to the ground.  He states that he does remember the event and does not think that he tripped or passed out.  He denies any other symptoms prior to the fall.  He has a laceration above his left eye and does not have other complaints.  He is unable to get up on his own and was transported here via EMS.  He denies being on blood thinners.  Per EMS he had loose bowel movements.  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   HLD (hyperlipidemia) 10/13/2018   HTN (hypertension) 10/13/2018   Hyperlipidemia    Hypertension    Lung cancer ([place]) dx'd 07/2018   Mass of lower lobe of right lung    Myocardial infarction [facility])    MI in 2000   Peripheral vascular disease ([place])    Abdominal Aortic Aneurysm   Prostate cancer ([place]) Nov. 2012   Ulcer     Patient Active Problem List   Diagnosis Date Noted   Anemia due to antineoplastic chemotherapy 03/16/2019   Protein calorie malnutrition ([place]) 10/27/2018   Constipation 10/27/2018   Port-A-Cath in place 10/20/2018   CAD (coronary artery disease) 10/13/2018   Hx of CABG 10/13/2018   Contusion of head 10/13/2018   HTN  (hypertension) 10/13/2018   HLD (hyperlipidemia) 10/13/2018   Encounter for antineoplastic chemotherapy 08/27/2018   Encounter for antineoplastic immunotherapy 08/27/2018   Goals of care, counseling/discussion 08/27/2018   Brain metastases ([place]) 08/26/2018   Adenocarcinoma, lung, 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 2013   Cataract   EYE SURGERY Right Feb. 2014   Cataract   HERNIA REPAIR     PORTACATH PLACEMENT Left 09/24/2018   Procedure: INSERTION PORT-A-CATH;  Surgeon: Melrose Nakayama, MD;  Location: [facility] OR;  Service: Thoracic;  Laterality: Left;   PR VEIN BYPASS GRAFT,AORTO-FEM-POP  2000   PROSTATE SURGERY  Nov. 2012   ROBOT ASSISTED LAPAROSCOPIC RADICAL PROSTATECTOMY  08/08/2011   Procedure: ROBOTIC ASSISTED LAPAROSCOPIC RADICAL PROSTATECTOMY LEVEL 2;  Surgeon: Dutch Gray, MD;  Location: [facility] ORS;  Service: Urology;  Laterality: Bilateral;  with Bilateral Pelvic Lymphadenectomy    SHOULDER SURGERY  1980'S  VIDEO BRONCHOSCOPY WITH ENDOBRONCHIAL ULTRASOUND N/A 08/06/2018   Procedure: VIDEO BRONCHOSCOPY WITH ENDOBRONCHIAL ULTRASOUND;  Surgeon: Melrose Nakayama, MD;  Location: [place];  Service: Thoracic;  Laterality: N/A;   WISDOM TOOTH EXTRACTION          Home Medications    Prior to Admission medications   Medication Sig Start Date End Date Taking? Authorizing Provider  acetaminophen (TYLENOL) 650 MG CR tablet Take 650 mg by mouth daily as needed for pain.     [provider]  aspirin 81 MG tablet Take 81 mg by mouth every evening.     [provider]  colchicine 0.6 MG tablet Take 0.6 mg by mouth daily.    [provider]  folic acid (FOLVITE) 277 MCG tablet Take 800 mcg by mouth daily.     [provider]  gabapentin (NEURONTIN) 100 MG capsule TAKE 2 CAPSULES BY MOUTH AT BEDTIME 02/13/19   Curt Bears, MD  metoprolol tartrate (LOPRESSOR) 25 MG tablet Take 1 tablet (25 mg total) by mouth 2 (two) times daily. 05/21/19   Jettie Booze, MD  Multiple Vitamins-Minerals (MULTIVITAMIN WITH MINERALS) tablet Take 1 tablet by mouth daily.    [provider]  niacin 500 MG tablet Take 500 mg by mouth 2 (two) times daily.    [provider]  Omega-3 Fatty Acids (FISH OIL) 1200 MG CAPS Take 1,200 mg by mouth daily.     [provider]  omeprazole (PRILOSEC OTC) 20 MG tablet Take 10 mg by mouth 2 (two) times a week.    [provider]  polyethylene glycol (MIRALAX / GLYCOLAX) packet Take 17 g by mouth daily.     [provider]  pravastatin (PRAVACHOL) 10 MG tablet Take 10 mg by mouth every evening.  07/29/18   [provider]  predniSONE (DELTASONE) 10 MG tablet 7 tablet p.o. daily for 1 week, 5 tablet p.o. daily for 1 week, followed by 3 tablet p.o. daily for 1 week followed by 2 tablets p.o. daily for 1 week followed by 1 tablet p.o. daily for 1 week and then stop. 05/18/19   Curt Bears, MD  ramipril (ALTACE) 10 MG capsule Take 10 mg by mouth every morning.     [provider]  triamterene-hydrochlorothiazide (MAXZIDE-25) 37.5-25 MG tablet Take 0.5 tablets by mouth every morning.     [provider]    Family History Family History  Problem Relation Age of Onset   Diabetes Mother    Heart disease Mother        CHF   Heart disease Father    Heart failure Father        Amputation- Leg   Deep vein thrombosis Father    Heart disease Brother        Heart Disease before age 88   Heart failure Brother    Hyperlipidemia Brother    Hypertension Brother    Heart disease Sister    Diabetes Sister     Heart failure Sister     Social History Social History   Tobacco Use   Smoking status: Former Smoker    Packs/day: 1.50    Years: 43.00    Pack years: 64.50    Types: Cigarettes    Quit date: 09/23/1998    Years since quitting: 20.8   Smokeless tobacco: Never Used  Substance Use Topics   Alcohol use: Yes    Alcohol/week: 7.0 standard drinks    Types: 7 Glasses of  wine per week    Comment: half glass per day   Drug use: No     Allergies   Patient has no known allergies.   Review of Systems Review of Systems  All other systems reviewed and are negative.    Physical Exam Updated Vital Signs BP (!) 106/56    Pulse 61    Temp 97.7 F (36.5 C) (Rectal)    Resp 18    Ht 1.753 m (5\' 9" )    Wt 70.3 kg    SpO2 98%    BMI 22.89 kg/m   Physical Exam Vitals signs and nursing note reviewed.  Constitutional:      General: He is not in acute distress.    Appearance: Normal appearance. He is not toxic-appearing.  HENT:     Head:     Comments: 5 cm laceration above left eye Pupils are equal and reactive    Right Ear: External ear normal.     Left Ear: External ear normal.     Nose: Nose normal.     Mouth/Throat:     Mouth: Mucous membranes are moist.  Eyes:     Pupils: Pupils are equal, round, and reactive to light.  Neck:     Musculoskeletal: Normal range of motion.     Comments: No tenderness palpated to neck Full active range of motion back Cardiovascular:     Rate and Rhythm: Normal rate and regular rhythm.     Pulses: Normal pulses.  Pulmonary:     Effort: Pulmonary effort is normal.     Breath sounds: Normal breath sounds.  Abdominal:     General: Abdomen is flat.     Palpations: Abdomen is soft.  Musculoskeletal: Normal range of motion.     Comments: Skin tears on fingers on right hand No point tenderness Full active range of motion of bilateral hands, elbows, shoulders No tenderness noted over pelvis or hips with full active range of motion of hips  knees and ankles No tenderness to palpation of her cervical, thoracic, or lumbar spine  Skin:    General: Skin is warm and dry.     Capillary Refill: Capillary refill takes less than 2 seconds.  Neurological:     General: No focal deficit present.     Mental Status: He is alert and oriented to person, place, and time.     Cranial Nerves: No cranial nerve deficit.     Motor: No weakness.  Psychiatric:        Mood and Affect: Mood normal.      ED Treatments / Results  Labs (all labs ordered are listed, but only abnormal results are displayed) Labs Reviewed  CBC - Abnormal; Notable for the following components:      Result Value   RBC 3.49 (*)    Hemoglobin 12.3 (*)    HCT 37.2 (*)    MCV 106.6 (*)    MCH 35.2 (*)    All other components within normal limits  BASIC METABOLIC PANEL  TROPONIN I (HIGH SENSITIVITY)    EKG EKG Interpretation  Date/Time:  Saturday July 24 2019 13:39:12 EDT Ventricular Rate:  67 PR Interval:    QRS Duration: 153 QT Interval:  495 QTC Calculation: 465 R Axis:   -44 Text Interpretation: Atrial fibrillation Ventricular premature complex Nonspecific IVCD with LAD Anterolateral infarct, age indeterminate Confirmed by Pattricia Boss [phone]) on 07/24/2019 2:41:09 PM   Radiology Dg Chest 2 View  Result Date: 07/24/2019 CLINICAL DATA:  Pt arrived via [facility] from home. Pt was on toilet trying to have BM that was unsuccessful, pt went to sink and became dizzy and fell and hit head on floor. Pt denies LOC, pt denies blood thinners. Pt states hx of stage 3 lung cancer, states they are changing his chemo meds on Monday. Pt hx MI, HTN, CAD. Pt is a former smoker EXAM: CHEST - 2 VIEW COMPARISON:  09/24/2018 FINDINGS: Stable changes from prior CABG surgery. Cardiac silhouette is normal in size. No mediastinal or hilar masses. Coarsely thickened interstitial markings bilaterally. There is hazy opacity at the right lung base partly obscuring the hemidiaphragm.  Suspect a small effusion with atelectasis. No convincing pneumonia. No pneumothorax. Left anterior chest wall Port-A-Cath tip projects in the mid superior vena cava. Skeletal structures are grossly intact. IMPRESSION: 1. No acute findings. 2. Interstitial thickening consistent interstitial lung disease. 3. Mild opacity at the right lung base is likely due to a small effusion with atelectasis. Electronically Signed   By: Lajean Manes M.D.   On: 07/24/2019 14:49   Ct Head Wo Contrast  Result Date: 07/24/2019 CLINICAL DATA:  Fall, head injury, laceration above left eye EXAM: CT HEAD WITHOUT CONTRAST CT CERVICAL SPINE WITHOUT CONTRAST TECHNIQUE: Multidetector CT imaging of the head and cervical spine was performed following the standard protocol without intravenous contrast. Multiplanar CT image reconstructions of the cervical spine were also generated. COMPARISON:  10/14/2018 FINDINGS: CT HEAD FINDINGS Brain: No evidence of acute infarction, hemorrhage, hydrocephalus, extra-axial collection or mass lesion/mass effect. Periventricular white matter hypodensity. Vascular: No hyperdense vessel or unexpected calcification. Skull: Normal. Negative for fracture or focal lesion. Sinuses/Orbits: No acute finding. Other: Soft tissue laceration and hematoma of the left cheek and forehead. CT CERVICAL SPINE FINDINGS Alignment: Normal. Skull base and vertebrae: No acute fracture. No primary bone lesion or focal pathologic process. 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 Soft tissues and spinal canal: No prevertebral fluid or swelling. No visible canal hematoma. Disc levels: Moderate disc space height loss and osteophytosis at C4 through C6. Disc spaces are otherwise preserved. Upper chest: Negative. Other: None. IMPRESSION: 1.  No acute intracranial pathology. 2. Soft tissue laceration and hematoma of the left cheek and forehead. 3.  No fracture or static subluxation of the  cervical spine. Electronically Signed   By: Eddie Candle M.D.   On: 07/24/2019 14:52   Ct Cervical Spine Wo Contrast  Result Date: 07/24/2019 CLINICAL DATA:  Fall, head injury, laceration above left eye EXAM: CT HEAD WITHOUT CONTRAST CT CERVICAL SPINE WITHOUT CONTRAST TECHNIQUE: Multidetector CT imaging of the head and cervical spine was performed following the standard protocol without intravenous contrast. Multiplanar CT image reconstructions of the cervical spine were also generated. COMPARISON:  10/14/2018 FINDINGS: CT HEAD FINDINGS Brain: No evidence of acute infarction, hemorrhage, hydrocephalus, extra-axial collection or mass lesion/mass effect. Periventricular white matter hypodensity. Vascular: No hyperdense vessel or unexpected calcification. Skull: Normal. Negative for fracture or focal lesion. Sinuses/Orbits: No acute finding. Other: Soft tissue laceration and hematoma of the left cheek and forehead. CT CERVICAL SPINE FINDINGS Alignment: Normal. Skull base and vertebrae: No acute fracture. No primary bone lesion or focal pathologic process. 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 Soft tissues and spinal canal: No prevertebral fluid or swelling. No visible canal hematoma. Disc levels: Moderate disc space height loss and osteophytosis at C4 through C6. Disc spaces are otherwise preserved. Upper  chest: Negative. Other: None. IMPRESSION: 1.  No acute intracranial pathology. 2. Soft tissue laceration and hematoma of the left cheek and forehead. 3.  No fracture or static subluxation of the cervical spine. Electronically Signed   By: Eddie Candle M.D.   On: 07/24/2019 14:52    Procedures .Critical Care Performed by: Pattricia Boss, MD Authorized by: Pattricia Boss, MD   Critical care provider statement:    Critical care time (minutes):  45   Critical care end time:  07/24/2019 3:35 PM   Critical care was necessary to treat or prevent imminent or  life-threatening deterioration of the following conditions:  Dehydration   Critical care was time spent personally by me on the following activities:  Discussions with consultants, evaluation of patient's response to treatment, examination of patient, ordering and performing treatments and interventions, ordering and review of laboratory studies, ordering and review of radiographic studies, pulse oximetry, re-evaluation of patient's condition, obtaining history from patient or surrogate and review of old charts   (including critical care time)  Medications Ordered in ED Medications  sodium chloride 0.9 % bolus 500 mL (has no administration in time range)     Initial Impression / Assessment and Plan / ED Course  I have reviewed the triage vital signs and the nursing notes.  Pertinent labs & imaging results that were available during my care of the patient were reviewed by me and considered in my medical decision making (see chart for details).       3:16 PM Nurse reports 3 loose bowel movements Discussed with Dr.Vaithi and she will repair lacerations Plan admission for hypotension, syncope, with diarrhea. New onset a fib 1- near syncope 2-collapse with facial trauma- laceration being repaired 3 new onset a fib 4- diarrhea 5 borderline hypotension- fluids ensuing  Discussed with Dr. Deretha Emory and she will see for admission  Final Clinical Impressions(s) / ED Diagnoses   Final diagnoses:  Near syncope  Fall, initial encounter  Facial laceration, initial encounter  New onset a-fib [facility])    ED Discharge Orders    None       Pattricia Boss, MD 07/24/19 1535

## 2019-07-24 NOTE — Progress Notes (Signed)
Patient had large bm in toilet on arrival to room he report hemorrhoids had some blood tinged in toilet he stated been happening since started having diarrhea. The stool was normal looking brown color but loose. He had C diff lab pending results  Meanwhile he has been place on enteric precautions. He ambulated with rolling walker to bathroom and back to bed,his gait was steady with walker but he states is very weak. He has bruising to arms bilaterally as well to head wound left side next to his stitches from a fall he sustained the head injury at home. He looks like he has black eye with swelling from his fall at home he denies distress.

## 2019-07-24 NOTE — ED Provider Notes (Signed)
  [facility] EMERGENCY DEPARTMENT Provider Note   CSN: [id number] Arrival date & time: 07/24/19  1333  Procedures .Marland KitchenLaceration Repair  Date/Time: 07/24/2019 4:33 PM Performed by: Julianne Rice, MD Authorized by: Mckinley Jewel, MD   Consent:    Consent obtained:  Verbal   Consent given by:  Patient   Risks discussed:  Pain, poor cosmetic result and poor wound healing   Alternatives discussed:  No treatment and delayed treatment Anesthesia (see MAR for exact dosages):    Anesthesia method:  Local infiltration   Local anesthetic:  Lidocaine 1% w/o epi Laceration details:    Location:  Face   Face location:  Forehead   Length (cm):  6 Repair type:    Repair type:  Simple Pre-procedure details:    Preparation:  Patient was prepped and draped in usual sterile fashion Exploration:    Hemostasis achieved with:  Direct pressure   Wound exploration: wound explored through full range of motion and entire depth of wound probed and visualized     Contaminated: no   Treatment:    Amount of cleaning:  Standard   Irrigation solution:  Sterile water   Visualized foreign bodies/material removed: no   Skin repair:    Repair method:  Sutures   Suture size:  3-0   Suture material:  Prolene   Number of sutures:  8 Approximation:    Approximation:  Close Post-procedure details:    Dressing:  Antibiotic ointment   Patient tolerance of procedure:  Tolerated well, no immediate complications   (including critical care time)    Julianne Rice, MD 07/24/19 [place], [place], MD 07/25/19 1952

## 2019-07-24 NOTE — ED Notes (Signed)
Patient transported to CT 

## 2019-07-24 NOTE — ED Notes (Signed)
Report given to Barbie, RN on 3E

## 2019-07-24 NOTE — ED Triage Notes (Addendum)
Pt arrived via [facility] from home. Pt was on toilet trying to have BM that was unsuccessful, pt went to sink and became dizzy and fell and hit head on floor. Pt denies LOC, pt denies blood thinners. Pt had 3 episodes of diarrhea in bathroom when EMS arrived. Pt has hx of stage 3 lung CA and they're changing his chemo drugs on Monday. Pt has 5cm laceration above left eye and ecchymosis of left orbital area. Pt is A&Ox4. 5/5 bilat grip strenght, 4/5 pedal push/pull. Pt denies numbness and tingling, blurred vision, HA. Pt has small skin tears on 1st and 3rd digit of right hand.

## 2019-07-24 NOTE — H&P (Signed)
History and Physical    AVISHAI [name] UMP:[id number] DOB: [birth year] DOA: 07/24/2019  PCP: [name], MD  Patient coming from: Home  I have personally briefly reviewed patient's old medical records in [place]  Chief Complaint: Syncope  HPI: Tony [name] is a 78 y.o. 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  Patient reports that he was in the bathroom on the commode and straining to have bowel movement and when he gets up to wash his hands he fell to the ground and hit on his head.  He had laceration on left side of his face. Denies headache, blurry vision, lightheadedness, dizziness, nausea, vomiting, chest pain, shortness of breath, palpitation prior to the episode.  He had loose bowel movement after the fall.  No history of diarrhea before, recent antibiotic use, recent travel, epigastric pain, nausea, vomiting, decreased appetite, melena or generalized weakness.  Patient lives with his wife and denies smoking, alcohol, illicit drug use.  He uses cane for ambulation.  His chemotherapy for lung cancer was held for couple of months due to fibrosis and he started on prednisone.  Patient's wife reports that patient is supposed to start his chemotherapy starting from this Monday.  In ED he was found to have new onset of A. Fib-patient reports that he is not on anticoagulation and does not want to be on anticoagulation as he developed ulcer/GI bleed after he started on blood thinners when he had heart attack.  ED Course: Upon arrival: Patient's blood pressure found to be on lower side.  Troponin: WNL.  Patient had 3 loose bowel movement in the ED.  Patient received IV fluid bolus in the ED CT head  and cervical spine came back negative for acute findings.  Positive for laceration and hematoma of left cheek.  Review of Systems: As per HPI otherwise negative.    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   HLD (hyperlipidemia) 10/13/2018   HTN (hypertension) 10/13/2018   Hyperlipidemia    Hypertension    Lung cancer ([place]) dx'd 07/2018   Mass of lower lobe of right lung    Myocardial infarction [facility])    MI in 2000   Peripheral vascular disease ([place])    Abdominal Aortic Aneurysm   Prostate cancer ([place]) Nov. 2012   Ulcer     Past Surgical History:  Procedure Laterality Date   ANAL FISSURECTOMY     COLONOSCOPY     CORONARY ARTERY BYPASS GRAFT     2000   EYE SURGERY Left Nov. 2013   Cataract   EYE SURGERY Right Feb. 2014   Cataract   HERNIA REPAIR     PORTACATH PLACEMENT Left 09/24/2018   Procedure: INSERTION PORT-A-CATH;  Surgeon: Melrose Nakayama, MD;  Location: [facility];  Service: Thoracic;  Laterality: Left;   PR VEIN BYPASS GRAFT,AORTO-FEM-POP  2000   PROSTATE SURGERY  Nov.  2012   ROBOT ASSISTED LAPAROSCOPIC RADICAL PROSTATECTOMY  08/08/2011   Procedure: ROBOTIC ASSISTED LAPAROSCOPIC RADICAL PROSTATECTOMY LEVEL 2;  Surgeon: Dutch Gray, MD;  Location: [facility];  Service: Urology;  Laterality: Bilateral;  with Bilateral Pelvic Lymphadenectomy    SHOULDER SURGERY  1980'S   VIDEO BRONCHOSCOPY WITH ENDOBRONCHIAL ULTRASOUND N/A 08/06/2018   Procedure: VIDEO BRONCHOSCOPY WITH ENDOBRONCHIAL ULTRASOUND;  Surgeon: Melrose Nakayama, MD;  Location: [facility];  Service: Thoracic;  Laterality: N/A;   WISDOM TOOTH EXTRACTION       reports that he quit smoking about 20  years ago. His smoking use included cigarettes. He has a 64.50 pack-year smoking history. He has never used smokeless tobacco. He reports current alcohol use of about 7.0 standard drinks of alcohol per week. He reports that he does not use drugs.  No Known Allergies  Family History  Problem Relation Age of Onset   Diabetes Mother    Heart disease Mother        CHF   Heart disease Father    Heart failure Father        Amputation- Leg   Deep vein thrombosis Father    Heart disease Brother        Heart Disease before age 63   Heart failure Brother    Hyperlipidemia Brother    Hypertension Brother    Heart disease Sister    Diabetes Sister    Heart failure Sister     Prior to Admission medications   Medication Sig Start Date End Date Taking? Authorizing Provider  acetaminophen (TYLENOL) 650 MG CR tablet Take 650 mg by mouth daily as needed for pain.     [provider]  aspirin 81 MG tablet Take 81 mg by mouth every evening.     [provider]  colchicine 0.6 MG tablet Take 0.6 mg by mouth daily.    [provider]  folic acid (FOLVITE) 371 MCG tablet Take 800 mcg by mouth daily.     [provider]  gabapentin (NEURONTIN) 100 MG capsule TAKE 2 CAPSULES BY MOUTH AT BEDTIME 02/13/19   Curt Bears, MD  metoprolol tartrate (LOPRESSOR) 25 MG tablet Take 1 tablet (25 mg total) by mouth 2 (two) times daily. 05/21/19   Jettie Booze, MD  Multiple Vitamins-Minerals (MULTIVITAMIN WITH MINERALS) tablet Take 1 tablet by mouth daily.    [provider]  niacin 500 MG tablet Take 500 mg by mouth 2 (two) times daily.    [provider]  Omega-3 Fatty Acids (FISH OIL) 1200 MG CAPS Take 1,200 mg by mouth daily.     [provider]  omeprazole (PRILOSEC OTC) 20 MG tablet Take 10 mg by mouth 2 (two) times a week.    [provider]  polyethylene glycol (MIRALAX / GLYCOLAX) packet Take 17 g by mouth daily.      [provider]  pravastatin (PRAVACHOL) 10 MG tablet Take 10 mg by mouth every evening.  07/29/18   [provider]  predniSONE (DELTASONE) 10 MG tablet 7 tablet p.o. daily for 1 week, 5 tablet p.o. daily for 1 week, followed by 3 tablet p.o. daily for 1 week followed by 2 tablets p.o. daily for 1 week followed by 1 tablet p.o. daily for 1 week and then stop. 05/18/19   Curt Bears, MD  ramipril (ALTACE) 10 MG capsule Take 10 mg by mouth every morning.     [provider]  triamterene-hydrochlorothiazide (MAXZIDE-25) 37.5-25 MG tablet Take  0.5 tablets by mouth every morning.     [provider]    Physical Exam: Vitals:   07/24/19 1347 07/24/19 1400 07/24/19 1450 07/24/19 1451  BP: (!) 117/97 (!) 106/56 94/69   Pulse: 62 61  63  Resp: 18 18  (!) 22  Temp: 97.7 F (36.5 C)     TempSrc: Rectal     SpO2: 99% 98%  99%  Weight:      Height:        Constitutional: NAD, calm, comfortable Eyes: PERRL, lids and conjunctivae normal ENMT: Mucous membranes are moist. Posterior pharynx clear of any exudate or lesions.Normal dentition.  Neck: normal, supple, no masses, no thyromegaly Respiratory: clear to auscultation bilaterally, no wheezing, no crackles. Normal respiratory effort. No accessory muscle use.  Cardiovascular: Regular rate and rhythm, no murmurs / rubs / gallops. No extremity edema. 2+ pedal pulses. No carotid bruits.  Abdomen: no tenderness, no masses palpated. No hepatosplenomegaly. Bowel sounds positive.  Musculoskeletal: no clubbing / cyanosis. No joint deformity upper and lower extremities. Good ROM, no contractures. Normal muscle tone.  Skin: Laceration noted on left side of head, above left eye.  Bruises noted on left cheek. Neurologic: CN 2-12 grossly intact. Sensation intact, DTR normal. Strength 5/5 in all 4.  Psychiatric: Normal judgment and insight. Alert and oriented x 3. Normal mood.    Labs on Admission: I have personally  reviewed following labs and imaging studies  CBC: Recent Labs  Lab 07/24/19 1349  WBC 10.3  HGB 12.3*  HCT 37.2*  MCV 106.6*  PLT 324   Basic Metabolic Panel: Recent Labs  Lab 07/24/19 1349  NA 137  K 4.0  CL 104  CO2 21*  GLUCOSE 125*  BUN 13  CREATININE 1.00  CALCIUM 8.8*   GFR: Estimated Creatinine Clearance: 59.6 mL/min (by C-G formula based on SCr of 1 mg/dL). Liver Function Tests: No results for input(s): AST, ALT, ALKPHOS, BILITOT, PROT, ALBUMIN in the last 168 hours. No results for input(s): LIPASE, AMYLASE in the last 168 hours. No results for input(s): AMMONIA in the last 168 hours. Coagulation Profile: No results for input(s): INR, PROTIME in the last 168 hours. Cardiac Enzymes: No results for input(s): CKTOTAL, CKMB, CKMBINDEX, TROPONINI in the last 168 hours. BNP (last 3 results) No results for input(s): PROBNP in the last 8760 hours. HbA1C: No results for input(s): HGBA1C in the last 72 hours. CBG: No results for input(s): GLUCAP in the last 168 hours. Lipid Profile: No results for input(s): CHOL, HDL, LDLCALC, TRIG, CHOLHDL, LDLDIRECT in the last 72 hours. Thyroid Function Tests: No results for input(s): TSH, T4TOTAL, FREET4, T3FREE, THYROIDAB in the last 72 hours. Anemia Panel: No results for input(s): VITAMINB12, FOLATE, FERRITIN, TIBC, IRON, RETICCTPCT in the last 72 hours. Urine analysis: No results found for: COLORURINE, APPEARANCEUR, LABSPEC, PHURINE, GLUCOSEU, HGBUR, BILIRUBINUR, KETONESUR, PROTEINUR, UROBILINOGEN, NITRITE, LEUKOCYTESUR  Radiological Exams on Admission: Dg Chest 2 View  Result Date: 07/24/2019 CLINICAL DATA:  Pt arrived via [facility] from home. Pt was on toilet trying to have BM that was unsuccessful, pt went to sink and became dizzy and fell and hit head on floor. Pt denies LOC, pt denies blood thinners. Pt states hx of stage 3 lung cancer, states they are changing his chemo meds on Monday. Pt hx MI, HTN, CAD. Pt is a former  smoker EXAM: CHEST - 2 VIEW COMPARISON:  09/24/2018 FINDINGS: Stable changes from prior CABG surgery. Cardiac silhouette is normal in size. No mediastinal  or hilar masses. Coarsely thickened interstitial markings bilaterally. There is hazy opacity at the right lung base partly obscuring the hemidiaphragm. Suspect a small effusion with atelectasis. No convincing pneumonia. No pneumothorax. Left anterior chest wall Port-A-Cath tip projects in the mid superior vena cava. Skeletal structures are grossly intact. IMPRESSION: 1. No acute findings. 2. Interstitial thickening consistent interstitial lung disease. 3. Mild opacity at the right lung base is likely due to a small effusion with atelectasis. Electronically Signed   By: Lajean Manes M.D.   On: 07/24/2019 14:49   Ct Head Wo Contrast  Result Date: 07/24/2019 CLINICAL DATA:  Fall, head injury, laceration above left eye EXAM: CT HEAD WITHOUT CONTRAST CT CERVICAL SPINE WITHOUT CONTRAST TECHNIQUE: Multidetector CT imaging of the head and cervical spine was performed following the standard protocol without intravenous contrast. Multiplanar CT image reconstructions of the cervical spine were also generated. COMPARISON:  10/14/2018 FINDINGS: CT HEAD FINDINGS Brain: No evidence of acute infarction, hemorrhage, hydrocephalus, extra-axial collection or mass lesion/mass effect. Periventricular white matter hypodensity. Vascular: No hyperdense vessel or unexpected calcification. Skull: Normal. Negative for fracture or focal lesion. Sinuses/Orbits: No acute finding. Other: Soft tissue laceration and hematoma of the left cheek and forehead. CT CERVICAL SPINE FINDINGS Alignment: Normal. Skull base and vertebrae: No acute fracture. No primary bone lesion or focal pathologic process. 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 Soft tissues and spinal canal: No prevertebral fluid or swelling. No visible canal hematoma. Disc levels:  Moderate disc space height loss and osteophytosis at C4 through C6. Disc spaces are otherwise preserved. Upper chest: Negative. Other: None. IMPRESSION: 1.  No acute intracranial pathology. 2. Soft tissue laceration and hematoma of the left cheek and forehead. 3.  No fracture or static subluxation of the cervical spine. Electronically Signed   By: Eddie Candle M.D.   On: 07/24/2019 14:52   Ct Cervical Spine Wo Contrast  Result Date: 07/24/2019 CLINICAL DATA:  Fall, head injury, laceration above left eye EXAM: CT HEAD WITHOUT CONTRAST CT CERVICAL SPINE WITHOUT CONTRAST TECHNIQUE: Multidetector CT imaging of the head and cervical spine was performed following the standard protocol without intravenous contrast. Multiplanar CT image reconstructions of the cervical spine were also generated. COMPARISON:  10/14/2018 FINDINGS: CT HEAD FINDINGS Brain: No evidence of acute infarction, hemorrhage, hydrocephalus, extra-axial collection or mass lesion/mass effect. Periventricular white matter hypodensity. Vascular: No hyperdense vessel or unexpected calcification. Skull: Normal. Negative for fracture or focal lesion. Sinuses/Orbits: No acute finding. Other: Soft tissue laceration and hematoma of the left cheek and forehead. CT CERVICAL SPINE FINDINGS Alignment: Normal. Skull base and vertebrae: No acute fracture. No primary bone lesion or focal pathologic process. 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 Soft tissues and spinal canal: No prevertebral fluid or swelling. No visible canal hematoma. Disc levels: Moderate disc space height loss and osteophytosis at C4 through C6. Disc spaces are otherwise preserved. Upper chest: Negative. Other: None. IMPRESSION: 1.  No acute intracranial pathology. 2. Soft tissue laceration and hematoma of the left cheek and forehead. 3.  No fracture or static subluxation of the cervical spine. Electronically Signed   By: Eddie Candle M.D.    On: 07/24/2019 14:52    EKG: Irregular rhythm noted consistent with A. fib.  Assessment/Plan Principal Problem:   Syncope Active Problems:   Adenocarcinoma, lung, right (HCC)   CAD (coronary artery disease)   Hx of CABG   HTN (  hypertension)   HLD (hyperlipidemia)   Macrocytosis   New onset atrial fibrillation (HCC)   Laceration of face   Syncope: -Likely secondary from dehydration secondary from orthostatic hypotension versus new onset A. Fib -CT head and cervical spine came back negative for acute findings. -Patient blood pressure was on lower side upon arrival.  Received IV fluids in the ED. -We will admit patient for close monitoring.  On telemetry.  Continue IV fluids.  Neurochecks. -We will check orthostatic vitals. -Monitor H&H and vitals closely. -We will consult physical therapy  New onset A. Fib: -Rate controlled.  On telemetry.  CHA2DS2-VASc: 4 -Patient does not want to start on anticoagulation-due to prior history of GI bleed. -We will check TSH and ordered transthoracic echo -We will monitor his heart rate closely. -We will hold BB/CCB-due to low blood pressure  Diarrhea: Started this morning -No recent use of antibiotics or travel. -We will check C. difficile, stool occult and stool culture -Continue IV fluids.  On contact isolation  Coronary artery disease status post CABG: Stable -Troponin initially is negative. -We will hold metoprolol for now.  Continue aspirin and statin.  Hypertension: -Blood pressure is on lower side -We will hold his home blood pressure medicines-metoprolol, ramipril, triamterene-HCTZ at this time. -We will monitor his blood pressure closely.  Stage IV adenocarcinoma of lung: S/p chemo and radiation therapy: -His chemotherapy was held for couple of months due to fibrosis and was started on prednisone daily. -Patient is supposed to start on chemo starting from this Monday  Laceration of face: On left side -Repaired in ED -We  will monitor his H&H. -Tylenol as needed for pain control.  Macrocytosis: -H&H is currently stable. -We will check B12, folate and TSH level.  History of prostate cancer: Status post prostatectomy  History of GI bleed/ulcer: We will continue Protonix.  DVT prophylaxis: TED/SCD Code Status: Full code Family Communication: Wife present at bedside.  Plan of care discussed with patient and his wife in length and they verbalized understanding and agreed with it. Disposition Plan: TBD Consults called: None Admission status: Inpatient  Mckinley Jewel MD [facility] Pager [phone]  If 7PM-7AM, please contact night-coverage [email or web address] Password [email or web address]  07/24/2019, 3:57 PM

## 2019-07-24 NOTE — ED Notes (Signed)
ED TO INPATIENT HANDOFF REPORT  ED Nurse Name and Phone #: Lorrin Goodell [phone]  S Name/Age/Gender Tony [name] 78 y.o. male Room/Bed: 022C/022C  Code Status   Code Status: Full Code  Home/SNF/Other Home Patient oriented to: self, place, time and situation Is this baseline? Yes   Triage Complete: Triage complete  Chief Complaint Fall  Triage Note Pt arrived via [facility] from home. Pt was on toilet trying to have BM that was unsuccessful, pt went to sink and became dizzy and fell and hit head on floor. Pt denies LOC, pt denies blood thinners. Pt had 3 episodes of diarrhea in bathroom when EMS arrived. Pt has hx of stage 3 lung CA and they're changing his chemo drugs on Monday. Pt has 5cm laceration above left eye and ecchymosis of left orbital area. Pt is A&Ox4. 5/5 bilat grip strenght, 4/5 pedal push/pull. Pt denies numbness and tingling, blurred vision, HA. Pt has small skin tears on 1st and 3rd digit of right hand.   Allergies No Known Allergies  Level of Care/Admitting Diagnosis ED Disposition    ED Disposition Condition [facility] Area: [place] [[id number]]  Level of Care: Telemetry Medical [104]  Covid Evaluation: Asymptomatic Screening Protocol (No Symptoms)  Diagnosis: Syncope [659935]  Admitting Physician: Mckinley Jewel [[id number]]  Attending Physician: Mckinley Jewel [[id number]]  Estimated length of stay: past midnight tomorrow  Certification:: I certify this patient will need inpatient services for at least 2 midnights  PT Class (Do Not Modify): Inpatient [101]  PT Acc Code (Do Not Modify): Private [1]       B Medical/Surgery History Past Medical History:  Diagnosis Date  . Anemia   . Brain metastases ([place])   . CAD (coronary artery disease)   . 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 Constipation   . Gout   . Heart disease   . History of blood transfusion   . History of radiation therapy 09/01/2018   brain,  cerebella (5 sites)/ 20 Gy in 1 fraction.  Marland Kitchen HLD (hyperlipidemia) 10/13/2018  . HTN (hypertension) 10/13/2018  . Hyperlipidemia   . Hypertension   . Lung cancer ([place]) dx'd 07/2018  . Mass of lower lobe of right lung   . Myocardial infarction ([place])    MI in 2000  . Peripheral vascular disease ([facility])    Abdominal Aortic Aneurysm  . Prostate cancer [facility]) Nov. 2012  . Ulcer    Past Surgical History:  Procedure Laterality Date  . ANAL FISSURECTOMY    . COLONOSCOPY    . CORONARY ARTERY BYPASS GRAFT     2000  . EYE SURGERY Left Nov. 2013   Cataract  . EYE SURGERY Right Feb. 2014   Cataract  . HERNIA REPAIR    . PORTACATH PLACEMENT Left 09/24/2018   Procedure: INSERTION PORT-A-CATH;  Surgeon: Melrose Nakayama, MD;  Location: [place];  Service: Thoracic;  Laterality: Left;  . PR VEIN BYPASS GRAFT,AORTO-FEM-POP  2000  . PROSTATE SURGERY  Nov. 2012  . ROBOT ASSISTED LAPAROSCOPIC RADICAL PROSTATECTOMY  08/08/2011   Procedure: ROBOTIC ASSISTED LAPAROSCOPIC RADICAL PROSTATECTOMY LEVEL 2;  Surgeon: Dutch Gray, MD;  Location: [facility];  Service: Urology;  Laterality: Bilateral;  with Bilateral Pelvic Lymphadenectomy   . SHOULDER SURGERY  1980'S  . VIDEO BRONCHOSCOPY WITH ENDOBRONCHIAL ULTRASOUND N/A 08/06/2018   Procedure: VIDEO BRONCHOSCOPY WITH ENDOBRONCHIAL ULTRASOUND;  Surgeon: Melrose Nakayama, MD;  Location: [place];  Service:  Thoracic;  Laterality: N/A;  . WISDOM TOOTH EXTRACTION       A IV Location/Drains/Wounds Patient Lines/Drains/Airways Status   Active Line/Drains/Airways    Name:   Placement date:   Placement time:   Site:   Days:   Implanted Port 09/24/18 Left Chest   09/24/18    1117    Chest   303   Peripheral IV 07/24/19 Left Forearm   07/24/19    -    Forearm   less than 1   Peripheral IV 07/24/19 Right Antecubital   07/24/19    1616    Antecubital   less than 1   Incision (Closed) 09/24/18 Chest Left   09/24/18    1131     303          Intake/Output Last 24  hours  Intake/Output Summary (Last 24 hours) at 07/24/2019 1637 Last data filed at 07/24/2019 1624 Gross per 24 hour  Intake 500 ml  Output -  Net 500 ml    Labs/Imaging Results for orders placed or performed during the hospital encounter of 07/24/19 (from the past 48 hour(s))  Basic metabolic panel     Status: Abnormal   Collection Time: 07/24/19  1:49 PM  Result Value Ref Range   Sodium 137 135 - 145 mmol/L   Potassium 4.0 3.5 - 5.1 mmol/L   Chloride 104 98 - 111 mmol/L   CO2 21 (L) 22 - 32 mmol/L   Glucose, Bld 125 (H) 70 - 99 mg/dL   BUN 13 8 - 23 mg/dL   Creatinine, Ser 1.00 0.61 - 1.24 mg/dL   Calcium 8.8 (L) 8.9 - 10.3 mg/dL   GFR calc non Af Amer >60 >60 mL/min   GFR calc Af Amer >60 >60 mL/min   Anion gap 12 5 - 15    Comment: Performed at [facility], [address], [place], [place] [address]  CBC     Status: Abnormal   Collection Time: 07/24/19  1:49 PM  Result Value Ref Range   WBC 10.3 4.0 - 10.5 K/uL   RBC 3.49 (L) 4.22 - 5.81 MIL/uL   Hemoglobin 12.3 (L) 13.0 - 17.0 g/dL   HCT 37.2 (L) 39.0 - 52.0 %   MCV 106.6 (H) 80.0 - 100.0 fL   MCH 35.2 (H) 26.0 - 34.0 pg   MCHC 33.1 30.0 - 36.0 g/dL   RDW 14.9 11.5 - 15.5 %   Platelets 242 150 - 400 K/uL   nRBC 0.0 0.0 - 0.2 %    Comment: Performed at [facility], [place] [address] [place], [place] [address]  Troponin I (High Sensitivity)     Status: None   Collection Time: 07/24/19  1:49 PM  Result Value Ref Range   Troponin I (High Sensitivity) 13 <18 ng/L    Comment: (NOTE) Elevated high sensitivity troponin I (hsTnI) values and significant  changes across serial measurements may suggest ACS but many other  chronic and acute conditions are known to elevate hsTnI results.  Refer to the "Links" section for chest pain algorithms and additional  guidance. Performed at [facility], [place] [address] [place], [place] [address]    Dg Chest 2 View  Result Date: 07/24/2019 CLINICAL DATA:  Pt  arrived via [facility] from home. Pt was on toilet trying to have BM that was unsuccessful, pt went to sink and became dizzy and fell and hit head on floor. Pt denies LOC, pt denies  blood thinners. Pt states hx of stage 3 lung cancer, states they are changing his chemo meds on Monday. Pt hx MI, HTN, CAD. Pt is a former smoker EXAM: CHEST - 2 VIEW COMPARISON:  09/24/2018 FINDINGS: Stable changes from prior CABG surgery. Cardiac silhouette is normal in size. No mediastinal or hilar masses. Coarsely thickened interstitial markings bilaterally. There is hazy opacity at the right lung base partly obscuring the hemidiaphragm. Suspect a small effusion with atelectasis. No convincing pneumonia. No pneumothorax. Left anterior chest wall Port-A-Cath tip projects in the mid superior vena cava. Skeletal structures are grossly intact. IMPRESSION: 1. No acute findings. 2. Interstitial thickening consistent interstitial lung disease. 3. Mild opacity at the right lung base is likely due to a small effusion with atelectasis. Electronically Signed   By: Lajean Manes M.D.   On: 07/24/2019 14:49   Ct Head Wo Contrast  Result Date: 07/24/2019 CLINICAL DATA:  Fall, head injury, laceration above left eye EXAM: CT HEAD WITHOUT CONTRAST CT CERVICAL SPINE WITHOUT CONTRAST TECHNIQUE: Multidetector CT imaging of the head and cervical spine was performed following the standard protocol without intravenous contrast. Multiplanar CT image reconstructions of the cervical spine were also generated. COMPARISON:  10/14/2018 FINDINGS: CT HEAD FINDINGS Brain: No evidence of acute infarction, hemorrhage, hydrocephalus, extra-axial collection or mass lesion/mass effect. Periventricular white matter hypodensity. Vascular: No hyperdense vessel or unexpected calcification. Skull: Normal. Negative for fracture or focal lesion. Sinuses/Orbits: No acute finding. Other: Soft tissue laceration and hematoma of the left cheek and forehead. CT CERVICAL SPINE  FINDINGS Alignment: Normal. Skull base and vertebrae: No acute fracture. No primary bone lesion or focal pathologic process. 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 Soft tissues and spinal canal: No prevertebral fluid or swelling. No visible canal hematoma. Disc levels: Moderate disc space height loss and osteophytosis at C4 through C6. Disc spaces are otherwise preserved. Upper chest: Negative. Other: None. IMPRESSION: 1.  No acute intracranial pathology. 2. Soft tissue laceration and hematoma of the left cheek and forehead. 3.  No fracture or static subluxation of the cervical spine. Electronically Signed   By: Eddie Candle M.D.   On: 07/24/2019 14:52   Ct Cervical Spine Wo Contrast  Result Date: 07/24/2019 CLINICAL DATA:  Fall, head injury, laceration above left eye EXAM: CT HEAD WITHOUT CONTRAST CT CERVICAL SPINE WITHOUT CONTRAST TECHNIQUE: Multidetector CT imaging of the head and cervical spine was performed following the standard protocol without intravenous contrast. Multiplanar CT image reconstructions of the cervical spine were also generated. COMPARISON:  10/14/2018 FINDINGS: CT HEAD FINDINGS Brain: No evidence of acute infarction, hemorrhage, hydrocephalus, extra-axial collection or mass lesion/mass effect. Periventricular white matter hypodensity. Vascular: No hyperdense vessel or unexpected calcification. Skull: Normal. Negative for fracture or focal lesion. Sinuses/Orbits: No acute finding. Other: Soft tissue laceration and hematoma of the left cheek and forehead. CT CERVICAL SPINE FINDINGS Alignment: Normal. Skull base and vertebrae: No acute fracture. No primary bone lesion or focal pathologic process. 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 Soft tissues and spinal canal: No prevertebral fluid or swelling. No visible canal hematoma. Disc levels: Moderate disc space height loss and osteophytosis  at C4 through C6. Disc spaces are otherwise preserved. Upper chest: Negative. Other: None. IMPRESSION: 1.  No acute intracranial pathology. 2. Soft tissue laceration and hematoma of the left cheek and forehead. 3.  No fracture or static subluxation of the cervical spine. Electronically Signed  By: Eddie Candle M.D.   On: 07/24/2019 14:52    Pending Labs Unresulted Labs (From admission, onward)    Start     Ordered   07/25/19 5027  Basic metabolic panel  Tomorrow morning,   R     07/24/19 1552   07/25/19 0500  CBC  Tomorrow morning,   R     07/24/19 1552   07/24/19 1626  SARS CORONAVIRUS 2 (TAT 6-24 HRS) Nasopharyngeal Nasopharyngeal Swab  (Asymptomatic/Tier 2)  Once,   STAT    Question Answer Comment  Is this test for diagnosis or screening Screening   Symptomatic for COVID-19 as defined by CDC No   Hospitalized for COVID-19 No   Admitted to ICU for COVID-19 No   Previously tested for COVID-19 No   Resident in a congregate (group) care setting No   Employed in healthcare setting No      07/24/19 1625   07/24/19 1554  Vitamin B12  Once,   STAT     07/24/19 1553   07/24/19 1554  Folate RBC  Once,   STAT     07/24/19 1553   07/24/19 1554  Stool culture (children & immunocomp patients)  Once,   STAT     07/24/19 1554   07/24/19 1552  Protime-INR  Once,   STAT     07/24/19 1551   07/24/19 1552  APTT  Once,   STAT     07/24/19 1552   07/24/19 1552  Urinalysis, Routine w reflex microscopic  Once,   STAT     07/24/19 1552   07/24/19 1551  Magnesium  Once,   STAT     07/24/19 1551   07/24/19 1551  Phosphorus  Once,   STAT     07/24/19 1551   07/24/19 1551  Hepatic function panel  Once,   STAT     07/24/19 1551   07/24/19 1551  TSH  Once,   STAT     07/24/19 1551   07/24/19 1534  C difficile quick scan w PCR reflex  (C Difficile quick screen w PCR reflex panel)  Once, for 24 hours,   STAT     07/24/19 1534   07/24/19 1534  Occult blood card to lab, stool RN will collect  Once,    STAT    Question:  Specimen to be collected by:  Answer:  RN will collect   07/24/19 1534   07/24/19 1520  Lactic acid, plasma  Once,   STAT     07/24/19 1519          Vitals/Pain Today's Vitals   07/24/19 1347 07/24/19 1400 07/24/19 1450 07/24/19 1451  BP: (!) 117/97 (!) 106/56 94/69   Pulse: 62 61  63  Resp: 18 18  (!) 22  Temp: 97.7 F (36.5 C)     TempSrc: Rectal     SpO2: 99% 98%  99%  Weight:      Height:      PainSc:        Isolation Precautions Enteric precautions (UV disinfection)  Medications Medications  0.9 %  sodium chloride infusion (has no administration in time range)  acetaminophen (TYLENOL) tablet 650 mg (has no administration in time range)    Or  acetaminophen (TYLENOL) suppository 650 mg (has no administration in time range)  ondansetron (ZOFRAN) tablet 4 mg (has no administration in time range)    Or  ondansetron (ZOFRAN) injection 4 mg (has no administration in time range)  sodium chloride 0.9 % bolus 500 mL (0 mLs Intravenous Stopped 07/24/19 1624)  sodium chloride 0.9 % bolus 1,000 mL (1,000 mLs Intravenous New Bag/Given 07/24/19 1617)  lidocaine (PF) (XYLOCAINE) 1 % injection 5 mL (5 mLs Intradermal Given 07/24/19 1624)    Mobility walks with device Moderate fall risk   Focused Assessments Cardiac Assessment Handoff:  Cardiac Rhythm: Bundle branch block, Other (Comment)(sinus arrythmia) No results found for: CKTOTAL, CKMB, CKMBINDEX, TROPONINI No results found for: DDIMER Does the Patient currently have chest pain? No      R Recommendations: See Admitting Provider Note  Report given to:   Additional Notes:

## 2019-07-25 ENCOUNTER — Inpatient Hospital Stay (HOSPITAL_COMMUNITY): Payer: Medicare Other

## 2019-07-25 DIAGNOSIS — R9431 Abnormal electrocardiogram [ECG] [EKG]: Secondary | ICD-10-CM

## 2019-07-25 LAB — BASIC METABOLIC PANEL
Anion gap: 8 (ref 5–15)
BUN: 13 mg/dL (ref 8–23)
CO2: 21 mmol/L — ABNORMAL LOW (ref 22–32)
Calcium: 8.1 mg/dL — ABNORMAL LOW (ref 8.9–10.3)
Chloride: 109 mmol/L (ref 98–111)
Creatinine, Ser: 0.91 mg/dL (ref 0.61–1.24)
GFR calc Af Amer: >60 mL/min (ref >60)
GFR calc non Af Amer: >60 mL/min (ref >60)
Glucose, Bld: 103 mg/dL — ABNORMAL HIGH (ref 70–99)
Potassium: 3.5 mmol/L (ref 3.5–5.1)
Sodium: 138 mmol/L (ref 135–145)

## 2019-07-25 LAB — CBC
HCT: 33.2 % — ABNORMAL LOW (ref 39.0–52.0)
Hemoglobin: 10.9 g/dL — ABNORMAL LOW (ref 13.0–17.0)
MCH: 34.6 pg — ABNORMAL HIGH (ref 26.0–34.0)
MCHC: 32.8 g/dL (ref 30.0–36.0)
MCV: 105.4 fL — ABNORMAL HIGH (ref 80.0–100.0)
Platelets: 220 10*3/uL (ref 150–400)
RBC: 3.15 MIL/uL — ABNORMAL LOW (ref 4.22–5.81)
RDW: 14.8 % (ref 11.5–15.5)
WBC: 7.8 10*3/uL (ref 4.0–10.5)
nRBC: 0 % (ref 0.0–0.2)

## 2019-07-25 LAB — ECHOCARDIOGRAM COMPLETE
Height: 69 in
Weight: 2480 oz

## 2019-07-25 MED ORDER — CHLORHEXIDINE GLUCONATE CLOTH 2 % EX PADS
6.0000 | MEDICATED_PAD | Freq: Every day | CUTANEOUS | Status: DC
  Administered 2019-07-25 – 2019-07-26 (×2): 6 via TOPICAL

## 2019-07-25 NOTE — Progress Notes (Addendum)
PROGRESS NOTE    Tony [name]  WRU:[id number] DOB: [birth year] DOA: 07/24/2019 PCP: [name], MD   Brief Narrative:  Tony [name] is a 78 y.o. 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 Patient reports that he was in the bathroom on the commode and straining to have bowel movement and when he gets up to wash his hands he fell to the ground and hit on his head.  He had laceration on left side of his face. Denied headache, blurry vision, lightheadedness, dizziness, nausea, vomiting, chest pain, shortness of breath, palpitation prior to the episode.  He had loose bowel movement after the fall.  No history of diarrhea before, recent antibiotic use, recent travel, epigastric pain, nausea, vomiting, decreased appetite, melena or generalized weakness. His chemotherapy for lung cancer was held for couple of months due to fibrosis and he started on prednisone.patient is supposed to start his chemotherapy starting from this Monday.  In ED he was found to have new onset of A. Fib.  No prior known history of it. he is not on anticoagulation and does not want to be on anticoagulation as he developed ulcer/GI bleed after he started on blood thinners when he had heart attack.  Upon arrival to ED, patient's blood pressure found to be on lower side.  Troponin: WNL.  Patient had 3 loose bowel movement in the ED.  Patient received IV fluid bolus in the ED CT head and cervical spine came back negative for acute findings.  Positive for laceration and hematoma of left cheek.  He was subsequently admitted to hospital service.  Assessment & Plan:   Principal Problem:   Syncope Active Problems:   Adenocarcinoma, lung, right (HCC)   CAD  (coronary artery disease)   Hx of CABG   HTN (hypertension)   HLD (hyperlipidemia)   Macrocytosis   New onset atrial fibrillation (HCC)   Laceration of face  Syncope: This is likely secondary to dehydration causing orthostatic hypotension vs vasovagal since he had to strain for bowel movement before passing out.  All could very well be due to atrial fibrillation which is newly diagnosed but unknown whether this is new onset or not.  His rates remain controlled.  His blood pressure also remains on the low side with systolic around 90 despite of holding his antihypertensives.  His blood pressure usually runs around 914 systolic while taking medications.  After talking to him, he tells me that he only drinks one 8 ounce water cup every day.  He clearly is dehydrated.  He is on normal saline 125 cc/h which I will continue.  PT OT to see him.  Echo pending.  New onset vs newly diagnosed A. Fib: -Rate controlled.  On telemetry.  CHA2DS2-VASc: 4 -Patient does not want to start on anticoagulation-due to prior history of GI bleed.  TSH normal.  Rate is controlled.  Does not need and cannot tolerate rate control medications due to low blood pressure.  Monitor on telemetry.  Diarrhea: Started yesterday.  Had 4 bowel movements.  No more diarrhea.  C. difficile negative.  Coronary artery disease status post CABG: Stable.  Asymptomatic.  Continue aspirin and statin.  History of hypertension but now hypotensive : Blood pressure 78G systolic.  This is despite of holding antihypertensives.  Continue to hold antihypertensives and continue IV fluids and monitor closely.    Stage IV adenocarcinoma of lung: S/p chemo and radiation therapy: -His chemotherapy was held for couple of months due to fibrosis and was started on prednisone daily. -Patient is supposed to start on chemo starting from this Monday.  When I discussed with patient about him possibly needing rehab.  His only concern was that his chemotherapy  might get delayed.  I then talked to on-call oncologist Dr.Gudena who mentioned that they cannot do his chemotherapy while inpatient and it is okay if chemo gets delayed for 2 to 3 weeks.  Laceration of face: On left side -Repaired in ED -Tylenol as needed for pain control.  Macrocytosis: -H&H is currently stable.  B12 normal.  Folate pending.  TSH normal.  History of prostate cancer: Status post prostatectomy  History of GI bleed/ulcer: No signs of bleeding.  Hemoglobin dropped from 12.3-10.9.  Continue PPI.  Repeat H&H tomorrow.  DVT prophylaxis: SCD Code Status: Full code Family Communication:  None present at bedside.  Plan of care discussed with patient in length and he verbalized understanding and agreed with it. Disposition Plan: TBD.  Pending PT OT assessment.  Estimated body mass index is 22.89 kg/m as calculated from the following:   Height as of this encounter: 5\' 9"  (1.753 m).   Weight as of this encounter: 70.3 kg.      Nutritional status:               Consultants:   None  Procedures:   None  Antimicrobials:   None   Subjective: Seen and examined.  Sitting in the chair in his room.  He is alert and oriented.  Denies any complaint other than some pain in the left facial laceration.  Denies any shortness of breath or dizziness.  Objective: Vitals:   07/24/19 1737 07/25/19 0000 07/25/19 0511 07/25/19 0516  BP: (!) 126/58 (!) 98/57 (!) 92/51 99/65  Pulse: 87 81 80   Resp: 20 20    Temp: 97.7 F (36.5 C) 98.1 F (36.7 C) 98.7 F (37.1 C)   TempSrc: Oral Oral Oral   SpO2:      Weight:      Height:        Intake/Output Summary (Last 24 hours) at 07/25/2019 1313 Last data filed at 07/25/2019 0900 Gross per 24 hour  Intake 1737.2 ml  Output 701 ml  Net 1036.2 ml   Filed Weights   07/24/19 1341  Weight: 70.3 kg    Examination:  General exam: Appears calm and comfortable  Respiratory system: Diminished at the bases, mostly in  the right base. Respiratory effort normal. Cardiovascular system: S1 & S2 heard, RRR. No JVD, murmurs, rubs, gallops or clicks. No pedal edema. Gastrointestinal system: Abdomen is nondistended, soft and nontender. No organomegaly or masses felt. Normal bowel sounds heard. Central nervous system: Alert and oriented. No focal neurological deficits. Extremities: Symmetric 5 x 5 power. Skin: Laceration on the left forehead. Psychiatry: Judgement and insight appear normal. Mood & affect appropriate.    Data Reviewed: I have personally reviewed following labs and imaging studies  CBC: Recent Labs  Lab 07/24/19 1349 07/25/19 0533  WBC 10.3 7.8  HGB 12.3* 10.9*  HCT 37.2* 33.2*  MCV 106.6* 105.4*  PLT 242 409   Basic Metabolic Panel: Recent Labs  Lab 07/24/19 1349 07/24/19 1510 07/25/19 0533  NA 137  --  138  K 4.0  --  3.5  CL 104  --  109  CO2 21*  --  21*  GLUCOSE 125*  --  103*  BUN 13  --  13  CREATININE 1.00  --  0.91  CALCIUM 8.8*  --  8.1*  MG  --  1.8  --   PHOS  --  3.7  --    GFR: Estimated Creatinine Clearance: 65.5 mL/min (by C-G formula based on SCr of 0.91 mg/dL). Liver Function Tests: Recent Labs  Lab 07/24/19 1510  AST 24  ALT 14  ALKPHOS 96  BILITOT 0.7  PROT 6.3*  ALBUMIN 2.5*   No results for input(s): LIPASE, AMYLASE in the last 168 hours. No results for input(s): AMMONIA in the last 168 hours. Coagulation Profile: Recent Labs  Lab 07/24/19 1510  INR 1.1   Cardiac Enzymes: No results for input(s): CKTOTAL, CKMB, CKMBINDEX, TROPONINI in the last 168 hours. BNP (last 3 results) No results for input(s): PROBNP in the last 8760 hours. HbA1C: No results for input(s): HGBA1C in the last 72 hours. CBG: No results for input(s): GLUCAP in the last 168 hours. Lipid Profile: No results for input(s): CHOL, HDL, LDLCALC, TRIG, CHOLHDL, LDLDIRECT in the last 72 hours. Thyroid Function Tests: Recent Labs    07/24/19 1610  TSH 1.592   Anemia  Panel: Recent Labs    07/24/19 1510  VITAMINB12 699   Sepsis Labs: Recent Labs  Lab 07/24/19 1610  LATICACIDVEN 1.5    Recent Results (from the past 240 hour(s))  C difficile quick scan w PCR reflex     Status: None   Collection Time: 07/24/19  3:42 PM   Specimen: STOOL  Result Value Ref Range Status   C Diff antigen NEGATIVE NEGATIVE Final   C Diff toxin NEGATIVE NEGATIVE Final   C Diff interpretation No C. difficile detected.  Final    Comment: Performed at [facility], [place] [address], [place], [place] [address]  SARS CORONAVIRUS 2 (TAT 6-24 HRS) Nasopharyngeal Nasopharyngeal Swab     Status: None   Collection Time: 07/24/19  4:32 PM   Specimen: Nasopharyngeal Swab  Result Value Ref Range Status   SARS Coronavirus 2 NEGATIVE NEGATIVE Final    Comment: (NOTE) SARS-CoV-2 target nucleic acids are NOT DETECTED. The SARS-CoV-2 RNA is generally detectable in upper and lower respiratory specimens during the acute phase of infection. Negative results do not preclude SARS-CoV-2 infection, do not rule out co-infections with other pathogens, and should not be used as the sole basis for treatment or other patient management decisions. Negative results must be combined with clinical observations, patient history, and epidemiological information. The expected result is Negative. Fact Sheet for Patients: SugarRoll.be Fact Sheet for Healthcare Providers: https://www.woods-mathews.com/ This test is not yet approved or cleared by the [place] FDA and  has been authorized for detection and/or diagnosis of SARS-CoV-2 by FDA under an Emergency Use Authorization (EUA). This EUA will remain  in effect (meaning this test can be used) for the duration of the COVID-19 declaration under Section 56 4(b)(1) of the Act, 21 U.S.C. section 360bbb-3(b)(1), unless the authorization is terminated or revoked sooner. Performed at [facility], [place] [address], [place], [place] [address]       Radiology Studies: Dg Chest 2 View  Result Date: 07/24/2019 CLINICAL DATA:  Pt arrived via [facility] from home. Pt was on toilet trying to have BM that was unsuccessful, pt went to sink and became dizzy and fell and hit head  on floor. Pt denies LOC, pt denies blood thinners. Pt states hx of stage 3 lung cancer, states they are changing his chemo meds on Monday. Pt hx MI, HTN, CAD. Pt is a former smoker EXAM: CHEST - 2 VIEW COMPARISON:  09/24/2018 FINDINGS: Stable changes from prior CABG surgery. Cardiac silhouette is normal in size. No mediastinal or hilar masses. Coarsely thickened interstitial markings bilaterally. There is hazy opacity at the right lung base partly obscuring the hemidiaphragm. Suspect a small effusion with atelectasis. No convincing pneumonia. No pneumothorax. Left anterior chest wall Port-A-Cath tip projects in the mid superior vena cava. Skeletal structures are grossly intact. IMPRESSION: 1. No acute findings. 2. Interstitial thickening consistent interstitial lung disease. 3. Mild opacity at the right lung base is likely due to a small effusion with atelectasis. Electronically Signed   By: Lajean Manes M.D.   On: 07/24/2019 14:49   Ct Head Wo Contrast  Result Date: 07/24/2019 CLINICAL DATA:  Fall, head injury, laceration above left eye EXAM: CT HEAD WITHOUT CONTRAST CT CERVICAL SPINE WITHOUT CONTRAST TECHNIQUE: Multidetector CT imaging of the head and cervical spine was performed following the standard protocol without intravenous contrast. Multiplanar CT image reconstructions of the cervical spine were also generated. COMPARISON:  10/14/2018 FINDINGS: CT HEAD FINDINGS Brain: No evidence of acute infarction, hemorrhage, hydrocephalus, extra-axial collection or mass lesion/mass effect. Periventricular white matter hypodensity. Vascular: No hyperdense vessel or unexpected calcification. Skull: Normal. Negative for fracture or focal  lesion. Sinuses/Orbits: No acute finding. Other: Soft tissue laceration and hematoma of the left cheek and forehead. CT CERVICAL SPINE FINDINGS Alignment: Normal. Skull base and vertebrae: No acute fracture. No primary bone lesion or focal pathologic process. 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 Soft tissues and spinal canal: No prevertebral fluid or swelling. No visible canal hematoma. Disc levels: Moderate disc space height loss and osteophytosis at C4 through C6. Disc spaces are otherwise preserved. Upper chest: Negative. Other: None. IMPRESSION: 1.  No acute intracranial pathology. 2. Soft tissue laceration and hematoma of the left cheek and forehead. 3.  No fracture or static subluxation of the cervical spine. Electronically Signed   By: Eddie Candle M.D.   On: 07/24/2019 14:52   Ct Cervical Spine Wo Contrast  Result Date: 07/24/2019 CLINICAL DATA:  Fall, head injury, laceration above left eye EXAM: CT HEAD WITHOUT CONTRAST CT CERVICAL SPINE WITHOUT CONTRAST TECHNIQUE: Multidetector CT imaging of the head and cervical spine was performed following the standard protocol without intravenous contrast. Multiplanar CT image reconstructions of the cervical spine were also generated. COMPARISON:  10/14/2018 FINDINGS: CT HEAD FINDINGS Brain: No evidence of acute infarction, hemorrhage, hydrocephalus, extra-axial collection or mass lesion/mass effect. Periventricular white matter hypodensity. Vascular: No hyperdense vessel or unexpected calcification. Skull: Normal. Negative for fracture or focal lesion. Sinuses/Orbits: No acute finding. Other: Soft tissue laceration and hematoma of the left cheek and forehead. CT CERVICAL SPINE FINDINGS Alignment: Normal. Skull base and vertebrae: No acute fracture. No primary bone lesion or focal pathologic process. 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 Soft tissues and  spinal canal: No prevertebral fluid or swelling. No visible canal hematoma. Disc levels: Moderate disc space height loss and osteophytosis at C4 through C6. Disc spaces are otherwise preserved. Upper chest: Negative. Other: None. IMPRESSION: 1.  No acute intracranial pathology. 2. Soft tissue laceration and hematoma of the left cheek and forehead. 3.  No fracture or static  subluxation of the cervical spine. Electronically Signed   By: Eddie Candle M.D.   On: 07/24/2019 14:52    Scheduled Meds:  aspirin EC  81 mg Oral QPC supper   Chlorhexidine Gluconate Cloth  6 each Topical Daily   folic acid  1 mg Oral QAC breakfast   gabapentin  200 mg Oral QHS   multivitamin with minerals  1 tablet Oral QAC breakfast   niacin  500 mg Oral BID   pravastatin  10 mg Oral QPC supper   Continuous Infusions:  sodium chloride 125 mL/hr at 07/25/19 0854     LOS: 1 day   Time spent: 32 minutes   Darliss Cheney, MD [facility]  07/25/2019, 1:13 PM   To contact the attending provider between 7A-7P or the covering provider during after hours 7P-7A, please log into the web site [email or web address] and use password [email or web address].

## 2019-07-25 NOTE — Progress Notes (Signed)
*  PRELIMINARY RESULTS* Echocardiogram 2D Echocardiogram has been performed.  Leavy Cella 07/25/2019, 2:46 PM

## 2019-07-25 NOTE — Evaluation (Signed)
Physical Therapy Evaluation Patient Details Name: Tony [name] MRN: [id number] DOB: 1940/12/26 Today's Date: 07/25/2019   History of Present Illness  Tony [name] is a 78 y.o. 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 Patient reports that he was in the bathroom on the commode and straining to have bowel movement and when he gets up to wash his hands he fell to the ground and hit on his head.  He had laceration on left side of his face. Denied headache, blurry vision, lightheadedness, dizziness, nausea, vomiting, chest pain, shortness of breath, palpitation prior to the episode.  He had loose bowel movement after the fall.  No history of diarrhea before, recent antibiotic use, recent travel, epigastric pain, nausea, vomiting, decreased appetite, melena or generalized weakness. His chemotherapy for lung cancer was held for couple of months due to fibrosis and he started on prednisone.patient is supposed to start his chemotherapy starting from this Monday. Pt in afib with low BP.   Clinical Impression  Pt admitted with above diagnosis. Pt was able to stand and pivot to the bed from the recliner. Pt was not orthostatic.  Pt did not feel like walking as he was fatigued as he had been up since 5 am per pt.  Pt needed steadying assist.  Pt has had 2 falls over the last few weeks. Feel that Short term SNF may benefit pt and pt agrees.   Pt currently with functional limitations due to the deficits listed below (see PT Problem List). Pt will benefit from skilled PT to increase their independence and safety with mobility to allow discharge to the venue listed below.      Follow Up Recommendations SNF;Supervision/Assistance - 24 hour     Equipment Recommendations  Rolling walker with 5" wheels    Recommendations for Other Services       Precautions / Restrictions Precautions Precautions: Fall Restrictions Weight Bearing Restrictions: No      Mobility  Bed Mobility Overal bed mobility: Needs Assistance Bed Mobility: Sit to Supine       Sit to supine: Min guard   General bed mobility comments: No assist needed to get back into bed  Transfers Overall transfer level: Needs assistance Equipment used: Rolling walker (2 wheeled) Transfers: Sit to/from [facility] Sit to Stand: Min guard Stand pivot transfers: Min guard       General transfer comment: Pt needed cues only to stand and pivot to bed from chair.   Ambulation/Gait                Stairs            Wheelchair Mobility    Modified Rankin (Stroke Patients Only)       Balance Overall balance assessment: Needs assistance Sitting-balance support: No upper extremity supported;Feet supported Sitting balance-Leahy Scale: Fair     Standing balance support: Bilateral upper extremity supported;During functional activity Standing balance-Leahy Scale: Poor Standing balance comment: Pt was able to stand with RW with min guard assist.                              Pertinent Vitals/Pain Pain Assessment: No/denies pain    Home Living Family/patient expects to be discharged to:: Private  residence Living Arrangements: Spouse/significant other Available Help at Discharge: Family;Available 24 hours/day(supervision only, cannot assist pt) Type of Home: House Home Access: Stairs to enter   [employer] of Steps: 1 Home Layout: One level Home Equipment: Cane - single point;Shower seat      Prior Function Level of Independence: Independent with assistive device(s);Needs assistance   Gait / Transfers Assistance Needed: used cane at home and was Modif I  ADL's / Homemaking Assistance Needed: B/D  self  Comments: has had 2 falls     Hand Dominance   Dominant Hand: Right    Extremity/Trunk Assessment   Upper Extremity Assessment Upper Extremity Assessment: Defer to OT evaluation    Lower Extremity Assessment Lower Extremity Assessment: Generalized weakness    Cervical / Trunk Assessment Cervical / Trunk Assessment: Normal  Communication      Cognition Arousal/Alertness: Awake/alert Behavior During Therapy: WFL for tasks assessed/performed Overall Cognitive Status: Within Functional Limits for tasks assessed                                        General Comments General comments (skin integrity, edema, etc.): Initial vitals sitting in chair - 51 bpm, 127/82; standing 132/105, 100 bpm; once in bed  134/69, 94 bpm.      Exercises     Assessment/Plan    PT Assessment Patient needs continued PT services  PT Problem List Decreased balance;Decreased activity tolerance;Decreased mobility;Decreased knowledge of use of DME;Decreased safety awareness;Decreased knowledge of precautions       PT Treatment Interventions DME instruction;Gait training;Functional mobility training;Therapeutic activities;Therapeutic exercise;Balance training;Patient/family education    PT Goals (Current goals can be found in the Care Plan section)  Acute Rehab PT Goals Patient Stated Goal: to go to SNF for therapy PT Goal Formulation: With patient Time For Goal Achievement: 08/08/19 Potential to Achieve Goals: Good    Frequency Min 3X/week   Barriers to discharge Decreased caregiver support wife cannot care for pt    Co-evaluation               AM-PAC PT "6 Clicks" Mobility  Outcome Measure Help needed turning from your back to your side while in a flat bed without using bedrails?: None Help needed moving from lying on your back to sitting on the side of a flat bed without using bedrails?: None Help needed moving to and from a bed to a chair (including a  wheelchair)?: A Little Help needed standing up from a chair using your arms (e.g., wheelchair or bedside chair)?: A Little Help needed to walk in hospital room?: A Lot Help needed climbing 3-5 steps with a railing? : A Lot 6 Click Score: 18    End of Session Equipment Utilized During Treatment: Gait belt Activity Tolerance: Patient limited by fatigue Patient left: in bed;with call bell/phone within reach Nurse Communication: Mobility status PT Visit Diagnosis: Unsteadiness on feet (R26.81);Muscle weakness (generalized) (M62.81)    Time: 2595-6387 PT Time Calculation (min) (ACUTE ONLY): 18 min   Charges:   PT Evaluation $PT Eval Moderate Complexity: 1 Mod          Edyn Popoca W,PT Acute Rehabilitation Services Pager:  [phone]  Office:  [place] 07/25/2019, 1:45 PM

## 2019-07-26 ENCOUNTER — Inpatient Hospital Stay: Payer: Medicare Other

## 2019-07-26 ENCOUNTER — Telehealth: Payer: Self-pay | Admitting: Internal Medicine

## 2019-07-26 DIAGNOSIS — R55 Syncope and collapse: Secondary | ICD-10-CM

## 2019-07-26 DIAGNOSIS — Z951 Presence of aortocoronary bypass graft: Secondary | ICD-10-CM

## 2019-07-26 DIAGNOSIS — I4891 Unspecified atrial fibrillation: Secondary | ICD-10-CM

## 2019-07-26 DIAGNOSIS — I2510 Atherosclerotic heart disease of native coronary artery without angina pectoris: Secondary | ICD-10-CM

## 2019-07-26 LAB — CBC WITH DIFFERENTIAL/PLATELET
Abs Immature Granulocytes: 0.07 10*3/uL (ref 0.00–0.07)
Basophils Absolute: 0 10*3/uL (ref 0.0–0.1)
Basophils Relative: 1 %
Eosinophils Absolute: 0.4 10*3/uL (ref 0.0–0.5)
Eosinophils Relative: 5 %
HCT: 30 % — ABNORMAL LOW (ref 39.0–52.0)
Hemoglobin: 9.9 g/dL — ABNORMAL LOW (ref 13.0–17.0)
Immature Granulocytes: 1 %
Lymphocytes Relative: 19 %
Lymphs Abs: 1.3 10*3/uL (ref 0.7–4.0)
MCH: 34.4 pg — ABNORMAL HIGH (ref 26.0–34.0)
MCHC: 33 g/dL (ref 30.0–36.0)
MCV: 104.2 fL — ABNORMAL HIGH (ref 80.0–100.0)
Monocytes Absolute: 0.6 10*3/uL (ref 0.1–1.0)
Monocytes Relative: 9 %
Neutro Abs: 4.6 10*3/uL (ref 1.7–7.7)
Neutrophils Relative %: 65 %
Platelets: 190 10*3/uL (ref 150–400)
RBC: 2.88 MIL/uL — ABNORMAL LOW (ref 4.22–5.81)
RDW: 14.7 % (ref 11.5–15.5)
WBC: 7 10*3/uL (ref 4.0–10.5)
nRBC: 0 % (ref 0.0–0.2)

## 2019-07-26 LAB — BASIC METABOLIC PANEL
Anion gap: 8 (ref 5–15)
BUN: 13 mg/dL (ref 8–23)
CO2: 19 mmol/L — ABNORMAL LOW (ref 22–32)
Calcium: 7.7 mg/dL — ABNORMAL LOW (ref 8.9–10.3)
Chloride: 110 mmol/L (ref 98–111)
Creatinine, Ser: 0.91 mg/dL (ref 0.61–1.24)
GFR calc Af Amer: >60 mL/min (ref >60)
GFR calc non Af Amer: >60 mL/min (ref >60)
Glucose, Bld: 103 mg/dL — ABNORMAL HIGH (ref 70–99)
Potassium: 3.1 mmol/L — ABNORMAL LOW (ref 3.5–5.1)
Sodium: 137 mmol/L (ref 135–145)

## 2019-07-26 LAB — FOLATE RBC
Folate, Hemolysate: >620 ng/mL
Folate, RBC: >1867 ng/mL (ref >498)
Hematocrit: 33.2 % — ABNORMAL LOW (ref 37.5–51.0)

## 2019-07-26 LAB — MAGNESIUM: Magnesium: 1.5 mg/dL — ABNORMAL LOW (ref 1.7–2.4)

## 2019-07-26 LAB — OCCULT BLOOD X 1 CARD TO LAB, STOOL: Fecal Occult Bld: POSITIVE — AB

## 2019-07-26 MED ORDER — POTASSIUM CHLORIDE CRYS ER 20 MEQ PO TBCR
40.0000 meq | EXTENDED_RELEASE_TABLET | ORAL | Status: AC
  Administered 2019-07-26 (×2): 40 meq via ORAL
  Filled 2019-07-26 (×2): qty 2

## 2019-07-26 MED ORDER — INFLUENZA VAC A&B SA ADJ QUAD 0.5 ML IM PRSY
0.5000 mL | PREFILLED_SYRINGE | INTRAMUSCULAR | Status: AC
  Administered 2019-07-27: 0.5 mL via INTRAMUSCULAR
  Filled 2019-07-26: qty 0.5

## 2019-07-26 MED ORDER — MAGNESIUM SULFATE 2 GM/50ML IV SOLN
2.0000 g | Freq: Once | INTRAVENOUS | Status: AC
  Administered 2019-07-26: 2 g via INTRAVENOUS
  Filled 2019-07-26: qty 50

## 2019-07-26 NOTE — Consult Note (Addendum)
Cardiology Consultation:   Patient ID: Tony [name] MRN: [id number]; DOB: [birth year]  Admit date: 07/24/2019 Date of Consult: 07/26/2019  Primary Care Provider: [name], MD Primary Cardiologist: Larae Grooms, MD Primary Electrophysiologist:  None    Patient Profile:   Tony [name] is a 78 y.o. male with a hx of HTN, HL, stage IV adenocarcinoma of lung, CAD s/p 4vCABG, anemia, PVD and AAA who is being seen today for the evaluation of Afib/syncope at the request of Dr. Roderic Palau.  History of Present Illness:   Tony [name] is a 78 yo male with PMH noted above. Reports in 2000 he had a PTCA and one month later had 4v CABG, denies any coronary angiography since that time. States during that admission for his CABG he developed a GI and had a scope done by Dr. Cristina Gong. Cleared to remain on ASA. He was followed by Dr. Wynonia Lawman as an outpatient until his retirement, then transferred his care to [facility] He was last seen in the office on 05/21/2019 by Dr. Irish Lack. He was continued on his home medications with the exception of decreasing his blood pressure meds to metoprolol 25mg  BID as his bp was low in the office. He is also followed by Dr. Earlie Server as for his Ca. 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 Large right lower lobe lung mass with mediastinal lymphadenopathy, metastatic disease to the adrenal glands as well as right shoulder, lumbar spine and pelvic bones. He is currently undergoing palliative systemic chemotherapy. He was last seen in the office by Dr. Inda Merlin on 07/19/19 who noted unfortunately his recent imaging showed evidence for disease progression in the lung. The patient was advised of his options and chose to continue with current treatment regimen. He was scheduled for next cycle of chemotherapy on 11/2.   Reports being in his usual state of health until Saturday. Has had much trouble with his bowels in the past, and  struggles with constipation. Says he was sitting on the toilet straining to have a bowel but unable to to do so. Noted some bright red blood in the toilet but does have hemorrhoids. Went to stand up and wash his hands when he passed out. He does remember starting to fall, but was then on the floor. Wife was home and called 911. He denies any chest pain, palpitations, shortness of breath or light headedness prior to event.   In the ED he was found to have a large laceration to the left side of his forehead requiring sutures. Labs showed stable electrolytes, WBC 10.3, Hgb 12.3, HsT 13x2. EKG showed new onset Afib rate controlled with PVC and IVCD. CT head and spine were negative for acute abnormalities. Admitted to Internal Medicine for further work up. In review of telemetry he has converted to SR and now maintaining with intermittent PVCs.    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   HLD (hyperlipidemia) 10/13/2018   HTN (hypertension) 10/13/2018   Hyperlipidemia    Hypertension    Lung cancer ([place]) dx'd 07/2018  Mass of lower lobe of right lung    Myocardial infarction [facility])    MI in 2000   Peripheral vascular disease ([place])    Abdominal Aortic Aneurysm   Prostate cancer ([place]) Nov. 2012   Ulcer     Past Surgical History:  Procedure Laterality Date   ANAL FISSURECTOMY     COLONOSCOPY     CORONARY ARTERY BYPASS GRAFT     2000   EYE SURGERY Left Nov. 2013   Cataract   EYE SURGERY Right Feb. 2014   Cataract   HERNIA REPAIR     PORTACATH PLACEMENT Left 09/24/2018   Procedure: INSERTION PORT-A-CATH;  Surgeon: Melrose Nakayama, MD;  Location: [facility];   Service: Thoracic;  Laterality: Left;   PR VEIN BYPASS GRAFT,AORTO-FEM-POP  2000   PROSTATE SURGERY  Nov. 2012   ROBOT ASSISTED LAPAROSCOPIC RADICAL PROSTATECTOMY  08/08/2011   Procedure: ROBOTIC ASSISTED LAPAROSCOPIC RADICAL PROSTATECTOMY LEVEL 2;  Surgeon: Dutch Gray, MD;  Location: [facility];  Service: Urology;  Laterality: Bilateral;  with Bilateral Pelvic Lymphadenectomy    SHOULDER SURGERY  1980'S   VIDEO BRONCHOSCOPY WITH ENDOBRONCHIAL ULTRASOUND N/A 08/06/2018   Procedure: VIDEO BRONCHOSCOPY WITH ENDOBRONCHIAL ULTRASOUND;  Surgeon: Melrose Nakayama, MD;  Location: [facility];  Service: Thoracic;  Laterality: N/A;   WISDOM TOOTH EXTRACTION       Home Medications:  Prior to Admission medications   Medication Sig Start Date End Date Taking? Authorizing Provider  aspirin EC 81 MG tablet Take 81 mg by mouth daily after supper.   Yes [provider]  colchicine 0.6 MG tablet Take 0.6 mg by mouth daily as needed (gout attacks).    Yes [provider]  folic acid (FOLVITE) 629 MCG tablet Take 800 mcg by mouth daily before breakfast.    Yes [provider]  gabapentin (NEURONTIN) 100 MG capsule TAKE 2 CAPSULES BY MOUTH AT BEDTIME Patient taking differently: Take 200 mg by mouth at bedtime.  02/13/19  Yes Curt Bears, MD  metoprolol tartrate (LOPRESSOR) 25 MG tablet Take 1 tablet (25 mg total) by mouth 2 (two) times daily. Patient taking differently: Take 25 mg by mouth 2 (two) times daily with a meal.  05/21/19  Yes Jettie Booze, MD  Multiple Vitamins-Minerals (MULTIVITAMIN WITH MINERALS) tablet Take 1 tablet by mouth daily before breakfast.    Yes [provider]  niacin 500 MG tablet Take 500 mg by mouth 2 (two) times daily.   Yes [provider]  Omega-3 Fatty Acids (FISH OIL) 1200 MG CAPS Take 1,200 mg by mouth daily before breakfast.    Yes [provider]  omeprazole (PRILOSEC OTC) 20 MG tablet Take 20 mg by mouth daily  as needed (acid reflux).    Yes [provider]  polyethylene glycol (MIRALAX / GLYCOLAX) packet Take 17 g by mouth daily as needed for mild constipation.    Yes [provider]  pravastatin (PRAVACHOL) 10 MG tablet Take 10 mg by mouth daily after supper.  07/29/18  Yes [provider]  ramipril (ALTACE) 10 MG capsule Take 10 mg by mouth daily before breakfast.    Yes [provider]  triamterene-hydrochlorothiazide (MAXZIDE-25) 37.5-25 MG tablet Take 0.5 tablets by mouth daily before breakfast.    Yes [provider]    Inpatient Medications: Scheduled Meds:  aspirin EC  81 mg Oral QPC supper   Chlorhexidine Gluconate Cloth  6 each Topical Daily   folic acid  1 mg Oral  QAC breakfast   gabapentin  200 mg Oral QHS   [START ON 07/27/2019] influenza vaccine adjuvanted  0.5 mL Intramuscular Tomorrow-1000   multivitamin with minerals  1 tablet Oral QAC breakfast   niacin  500 mg Oral BID   potassium chloride  40 mEq Oral Q3H   pravastatin  10 mg Oral QPC supper   Continuous Infusions:  magnesium sulfate bolus IVPB     PRN Meds: acetaminophen **OR** acetaminophen, ondansetron **OR** ondansetron (ZOFRAN) IV, pantoprazole, polyethylene glycol  Allergies:   No Known Allergies  Social History:   Social History   Socioeconomic History   Marital status: Married    Spouse name: Not on file   Number of children: Not on file   Years of education: Not on file   Highest education level: Not on file  Occupational History   Not on file  Social Needs   Financial resource strain: Not on file   Food insecurity    Worry: Not on file    Inability: Not on file   Transportation needs    Medical: Not on file    Non-medical: Not on file  Tobacco Use   Smoking status: Former Smoker    Packs/day: 1.50    Years: 43.00    Pack years: 64.50    Types: Cigarettes    Quit date: 09/23/1998    Years since quitting: 20.8   Smokeless tobacco:  Never Used  Substance and Sexual Activity   Alcohol use: Yes    Alcohol/week: 7.0 standard drinks    Types: 7 Glasses of wine per week    Comment: half glass per day   Drug use: No   Sexual activity: Not on file  Lifestyle   Physical activity    Days per week: Not on file    Minutes per session: Not on file   Stress: Not on file  Relationships   Social connections    Talks on phone: Not on file    Gets together: Not on file    Attends religious service: Not on file    Active member of club or organization: Not on file    Attends meetings of clubs or organizations: Not on file    Relationship status: Not on file   Intimate partner violence    Fear of current or ex partner: Not on file    Emotionally abused: Not on file    Physically abused: Not on file    Forced sexual activity: Not on file  Other Topics Concern   Not on file  Social History Narrative   Not on file    Family History:    Family History  Problem Relation Age of Onset   Diabetes Mother    Heart disease Mother        CHF   Heart disease Father    Heart failure Father        Amputation- Leg   Deep vein thrombosis Father    Heart disease Brother        Heart Disease before age 51   Heart failure Brother    Hyperlipidemia Brother    Hypertension Brother    Heart disease Sister    Diabetes Sister    Heart failure Sister      ROS:  Please see the history of present illness.   All other ROS reviewed and negative.     Physical Exam/Data:   Vitals:   07/25/19 2042 07/26/19 0041 07/26/19 0504 07/26/19 1228  BP:  133/64  (!) 99/58 130/62  Pulse: 98  84 98  Resp: 18  16 18   Temp: 97.7 F (36.5 C)  98 F (36.7 C) 98.2 F (36.8 C)  TempSrc: Oral  Oral Oral  SpO2: 98%  97% 97%  Weight:  73.2 kg    Height:        Intake/Output Summary (Last 24 hours) at 07/26/2019 1502 Last data filed at 07/26/2019 0700 Gross per 24 hour  Intake 2909.51 ml  Output 300 ml  Net 2609.51 ml    Last 3 Weights 07/26/2019 07/24/2019 07/19/2019  Weight (lbs) 161 lb 6.4 oz 155 lb 155 lb 9.6 oz  Weight (kg) 73.211 kg 70.308 kg 70.58 kg     Body mass index is 23.83 kg/m.  General:  Well nourished, well developed, in no acute distress HEENT: Lac with sutures to left forehead Neck: no JVD Endocrine:  No thryomegaly Vascular: No carotid bruits Cardiac:  normal S1, S2; RRR; no murmur Lungs:  clear to auscultation bilaterally, no wheezing, rhonchi or rales  Abd: soft, nontender, no hepatomegaly  Ext: no edema Musculoskeletal:  No deformities, BUE and BLE strength normal and equal Skin: warm and dry  Neuro:  CNs 2-12 intact, no focal abnormalities noted Psych:  Normal affect   EKG:  The EKG was personally reviewed and demonstrates:  Afib rate controlled, PVC with IVCD Telemetry:  Telemetry was personally reviewed and demonstrates:  SR with PVCs  Relevant CV Studies:  TTE: 07/25/19  IMPRESSIONS    1. Technically difficult study. Images are not adequate to assess regional wall motion, consider repeat lmited TTE with contrast.  2. Left ventricular ejection fraction, by visual estimation, is 40 to 45%. The left ventricle has mild to moderately decreased function. There is mildly increased left ventricular hypertrophy. Appears to be basal to mid inferolateral/inferior  hypokinesis  3. Left ventricular diastolic parameters are consistent with Grade I diastolic dysfunction (impaired relaxation).  4. Left atrial size was normal.  5. Right atrial size was normal.  6. The mitral valve is normal in structure. No evidence of mitral valve regurgitation.  7. The tricuspid valve is not well visualized. Tricuspid valve regurgitation is not demonstrated.  8. The aortic valve was not well visualized. Aortic valve regurgitation is not visualized.  9. The pulmonic valve was not well visualized. Pulmonic valve regurgitation is not visualized. 10. The inferior vena cava is normal in size with  greater than 50% respiratory variability, suggesting right atrial pressure of 3 mmHg. 11. Global right ventricle has normal systolic function.The right ventricular size is normal. No increase in right ventricular wall thickness. 12. There is mild dilatation of the aortic root measuring 38 mm.  Laboratory Data:  High Sensitivity Troponin:   Recent Labs  Lab 07/24/19 1349 07/24/19 1510  TROPONINIHS 13 13     Chemistry Recent Labs  Lab 07/24/19 1349 07/25/19 0533 07/26/19 0641  NA 137 138 137  K 4.0 3.5 3.1*  CL 104 109 110  CO2 21* 21* 19*  GLUCOSE 125* 103* 103*  BUN 13 13 13   CREATININE 1.00 0.91 0.91  CALCIUM 8.8* 8.1* 7.7*  GFRNONAA >60 >60 >60  GFRAA >60 >60 >60  ANIONGAP 12 8 8     Recent Labs  Lab 07/24/19 1510  PROT 6.3*  ALBUMIN 2.5*  AST 24  ALT 14  ALKPHOS 96  BILITOT 0.7   Hematology Recent Labs  Lab 07/24/19 1349 07/25/19 0533 07/26/19 0641  WBC 10.3 7.8 7.0  RBC 3.49*  3.15* 2.88*  HGB 12.3* 10.9* 9.9*  HCT 37.2* 33.2* 30.0*  MCV 106.6* 105.4* 104.2*  MCH 35.2* 34.6* 34.4*  MCHC 33.1 32.8 33.0  RDW 14.9 14.8 14.7  PLT 242 220 190   BNPNo results for input(s): BNP, PROBNP in the last 168 hours.  DDimer No results for input(s): DDIMER in the last 168 hours.   Radiology/Studies:  Dg Chest 2 View  Result Date: 07/24/2019 CLINICAL DATA:  Pt arrived via [facility] from home. Pt was on toilet trying to have BM that was unsuccessful, pt went to sink and became dizzy and fell and hit head on floor. Pt denies LOC, pt denies blood thinners. Pt states hx of stage 3 lung cancer, states they are changing his chemo meds on Monday. Pt hx MI, HTN, CAD. Pt is a former smoker EXAM: CHEST - 2 VIEW COMPARISON:  09/24/2018 FINDINGS: Stable changes from prior CABG surgery. Cardiac silhouette is normal in size. No mediastinal or hilar masses. Coarsely thickened interstitial markings bilaterally. There is hazy opacity at the right lung base partly obscuring the  hemidiaphragm. Suspect a small effusion with atelectasis. No convincing pneumonia. No pneumothorax. Left anterior chest wall Port-A-Cath tip projects in the mid superior vena cava. Skeletal structures are grossly intact. IMPRESSION: 1. No acute findings. 2. Interstitial thickening consistent interstitial lung disease. 3. Mild opacity at the right lung base is likely due to a small effusion with atelectasis. Electronically Signed   By: Lajean Manes M.D.   On: 07/24/2019 14:49   Ct Head Wo Contrast  Result Date: 07/24/2019 CLINICAL DATA:  Fall, head injury, laceration above left eye EXAM: CT HEAD WITHOUT CONTRAST CT CERVICAL SPINE WITHOUT CONTRAST TECHNIQUE: Multidetector CT imaging of the head and cervical spine was performed following the standard protocol without intravenous contrast. Multiplanar CT image reconstructions of the cervical spine were also generated. COMPARISON:  10/14/2018 FINDINGS: CT HEAD FINDINGS Brain: No evidence of acute infarction, hemorrhage, hydrocephalus, extra-axial collection or mass lesion/mass effect. Periventricular white matter hypodensity. Vascular: No hyperdense vessel or unexpected calcification. Skull: Normal. Negative for fracture or focal lesion. Sinuses/Orbits: No acute finding. Other: Soft tissue laceration and hematoma of the left cheek and forehead. CT CERVICAL SPINE FINDINGS Alignment: Normal. Skull base and vertebrae: No acute fracture. No primary bone lesion or focal pathologic process. 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 Soft tissues and spinal canal: No prevertebral fluid or swelling. No visible canal hematoma. Disc levels: Moderate disc space height loss and osteophytosis at C4 through C6. Disc spaces are otherwise preserved. Upper chest: Negative. Other: None. IMPRESSION: 1.  No acute intracranial pathology. 2. Soft tissue laceration and hematoma of the left cheek and forehead. 3.  No fracture or static  subluxation of the cervical spine. Electronically Signed   By: Eddie Candle M.D.   On: 07/24/2019 14:52   Ct Cervical Spine Wo Contrast  Result Date: 07/24/2019 CLINICAL DATA:  Fall, head injury, laceration above left eye EXAM: CT HEAD WITHOUT CONTRAST CT CERVICAL SPINE WITHOUT CONTRAST TECHNIQUE: Multidetector CT imaging of the head and cervical spine was performed following the standard protocol without intravenous contrast. Multiplanar CT image reconstructions of the cervical spine were also generated. COMPARISON:  10/14/2018 FINDINGS: CT HEAD FINDINGS Brain: No evidence of acute infarction, hemorrhage, hydrocephalus, extra-axial collection or mass lesion/mass effect. Periventricular white matter hypodensity. Vascular: No hyperdense vessel or unexpected calcification. Skull: Normal. Negative for fracture or focal lesion. Sinuses/Orbits: No acute finding. Other: Soft tissue  laceration and hematoma of the left cheek and forehead. CT CERVICAL SPINE FINDINGS Alignment: Normal. Skull base and vertebrae: No acute fracture. No primary bone lesion or focal pathologic process. 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 Soft tissues and spinal canal: No prevertebral fluid or swelling. No visible canal hematoma. Disc levels: Moderate disc space height loss and osteophytosis at C4 through C6. Disc spaces are otherwise preserved. Upper chest: Negative. Other: None. IMPRESSION: 1.  No acute intracranial pathology. 2. Soft tissue laceration and hematoma of the left cheek and forehead. 3.  No fracture or static subluxation of the cervical spine. Electronically Signed   By: Eddie Candle M.D.   On: 07/24/2019 14:52   Assessment and Plan:   NYSHAWN GOWDY is a 78 y.o. male with a hx of HTN, HL, stage IV adenocarcinoma of lung, CAD s/p 4vCABG, anemia, PVD and AAA who is being seen today for the evaluation of Afib/syncope at the request of Dr. Roderic Palau.  1. Syncope: seems  vasovagal in nature, likely worsened by dehydration. Was straining to have a bowel and passed out when he stood up. Blood pressures were soft on admission. Improved with IVFs. Last office note stated for him to decrease his metoprolol to 25mg  BID 2/2 to soft bps and consider stopping Altace if remained low. Currently off all blood pressure meds. Using compression stockings.   2. New onset Afib: noted on admission, rate controlled. He has converted to SR and now maintaining. This patients CHA2DS2-VASc Score of at least 4. Discussed options regarding [facility] with him but he is very hesitant to consider given his GI bleed around time of his CABG and Ca. He is chronically anemic as well, last transfusion was about 3 months prior. Platelet count is stable. Given he is back in SR will hold on adding anticoagulation for now. May need oncology input as well. TSH normal. -- plan for cardiac monitor at DC to determine Afib recurrence.   3. Stage IV lung Ca with metastasis: followed by Dr. Inda Merlin. Currently on palliative chemotherapy. Was actually planned for today, but will be rescheduled.   4. CAD s/p 4vCABG: has been maintained on ASA and statin prior to admission. No chest pain. Negative HsT  5. Anemia with Hx of GI bleed: Hgb 12.3>>9.9 but suspect some dilution as he was given IVFs. Does report intermittent episodes of bright red blood but has hemorrhoids.     6. Hypokalemia/hypoymag: K+3.1, Mag 1.5 this morning. Replete per primary  For questions or updates, please contact [place] Please consult [email or web address] for contact info under   Signed, Reino Bellis, NP  07/26/2019 3:02 PM    Patient seen and examined. Agree with assessment and plan.  Tony [name] is a 78 year old patient who is a former patient of Dr. Wynonia Lawman and has seen Dr. Irish Lack since Dr. Wynonia Lawman has retired.  Tony [name] has history of hypertension, hyperlipidemia, metastatic stage IV adenocarcinoma of the lung which has  progressed, and is status post remote CABG revascularization surgery in 2000.  Apparently during that hospitalization he had issues with internal bleeding.  He has a history of anemia and has undergone blood cell transfusions with his last he believes 3 months ago.  He also has PVD and an abdominal aortic aneurysm.  Today after straining for a bowel movement upon standing up he became lightheaded and apparently had a syncopal spell.  He developed significant trauma to his left side of his face and  required 11 stitches and sustained significant ecchymosis.  He was found to be in atrial fibrillation with a controlled ventricular rate at 65 and ECG shows isolated PVC with slight prolongation of QTc interval at 465 ms.  Laboratory revealed hypokalemia with a potassium of 3.1 and hypomagnesemia with a magnesium of 1.5.  He subsequently has converted to sinus rhythm.  He denies any prodrome of anginal symptomatology.  Will obtain follow-up ECG following conversion to sinus rhythm.  An echo Doppler study has demonstrated an EF estimated 40 to 45% with mild LVH and basal to mid inferolateral to inferior hypocontractility.  There was grade 1 diastolic dysfunction.  Aortic root  measured 38 mm.  Exam is notable for left eye conjunctival erythema, ecchymoses around his left orbital area with sutures above the eye/forehead.  He has a thick neck.  I did not appreciate any carotid bruits.  Lungs were relatively clear without wheezing.  Rhythm was regular with a 1/6 systolic murmur.  Abdomen was soft nontender without hepatosplenomegaly.  Pulses were adequate.  There was no edema.  Neurologic exam was grossly nonfocal.  With his recent trauma and prompt cardioversion back to sinus rhythm, we will not anticoagulate.  Will need to follow-up hemoglobin/hematocrit.  He was supposed to undergo scheduled additional chemotherapy today but this not done secondary to his current admission.  Will monitor blood pressure and check  orthostatics.  Currently preadmission blood pressure medicines have not been administered.  If BP and heart rate increase consider reinstitution of low-dose beta-blocker therapy with his recent AF and may need to continue to hold ACE inhibition.  Following discharge outpatient monitoring should be scheduled.  Troy Sine, MD, [facility] 07/26/2019 5:02 PM

## 2019-07-26 NOTE — Telephone Encounter (Signed)
Returned patient's phone call regarding cancelling 11/02 appointments, patient's wife informed me he is currently in the hospital and will not be able to come to his appointments.

## 2019-07-26 NOTE — Plan of Care (Signed)

## 2019-07-26 NOTE — Progress Notes (Addendum)
PROGRESS NOTE    Tony [name]  IHK:[id number] DOB: Apr 06, 1941 DOA: 07/24/2019 PCP: [name], MD   Brief Narrative:  Tony [name] is a 78 y.o. 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 Patient reports that he was in the bathroom on the commode and straining to have bowel movement and when he gets up to wash his hands he fell to the ground and hit on his head.  He had laceration on left side of his face. Denied headache, blurry vision, lightheadedness, dizziness, nausea, vomiting, chest pain, shortness of breath, palpitation prior to the episode.  He had loose bowel movement after the fall.  No history of diarrhea before, recent antibiotic use, recent travel, epigastric pain, nausea, vomiting, decreased appetite, melena or generalized weakness. His chemotherapy for lung cancer was held for couple of months due to fibrosis and he started on prednisone.patient is supposed to start his chemotherapy starting from this Monday.  In ED he was found to have new onset of A. Fib.  No prior known history of it. he is not on anticoagulation and does not want to be on anticoagulation as he developed ulcer/GI bleed after he started on blood thinners when he had heart attack.  Upon arrival to ED, patient's blood pressure found to be on lower side.  Troponin: WNL.  Patient had 3 loose bowel movement in the ED.  Patient received IV fluid bolus in the ED CT head and cervical spine came back negative for acute findings.  Positive for laceration and hematoma of left cheek.  He was subsequently admitted to hospital service.  Assessment & Plan:   Principal Problem:   Syncope Active Problems:   Adenocarcinoma, lung, right (HCC)   CAD  (coronary artery disease)   Hx of CABG   HTN (hypertension)   HLD (hyperlipidemia)   Macrocytosis   New onset atrial fibrillation (HCC)   Laceration of face  Syncope: Possibly secondary to dehydration causing orthostatic hypotension vs vasovagal since he had to strain for bowel movement before passing out.  All could very well be due to atrial fibrillation which is newly diagnosed but unknown whether this is new onset or not.  His rates remain controlled.  On EKG, he is noted to have atrial fibrillation with intraventricular conduction delay.  Will consult cardiology for further input.  Echocardiogram shows ejection fraction of 40 to 45% which is similar to prior readings.  He was noted to be mildly dehydrated on admission which has now resolved with IV fluids.  New onset vs newly diagnosed A. Fib: -Rate controlled.  On telemetry.  CHA2DS2-VASc: 4 -Patient does not want to start on anticoagulation-due to prior history of GI bleed.  TSH normal.  Rate is controlled.  Does not need and cannot tolerate rate control medications due to low blood pressure.  Monitor on telemetry.  Diarrhea: Started after admission.  Had 4 bowel movements.  No more diarrhea.  C. difficile negative.  Coronary artery disease status post CABG: Stable.  Asymptomatic.  Continue aspirin and statin.  History of hypertension but now hypotensive : Blood pressure 75I systolic.  This is despite of holding antihypertensives.  Continue to hold antihypertensives for now.  Stage IV adenocarcinoma of lung: S/p chemo and radiation therapy: -His chemotherapy was held for couple of  months due to fibrosis and was started on prednisone daily. -Patient is supposed to start on chemo starting from this Monday.  When I discussed with patient about him possibly needing rehab.  His only concern was that his chemotherapy might get delayed.  Dr. Deretha Emory spoke to on-call oncologist Dr.Gudena who mentioned that they cannot do his chemotherapy  while inpatient and it is okay if chemo gets delayed for 2 to 3 weeks.  Laceration of face: On left side -Repaired in ED -Tylenol as needed for pain control.  Hypokalemia/hypomagnesemia. Likely related to diuretics. replace  Macrocytosis: -H&H is currently stable.  B12 normal.  Folate pending.  TSH normal.  History of prostate cancer: Status post prostatectomy  History of GI bleed/ulcer: No signs of bleeding.  Hemoglobin dropped from 12.3-9.9, although he was likely hemoconcentrated on admission.  Continue PPI.  Repeat H&H tomorrow.  Generalized weakness/falls.  Seen by physical therapy with recommendation for skilled nursing facility.  DVT prophylaxis: SCD Code Status: Full code Family Communication:  None present at bedside.  Plan of care discussed with patient in length and he verbalized understanding and agreed with it. Disposition Plan: seen by PT with recommendations for SNF placement. Social work consulted for placement. Due to his weakness and risk of falls, he is not safe for discharge home at this time  Estimated body mass index is 23.83 kg/m as calculated from the following:   Height as of this encounter: 5\' 9"  (1.753 m).   Weight as of this encounter: 73.2 kg.    Consultants:   None  Procedures:   None  Antimicrobials:   None   Subjective: He does not have dizziness. 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   Objective: Vitals:   07/25/19 2042 07/26/19 0041 07/26/19 0504 07/26/19 1228  BP: 133/64  (!) 99/58 130/62  Pulse: 98  84 98  Resp: 18  16 18   Temp: 97.7 F (36.5 C)  98 F (36.7 C) 98.2 F (36.8 C)  TempSrc: Oral  Oral Oral  SpO2: 98%  97% 97%  Weight:  73.2 kg    Height:        Intake/Output Summary (Last 24 hours) at 07/26/2019 1246 Last data filed at 07/26/2019 0700 Gross per 24 hour  Intake 3149.51 ml  Output 701 ml  Net 2448.51 ml   Filed Weights   07/24/19 1341 07/26/19 0041  Weight: 70.3 kg 73.2 kg     Examination:  General exam: Appears calm and comfortable  HEENT: Laceration over left eyebrow, repaired Respiratory system: Diminished at the bases, mostly in the right base. Respiratory effort normal. Cardiovascular system: S1 & S2 heard, RRR. No JVD, murmurs, rubs, gallops or clicks. No pedal edema. Gastrointestinal system: Abdomen is nondistended, soft and nontender. No organomegaly or masses felt. Normal bowel sounds heard. Central nervous system: Alert and oriented. No focal neurological deficits. Extremities: Symmetric 5 x 5 power. Skin: Laceration on the left forehead. Psychiatry: Judgement and insight appear normal. Mood & affect appropriate.    Data Reviewed: I have personally reviewed following labs and imaging studies  CBC: Recent Labs  Lab 07/24/19 1349 07/25/19 0533 07/26/19 0641  WBC 10.3 7.8 7.0  NEUTROABS  --   --  4.6  HGB 12.3* 10.9* 9.9*  HCT 37.2* 33.2* 30.0*  MCV 106.6* 105.4* 104.2*  PLT 242 220 737   Basic Metabolic Panel: Recent Labs  Lab 07/24/19 1349 07/24/19 1510 07/25/19 0533 07/26/19 0641  NA 137  --  138 137  K 4.0  --  3.5 3.1*  CL 104  --  109 110  CO2 21*  --  21* 19*  GLUCOSE 125*  --  103* 103*  BUN 13  --  13 13  CREATININE 1.00  --  0.91 0.91  CALCIUM 8.8*  --  8.1* 7.7*  MG  --  1.8  --  1.5*  PHOS  --  3.7  --   --    GFR: Estimated Creatinine Clearance: 65.8 mL/min (by C-G formula based on SCr of 0.91 mg/dL). Liver Function Tests: Recent Labs  Lab 07/24/19 1510  AST 24  ALT 14  ALKPHOS 96  BILITOT 0.7  PROT 6.3*  ALBUMIN 2.5*   No results for input(s): LIPASE, AMYLASE in the last 168 hours. No results for input(s): AMMONIA in the last 168 hours. Coagulation Profile: Recent Labs  Lab 07/24/19 1510  INR 1.1   Cardiac Enzymes: No results for input(s): CKTOTAL, CKMB, CKMBINDEX, TROPONINI in the last 168 hours. BNP (last 3 results) No results for input(s): PROBNP in the last 8760 hours. HbA1C: No results  for input(s): HGBA1C in the last 72 hours. CBG: No results for input(s): GLUCAP in the last 168 hours. Lipid Profile: No results for input(s): CHOL, HDL, LDLCALC, TRIG, CHOLHDL, LDLDIRECT in the last 72 hours. Thyroid Function Tests: Recent Labs    07/24/19 1610  TSH 1.592   Anemia Panel: Recent Labs    07/24/19 1510  VITAMINB12 699   Sepsis Labs: Recent Labs  Lab 07/24/19 1610  LATICACIDVEN 1.5    Recent Results (from the past 240 hour(s))  C difficile quick scan w PCR reflex     Status: None   Collection Time: 07/24/19  3:42 PM   Specimen: STOOL  Result Value Ref Range Status   C Diff antigen NEGATIVE NEGATIVE Final   C Diff toxin NEGATIVE NEGATIVE Final   C Diff interpretation No C. difficile detected.  Final    Comment: Performed at [facility], [place] [address] [place], [place] [address]  SARS CORONAVIRUS 2 (TAT 6-24 HRS) Nasopharyngeal Nasopharyngeal Swab     Status: None   Collection Time: 07/24/19  4:32 PM   Specimen: Nasopharyngeal Swab  Result Value Ref Range Status   SARS Coronavirus 2 NEGATIVE NEGATIVE Final    Comment: (NOTE) SARS-CoV-2 target nucleic acids are NOT DETECTED. The SARS-CoV-2 RNA is generally detectable in upper and lower respiratory specimens during the acute phase of infection. Negative results do not preclude SARS-CoV-2 infection, do not rule out co-infections with other pathogens, and should not be used as the sole basis for treatment or other patient management decisions. Negative results must be combined with clinical observations, patient history, and epidemiological information. The expected result is Negative. Fact Sheet for Patients: SugarRoll.be Fact Sheet for Healthcare Providers: https://www.woods-mathews.com/ This test is not yet approved or cleared by the [place] FDA and  has been authorized for detection and/or diagnosis of SARS-CoV-2 by FDA under an Emergency Use  Authorization (EUA). This EUA will remain  in effect (meaning this test can be used) for the duration of the COVID-19 declaration under Section 56 4(b)(1) of the Act, 21 U.S.C. section 360bbb-3(b)(1), unless the authorization is terminated or revoked sooner. Performed at [facility], [place] [address] [place], [place] [address]       Radiology Studies: Dg Chest 2 View  Result Date: 07/24/2019 CLINICAL DATA:  Pt arrived via [facility] from home. Pt was on toilet trying  to have BM that was unsuccessful, pt went to sink and became dizzy and fell and hit head on floor. Pt denies LOC, pt denies blood thinners. Pt states hx of stage 3 lung cancer, states they are changing his chemo meds on Monday. Pt hx MI, HTN, CAD. Pt is a former smoker EXAM: CHEST - 2 VIEW COMPARISON:  09/24/2018 FINDINGS: Stable changes from prior CABG surgery. Cardiac silhouette is normal in size. No mediastinal or hilar masses. Coarsely thickened interstitial markings bilaterally. There is hazy opacity at the right lung base partly obscuring the hemidiaphragm. Suspect a small effusion with atelectasis. No convincing pneumonia. No pneumothorax. Left anterior chest wall Port-A-Cath tip projects in the mid superior vena cava. Skeletal structures are grossly intact. IMPRESSION: 1. No acute findings. 2. Interstitial thickening consistent interstitial lung disease. 3. Mild opacity at the right lung base is likely due to a small effusion with atelectasis. Electronically Signed   By: Lajean Manes M.D.   On: 07/24/2019 14:49   Ct Head Wo Contrast  Result Date: 07/24/2019 CLINICAL DATA:  Fall, head injury, laceration above left eye EXAM: CT HEAD WITHOUT CONTRAST CT CERVICAL SPINE WITHOUT CONTRAST TECHNIQUE: Multidetector CT imaging of the head and cervical spine was performed following the standard protocol without intravenous contrast. Multiplanar CT image reconstructions of the cervical spine were also generated. COMPARISON:  10/14/2018  FINDINGS: CT HEAD FINDINGS Brain: No evidence of acute infarction, hemorrhage, hydrocephalus, extra-axial collection or mass lesion/mass effect. Periventricular white matter hypodensity. Vascular: No hyperdense vessel or unexpected calcification. Skull: Normal. Negative for fracture or focal lesion. Sinuses/Orbits: No acute finding. Other: Soft tissue laceration and hematoma of the left cheek and forehead. CT CERVICAL SPINE FINDINGS Alignment: Normal. Skull base and vertebrae: No acute fracture. No primary bone lesion or focal pathologic process. 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 Soft tissues and spinal canal: No prevertebral fluid or swelling. No visible canal hematoma. Disc levels: Moderate disc space height loss and osteophytosis at C4 through C6. Disc spaces are otherwise preserved. Upper chest: Negative. Other: None. IMPRESSION: 1.  No acute intracranial pathology. 2. Soft tissue laceration and hematoma of the left cheek and forehead. 3.  No fracture or static subluxation of the cervical spine. Electronically Signed   By: Eddie Candle M.D.   On: 07/24/2019 14:52   Ct Cervical Spine Wo Contrast  Result Date: 07/24/2019 CLINICAL DATA:  Fall, head injury, laceration above left eye EXAM: CT HEAD WITHOUT CONTRAST CT CERVICAL SPINE WITHOUT CONTRAST TECHNIQUE: Multidetector CT imaging of the head and cervical spine was performed following the standard protocol without intravenous contrast. Multiplanar CT image reconstructions of the cervical spine were also generated. COMPARISON:  10/14/2018 FINDINGS: CT HEAD FINDINGS Brain: No evidence of acute infarction, hemorrhage, hydrocephalus, extra-axial collection or mass lesion/mass effect. Periventricular white matter hypodensity. Vascular: No hyperdense vessel or unexpected calcification. Skull: Normal. Negative for fracture or focal lesion. Sinuses/Orbits: No acute finding. Other: Soft tissue laceration and  hematoma of the left cheek and forehead. CT CERVICAL SPINE FINDINGS Alignment: Normal. Skull base and vertebrae: No acute fracture. No primary bone lesion or focal pathologic process. 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 Soft tissues and spinal canal: No prevertebral fluid or swelling. No visible canal hematoma. Disc levels: Moderate disc space height loss and osteophytosis at C4 through C6. Disc spaces are otherwise preserved. Upper chest: Negative. Other: None. IMPRESSION: 1.  No acute intracranial pathology.  2. Soft tissue laceration and hematoma of the left cheek and forehead. 3.  No fracture or static subluxation of the cervical spine. Electronically Signed   By: Eddie Candle M.D.   On: 07/24/2019 14:52    Scheduled Meds:  aspirin EC  81 mg Oral QPC supper   Chlorhexidine Gluconate Cloth  6 each Topical Daily   folic acid  1 mg Oral QAC breakfast   gabapentin  200 mg Oral QHS   [START ON 07/27/2019] influenza vaccine adjuvanted  0.5 mL Intramuscular Tomorrow-1000   multivitamin with minerals  1 tablet Oral QAC breakfast   niacin  500 mg Oral BID   pravastatin  10 mg Oral QPC supper   Continuous Infusions:  sodium chloride 125 mL/hr at 07/26/19 0907     LOS: 2 days   Time spent: 30 minutes   Kathie Dike, MD [facility]  07/26/2019, 12:46 PM   To contact the attending provider between 7A-7P or the covering provider during after hours 7P-7A, please log into the web site [email or web address]

## 2019-07-26 NOTE — TOC Initial Note (Signed)
Transition of Care [facility]) - Initial/Assessment Note    Patient Details  Name: Tony [name] MRN: [id number] Date of Birth: Jan 09, 1941  [facility] CM/SW Contact:    Alberteen Sam, [place] Phone Number: [phone] 07/26/2019, 5:10 PM  Clinical Narrative:                  CSW met with patient at bedside to inform of SNF recommendation for short term rehab. Patient requests to be assessed for inpatient rehab CIR at [facility] before agreeable to go to SNF. He reports if he does not qualify for CIR he's agreeable to go to [facility]. CSW explained insurance authorization process, patient expressed understanding.   CSW has paged MD for CIR consult.  Expected Discharge Plan: Skilled Nursing Facility Barriers to Discharge: Continued Medical Work up   Patient Goals and CMS Choice Patient states their goals for this hospitalization and ongoing recovery are:: to go to inpatient rehab at [facility] before home CMS Medicare.gov Compare Post Acute Care list provided to:: Patient Choice offered to / list presented to : Patient  Expected Discharge Plan and Services Expected Discharge Plan: [place] Choice: [place] arrangements for the past 2 months: Single Family Home                                      Prior Living Arrangements/Services Living arrangements for the past 2 months: Single Family Home Lives with:: Spouse Patient language and need for interpreter reviewed:: Yes Do you feel safe going back to the place where you live?: Yes      Need for Family Participation in Patient Care: Yes (Comment) Care giver support system in place?: Yes (comment)   Criminal Activity/Legal Involvement Pertinent to Current Situation/Hospitalization: No - Comment as needed  Activities of Daily Living      Permission Sought/Granted Permission sought to share information with : Case Manager, Customer service manager,  Family Supports    Share Information with NAME: Audrea Muscat  Permission granted to share info w AGENCY: SNFs  Permission granted to share info w Relationship: spouse  Permission granted to share info w Contact Information: [phone]  Emotional Assessment Appearance:: Appears stated age Attitude/Demeanor/Rapport: Gracious Affect (typically observed): Calm Orientation: : Oriented to Self, Oriented to Place, Oriented to  Time, Oriented to Situation Alcohol / Substance Use: Not Applicable Psych Involvement: No (comment)  Admission diagnosis:  New onset a-fib ([place]) [I48.91] Near syncope [R55] Facial laceration, initial encounter [S01.81XA] Fall, initial encounter [W19.XXXA] Patient Active Problem List   Diagnosis Date Noted  . Syncope 07/24/2019  . Macrocytosis 07/24/2019  . New onset atrial fibrillation ([place]) 07/24/2019  . Laceration of face 07/24/2019  . Anemia due to antineoplastic chemotherapy 03/16/2019  . Protein calorie malnutrition ([place]) 10/27/2018  . Constipation 10/27/2018  . Port-A-Cath in place 10/20/2018  . CAD (coronary artery disease) 10/13/2018  . Hx of CABG 10/13/2018  . Contusion of head 10/13/2018  . HTN (hypertension) 10/13/2018  . HLD (hyperlipidemia) 10/13/2018  . Encounter for antineoplastic chemotherapy 08/27/2018  . Encounter for antineoplastic immunotherapy 08/27/2018  . Goals of care, counseling/discussion 08/27/2018  . Brain metastases ([place]) 08/26/2018  . Adenocarcinoma, lung, right ([place]) 08/14/2018  . Bone metastasis ([place]) 07/30/2018  . Thrombocytopenia ([place]) 08/10/2012  . 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 Numbness and tingling 07/28/2012  PCP:  [name], MD Pharmacy:   [id number] [place] [id number]: [phone]: [phone]    Social Determinants of Health (SDOH) Interventions     Readmission Risk Interventions No flowsheet data found.

## 2019-07-27 DIAGNOSIS — I480 Paroxysmal atrial fibrillation: Secondary | ICD-10-CM

## 2019-07-27 LAB — BASIC METABOLIC PANEL
Anion gap: 7 (ref 5–15)
BUN: 9 mg/dL (ref 8–23)
CO2: 19 mmol/L — ABNORMAL LOW (ref 22–32)
Calcium: 8 mg/dL — ABNORMAL LOW (ref 8.9–10.3)
Chloride: 113 mmol/L — ABNORMAL HIGH (ref 98–111)
Creatinine, Ser: 0.76 mg/dL (ref 0.61–1.24)
GFR calc Af Amer: >60 mL/min (ref >60)
GFR calc non Af Amer: >60 mL/min (ref >60)
Glucose, Bld: 106 mg/dL — ABNORMAL HIGH (ref 70–99)
Potassium: 3.9 mmol/L (ref 3.5–5.1)
Sodium: 139 mmol/L (ref 135–145)

## 2019-07-27 LAB — CBC
HCT: 30.7 % — ABNORMAL LOW (ref 39.0–52.0)
Hemoglobin: 10 g/dL — ABNORMAL LOW (ref 13.0–17.0)
MCH: 34.6 pg — ABNORMAL HIGH (ref 26.0–34.0)
MCHC: 32.6 g/dL (ref 30.0–36.0)
MCV: 106.2 fL — ABNORMAL HIGH (ref 80.0–100.0)
Platelets: 221 10*3/uL (ref 150–400)
RBC: 2.89 MIL/uL — ABNORMAL LOW (ref 4.22–5.81)
RDW: 14.8 % (ref 11.5–15.5)
WBC: 8.3 10*3/uL (ref 4.0–10.5)
nRBC: 0 % (ref 0.0–0.2)

## 2019-07-27 LAB — LIPID PANEL
Cholesterol: 115 mg/dL (ref 0–200)
HDL: 49 mg/dL (ref >40)
LDL Cholesterol: 52 mg/dL (ref 0–99)
Total CHOL/HDL Ratio: 2.3 RATIO
Triglycerides: 71 mg/dL (ref <150)
VLDL: 14 mg/dL (ref 0–40)

## 2019-07-27 MED ORDER — METOPROLOL TARTRATE 12.5 MG HALF TABLET
12.5000 mg | ORAL_TABLET | Freq: Two times a day (BID) | ORAL | Status: DC
  Administered 2019-07-27 – 2019-07-29 (×5): 12.5 mg via ORAL
  Filled 2019-07-27 (×5): qty 1

## 2019-07-27 MED ORDER — ROSUVASTATIN CALCIUM 20 MG PO TABS
20.0000 mg | ORAL_TABLET | Freq: Every day | ORAL | Status: DC
  Administered 2019-07-27 – 2019-07-28 (×2): 20 mg via ORAL
  Filled 2019-07-27 (×2): qty 1

## 2019-07-27 NOTE — Progress Notes (Addendum)
Progress Note  Patient Name: Tony [name] Date of Encounter: 07/27/2019  Primary Cardiologist: Larae Grooms, MD  Subjective   Feeling well this morning. No specific complaints. Sitting up in chair after walking.   Inpatient Medications    Scheduled Meds: . aspirin EC  81 mg Oral QPC supper  . Chlorhexidine Gluconate Cloth  6 each Topical Daily  . folic acid  1 mg Oral QAC breakfast  . gabapentin  200 mg Oral QHS  . influenza vaccine adjuvanted  0.5 mL Intramuscular Tomorrow-1000  . multivitamin with minerals  1 tablet Oral QAC breakfast  . niacin  500 mg Oral BID  . pravastatin  10 mg Oral QPC supper   Continuous Infusions:  PRN Meds: acetaminophen **OR** acetaminophen, ondansetron **OR** ondansetron (ZOFRAN) IV, pantoprazole, polyethylene glycol   Vital Signs    Vitals:   07/26/19 0504 07/26/19 1228 07/26/19 2001 07/27/19 0418  BP: (!) 99/58 130/62 117/63 (!) 122/59  Pulse: 84 98 86 97  Resp: 16 18 18 18   Temp: 98 F (36.7 C) 98.2 F (36.8 C) 97.9 F (36.6 C) 98.2 F (36.8 C)  TempSrc: Oral Oral Oral Oral  SpO2: 97% 97% 96% 97%  Weight:    75.5 kg  Height:        Intake/Output Summary (Last 24 hours) at 07/27/2019 0919 Last data filed at 07/27/2019 0427 Gross per 24 hour  Intake 680 ml  Output 400 ml  Net 280 ml   Last 3 Weights 07/27/2019 07/26/2019 07/24/2019  Weight (lbs) 166 lb 6.4 oz 161 lb 6.4 oz 155 lb  Weight (kg) 75.479 kg 73.211 kg 70.308 kg      Telemetry    SR - Personally Reviewed  ECG    No new tracing.  Physical Exam  Pleasant older WM GEN: No acute distress.   Neck: No JVD Cardiac: RRR, no murmurs, rubs, or gallops.  Respiratory: Clear to auscultation bilaterally. GI: Soft, nontender, non-distended  MS: No edema; No deformity. Neuro:  Nonfocal  Psych: Normal affect   Labs    High Sensitivity Troponin:   Recent Labs  Lab 07/24/19 1349 07/24/19 1510  TROPONINIHS 13 13      Chemistry Recent Labs  Lab  07/24/19 1510 07/25/19 0533 07/26/19 0641 07/27/19 0355  NA  --  138 137 139  K  --  3.5 3.1* 3.9  CL  --  109 110 113*  CO2  --  21* 19* 19*  GLUCOSE  --  103* 103* 106*  BUN  --  13 13 9   CREATININE  --  0.91 0.91 0.76  CALCIUM  --  8.1* 7.7* 8.0*  PROT 6.3*  --   --   --   ALBUMIN 2.5*  --   --   --   AST 24  --   --   --   ALT 14  --   --   --   ALKPHOS 96  --   --   --   BILITOT 0.7  --   --   --   GFRNONAA  --  >60 >60 >60  GFRAA  --  >60 >60 >60  ANIONGAP  --  8 8 7      Hematology Recent Labs  Lab 07/25/19 0533 07/26/19 0641 07/27/19 0355  WBC 7.8 7.0 8.3  RBC 3.15* 2.88* 2.89*  HGB 10.9* 9.9* 10.0*  HCT 33.2* 30.0* 30.7*  MCV 105.4* 104.2* 106.2*  MCH 34.6* 34.4* 34.6*  MCHC 32.8 33.0  32.6  RDW 14.8 14.7 14.8  PLT 220 190 221   Lipid Panel     Component Value Date/Time   CHOL 115 07/27/2019 0938   TRIG 71 07/27/2019 0938   HDL 49 07/27/2019 0938   CHOLHDL 2.3 07/27/2019 0938   VLDL 14 07/27/2019 0938   LDLCALC 52 07/27/2019 0938   BNPNo results for input(s): BNP, PROBNP in the last 168 hours.   DDimer No results for input(s): DDIMER in the last 168 hours.   Radiology    No results found.  Cardiac Studies   TTE: 07/25/19  IMPRESSIONS   1. Technically difficult study. Images are not adequate to assess regional wall motion, consider repeat lmited TTE with contrast. 2. Left ventricular ejection fraction, by visual estimation, is 40 to 45%. The left ventricle has mild to moderately decreased function. There is mildly increased left ventricular hypertrophy. Appears to be basal to mid inferolateral/inferior  hypokinesis 3. Left ventricular diastolic parameters are consistent with Grade I diastolic dysfunction (impaired relaxation). 4. Left atrial size was normal. 5. Right atrial size was normal. 6. The mitral valve is normal in structure. No evidence of mitral valve regurgitation. 7. The tricuspid valve is not well visualized.  Tricuspid valve regurgitation is not demonstrated. 8. The aortic valve was not well visualized. Aortic valve regurgitation is not visualized. 9. The pulmonic valve was not well visualized. Pulmonic valve regurgitation is not visualized. 10. The inferior vena cava is normal in size with greater than 50% respiratory variability, suggesting right atrial pressure of 3 mmHg. 11. Global right ventricle has normal systolic function.The right ventricular size is normal. No increase in right ventricular wall thickness. 12. There is mild dilatation of the aortic root measuring 38 mm.  Patient Profile     78 y.o. male with a hx of HTN, HL, stage IV adenocarcinoma of lung, CAD s/p 4vCABG, anemia, PVD and AAA who was seen for the evaluation of Afib/syncope at the request of Dr. Roderic Palau.  Assessment & Plan    1. Syncope: seems vasovagal in nature, likely worsened by dehydration. Was straining to have a bowel and passed out when he stood up. Blood pressures were soft on admission. Improved with IVFs. Last office note stated for him to decrease his metoprolol to 25mg  BID 2/2 to soft bps and consider stopping Altace if remained low. Currently off all blood pressure meds. Using compression stockings. Blood pressures remain stable but would continue to hold meds for now.  -- being elevated for CIR placement.   2. New onset Afib: noted on admission, rate controlled. He has converted to SR and now maintaining. This patients CHA2DS2-VASc Score of at least 4. Discussed options regarding [place] with him but he is very hesitant to consider given his GI bleed around time of his CABG and Ca. He is chronically anemic as well, last transfusion was about 3 months prior. Platelet count is stable. Given he is back in SR will hold on adding anticoagulation, especially with recent trauma. May need oncology input as well. TSH normal. -- plan for cardiac monitor at DC to determine Afib recurrence.   3. Stage IV lung Ca with  metastasis: followed by Dr. Inda Merlin. Currently on palliative chemotherapy.   4. CAD s/p 4vCABG: has been maintained on ASA and statin prior to admission. No chest pain. Negative HsT  5. Anemia with Hx of GI bleed: Hgb 12.3>>9.9>>10.0 but suspect some dilution as he was given IVFs. Does report intermittent episodes of bright red blood but has  hemorrhoids.     6. Hypokalemia/hypoymag: K+3.1, Mag 1.5 yesterday. Replete per primary. Recheck mag in am.  7. HL: reports trying several different statins in the past with hx of intolerance. Will discuss switching to higher dose statin given his hx.   For questions or updates, please contact [place] Please consult [email or web address] for contact info under   Signed, Reino Bellis, NP  07/27/2019, 9:19 AM    Patient seen and examined. Agree with assessment and plan. Maintaining sinus rhythm; rate now in the 80s. BP adequate. Will try to re-institute low dose metoprolol at 12.5 mg bid. Cardiac monitor at dc.    Troy Sine, MD, [facility] 07/27/2019 11:55 AM

## 2019-07-27 NOTE — Consult Note (Signed)
Inpatient Rehab Admissions:  Inpatient Rehab Consult received.  I met with pt at the bedside for rehabilitation assessment. Noted pt did very well with PT this AM and is Min A/Supervision for transfers and supervision 120 feet with RW. I discussed with pt that based on his current functional level he does not require an IP Rehab stay. Pt verbalized understanding. He does not yet feel ready to DC home as he will need to be Independent. Pt is requesting SNF at this time.   AC will sign off and contact TOC team regarding SNF request.   Please call if questions.   Jhonnie Garner, OTR/L  Rehab Admissions Coordinator  [phone] 07/27/2019 12:18 PM

## 2019-07-27 NOTE — Care Management Important Message (Signed)
Important Message  Patient Details  Name: ROALD LUKACS MRN: [id number] Date of Birth: Dec 06, 1940   Medicare Important Message Given:  Yes     [name] 07/27/2019, 3:11 PM

## 2019-07-27 NOTE — Progress Notes (Signed)
PROGRESS NOTE    Tony [name]  FAO:[id number] DOB: 10-14-1940 DOA: 07/24/2019 PCP: [name], MD   Brief Narrative:  Tony [name] is a 78 y.o. 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 Patient reports that he was in the bathroom on the commode and straining to have bowel movement and when he gets up to wash his hands he fell to the ground and hit on his head.  He had laceration on left side of his face. Denied headache, blurry vision, lightheadedness, dizziness, nausea, vomiting, chest pain, shortness of breath, palpitation prior to the episode.  He had loose bowel movement after the fall.  No history of diarrhea before, recent antibiotic use, recent travel, epigastric pain, nausea, vomiting, decreased appetite, melena or generalized weakness. His chemotherapy for lung cancer was held for couple of months due to fibrosis and he started on prednisone.patient is supposed to start his chemotherapy starting from this Monday.  In ED he was found to have new onset of A. Fib.  No prior known history of it. he is not on anticoagulation and does not want to be on anticoagulation as he developed ulcer/GI bleed after he started on blood thinners when he had heart attack.  Upon arrival to ED, patient's blood pressure found to be on lower side.  Troponin: WNL.  Patient had 3 loose bowel movement in the ED.  Patient received IV fluid bolus in the ED CT head and cervical spine came back negative for acute findings.  Positive for laceration and hematoma of left cheek.  He was subsequently admitted to hospital service.  Assessment & Plan:   Principal Problem:   Syncope Active Problems:   Adenocarcinoma, lung, right (HCC)   CAD  (coronary artery disease)   Hx of CABG   HTN (hypertension)   HLD (hyperlipidemia)   Macrocytosis   New onset atrial fibrillation (HCC)   Laceration of face  Syncope: Possibly secondary to dehydration causing orthostatic hypotension vs vasovagal since he had to strain for bowel movement before passing out. On EKG, he was noted to have atrial fibrillation with intraventricular conduction delay.  Seen by cardiology with recommendations for outpatient cardiac monitor.  Echocardiogram shows ejection fraction of 40 to 45% which is similar to prior readings.  He was noted to be mildly dehydrated on admission which resolved with IV fluids.  He is not having any further symptoms  New onset vs newly diagnosed A. Fib: -Rate controlled.  On telemetry.  CHA2DS2-VASc: 4 -Patient does not want to start on anticoagulation-due to prior history of GI bleed.  TSH normal.  Heart rate is controlled.  Started on metoprolol.  Cardiology will arrange cardiac monitor on discharge  Diarrhea: Started after admission.  Had 4 bowel movements.  No more diarrhea.  C. difficile negative.  Coronary artery disease status post CABG: Stable.  Asymptomatic.  Continue aspirin and statin.  Hypertension.  Blood pressure was initially low on admission.  It has since improved.  He has been restarted on metoprolol.  Stage IV adenocarcinoma of lung: S/p chemo and radiation therapy: -His chemotherapy was held for couple of months due to fibrosis and was started on prednisone daily. -Patient is supposed to start on chemo starting from this Monday.  When I discussed with patient about him possibly  needing rehab.  His only concern was that his chemotherapy might get delayed.  Dr. Deretha Emory spoke to on-call oncologist Dr.Gudena who mentioned that they cannot do his chemotherapy while inpatient and it is okay if chemo gets delayed for 2 to 3 weeks.  Laceration of face: On left side -Repaired in ED -Tylenol as needed for pain control.   Hypokalemia/hypomagnesemia. Likely related to diuretics. replaced  Macrocytosis: -H&H is currently stable.  B12 normal.  Folate high.  TSH normal.  History of prostate cancer: Status post prostatectomy  History of GI bleed/ulcer: No signs of bleeding.  Hemoglobin dropped from 12.3-10, although he was likely hemoconcentrated on admission.  Continue PPI.  Hemoglobin appears to be stabilizing  Generalized weakness/falls.  Seen by physical therapy with recommendation for skilled nursing facility. He was evaluated by CIR and was not felt to be appropriate for CIR. He is agreeable to SNF. Will plan on discharge once he has been accepted to SNF, since he is not safe to return home alone due to generalized weakness/risk of falls.  DVT prophylaxis: SCD Code Status: Full code Family Communication:  None present at bedside.  Plan of care discussed with patient in length and he verbalized understanding and agreed with it. Disposition Plan: seen by PT with recommendations for SNF placement. Social work consulted for placement. Due to his weakness and risk of falls, he is not safe for discharge home at this time  Estimated body mass index is 24.57 kg/m as calculated from the following:   Height as of this encounter: 5\' 9"  (1.753 m).   Weight as of this encounter: 75.5 kg.    Consultants:   None  Procedures:   None  Antimicrobials:   None   Subjective: No chest pain, shortness of breath or dizziness. Still feels weak. Does not feel that he can return home independently at this time.  Objective: Vitals:   07/26/19 1228 07/26/19 2001 07/27/19 0418 07/27/19 1223  BP: 130/62 117/63 (!) 122/59 128/79  Pulse: 98 86 97 95  Resp: 18 18 18 18   Temp: 98.2 F (36.8 C) 97.9 F (36.6 C) 98.2 F (36.8 C) 98 F (36.7 C)  TempSrc: Oral Oral Oral Oral  SpO2: 97% 96% 97% 97%  Weight:   75.5 kg   Height:        Intake/Output Summary (Last 24 hours) at 07/27/2019 1538 Last data filed at  07/27/2019 0427 Gross per 24 hour  Intake 460 ml  Output 400 ml  Net 60 ml   Filed Weights   07/24/19 1341 07/26/19 0041 07/27/19 0418  Weight: 70.3 kg 73.2 kg 75.5 kg    Examination: General exam: Alert, awake, oriented x 3 HEENT: laceration over left eye that has been repaired, erythema of left conjunctiva Respiratory system: Clear to auscultation. Respiratory effort normal. Cardiovascular system:RRR. No murmurs, rubs, gallops. Gastrointestinal system: Abdomen is nondistended, soft and nontender. No organomegaly or masses felt. Normal bowel sounds heard. Central nervous system: Alert and oriented. No focal neurological deficits. Extremities: No C/C/E, +pedal pulses Skin: No rashes, lesions or ulcers Psychiatry: Judgement and insight appear normal. Mood & affect appropriate.     Data Reviewed: I have personally reviewed following labs and imaging studies  CBC: Recent Labs  Lab 07/24/19 1349 07/24/19 1610 07/25/19 0533 07/26/19 0641 07/27/19 0355  WBC 10.3  --  7.8 7.0 8.3  NEUTROABS  --   --   --  4.6  --   HGB 12.3*  --  10.9* 9.9* 10.0*  HCT 37.2* 33.2* 33.2* 30.0* 30.7*  MCV 106.6*  --  105.4* 104.2* 106.2*  PLT 242  --  220 190 578   Basic Metabolic Panel: Recent Labs  Lab 07/24/19 1349 07/24/19 1510 07/25/19 0533 07/26/19 0641 07/27/19 0355  NA 137  --  138 137 139  K 4.0  --  3.5 3.1* 3.9  CL 104  --  109 110 113*  CO2 21*  --  21* 19* 19*  GLUCOSE 125*  --  103* 103* 106*  BUN 13  --  13 13 9   CREATININE 1.00  --  0.91 0.91 0.76  CALCIUM 8.8*  --  8.1* 7.7* 8.0*  MG  --  1.8  --  1.5*  --   PHOS  --  3.7  --   --   --    GFR: Estimated Creatinine Clearance: 74.9 mL/min (by C-G formula based on SCr of 0.76 mg/dL). Liver Function Tests: Recent Labs  Lab 07/24/19 1510  AST 24  ALT 14  ALKPHOS 96  BILITOT 0.7  PROT 6.3*  ALBUMIN 2.5*   No results for input(s): LIPASE, AMYLASE in the last 168 hours. No results for input(s): AMMONIA in the  last 168 hours. Coagulation Profile: Recent Labs  Lab 07/24/19 1510  INR 1.1   Cardiac Enzymes: No results for input(s): CKTOTAL, CKMB, CKMBINDEX, TROPONINI in the last 168 hours. BNP (last 3 results) No results for input(s): PROBNP in the last 8760 hours. HbA1C: No results for input(s): HGBA1C in the last 72 hours. CBG: No results for input(s): GLUCAP in the last 168 hours. Lipid Profile: Recent Labs    07/27/19 0938  CHOL 115  HDL 49  LDLCALC 52  TRIG 71  CHOLHDL 2.3   Thyroid Function Tests: No results for input(s): TSH, T4TOTAL, FREET4, T3FREE, THYROIDAB in the last 72 hours. Anemia Panel: No results for input(s): VITAMINB12, FOLATE, FERRITIN, TIBC, IRON, RETICCTPCT in the last 72 hours. Sepsis Labs: Recent Labs  Lab 07/24/19 1610  LATICACIDVEN 1.5    Recent Results (from the past 240 hour(s))  C difficile quick scan w PCR reflex     Status: None   Collection Time: 07/24/19  3:42 PM   Specimen: STOOL  Result Value Ref Range Status   C Diff antigen NEGATIVE NEGATIVE Final   C Diff toxin NEGATIVE NEGATIVE Final   C Diff interpretation No C. difficile detected.  Final    Comment: Performed at [facility], [place] [address] [place], [place] [address]  Stool culture (children & immunocomp patients)     Status: None (Preliminary result)   Collection Time: 07/24/19  3:42 PM   Specimen: Perirectal; Stool  Result Value Ref Range Status   Salmonella/Shigella Screen Final report  Final   Campylobacter Culture PENDING  Incomplete   E coli, Shiga toxin Assay Negative Negative Final    Comment: (NOTE) Performed At: [facility] [place], [place] [id number] Rush Farmer MD MW:[id number]   STOOL CULTURE REFLEX - RSASHR     Status: None   Collection Time: 07/24/19  3:42 PM  Result Value Ref Range Status   Stool Culture result 1 (RSASHR) Comment  Final    Comment: (NOTE) No Salmonella or Shigella recovered. Performed At: [facility], [place], [place] [id number] Rush Farmer MD DG:[id number]   SARS CORONAVIRUS 2 (TAT 6-24 HRS) Nasopharyngeal Nasopharyngeal Swab     Status: None   Collection Time: 07/24/19  4:32 PM  Specimen: Nasopharyngeal Swab  Result Value Ref Range Status   SARS Coronavirus 2 NEGATIVE NEGATIVE Final    Comment: (NOTE) SARS-CoV-2 target nucleic acids are NOT DETECTED. The SARS-CoV-2 RNA is generally detectable in upper and lower respiratory specimens during the acute phase of infection. Negative results do not preclude SARS-CoV-2 infection, do not rule out co-infections with other pathogens, and should not be used as the sole basis for treatment or other patient management decisions. Negative results must be combined with clinical observations, patient history, and epidemiological information. The expected result is Negative. Fact Sheet for Patients: SugarRoll.be Fact Sheet for Healthcare Providers: https://www.woods-mathews.com/ This test is not yet approved or cleared by the [place] FDA and  has been authorized for detection and/or diagnosis of SARS-CoV-2 by FDA under an Emergency Use Authorization (EUA). This EUA will remain  in effect (meaning this test can be used) for the duration of the COVID-19 declaration under Section 56 4(b)(1) of the Act, 21 U.S.C. section 360bbb-3(b)(1), unless the authorization is terminated or revoked sooner. Performed at [facility], [place] [address] [place], [place] [address]       Radiology Studies: No results found.  Scheduled Meds: . aspirin EC  81 mg Oral QPC supper  . Chlorhexidine Gluconate Cloth  6 each Topical Daily  . folic acid  1 mg Oral QAC breakfast  . gabapentin  200 mg Oral QHS  . metoprolol tartrate  12.5 mg Oral BID  . multivitamin with minerals  1 tablet Oral QAC breakfast  . niacin  500 mg Oral BID  . rosuvastatin  20 mg Oral q1800   Continuous  Infusions:    LOS: 3 days   Time spent: 30 minutes   Kathie Dike, MD [facility]  07/27/2019, 3:38 PM   To contact the attending provider between 7A-7P or the covering provider during after hours 7P-7A, please log into the web site [email or web address]

## 2019-07-27 NOTE — Progress Notes (Signed)
Physical Therapy Treatment Patient Details Name: Tony [name] MRN: [id number] DOB: 07/21/1941 Today's Date: 07/27/2019    History of Present Illness Tony [name] is a 78 y.o. 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 Patient reports that he was in the bathroom on the commode and straining to have bowel movement and when he gets up to wash his hands he fell to the ground and hit on his head.  He had laceration on left side of his face. Denied headache, blurry vision, lightheadedness, dizziness, nausea, vomiting, chest pain, shortness of breath, palpitation prior to the episode.  He had loose bowel movement after the fall.  No history of diarrhea before, recent antibiotic use, recent travel, epigastric pain, nausea, vomiting, decreased appetite, melena or generalized weakness. His chemotherapy for lung cancer was held for couple of months due to fibrosis and he started on prednisone.patient is supposed to start his chemotherapy starting from this Monday. Pt in afib with low BP.     PT Comments    Pt tolerated treatment well, demonstrating improved activity tolerance and reduced assistance requirements during functional mobility. Pt demonstrates improved transfer quality with PT cuing for hand and foot placement, and is now able to transfer and ambulate at supervision level. Pt remains limited in bed mobility, endurance, and stair negotiation, and will benefit from continued acute PT services to reduce falls risk and restore independence in mobility.   Follow Up Recommendations  SNF;Supervision/Assistance - 24 hour     Equipment Recommendations  Rolling walker with 5" wheels    Recommendations for Other Services        Precautions / Restrictions Precautions Precautions: Fall Restrictions Weight Bearing Restrictions: No    Mobility  Bed Mobility                  Transfers Overall transfer level: Needs assistance Equipment used: Rolling walker (2 wheeled) Transfers: Sit to/from Stand Sit to Stand: Min assist;Supervision(minA progressing to supervision)         General transfer comment: pt performs 7 sit to stand reps during session, PT providing cues for hand and foot placement, rocking for momentum  Ambulation/Gait Ambulation/Gait assistance: Supervision Gait Distance (Feet): 120 Feet Assistive device: Rolling walker (2 wheeled) Gait Pattern/deviations: Step-through pattern Gait velocity: reduced Gait velocity interpretation: 1.31 - 2.62 ft/sec, indicative of limited community ambulator General Gait Details: slowed step through gait with reduced stride length   Stairs             Wheelchair Mobility    Modified Rankin (Stroke Patients Only)       Balance Overall balance assessment: Needs assistance Sitting-balance support: No upper extremity supported;Feet supported Sitting balance-Leahy Scale: Good Sitting balance - Comments: supervision   Standing balance support: Bilateral upper extremity supported;During functional activity Standing balance-Leahy Scale: Good Standing balance comment: supervision with BUE support                            Cognition Arousal/Alertness: Awake/alert Behavior During Therapy: WFL for tasks assessed/performed Overall Cognitive Status: Within Functional Limits for tasks assessed  Exercises      General Comments General comments (skin integrity, edema, etc.): VSS, pt without orthostatic symptoms      Pertinent Vitals/Pain Pain Assessment: No/denies pain    Home Living                      Prior Function            PT Goals (current goals can  now be found in the care plan section) Acute Rehab PT Goals Patient Stated Goal: to go to SNF for therapy Progress towards PT goals: Progressing toward goals    Frequency    Min 3X/week      PT Plan Current plan remains appropriate    Co-evaluation              AM-PAC PT "6 Clicks" Mobility   Outcome Measure  Help needed turning from your back to your side while in a flat bed without using bedrails?: None Help needed moving from lying on your back to sitting on the side of a flat bed without using bedrails?: None Help needed moving to and from a bed to a chair (including a wheelchair)?: None Help needed standing up from a chair using your arms (e.g., wheelchair or bedside chair)?: None Help needed to walk in hospital room?: None Help needed climbing 3-5 steps with a railing? : A Little 6 Click Score: 23    End of Session Equipment Utilized During Treatment: (none) Activity Tolerance: Patient tolerated treatment well Patient left: in chair;with call bell/phone within reach Nurse Communication: Mobility status PT Visit Diagnosis: Unsteadiness on feet (R26.81);Muscle weakness (generalized) (M62.81)     Time: 9470-9628 PT Time Calculation (min) (ACUTE ONLY): 24 min  Charges:  $Gait Training: 8-22 mins $Therapeutic Activity: 8-22 mins                     Zenaida Niece, PT, DPT Acute Rehabilitation Pager: [phone]    Zenaida Niece 07/27/2019, 10:09 AM

## 2019-07-28 LAB — BASIC METABOLIC PANEL
Anion gap: 9 (ref 5–15)
BUN: 11 mg/dL (ref 8–23)
CO2: 20 mmol/L — ABNORMAL LOW (ref 22–32)
Calcium: 8.3 mg/dL — ABNORMAL LOW (ref 8.9–10.3)
Chloride: 109 mmol/L (ref 98–111)
Creatinine, Ser: 0.94 mg/dL (ref 0.61–1.24)
GFR calc Af Amer: >60 mL/min (ref >60)
GFR calc non Af Amer: >60 mL/min (ref >60)
Glucose, Bld: 87 mg/dL (ref 70–99)
Potassium: 3.9 mmol/L (ref 3.5–5.1)
Sodium: 138 mmol/L (ref 135–145)

## 2019-07-28 LAB — CBC
HCT: 33.3 % — ABNORMAL LOW (ref 39.0–52.0)
Hemoglobin: 10.8 g/dL — ABNORMAL LOW (ref 13.0–17.0)
MCH: 34.4 pg — ABNORMAL HIGH (ref 26.0–34.0)
MCHC: 32.4 g/dL (ref 30.0–36.0)
MCV: 106.1 fL — ABNORMAL HIGH (ref 80.0–100.0)
Platelets: 230 10*3/uL (ref 150–400)
RBC: 3.14 MIL/uL — ABNORMAL LOW (ref 4.22–5.81)
RDW: 14.7 % (ref 11.5–15.5)
WBC: 8.2 10*3/uL (ref 4.0–10.5)
nRBC: 0 % (ref 0.0–0.2)

## 2019-07-28 LAB — MAGNESIUM, URINE: Magnesium, urine: 13.9 mg/dL

## 2019-07-28 LAB — MAGNESIUM: Magnesium: 1.8 mg/dL (ref 1.7–2.4)

## 2019-07-28 MED ORDER — POLYETHYLENE GLYCOL 3350 17 G PO PACK
17.0000 g | PACK | Freq: Every day | ORAL | Status: DC
  Administered 2019-07-28 – 2019-07-29 (×2): 17 g via ORAL
  Filled 2019-07-28: qty 1

## 2019-07-28 NOTE — TOC Progression Note (Addendum)
Transition of Care [facility]) - Progression Note    Patient Details  Name: Tony [name] MRN: [id number] Date of Birth: 12/17/1940  Transition of Care [facility]) CM/SW Contact  Zenon Mayo, RN Phone Number: 07/28/2019, 1:51 PM  Clinical Narrative:    Faxed request for auth to [place] with Sharyn Lull, ref number is [id number].    Expected Discharge Plan: [place] Barriers to Discharge: Continued Medical Work up  Expected Discharge Plan and Services Expected Discharge Plan: [place] Choice: [place] arrangements for the past 2 months: Single Family Home                                       Social Determinants of Health (SDOH) Interventions    Readmission Risk Interventions No flowsheet data found.

## 2019-07-28 NOTE — Progress Notes (Signed)
PROGRESS NOTE    Tony [name]  ACZ:[id number] DOB: 1941/02/10 DOA: 07/24/2019 PCP: [name], MD   Brief Narrative:  Tony [name] is a 78 y.o. 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 Patient reports that he was in the bathroom on the commode and straining to have bowel movement and when he gets up to wash his hands he fell to the ground and hit on his head.  He had laceration on left side of his face. Denied headache, blurry vision, lightheadedness, dizziness, nausea, vomiting, chest pain, shortness of breath, palpitation prior to the episode.  He had loose bowel movement after the fall.  No history of diarrhea before, recent antibiotic use, recent travel, epigastric pain, nausea, vomiting, decreased appetite, melena or generalized weakness. His chemotherapy for lung cancer was held for couple of months due to fibrosis and he started on prednisone.patient is supposed to start his chemotherapy starting from this Monday.  In ED he was found to have new onset of A. Fib.  No prior known history of it. he is not on anticoagulation and does not want to be on anticoagulation as he developed ulcer/GI bleed after he started on blood thinners when he had heart attack.  Upon arrival to ED, patient's blood pressure found to be on lower side.  Troponin: WNL.  Patient had 3 loose bowel movement in the ED.  Patient received IV fluid bolus in the ED CT head and cervical spine came back negative for acute findings.  Positive for laceration and hematoma of left cheek.  He was subsequently admitted to hospital service.  Assessment & Plan:   Principal Problem:   Syncope Active Problems:   Adenocarcinoma, lung, right (HCC)   CAD  (coronary artery disease)   Hx of CABG   HTN (hypertension)   HLD (hyperlipidemia)   Macrocytosis   New onset atrial fibrillation (HCC)   Laceration of face  Syncope: Possibly secondary to dehydration causing orthostatic hypotension vs vasovagal since he had to strain for bowel movement before passing out.  Echocardiogram shows ejection fraction of 40 to 45% which is similar to prior readings.  He was noted to be mildly dehydrated on admission which resolved with IV fluids.  He is not having any further symptoms  New onset vs newly diagnosed A. Fib: -Rate controlled.  On telemetry.  CHA2DS2-VASc: 4 -Patient does not want to start on anticoagulation-due to prior history of GI bleed.  TSH normal.  Heart rate is controlled.  Started on metoprolol.   Cardiology to arrange cardiac monitor on discharge  Diarrhea: Started after admission.  Had 4 bowel movements.  No more diarrhea.  C. difficile negative. Monitor for resolutioon  Coronary artery disease status post CABG: Stable.  Asymptomatic.  Continue aspirin and statin.  Hypertension.  Blood pressure was initially low on admission.  It has since improved.  He has been restarted on metoprolol.  Stage IV adenocarcinoma of lung: S/p chemo and radiation therapy: -His chemotherapy was held for couple of months due to fibrosis and was started on prednisone daily. -Patient is supposed to start on chemo starting from this Monday.  When I discussed with patient about him possibly needing rehab. resunme per Oncology and Dr. Doristine Bosworth discussion when d/c and stable at SNF.  Laceration of face:  On left side -Repaired in ED -Tylenol as needed for pain control. -sutures to be removed in 7 days  Hypokalemia/hypomagnesemia. Likely related to diuretics. replaced  Macrocytosis: -H&H is currently stable.  B12 normal.  Folate high.  TSH normal.  History of prostate cancer: Status post prostatectomy  History of GI bleed/ulcer: No signs of bleeding.   Hemoglobin dropped from 12.3-10, although he was likely hemoconcentrated on admission.  Continue PPI.  Hemoglobin appears to be stabilizing  Generalized weakness/falls.  Seen by physical therapy with recommendation for skilled nursing facility. Await SNF  DVT prophylaxis: SCD Code Status: Full code Family Communication: called but no answer to wife [phone]. Disposition Plan: seen by PT with recommendations for SNF placement. Social work consulted for placement. Due to his weakness and risk of falls, he is not safe for discharge home at this time  Estimated body mass index is 24.44 kg/m as calculated from the following:   Height as of this encounter: 5\' 9"  (1.753 m).   Weight as of this encounter: 75.1 kg.    Consultants:   None  Procedures:   None  Antimicrobials:   None   Subjective: Awake coherent in nad Less swollen no sympt afib no flutter in chest no cp no fever some constipation  Objective: Vitals:   07/27/19 1929 07/28/19 0100 07/28/19 0407 07/28/19 1057  BP: (!) 113/59  (!) 106/47 114/63  Pulse: 83  88 85  Resp: 17  17 17   Temp: 98.1 F (36.7 C)  98 F (36.7 C) 98.5 F (36.9 C)  TempSrc: Oral  Oral Oral  SpO2: 95%  94% 97%  Weight:  75.1 kg    Height:        Intake/Output Summary (Last 24 hours) at 07/28/2019 1433 Last data filed at 07/28/2019 1319 Gross per 24 hour  Intake 840 ml  Output 600 ml  Net 240 ml   Filed Weights   07/26/19 0041 07/27/19 0418 07/28/19 0100  Weight: 73.2 kg 75.5 kg 75.1 kg    Examination: No apparent distress EOMI NCAT left eyebrow with sutures CTAP Good sound no rales or rhonchi S1-S2 no murmur irregularly irregular Abdomen soft nontender no rebound no guarding No lower extremity edema    Data Reviewed: I have personally reviewed following labs and imaging studies  CBC: Recent Labs  Lab 07/24/19 1349 07/24/19 1610 07/25/19 0533 07/26/19 0641 07/27/19 0355 07/28/19 0332  WBC 10.3  --  7.8 7.0 8.3 8.2   NEUTROABS  --   --   --  4.6  --   --   HGB 12.3*  --  10.9* 9.9* 10.0* 10.8*  HCT 37.2* 33.2* 33.2* 30.0* 30.7* 33.3*  MCV 106.6*  --  105.4* 104.2* 106.2* 106.1*  PLT 242  --  220 190 221 440   Basic Metabolic Panel: Recent Labs  Lab 07/24/19 1349 07/24/19 1510 07/25/19 0533 07/26/19 0641 07/27/19 0355 07/28/19 0332  NA 137  --  138 137 139 138  K 4.0  --  3.5 3.1* 3.9 3.9  CL 104  --  109 110 113* 109  CO2 21*  --  21* 19* 19* 20*  GLUCOSE 125*  --  103* 103* 106* 87  BUN 13  --  13 13 9 11   CREATININE 1.00  --  0.91 0.91 0.76 0.94  CALCIUM 8.8*  --  8.1* 7.7* 8.0* 8.3*  MG  --  1.8  --  1.5*  --  1.8  PHOS  --  3.7  --   --   --   --  GFR: Estimated Creatinine Clearance: 63.7 mL/min (by C-G formula based on SCr of 0.94 mg/dL). Liver Function Tests: Recent Labs  Lab 07/24/19 1510  AST 24  ALT 14  ALKPHOS 96  BILITOT 0.7  PROT 6.3*  ALBUMIN 2.5*   No results for input(s): LIPASE, AMYLASE in the last 168 hours. No results for input(s): AMMONIA in the last 168 hours. Coagulation Profile: Recent Labs  Lab 07/24/19 1510  INR 1.1   Cardiac Enzymes: No results for input(s): CKTOTAL, CKMB, CKMBINDEX, TROPONINI in the last 168 hours. BNP (last 3 results) No results for input(s): PROBNP in the last 8760 hours. HbA1C: No results for input(s): HGBA1C in the last 72 hours. CBG: No results for input(s): GLUCAP in the last 168 hours. Lipid Profile: Recent Labs    07/27/19 0938  CHOL 115  HDL 49  LDLCALC 52  TRIG 71  CHOLHDL 2.3   Thyroid Function Tests: No results for input(s): TSH, T4TOTAL, FREET4, T3FREE, THYROIDAB in the last 72 hours. Anemia Panel: No results for input(s): VITAMINB12, FOLATE, FERRITIN, TIBC, IRON, RETICCTPCT in the last 72 hours. Sepsis Labs: Recent Labs  Lab 07/24/19 1610  LATICACIDVEN 1.5    Recent Results (from the past 240 hour(s))  C difficile quick scan w PCR reflex     Status: None   Collection Time: 07/24/19  3:42 PM    Specimen: STOOL  Result Value Ref Range Status   C Diff antigen NEGATIVE NEGATIVE Final   C Diff toxin NEGATIVE NEGATIVE Final   C Diff interpretation No C. difficile detected.  Final    Comment: Performed at [facility], [place] [address] [place],  [address]  Stool culture (children & immunocomp patients)     Status: None (Preliminary result)   Collection Time: 07/24/19  3:42 PM   Specimen: Perirectal; Stool  Result Value Ref Range Status   Salmonella/Shigella Screen Final report  Final   Campylobacter Culture PENDING  Incomplete   E coli, Shiga toxin Assay Negative Negative Final    Comment: (NOTE) Performed At: [facility] [place], [place] [id number] Rush Farmer MD [id number]   STOOL CULTURE REFLEX - RSASHR     Status: None   Collection Time: 07/24/19  3:42 PM  Result Value Ref Range Status   Stool Culture result 1 (RSASHR) Comment  Final    Comment: (NOTE) No Salmonella or Shigella recovered. Performed At: [facility] [place], [place] [id number] Rush Farmer MD [id number]   SARS CORONAVIRUS 2 (TAT 6-24 HRS) Nasopharyngeal Nasopharyngeal Swab     Status: None   Collection Time: 07/24/19  4:32 PM   Specimen: Nasopharyngeal Swab  Result Value Ref Range Status   SARS Coronavirus 2 NEGATIVE NEGATIVE Final    Comment: (NOTE) SARS-CoV-2 target nucleic acids are NOT DETECTED. The SARS-CoV-2 RNA is generally detectable in upper and lower respiratory specimens during the acute phase of infection. Negative results do not preclude SARS-CoV-2 infection, do not rule out co-infections with other pathogens, and should not be used as the sole basis for treatment or other patient management decisions. Negative results must be combined with clinical observations, patient history, and epidemiological information. The expected result is Negative. Fact Sheet for Patients: SugarRoll.be Fact  Sheet for Healthcare Providers: https://www.woods-mathews.com/ This test is not yet approved or cleared by the [place] FDA and  has been authorized for detection and/or diagnosis of SARS-CoV-2 by FDA under an Emergency Use Authorization (EUA). This EUA will remain  in effect (meaning this test can be used) for the duration of the COVID-19 declaration under Section 56 4(b)(1) of the Act, 21 U.S.C. section 360bbb-3(b)(1), unless the authorization is terminated or revoked sooner. Performed at [facility], [place] [address] [place], [place] [address]       Radiology Studies: No results found.  Scheduled Meds: . aspirin EC  81 mg Oral QPC supper  . folic acid  1 mg Oral QAC breakfast  . gabapentin  200 mg Oral QHS  . metoprolol tartrate  12.5 mg Oral BID  . multivitamin with minerals  1 tablet Oral QAC breakfast  . niacin  500 mg Oral BID  . polyethylene glycol  17 g Oral Daily  . rosuvastatin  20 mg Oral q1800   Continuous Infusions:    LOS: 4 days   Time spent: 15 minutes Verneita Griffes, MD [facility] 2:33 PM

## 2019-07-28 NOTE — Progress Notes (Signed)
Physical Therapy Treatment Patient Details Name: Tony [name] MRN: [id number] DOB: December 14, 1940 Today's Date: 07/28/2019    History of Present Illness Tony [name] is a 78 y.o. 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 Patient reports that he was in the bathroom on the commode and straining to have bowel movement and when he gets up to wash his hands he fell to the ground and hit on his head.  He had laceration on left side of his face. Denied headache, blurry vision, lightheadedness, dizziness, nausea, vomiting, chest pain, shortness of breath, palpitation prior to the episode.  He had loose bowel movement after the fall.  No history of diarrhea before, recent antibiotic use, recent travel, epigastric pain, nausea, vomiting, decreased appetite, melena or generalized weakness. His chemotherapy for lung cancer was held for couple of months due to fibrosis and he started on prednisone.patient is supposed to start his chemotherapy starting from this Monday. Pt in afib with low BP.     PT Comments    Pt limited by fatigue during session, reporting he was unable to sleep last night 2/2 peripheral neuropathy pain in LE. Pt demonstrates improved tolerance for ambulation during session, walking for increased distances without physical assistance. Pt does desaturate some during ambulation to 90% with tachycardia up to 130, but reports he is asymptomatic. Pt will continue to benefit from PT POC to restore modI level of mobility as family is unable to provide any physical support at home.   Follow Up Recommendations  SNF;Supervision/Assistance - 24 hour     Equipment Recommendations  Rolling walker with 5" wheels    Recommendations for  Other Services       Precautions / Restrictions Precautions Precautions: Fall Restrictions Weight Bearing Restrictions: No    Mobility  Bed Mobility Overal bed mobility: Needs Assistance Bed Mobility: Supine to Sit;Sit to Supine     Supine to sit: Supervision Sit to supine: Supervision      Transfers Overall transfer level: Needs assistance   Transfers: Sit to/from Stand Sit to Stand: Min guard;Min assist         General transfer comment: minG from bed and recliner, minA from commode with use of RW and grab bar  Ambulation/Gait Ambulation/Gait assistance: Supervision Gait Distance (Feet): 100 Feet Assistive device: Rolling walker (2 wheeled) Gait Pattern/deviations: Step-through pattern Gait velocity: reduced Gait velocity interpretation: 1.31 - 2.62 ft/sec, indicative of limited community ambulator General Gait Details: steady step through gait with slightly reduced gait speed   Stairs             Wheelchair Mobility    Modified Rankin (Stroke Patients Only)       Balance Overall balance assessment: Needs assistance Sitting-balance support: No upper extremity supported;Feet supported Sitting balance-Leahy Scale: Good Sitting balance - Comments: Independent   Standing balance support: Single extremity supported;During functional activity Standing balance-Leahy Scale: Good Standing balance comment: supervision                            Cognition Arousal/Alertness: Awake/alert Behavior During Therapy: WFL for tasks assessed/performed Overall Cognitive Status: Within Functional Limits for tasks assessed  Exercises      General Comments General comments (skin integrity, edema, etc.): Pt with SpO2 dropping to 90 during ambulation and 88% shortly after cessation, recovers quickly to 93% withi 30 seconds-1 minute. Pt reports no symptoms      Pertinent Vitals/Pain Pain  Assessment: No/denies pain    Home Living                      Prior Function            PT Goals (current goals can now be found in the care plan section) Progress towards PT goals: Progressing toward goals    Frequency    Min 2X/week      PT Plan Frequency needs to be updated    Co-evaluation              AM-PAC PT "6 Clicks" Mobility   Outcome Measure  Help needed turning from your back to your side while in a flat bed without using bedrails?: None Help needed moving from lying on your back to sitting on the side of a flat bed without using bedrails?: None Help needed moving to and from a bed to a chair (including a wheelchair)?: A Little Help needed standing up from a chair using your arms (e.g., wheelchair or bedside chair)?: A Little Help needed to walk in hospital room?: None Help needed climbing 3-5 steps with a railing? : A Lot 6 Click Score: 20    End of Session Equipment Utilized During Treatment: (none) Activity Tolerance: Patient limited by fatigue Patient left: in bed;with call bell/phone within reach Nurse Communication: Mobility status PT Visit Diagnosis: Unsteadiness on feet (R26.81);Muscle weakness (generalized) (M62.81)     Time: 1423-9532 PT Time Calculation (min) (ACUTE ONLY): 14 min  Charges:  $Gait Training: 8-22 mins                     Zenaida Niece, PT, DPT Acute Rehabilitation Pager: [phone]    Zenaida Niece 07/28/2019, 10:31 AM

## 2019-07-28 NOTE — NC FL2 (Signed)
[place] LEVEL OF CARE SCREENING TOOL     IDENTIFICATION  Patient Name: Tony [name] Birthdate: 07-30-1941 Sex: male Admission Date (Current Location): 07/24/2019  [facility] and [place] Number:  Herbalist and Address:  [facility], [place] [address], [place], [place] [address]      Provider Number: [id number]  Attending Physician Name and Address:  Nita Sells, MD  Relative Name and Phone Number:  Audrea Muscat (spouse) [phone]    Current Level of Care: Hospital Recommended Level of Care: [place] Prior Approval Number:    Date Approved/Denied:   PASRR Number: [id number]  Discharge Plan: SNF    Current Diagnoses: Patient Active Problem List   Diagnosis Date Noted  . Syncope 07/24/2019  . Macrocytosis 07/24/2019  . New onset atrial fibrillation ([place]) 07/24/2019  . Laceration of face 07/24/2019  . Anemia due to antineoplastic chemotherapy 03/16/2019  . Protein calorie malnutrition ([place]) 10/27/2018  . Constipation 10/27/2018  . Port-A-Cath in place 10/20/2018  . CAD (coronary artery disease) 10/13/2018  . Hx of CABG 10/13/2018  . Contusion of head 10/13/2018  . HTN (hypertension) 10/13/2018  . HLD (hyperlipidemia) 10/13/2018  . Encounter for antineoplastic chemotherapy 08/27/2018  . Encounter for antineoplastic immunotherapy 08/27/2018  . Goals of care, counseling/discussion 08/27/2018  . Brain metastases ([place]) 08/26/2018  . Adenocarcinoma, lung, right ([place]) 08/14/2018  . Bone metastasis ([place]) 07/30/2018  . Thrombocytopenia ([place]) 08/10/2012  . 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 Numbness and tingling 07/28/2012    Orientation RESPIRATION BLADDER Height & Weight     Self, Time, Situation, Place  Normal Continent Weight: 165 lb 8 oz (75.1 kg)(Scale C) Height:  5\' 9"  (175.3 cm)  BEHAVIORAL SYMPTOMS/MOOD NEUROLOGICAL BOWEL NUTRITION STATUS      Continent Diet(see  discharge summary)  AMBULATORY STATUS COMMUNICATION OF NEEDS Skin   Limited Assist Verbally Other (Comment), Skin abrasions(ecchymosis head, abrasion right hand)                       Personal Care Assistance Level of Assistance  Bathing, Feeding, Dressing, Total care Bathing Assistance: Limited assistance Feeding assistance: Independent Dressing Assistance: Limited assistance Total Care Assistance: Limited assistance   Functional Limitations Info  Sight, Speech, Hearing Sight Info: Adequate Hearing Info: Adequate Speech Info: Adequate    SPECIAL CARE FACTORS FREQUENCY  PT (By licensed PT), OT (By licensed OT)     PT Frequency: min 5x weekly OT Frequency: min 5x weekly            Contractures Contractures Info: Not present    Additional Factors Info  Code Status, Allergies Code Status Info: full Allergies Info: no known allergies           Current Medications (07/28/2019):  This is the current hospital active medication list Current Facility-Administered Medications  Medication Dose Route Frequency Provider Last Rate Last Dose  . acetaminophen (TYLENOL) tablet 650 mg  650 mg Oral Q6H PRN Pahwani, Rinka R, MD       Or  . acetaminophen (TYLENOL) suppository 650 mg  650 mg Rectal Q6H PRN Pahwani, Rinka R, MD      . aspirin EC tablet 81 mg  81 mg Oral QPC supper Pahwani, Rinka R, MD   81 mg at 07/27/19 1644  . folic acid (FOLVITE) tablet 1 mg  1 mg Oral QAC breakfast Pahwani, Rinka R, MD   1 mg at 07/28/19 0807  .  gabapentin (NEURONTIN) capsule 200 mg  200 mg Oral QHS Pahwani, Rinka R, MD   200 mg at 07/27/19 2246  . metoprolol tartrate (LOPRESSOR) tablet 12.5 mg  12.5 mg Oral BID Reino Bellis B, NP   12.5 mg at 07/28/19 0805  . multivitamin with minerals tablet 1 tablet  1 tablet Oral QAC breakfast Pahwani, Rinka R, MD   1 tablet at 07/28/19 0807  . niacin tablet 500 mg  500 mg Oral BID Pahwani, Rinka R, MD   500 mg at 07/28/19 0806  . ondansetron (ZOFRAN)  tablet 4 mg  4 mg Oral Q6H PRN Pahwani, Rinka R, MD       Or  . ondansetron (ZOFRAN) injection 4 mg  4 mg Intravenous Q6H PRN Pahwani, Rinka R, MD      . pantoprazole (PROTONIX) EC tablet 40 mg  40 mg Oral Daily PRN Pahwani, Rinka R, MD      . polyethylene glycol (MIRALAX / GLYCOLAX) packet 17 g  17 g Oral Daily PRN Pahwani, Rinka R, MD   17 g at 07/26/19 1511  . polyethylene glycol (MIRALAX / GLYCOLAX) packet 17 g  17 g Oral Daily Samtani, Jai-Gurmukh, MD      . rosuvastatin (CRESTOR) tablet 20 mg  20 mg Oral q1800 Reino Bellis B, NP   20 mg at 07/27/19 1644     Discharge Medications: Please see discharge summary for a list of discharge medications.  Relevant Imaging Results:  Relevant Lab Results:   Additional Information SSN: [id number]  Alberteen Sam, LCSW

## 2019-07-28 NOTE — Progress Notes (Addendum)
Progress Note  Patient Name: Tony [name] Date of Encounter: 07/28/2019  Primary Cardiologist: Larae Grooms, MD   Subjective   Feeling better today. Ambulated in the hallway this morning.   Inpatient Medications    Scheduled Meds: . aspirin EC  81 mg Oral QPC supper  . folic acid  1 mg Oral QAC breakfast  . gabapentin  200 mg Oral QHS  . metoprolol tartrate  12.5 mg Oral BID  . multivitamin with minerals  1 tablet Oral QAC breakfast  . niacin  500 mg Oral BID  . rosuvastatin  20 mg Oral q1800   Continuous Infusions:  PRN Meds: acetaminophen **OR** acetaminophen, ondansetron **OR** ondansetron (ZOFRAN) IV, pantoprazole, polyethylene glycol   Vital Signs    Vitals:   07/27/19 1929 07/28/19 0100 07/28/19 0407 07/28/19 1057  BP: (!) 113/59  (!) 106/47 114/63  Pulse: 83  88 85  Resp: 17  17 17   Temp: 98.1 F (36.7 C)  98 F (36.7 C) 98.5 F (36.9 C)  TempSrc: Oral  Oral Oral  SpO2: 95%  94% 97%  Weight:  75.1 kg    Height:        Intake/Output Summary (Last 24 hours) at 07/28/2019 1147 Last data filed at 07/28/2019 0814 Gross per 24 hour  Intake 600 ml  Output 600 ml  Net 0 ml   Last 3 Weights 07/28/2019 07/27/2019 07/26/2019  Weight (lbs) 165 lb 8 oz 166 lb 6.4 oz 161 lb 6.4 oz  Weight (kg) 75.07 kg 75.479 kg 73.211 kg      Telemetry    SR, some ST - Personally Reviewed  ECG    No new tracing  Physical Exam  Older WM, sitting up in bed. GEN: No acute distress.   Neck: No JVD Cardiac: RRR, no murmurs, rubs, or gallops.  Respiratory: Clear to auscultation bilaterally. GI: Soft, nontender, non-distended  MS: No edema; No deformity. Neuro:  Nonfocal  Psych: Normal affect   Labs    High Sensitivity Troponin:   Recent Labs  Lab 07/24/19 1349 07/24/19 1510  TROPONINIHS 13 13      Chemistry Recent Labs  Lab 07/24/19 1510  07/26/19 0641 07/27/19 0355 07/28/19 0332  NA  --    < > 137 139 138  K  --    < > 3.1* 3.9 3.9  CL  --    <  > 110 113* 109  CO2  --    < > 19* 19* 20*  GLUCOSE  --    < > 103* 106* 87  BUN  --    < > 13 9 11   CREATININE  --    < > 0.91 0.76 0.94  CALCIUM  --    < > 7.7* 8.0* 8.3*  PROT 6.3*  --   --   --   --   ALBUMIN 2.5*  --   --   --   --   AST 24  --   --   --   --   ALT 14  --   --   --   --   ALKPHOS 96  --   --   --   --   BILITOT 0.7  --   --   --   --   GFRNONAA  --    < > >60 >60 >60  GFRAA  --    < > >60 >60 >60  ANIONGAP  --    < >  8 7 9    < > = values in this interval not displayed.     Hematology Recent Labs  Lab 07/26/19 0641 07/27/19 0355 07/28/19 0332  WBC 7.0 8.3 8.2  RBC 2.88* 2.89* 3.14*  HGB 9.9* 10.0* 10.8*  HCT 30.0* 30.7* 33.3*  MCV 104.2* 106.2* 106.1*  MCH 34.4* 34.6* 34.4*  MCHC 33.0 32.6 32.4  RDW 14.7 14.8 14.7  PLT 190 221 230    BNPNo results for input(s): BNP, PROBNP in the last 168 hours.   DDimer No results for input(s): DDIMER in the last 168 hours.   Radiology    No results found.  Cardiac Studies   TTE: 07/25/19  IMPRESSIONS   1. Technically difficult study. Images are not adequate to assess regional wall motion, consider repeat lmited TTE with contrast. 2. Left ventricular ejection fraction, by visual estimation, is 40 to 45%. The left ventricle has mild to moderately decreased function. There is mildly increased left ventricular hypertrophy. Appears to be basal to mid inferolateral/inferior  hypokinesis 3. Left ventricular diastolic parameters are consistent with Grade I diastolic dysfunction (impaired relaxation). 4. Left atrial size was normal. 5. Right atrial size was normal. 6. The mitral valve is normal in structure. No evidence of mitral valve regurgitation. 7. The tricuspid valve is not well visualized. Tricuspid valve regurgitation is not demonstrated. 8. The aortic valve was not well visualized. Aortic valve regurgitation is not visualized. 9. The pulmonic valve was not well visualized. Pulmonic valve  regurgitation is not visualized. 10. The inferior vena cava is normal in size with greater than 50% respiratory variability, suggesting right atrial pressure of 3 mmHg. 11. Global right ventricle has normal systolic function.The right ventricular size is normal. No increase in right ventricular wall thickness. 12. There is mild dilatation of the aortic root measuring 38 mm.  Patient Profile     78 y.o. male with a hx of HTN, HL, stage IV adenocarcinoma of lung, CAD s/p 4vCABG, anemia, PVD and AAAwho was seen for the evaluation of Afib/syncopeat the request of Dr. Roderic Palau.  Assessment & Plan     Syncope: seems vasovagal in nature, likely worsened by dehydration. Was straining to have a bowel and passed out when he stood up. Blood pressures were soft on admission. Improved with IVFs. Blood pressures are stable after addition of low dose BB added back yesterday.   2. New onset Afib: noted on admission, rate controlled. He has converted to SR and now maintaining.This patients CHA2DS2-VASc Scoreof at least 4. Discussed options regarding [place] with him but he is very hesitant to consider given his GI bleed around time of his CABG and Ca. He is chronically anemic as well, last transfusion was about 3 months prior. Platelet count is stable.TSH normal. Given he is maintaining SR no plans for [facility] at this time. -- plan for cardiac monitor at DC.  3. Stage IV lung Ca with metastasis: followed by Dr. Inda Merlin. Currently on palliative chemotherapy.   4. CAD s/p 4vCABG: has been maintained on ASA and statin prior to admission. No chest pain. Negative HsT  5. Anemia with Hx of GI bleed:Hgb 12.3>>9.9>>10.0 but suspect some dilution as he was given IVFs. Does report intermittent episodes of bright red blood but has hemorrhoids.   6. Hypokalemia/hypoymag:resolved.  7. HL: reports trying several different statins in the past with hx of intolerance. Agreeable to adjust statin.  [facility] will  sign off.   Medication Recommendations:  Noted above Other recommendations (labs, testing, etc):  none Follow up as an outpatient:  Outpatient monitor at time of discharge.   For questions or updates, please contact [place] Please consult [email or web address] for contact info under        Signed, Reino Bellis, NP  07/28/2019, 11:47 AM     Patient seen and examined. Agree with assessment and plan.  Patient feels improved.  Maintaining sinus rhythm, low-dose beta-blocker started yesterday with metoprolol 12.5 mg twice daily which he is tolerating.  As pain or recurrent episodes of presyncope.  He states that when discharged he will go to short-term SNF.  Recommend outpatient cardiac monitoring.  As noted above will sign off.   Troy Sine, MD, [facility] 07/28/2019 12:00 PM

## 2019-07-28 NOTE — Plan of Care (Signed)

## 2019-07-29 LAB — STOOL CULTURE: E coli, Shiga toxin Assay: NEGATIVE

## 2019-07-29 LAB — STOOL CULTURE REFLEX - RSASHR

## 2019-07-29 LAB — SARS CORONAVIRUS 2 (TAT 6-24 HRS): SARS Coronavirus 2: NEGATIVE

## 2019-07-29 LAB — STOOL CULTURE REFLEX - CMPCXR

## 2019-07-29 MED ORDER — METOPROLOL TARTRATE 25 MG PO TABS: 12.5000 mg | ORAL_TABLET | Freq: Two times a day (BID) | ORAL | Status: AC

## 2019-07-29 NOTE — TOC Transition Note (Signed)
Transition of Care [facility]) - CM/SW Discharge Note   Patient Details  Name: Tony [name] MRN: [id number] Date of Birth: Jul 24, 1941  Transition of Care [facility]) CM/SW Contact:  Alberteen Sam, [place] Phone Number: [phone] 07/29/2019, 12:59 PM   Clinical Narrative:     Patient will DC to: [facility] Anticipated DC date: 07/29/2019 Family notified: Audrea Muscat Transport by: Corey Harold  Per MD patient ready for DC to [facility] . RN, patient, patient's family, and facility notified of DC. Discharge Summary sent to facility. RN given number for report   [phone] Room 3217 . DC packet on chart. Ambulance transport requested for patient for 3:00 pm per facility request.  CSW signing off.  [place], [place]   Final next level of care: Skilled Nursing Facility Barriers to Discharge: No Barriers Identified   Patient Goals and CMS Choice Patient states their goals for this hospitalization and ongoing recovery are:: [facility] before home CMS Medicare.gov Compare Post Acute Care list provided to:: Patient Choice offered to / list presented to : Patient  Discharge Placement PASRR number recieved: 07/28/19            Patient chooses bed at: [facility] Patient to be transferred to facility by: [place] Name of family member notified: Audrea Muscat (spouse) Patient and family notified of of transfer: 07/29/19  Discharge Plan and Services     Post Acute Care Choice: [place]                               Social Determinants of Health (SDOH) Interventions     Readmission Risk Interventions No flowsheet data found.

## 2019-07-29 NOTE — TOC Progression Note (Signed)
Transition of Care [facility]) - Progression Note    Patient Details  Name: Tony [name] MRN: [id number] Date of Birth: 11-23-1940  [facility]) CM/SW Contact  Zenon Mayo, RN Phone Number: 07/29/2019, 12:52 PM  Clinical Narrative:    NCM received [name]  From [facility] [id number], ref number [phone], for 5 days from 11/4 to 11/8,  [name] is coodinator for updated clinicals fax to [phone].   Expected Discharge Plan: [place] Barriers to Discharge: Continued Medical Work up  Expected Discharge Plan and Services Expected Discharge Plan: [name] Choice: [place] arrangements for the past 2 months: Single Family Home Expected Discharge Date: 07/29/19                                     Social Determinants of Health (SDOH) Interventions    Readmission Risk Interventions No flowsheet data found.

## 2019-07-29 NOTE — Care Management Important Message (Signed)
Important Message  Patient Details  Name: Tony [name] MRN: [id number] Date of Birth: [birth year]   Medicare Important Message Given:  Yes     [name] 07/29/2019, 1:02 PM

## 2019-07-29 NOTE — Progress Notes (Signed)
Called report to [facility], spoke with Kennon Portela. Awaiting transport.

## 2019-07-29 NOTE — Discharge Summary (Signed)
Physician Discharge Summary  MATILDE MARKIE OIN:[id number] DOB: October 23, 1940 DOA: 07/24/2019  PCP: [name], MD  Admit date: 07/24/2019 Discharge date: 07/29/2019  Time spent: 35 minutes  Recommendations for Outpatient Follow-up:  1. Please continue to have outpatient discussions with patient regarding coagulation-he has a history of new onset weakness but declines anticoagulation because of bleeding in the remote past when he had his heart attacks and had internal bleeding-he will need further education about the same 2. Needs CBC plus Chem-12 in about 1 week 3. Please schedule outpatient follow-up with Dr. Quintella Reichert who will be CCed on this note with regards to his stage IV adenocarcinoma lung-he is to resume therapy whenever okayed by Dr. Lindi Adie 4. Sutures on face to be removed on 08/01/2019 5. Please note medication changes 6. Please arrange for 30-day cardiac monitor outpatient setting  Discharge Diagnoses:  Principal Problem:   Syncope Active Problems:   Adenocarcinoma, lung, right (HCC)   CAD (coronary artery disease)   Hx of CABG   HTN (hypertension)   HLD (hyperlipidemia)   Macrocytosis   New onset atrial fibrillation (HCC)   Laceration of face   Discharge Condition: Improved  Diet recommendation: Heart healthy  Filed Weights   07/27/19 0418 07/28/19 0100 07/29/19 0300  Weight: 75.5 kg 75.1 kg 74.8 kg    History of present illness:  32 white male prior to his CABG 2000 HTN HLD stage IV adeno CA lung status post XRT chemo, prostate cancer status post prostatectomy prior GI bleeding admitted with syncope fall and bruise to face as well as laceration to left temple came to emergency room found to have A. fib RVR new onset CT head cervical spine negative Cardiology consulted   Hospital Course:  Syncope: Vasovagal versus orthostatic Echocardiogram shows ejection fraction of 40 to 45% which is similar to prior readings. Volume depleted on admission therefore given  IV fluids and this resolved he was ambulatory in the hallways prior to discharge  New onset vs newly diagnosed A. Fib: -Rate controlled. On telemetry.CHA2DS2-VASc: 4 -Patient does not want tostart onanticoagulation-due to prior history of GI bleed.  TSH normal.  Heart rate is controlled.  Started on metoprolol-he will need a cardiac monitor per cardiologist on discharge which is to be arranged by them  Diarrhea: Started after admission.  Patient now has constipation and will need bowel regimen on follow-up  Coronary artery disease status post CABG: Stable.  Asymptomatic.  Continue aspirin and statin.  Hypertension.  Blood pressure was initially low on admission.  He was on a thiazide and ACE inhibitor which has been discontinued  Stage IV adenocarcinoma of lung: S/p chemo and radiation therapy: -His chemotherapy was held for couple of months due to fibrosisand was started on prednisone daily. -Patient is supposed to start on chemo 11/3 and I am forwarding to Dr. Lindi Adie to coordinate outpatient follow-up and management  Laceration of face: On left side -Repaired in ED -Tylenol as needed for pain control. -sutures to be removed in on 08/01/2019  Hypokalemia/hypomagnesemia. Likely related to diuretics. replaced  Macrocytosis: -H&H is currently stable.  B12 normal.  Folate high.  TSH normal.  History of prostate cancer: Status post prostatectomy  History of GI bleed/ulcer: No signs of bleeding.  Hemoglobin dropped from 12.3-10, although he was likely hemoconcentrated on admission.  Continue PPI.  Hemoglobin appears to be stabilizing  Generalized weakness/falls.    Will need skilled facility placement   Procedures: Echocardiogram  Consultations:  Cardiology  Discharge Exam: Vitals:  07/29/19 0023 07/29/19 0341  BP: 122/69 133/72  Pulse: 86 (!) 101  Resp: 18 18  Temp: 98.9 F (37.2 C) 98.2 F (36.8 C)  SpO2: 95% 96%    General: Awake alert coherent no  distress EOMI NCAT no focal deficit left temple seems to be healing well bruising to the left maxilla Cardiovascular: S1-S2 seems to be in sinus rhythm Respiratory: Clinically clear no added sound no rales no rhonchi  Discharge Instructions   Discharge Instructions    Diet - low sodium heart healthy   Complete by: As directed    Increase activity slowly   Complete by: As directed      Allergies as of 07/29/2019   No Known Allergies     Medication List    STOP taking these medications   ramipril 10 MG capsule Commonly known as: ALTACE   triamterene-hydrochlorothiazide 37.5-25 MG tablet Commonly known as: MAXZIDE-25     TAKE these medications   aspirin EC 81 MG tablet Take 81 mg by mouth daily after supper.   colchicine 0.6 MG tablet Take 0.6 mg by mouth daily as needed (gout attacks).   Fish Oil 1200 MG Caps Take 1,200 mg by mouth daily before breakfast.   folic acid 102 MCG tablet Commonly known as: FOLVITE Take 800 mcg by mouth daily before breakfast.   gabapentin 100 MG capsule Commonly known as: NEURONTIN TAKE 2 CAPSULES BY MOUTH AT BEDTIME   metoprolol tartrate 25 MG tablet Commonly known as: LOPRESSOR Take 0.5 tablets (12.5 mg total) by mouth 2 (two) times daily. What changed: how much to take   multivitamin with minerals tablet Take 1 tablet by mouth daily before breakfast.   niacin 500 MG tablet Take 500 mg by mouth 2 (two) times daily.   omeprazole 20 MG tablet Commonly known as: PRILOSEC OTC Take 20 mg by mouth daily as needed (acid reflux).   polyethylene glycol 17 g packet Commonly known as: MIRALAX / GLYCOLAX Take 17 g by mouth daily as needed for mild constipation.   pravastatin 10 MG tablet Commonly known as: PRAVACHOL Take 10 mg by mouth daily after supper.      No Known Allergies    The results of significant diagnostics from this hospitalization (including imaging, microbiology, ancillary and laboratory) are listed below for  reference.    Significant Diagnostic Studies: Dg Chest 2 View  Result Date: 07/24/2019 CLINICAL DATA:  Pt arrived via [facility] from home. Pt was on toilet trying to have BM that was unsuccessful, pt went to sink and became dizzy and fell and hit head on floor. Pt denies LOC, pt denies blood thinners. Pt states hx of stage 3 lung cancer, states they are changing his chemo meds on Monday. Pt hx MI, HTN, CAD. Pt is a former smoker EXAM: CHEST - 2 VIEW COMPARISON:  09/24/2018 FINDINGS: Stable changes from prior CABG surgery. Cardiac silhouette is normal in size. No mediastinal or hilar masses. Coarsely thickened interstitial markings bilaterally. There is hazy opacity at the right lung base partly obscuring the hemidiaphragm. Suspect a small effusion with atelectasis. No convincing pneumonia. No pneumothorax. Left anterior chest wall Port-A-Cath tip projects in the mid superior vena cava. Skeletal structures are grossly intact. IMPRESSION: 1. No acute findings. 2. Interstitial thickening consistent interstitial lung disease. 3. Mild opacity at the right lung base is likely due to a small effusion with atelectasis. Electronically Signed   By: Lajean Manes M.D.   On: 07/24/2019 14:49   Ct Head  Wo Contrast  Result Date: 07/24/2019 CLINICAL DATA:  Fall, head injury, laceration above left eye EXAM: CT HEAD WITHOUT CONTRAST CT CERVICAL SPINE WITHOUT CONTRAST TECHNIQUE: Multidetector CT imaging of the head and cervical spine was performed following the standard protocol without intravenous contrast. Multiplanar CT image reconstructions of the cervical spine were also generated. COMPARISON:  10/14/2018 FINDINGS: CT HEAD FINDINGS Brain: No evidence of acute infarction, hemorrhage, hydrocephalus, extra-axial collection or mass lesion/mass effect. Periventricular white matter hypodensity. Vascular: No hyperdense vessel or unexpected calcification. Skull: Normal. Negative for fracture or focal lesion. Sinuses/Orbits: No  acute finding. Other: Soft tissue laceration and hematoma of the left cheek and forehead. CT CERVICAL SPINE FINDINGS Alignment: Normal. Skull base and vertebrae: No acute fracture. No primary bone lesion or focal pathologic process. 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 Soft tissues and spinal canal: No prevertebral fluid or swelling. No visible canal hematoma. Disc levels: Moderate disc space height loss and osteophytosis at C4 through C6. Disc spaces are otherwise preserved. Upper chest: Negative. Other: None. IMPRESSION: 1.  No acute intracranial pathology. 2. Soft tissue laceration and hematoma of the left cheek and forehead. 3.  No fracture or static subluxation of the cervical spine. Electronically Signed   By: Eddie Candle M.D.   On: 07/24/2019 14:52   Ct Chest W Contrast  Result Date: 07/16/2019 CLINICAL DATA:  Non-small-cell lung cancer diagnosed in 2019. Staging. Prostate cancer in 2012. Chemotherapy in progress. Radiation therapy complete. Chronic constipation. EXAM: CT CHEST, ABDOMEN, AND PELVIS WITH CONTRAST TECHNIQUE: Multidetector CT imaging of the chest, abdomen and pelvis was performed following the standard protocol during bolus administration of intravenous contrast. CONTRAST:  143mL OMNIPAQUE IOHEXOL 300 MG/ML  SOLN COMPARISON:  05/14/2019 FINDINGS: CT CHEST FINDINGS Cardiovascular: Left Port-A-Cath which terminates at the high SVC, similar. Aortic and branch vessel atherosclerosis. Tortuous thoracic aorta. Mild cardiomegaly with native coronary artery atherosclerosis, status post CABG. No central pulmonary embolism, on this non-dedicated study. Mediastinum/Nodes: No supraclavicular adenopathy. A node within the azygoesophageal recess measures 8 mm today versus 6 mm on the prior. Image 29/2 today. No hilar adenopathy. Lungs/Pleura: Tiny bilateral pleural effusions are not significantly changed. Medial right lower lobe pulmonary nodule  measures on the order of 2.8 x 2.2 cm including on 74/6. Compare 2.7 x 2.3 cm at the same level on the prior exam (when remeasured). More inferiorly and medially, contiguous nodularity is progressive. Example on 90/6 at 2.0 x 1.8 cm. Compare 1.6 x 1.4 cm at the same level on the prior. Progression is also readily apparent on coronal image 93 today versus coronal image 95 of the prior. On the order of 5.4 x 2.2 cm today versus 4.4 by 2.2 cm on the prior exam (when remeasured). No significant change in interstitial coarsening with areas of mild architectural distortion and traction bronchiectasis. These are most significant in the right lower lobe but present bilaterally. Areas of nodularity are similar less conspicuous today. Previously described nodule along the right major fissure measures 7 mm on 79/6 versus 1.0 cm on the prior. Musculoskeletal: Similar configuration of sclerotic osseous metastasis, including within the right glenoid. CT ABDOMEN PELVIS FINDINGS Hepatobiliary: Too small to characterize liver lesions are unchanged. A hypoattenuating lateral segment left liver lobe lesion is similar at 1.1 cm, slightly greater than fluid density. Normal gallbladder, without biliary ductal dilatation. Pancreas: Normal, without mass or ductal dilatation. Spleen: Normal in size, without focal abnormality. Adrenals/Urinary Tract: Normal adrenal glands. Mild  renal cortical thinning bilaterally. Left renal lesions which are likely cysts and minimally complex cysts. Others are too small to characterize. No hydronephrosis. The bladder wall appears mildly thickened, but is underdistended. Stomach/Bowel: Normal stomach, without wall thickening. Scattered colonic diverticula. Normal terminal ileum and appendix. Normal small bowel. Vascular/Lymphatic: Advanced aortic and branch vessel atherosclerosis. Nonaneurysmal dilatation of the infrarenal aorta at 2.9 cm, similar. 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 No abdominopelvic adenopathy. Reproductive: Prostatectomy, without locally recurrent disease. Other: No significant free fluid. Musculoskeletal: Osteopenia. Similar appearance of right-sided L1 vertebral body and posterior element sclerotic metastasis. There is also a similar sclerotic lesion within the S1 vertebral body. IMPRESSION: 1. Progressive disease within the right lower lobe, as evidenced by increase size of a dominant lesion, primarily inferiorly and medially. 2. 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 Given stability and morphology, favor drug toxicity. Lymphangitic tumor spread felt less likely. 3. Slight enlargement of a mediastinal node which could be reactive or metastatic. 4. Similar osseous metastasis. 5. No evidence of soft tissue metastasis within the abdomen or pelvis. 6. Similar small bilateral pleural effusions. 7. No change in too small to characterize and indeterminate liver lesions. 8. Coronary artery atherosclerosis. Aortic Atherosclerosis (ICD10-I70.0). Electronically Signed   By: Abigail Miyamoto M.D.   On: 07/16/2019 15:11   Ct Cervical Spine Wo Contrast  Result Date: 07/24/2019 CLINICAL DATA:  Fall, head injury, laceration above left eye EXAM: CT HEAD WITHOUT CONTRAST CT CERVICAL SPINE WITHOUT CONTRAST TECHNIQUE: Multidetector CT imaging of the head and cervical spine was performed following the standard protocol without intravenous contrast. Multiplanar CT image reconstructions of the cervical spine were also generated. COMPARISON:  10/14/2018 FINDINGS: CT HEAD FINDINGS Brain: No evidence of acute infarction, hemorrhage, hydrocephalus, extra-axial collection or mass lesion/mass effect. Periventricular white matter hypodensity. Vascular: No hyperdense vessel or unexpected calcification. Skull: Normal. Negative for fracture or focal lesion. Sinuses/Orbits: No acute finding. Other: Soft tissue  laceration and hematoma of the left cheek and forehead. CT CERVICAL SPINE FINDINGS Alignment: Normal. Skull base and vertebrae: No acute fracture. No primary bone lesion or focal pathologic process. 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 Soft tissues and spinal canal: No prevertebral fluid or swelling. No visible canal hematoma. Disc levels: Moderate disc space height loss and osteophytosis at C4 through C6. Disc spaces are otherwise preserved. Upper chest: Negative. Other: None. IMPRESSION: 1.  No acute intracranial pathology. 2. Soft tissue laceration and hematoma of the left cheek and forehead. 3.  No fracture or static subluxation of the cervical spine. Electronically Signed   By: Eddie Candle M.D.   On: 07/24/2019 14:52   Ct Abdomen Pelvis W Contrast  Result Date: 07/16/2019 CLINICAL DATA:  Non-small-cell lung cancer diagnosed in 2019. Staging. Prostate cancer in 2012. Chemotherapy in progress. Radiation therapy complete. Chronic constipation. EXAM: CT CHEST, ABDOMEN, AND PELVIS WITH CONTRAST TECHNIQUE: Multidetector CT imaging of the chest, abdomen and pelvis was performed following the standard protocol during bolus administration of intravenous contrast. CONTRAST:  15mL OMNIPAQUE IOHEXOL 300 MG/ML  SOLN COMPARISON:  05/14/2019 FINDINGS: CT CHEST FINDINGS Cardiovascular: Left Port-A-Cath which terminates at the high SVC, similar. Aortic and branch vessel atherosclerosis. Tortuous thoracic aorta. Mild cardiomegaly with native coronary artery atherosclerosis, status post CABG. No central pulmonary embolism, on this non-dedicated study. Mediastinum/Nodes: No supraclavicular adenopathy. A node within the azygoesophageal recess measures 8  mm today versus 6 mm on the prior. Image 29/2 today. No hilar adenopathy. Lungs/Pleura: Tiny bilateral pleural effusions are not significantly changed. Medial right lower lobe pulmonary nodule measures on the order of 2.8  x 2.2 cm including on 74/6. Compare 2.7 x 2.3 cm at the same level on the prior exam (when remeasured). More inferiorly and medially, contiguous nodularity is progressive. Example on 90/6 at 2.0 x 1.8 cm. Compare 1.6 x 1.4 cm at the same level on the prior. Progression is also readily apparent on coronal image 93 today versus coronal image 95 of the prior. On the order of 5.4 x 2.2 cm today versus 4.4 by 2.2 cm on the prior exam (when remeasured). No significant change in interstitial coarsening with areas of mild architectural distortion and traction bronchiectasis. These are most significant in the right lower lobe but present bilaterally. Areas of nodularity are similar less conspicuous today. Previously described nodule along the right major fissure measures 7 mm on 79/6 versus 1.0 cm on the prior. Musculoskeletal: Similar configuration of sclerotic osseous metastasis, including within the right glenoid. CT ABDOMEN PELVIS FINDINGS Hepatobiliary: Too small to characterize liver lesions are unchanged. A hypoattenuating lateral segment left liver lobe lesion is similar at 1.1 cm, slightly greater than fluid density. Normal gallbladder, without biliary ductal dilatation. Pancreas: Normal, without mass or ductal dilatation. Spleen: Normal in size, without focal abnormality. Adrenals/Urinary Tract: Normal adrenal glands. Mild renal cortical thinning bilaterally. Left renal lesions which are likely cysts and minimally complex cysts. Others are too small to characterize. No hydronephrosis. The bladder wall appears mildly thickened, but is underdistended. Stomach/Bowel: Normal stomach, without wall thickening. Scattered colonic diverticula. Normal terminal ileum and appendix. Normal small bowel. Vascular/Lymphatic: Advanced aortic and branch vessel atherosclerosis. Nonaneurysmal dilatation of the infrarenal aorta at 2.9 cm, similar. 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 No abdominopelvic adenopathy. Reproductive: Prostatectomy, without locally recurrent disease. Other: No significant free fluid. Musculoskeletal: Osteopenia. Similar appearance of right-sided L1 vertebral body and posterior element sclerotic metastasis. There is also a similar sclerotic lesion within the S1 vertebral body. IMPRESSION: 1. Progressive disease within the right lower lobe, as evidenced by increase size of a dominant lesion, primarily inferiorly and medially. 2. 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 Given stability and morphology, favor drug toxicity. Lymphangitic tumor spread felt less likely. 3. Slight enlargement of a mediastinal node which could be reactive or metastatic. 4. Similar osseous metastasis. 5. No evidence of soft tissue metastasis within the abdomen or pelvis. 6. Similar small bilateral pleural effusions. 7. No change in too small to characterize and indeterminate liver lesions. 8. Coronary artery atherosclerosis. Aortic Atherosclerosis (ICD10-I70.0). Electronically Signed   By: Abigail Miyamoto M.D.   On: 07/16/2019 15:11    Microbiology: Recent Results (from the past 240 hour(s))  C difficile quick scan w PCR reflex     Status: None   Collection Time: 07/24/19  3:42 PM   Specimen: STOOL  Result Value Ref Range Status   C Diff antigen NEGATIVE NEGATIVE Final   C Diff toxin NEGATIVE NEGATIVE Final   C Diff interpretation No C. difficile detected.  Final    Comment: Performed at [facility], [place] [address] [place], [place] [address]  Stool culture (children & immunocomp patients)     Status: None (Preliminary result)   Collection Time: 07/24/19  3:42 PM   Specimen: Perirectal; Stool  Result Value Ref Range Status   Salmonella/Shigella Screen Final report  Final   Campylobacter Culture PENDING  Incomplete   E coli, Shiga toxin Assay Negative Negative Final    Comment: (NOTE) Performed At: [facility] [address] [place], [place] [id number] Rush Farmer MD AJ:[id number]   STOOL CULTURE REFLEX - RSASHR     Status: None   Collection Time: 07/24/19  3:42 PM  Result Value Ref Range Status   Stool Culture result 1 (RSASHR) Comment  Final    Comment: (NOTE) No Salmonella or Shigella recovered. Performed At: [facility] [place], [place] [id number] Rush Farmer MD MO:[id number]   SARS CORONAVIRUS 2 (TAT 6-24 HRS) Nasopharyngeal Nasopharyngeal Swab     Status: None   Collection Time: 07/24/19  4:32 PM   Specimen: Nasopharyngeal Swab  Result Value Ref Range Status   SARS Coronavirus 2 NEGATIVE NEGATIVE Final    Comment: (NOTE) SARS-CoV-2 target nucleic acids are NOT DETECTED. The SARS-CoV-2 RNA is generally detectable in upper and lower respiratory specimens during the acute phase of infection. Negative results do not preclude SARS-CoV-2 infection, do not rule out co-infections with other pathogens, and should not be used as the sole basis for treatment or other patient management decisions. Negative results must be combined with clinical observations, patient history, and epidemiological information. The expected result is Negative. Fact Sheet for Patients: SugarRoll.be Fact Sheet for Healthcare Providers: https://www.woods-mathews.com/ This test is not yet approved or cleared by the [place] FDA and  has been authorized for detection and/or diagnosis of SARS-CoV-2 by FDA under an Emergency Use Authorization (EUA). This EUA will remain  in effect (meaning this test can be used) for the duration of the COVID-19 declaration under Section 56 4(b)(1) of the Act, 21 U.S.C. section 360bbb-3(b)(1), unless the authorization is terminated or revoked sooner. Performed at [facility], [place] [address] [place], [place] [address]   SARS CORONAVIRUS 2 (TAT 6-24 HRS) Nasopharyngeal Nasopharyngeal Swab      Status: None   Collection Time: 07/29/19 12:39 AM   Specimen: Nasopharyngeal Swab  Result Value Ref Range Status   SARS Coronavirus 2 NEGATIVE NEGATIVE Final    Comment: (NOTE) SARS-CoV-2 target nucleic acids are NOT DETECTED. The SARS-CoV-2 RNA is generally detectable in upper and lower respiratory specimens during the acute phase of infection. Negative results do not preclude SARS-CoV-2 infection, do not rule out co-infections with other pathogens, and should not be used as the sole basis for treatment or other patient management decisions. Negative results must be combined with clinical observations, patient history, and epidemiological information. The expected result is Negative. Fact Sheet for Patients: SugarRoll.be Fact Sheet for Healthcare Providers: https://www.woods-mathews.com/ This test is not yet approved or cleared by the [place] FDA and  has been authorized for detection and/or diagnosis of SARS-CoV-2 by FDA under an Emergency Use Authorization (EUA). This EUA will remain  in effect (meaning this test can be used) for the duration of the COVID-19 declaration under Section 56 4(b)(1) of the Act, 21 U.S.C. section 360bbb-3(b)(1), unless the authorization is terminated or revoked sooner. Performed at [facility], [place] [address], [place], [place] [address]      Labs: Basic Metabolic Panel: Recent Labs  Lab 07/24/19 1349 07/24/19 1510 07/25/19 0533 07/26/19 0641 07/27/19 0355 07/28/19 0332  NA 137  --  138 137 139 138  K 4.0  --  3.5 3.1* 3.9 3.9  CL 104  --  109 110 113* 109  CO2 21*  --  21* 19* 19* 20*  GLUCOSE 125*  --  103* 103* 106* 87  BUN 13  --  13 13 9 11   CREATININE 1.00  --  0.91 0.91 0.76 0.94  CALCIUM 8.8*  --  8.1* 7.7* 8.0* 8.3*  MG  --  1.8  --  1.5*  --  1.8  PHOS  --  3.7  --   --   --   --    Liver Function Tests: Recent Labs  Lab 07/24/19 1510  AST 24  ALT 14  ALKPHOS 96   BILITOT 0.7  PROT 6.3*  ALBUMIN 2.5*   No results for input(s): LIPASE, AMYLASE in the last 168 hours. No results for input(s): AMMONIA in the last 168 hours. CBC: Recent Labs  Lab 07/24/19 1349 07/24/19 1610 07/25/19 0533 07/26/19 0641 07/27/19 0355 07/28/19 0332  WBC 10.3  --  7.8 7.0 8.3 8.2  NEUTROABS  --   --   --  4.6  --   --   HGB 12.3*  --  10.9* 9.9* 10.0* 10.8*  HCT 37.2* 33.2* 33.2* 30.0* 30.7* 33.3*  MCV 106.6*  --  105.4* 104.2* 106.2* 106.1*  PLT 242  --  220 190 221 230   Cardiac Enzymes: No results for input(s): CKTOTAL, CKMB, CKMBINDEX, TROPONINI in the last 168 hours. BNP: BNP (last 3 results) No results for input(s): BNP in the last 8760 hours.  ProBNP (last 3 results) No results for input(s): PROBNP in the last 8760 hours.  CBG: No results for input(s): GLUCAP in the last 168 hours.     Signed:  Nita Sells MD   [facility] 07/29/2019, 8:42 AM

## 2019-07-29 NOTE — TOC Progression Note (Signed)
Transition of Care [facility]) - Progression Note    Patient Details  Name: Tony [name] MRN: [id number] Date of Birth: 1940/10/13  Transition of Care [facility] CM/SW [place], [place] Phone Number: [phone] 07/29/2019, 9:03 AM  Clinical Narrative:     CSW called [employer] insurance who reports patient's insurance is still pending. The confirm receiving faxed clinicals yesterday 11/4 and anticipate auth to be achieved sometime today.   Expected Discharge Plan: [place] Barriers to Discharge: Continued Medical Work up  Expected Discharge Plan and Services Expected Discharge Plan: [place] Choice: [place] arrangements for the past 2 months: Single Family Home Expected Discharge Date: 07/29/19                                     Social Determinants of Health (SDOH) Interventions    Readmission Risk Interventions No flowsheet data found.

## 2019-07-30 ENCOUNTER — Other Ambulatory Visit: Payer: Self-pay | Admitting: Medical

## 2019-07-30 ENCOUNTER — Telehealth: Payer: Self-pay | Admitting: *Deleted

## 2019-07-30 DIAGNOSIS — R55 Syncope and collapse: Secondary | ICD-10-CM

## 2019-07-30 NOTE — Progress Notes (Signed)
Cardiology asked to arrange a cardiac event monitor for pre-syncope. Orders placed and message sent to Mack Guise to help arrange.   Abigail Butts, PA-C 07/30/19; 2:55 PM

## 2019-07-30 NOTE — Telephone Encounter (Signed)
Wife called to say Tony [name] is in rehab and is very weak. He suffered a bad fall.  She will call us next week with an update and see about rescheduling chemo at a future day

## 2019-08-05 ENCOUNTER — Telehealth: Payer: Self-pay | Admitting: Radiation Therapy

## 2019-08-05 ENCOUNTER — Other Ambulatory Visit: Payer: Self-pay | Admitting: Radiation Therapy

## 2019-08-05 DIAGNOSIS — C7949 Secondary malignant neoplasm of other parts of nervous system: Secondary | ICD-10-CM

## 2019-08-05 DIAGNOSIS — C7931 Secondary malignant neoplasm of brain: Secondary | ICD-10-CM

## 2019-08-05 NOTE — Telephone Encounter (Signed)
Spoke with pt's wife about his upcoming brain MRI and follow-up visit in December. She requested that the scan be moved to [facility] rather than GI. A new order was entered and scan rescheduled.  Wife is aware that Dr. Pearlie Oyster nurse will reach out to them closer to his follow-up visit to let them know if this will be an in person or telephone follow-up. She was thankful for the call and has everything written down.   MRI 12/17 - Check in at 11:30 for 12:00 scan at [facility]  Follow-up with Dr. Isidore Moos 12/17 @ 2:20   Mont Dutton R.T.(R)(T) Radiation Special Procedures Navigator

## 2019-08-11 ENCOUNTER — Telehealth: Payer: Self-pay | Admitting: *Deleted

## 2019-08-11 NOTE — Telephone Encounter (Signed)
Directed to ship [employer] 30 day cardiac event monitor to [place] Nurses attn. [place] [facility] [address], [place], [place] [address] [phone]

## 2019-08-13 ENCOUNTER — Telehealth: Payer: Self-pay

## 2019-08-13 NOTE — Telephone Encounter (Signed)
Tony [name] called to let us know Tony [name] fell  the end of October was hospitalized for 5 days and then to SNF for 2 weeks. Pt to be d/c tomorrow from SNF.  Pt wants to postpone treatment scheduled for Monday 11/23, until after thanksgiving.

## 2019-08-13 NOTE — Telephone Encounter (Signed)
Ok. Please reschedule.

## 2019-08-13 NOTE — Telephone Encounter (Signed)
I spoke with Tony [name] and let her know Tony [name] appts for 11/23 and 12/14 will be canceled and new appointments will be made for 11/30 and 12/21.  She verbalized understanding.

## 2019-08-16 ENCOUNTER — Inpatient Hospital Stay: Payer: Medicare Other

## 2019-08-16 ENCOUNTER — Ambulatory Visit: Payer: Medicare Other | Admitting: Nurse Practitioner

## 2019-08-16 ENCOUNTER — Ambulatory Visit: Payer: Medicare Other

## 2019-08-16 ENCOUNTER — Inpatient Hospital Stay: Payer: Medicare Other | Admitting: Nutrition

## 2019-08-16 ENCOUNTER — Inpatient Hospital Stay: Payer: Medicare Other | Admitting: Physician Assistant

## 2019-08-16 ENCOUNTER — Other Ambulatory Visit: Payer: Medicare Other

## 2019-08-17 ENCOUNTER — Telehealth: Payer: Self-pay | Admitting: Medical Oncology

## 2019-08-17 NOTE — Telephone Encounter (Signed)
Confirmed  appts for next week.

## 2019-08-18 ENCOUNTER — Telehealth: Payer: Self-pay | Admitting: Internal Medicine

## 2019-08-18 NOTE — Telephone Encounter (Signed)
Scheduled appt per 11/20 sch message - pt is aware of appt.

## 2019-08-23 ENCOUNTER — Other Ambulatory Visit: Payer: Self-pay

## 2019-08-23 ENCOUNTER — Ambulatory Visit: Payer: Medicare Other | Admitting: Physician Assistant

## 2019-08-23 ENCOUNTER — Other Ambulatory Visit: Payer: Medicare Other

## 2019-08-23 ENCOUNTER — Inpatient Hospital Stay (HOSPITAL_BASED_OUTPATIENT_CLINIC_OR_DEPARTMENT_OTHER): Payer: Medicare Other | Admitting: Physician Assistant

## 2019-08-23 ENCOUNTER — Inpatient Hospital Stay: Payer: Medicare Other

## 2019-08-23 ENCOUNTER — Inpatient Hospital Stay: Payer: Medicare Other | Attending: Oncology

## 2019-08-23 VITALS — BP 142/75 | HR 75 | Temp 97.8°F | Resp 17 | Ht 69.0 in | Wt 164.3 lb

## 2019-08-23 DIAGNOSIS — C7951 Secondary malignant neoplasm of bone: Secondary | ICD-10-CM | POA: Insufficient documentation

## 2019-08-23 DIAGNOSIS — D649 Anemia, unspecified: Secondary | ICD-10-CM

## 2019-08-23 DIAGNOSIS — Z5112 Encounter for antineoplastic immunotherapy: Secondary | ICD-10-CM | POA: Diagnosis not present

## 2019-08-23 DIAGNOSIS — C3491 Malignant neoplasm of unspecified part of right bronchus or lung: Secondary | ICD-10-CM

## 2019-08-23 DIAGNOSIS — Z5111 Encounter for antineoplastic chemotherapy: Secondary | ICD-10-CM

## 2019-08-23 DIAGNOSIS — C3431 Malignant neoplasm of lower lobe, right bronchus or lung: Secondary | ICD-10-CM | POA: Diagnosis present

## 2019-08-23 DIAGNOSIS — C7970 Secondary malignant neoplasm of unspecified adrenal gland: Secondary | ICD-10-CM | POA: Diagnosis not present

## 2019-08-23 DIAGNOSIS — Z79899 Other long term (current) drug therapy: Secondary | ICD-10-CM | POA: Insufficient documentation

## 2019-08-23 DIAGNOSIS — Z95828 Presence of other vascular implants and grafts: Secondary | ICD-10-CM

## 2019-08-23 LAB — CBC WITH DIFFERENTIAL (CANCER CENTER ONLY)
Abs Immature Granulocytes: 0.01 10*3/uL (ref 0.00–0.07)
Basophils Absolute: 0 10*3/uL (ref 0.0–0.1)
Basophils Relative: 1 %
Eosinophils Absolute: 0.2 10*3/uL (ref 0.0–0.5)
Eosinophils Relative: 3 %
HCT: 37.4 % — ABNORMAL LOW (ref 39.0–52.0)
Hemoglobin: 11.6 g/dL — ABNORMAL LOW (ref 13.0–17.0)
Immature Granulocytes: 0 %
Lymphocytes Relative: 17 %
Lymphs Abs: 1 10*3/uL (ref 0.7–4.0)
MCH: 32.4 pg (ref 26.0–34.0)
MCHC: 31 g/dL (ref 30.0–36.0)
MCV: 104.5 fL — ABNORMAL HIGH (ref 80.0–100.0)
Monocytes Absolute: 0.5 10*3/uL (ref 0.1–1.0)
Monocytes Relative: 8 %
Neutro Abs: 4.5 10*3/uL (ref 1.7–7.7)
Neutrophils Relative %: 71 %
Platelet Count: 212 10*3/uL (ref 150–400)
RBC: 3.58 MIL/uL — ABNORMAL LOW (ref 4.22–5.81)
RDW: 14.8 % (ref 11.5–15.5)
WBC Count: 6.3 10*3/uL (ref 4.0–10.5)
nRBC: 0 % (ref 0.0–0.2)

## 2019-08-23 LAB — CMP (CANCER CENTER ONLY)
ALT: 8 U/L (ref 0–44)
AST: 19 U/L (ref 15–41)
Albumin: 2.5 g/dL — ABNORMAL LOW (ref 3.5–5.0)
Alkaline Phosphatase: 84 U/L (ref 38–126)
Anion gap: 9 (ref 5–15)
BUN: 7 mg/dL — ABNORMAL LOW (ref 8–23)
CO2: 27 mmol/L (ref 22–32)
Calcium: 8.5 mg/dL — ABNORMAL LOW (ref 8.9–10.3)
Chloride: 105 mmol/L (ref 98–111)
Creatinine: 0.8 mg/dL (ref 0.61–1.24)
GFR, Est AFR Am: >60 mL/min (ref >60)
GFR, Estimated: >60 mL/min (ref >60)
Glucose, Bld: 117 mg/dL — ABNORMAL HIGH (ref 70–99)
Potassium: 4.2 mmol/L (ref 3.5–5.1)
Sodium: 141 mmol/L (ref 135–145)
Total Bilirubin: 0.5 mg/dL (ref 0.3–1.2)
Total Protein: 6.5 g/dL (ref 6.5–8.1)

## 2019-08-23 LAB — TSH: TSH: 1.313 u[IU]/mL (ref 0.320–4.118)

## 2019-08-23 MED ORDER — PROCHLORPERAZINE MALEATE 10 MG PO TABS
10.0000 mg | ORAL_TABLET | Freq: Once | ORAL | Status: AC
  Administered 2019-08-23: 10 mg via ORAL

## 2019-08-23 MED ORDER — SODIUM CHLORIDE 0.9 % IV SOLN
200.0000 mg | Freq: Once | INTRAVENOUS | Status: AC
  Administered 2019-08-23: 200 mg via INTRAVENOUS
  Filled 2019-08-23: qty 8

## 2019-08-23 MED ORDER — SODIUM CHLORIDE 0.9 % IV SOLN
410.0000 mg/m2 | Freq: Once | INTRAVENOUS | Status: AC
  Administered 2019-08-23: 800 mg via INTRAVENOUS
  Filled 2019-08-23: qty 20

## 2019-08-23 MED ORDER — SODIUM CHLORIDE 0.9% FLUSH
10.0000 mL | Freq: Once | INTRAVENOUS | Status: AC
  Administered 2019-08-23: 10 mL
  Filled 2019-08-23: qty 10

## 2019-08-23 MED ORDER — SODIUM CHLORIDE 0.9% FLUSH
10.0000 mL | INTRAVENOUS | Status: DC | PRN
  Administered 2019-08-23: 10 mL
  Filled 2019-08-23: qty 10

## 2019-08-23 MED ORDER — HEPARIN SOD (PORK) LOCK FLUSH 100 UNIT/ML IV SOLN
500.0000 [IU] | Freq: Once | INTRAVENOUS | Status: AC | PRN
  Administered 2019-08-23: 500 [IU]
  Filled 2019-08-23: qty 5

## 2019-08-23 MED ORDER — CYANOCOBALAMIN 1000 MCG/ML IJ SOLN
1000.0000 ug | Freq: Once | INTRAMUSCULAR | Status: AC
  Administered 2019-08-23: 1000 ug via INTRAMUSCULAR

## 2019-08-23 MED ORDER — SODIUM CHLORIDE 0.9 % IV SOLN
Freq: Once | INTRAVENOUS | Status: AC
  Administered 2019-08-23: 14:00:00 via INTRAVENOUS
  Filled 2019-08-23: qty 250

## 2019-08-23 MED ORDER — CYANOCOBALAMIN 1000 MCG/ML IJ SOLN
INTRAMUSCULAR | Status: AC
  Filled 2019-08-23: qty 1

## 2019-08-23 NOTE — Patient Instructions (Signed)

## 2019-08-23 NOTE — Progress Notes (Signed)
[facility] OFFICE PROGRESS NOTE  [name], MD [place] [address]  DIAGNOSIS: Stage IV non-small cell lung cancer, adenocarcinoma who presented with a large right lower lobe lung mass in addition to metastatic disease to the mediastinal lymph nodes, adrenal glands, and bone involving the right shoulder,lumbar spines, and pelvic bone. Molecular Studies by [facility] [employer]:Showed no actionable mutations.  PRIOR THERAPY:  1)palliative radiation to the right neck and shoulder. 2)SRS to the brain lesions on 09/01/2018.  CURRENT THERAPY: palliative systemic chemotherapy with carboplatin for an AUC of 5, Alimta 400 mg/m, and Keytruda 200 mg IV every 3 weeks. First dose September 30, 2018.  Status post 11 cycles.  Starting from cycle #5 the patient will be treated with maintenance Alimta and Keytruda. Treatment has been on hold from 04/27/2019-08/23/2019 secondary to fatigue and weakness.   INTERVAL HISTORY: Tony [name] 78 y.o. male returns to the clinic for a follow up visit accompanied by his wife. The patient was recently hospitalized for syncope and new onset Afib. He does not wish to pursue anticoagulation due to a history of a GI bleed. He had been in rehab recently and is home at this time. His treatment with Keytruda and Alimta had been on hold for 2 month due to increasing fatigue and frequent blood transfusions. His last treatment was on 04/27/2019. The patient had a restaging CT scan on 07/16/2019 and was planning to restart chemotherapy on 07/26/2019; however, this was delayed until today due to his hospitalization.  Today, he is feeling a little bit better since being in rehab. He denies any fever, chills, night sweats, or weight loss. Denies any chest pain or hemoptysis. He reports his baseline cough and dyspnea on exertion. Denies any nausea, vomiting, or diarrhea. He reports his baseline constipation for which he take miralax. Denies any  headache or visual changes. Denies any rashes or skin changes. The patient is here today for evaluation prior resuming chemotherapy with cycle #12 today.    MEDICAL HISTORY: Past Medical History:  Diagnosis Date  . Anemia   . Brain metastases ([place])   . CAD (coronary artery disease)   . 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 Constipation   . Gout   . Heart disease   . History of blood transfusion   . History of radiation therapy 09/01/2018   brain, cerebella (5 sites)/ 20 Gy in 1 fraction.  Marland Kitchen HLD (hyperlipidemia) 10/13/2018  . HTN (hypertension) 10/13/2018  . Hyperlipidemia   . Hypertension   . Lung cancer ([place]) dx'd 07/2018  . Mass of lower lobe of right lung   . Myocardial infarction ([place])    MI in 2000  . Peripheral vascular disease (HCC)    Abdominal Aortic Aneurysm  . Prostate cancer [facility]) Nov. 2012  . Ulcer     ALLERGIES:  has No Known Allergies.  MEDICATIONS:  Current Outpatient Medications  Medication Sig Dispense Refill  . aspirin EC 81 MG tablet Take 81 mg by mouth daily after supper.    . colchicine 0.6 MG tablet Take 0.6 mg by mouth daily as needed (gout attacks).     . folic acid (FOLVITE) 267 MCG tablet Take 800 mcg by mouth daily before breakfast.     . gabapentin (NEURONTIN) 100 MG capsule TAKE 2 CAPSULES BY MOUTH AT BEDTIME (Patient taking differently: Take 200 mg by mouth at bedtime. ) 180 capsule 1  . metoprolol tartrate (LOPRESSOR) 25 MG tablet  Take 0.5 tablets (12.5 mg total) by mouth 2 (two) times daily.    . Multiple Vitamins-Minerals (MULTIVITAMIN WITH MINERALS) tablet Take 1 tablet by mouth daily before breakfast.     . niacin 500 MG tablet Take 500 mg by mouth 2 (two) times daily.    . Omega-3 Fatty Acids (FISH OIL) 1200 MG CAPS Take 1,200 mg by mouth daily before breakfast.     . omeprazole (PRILOSEC OTC) 20 MG tablet Take 20 mg by mouth daily as needed (acid reflux).     . polyethylene glycol (MIRALAX / GLYCOLAX) packet Take 17 g by mouth  daily as needed for mild constipation.     . pravastatin (PRAVACHOL) 10 MG tablet Take 10 mg by mouth daily after supper.      No current facility-administered medications for this visit.    Facility-Administered Medications Ordered in Other Visits  Medication Dose Route Frequency Provider Last Rate Last Dose  . sodium chloride flush (NS) 0.9 % injection 10 mL  10 mL Intracatheter PRN Curt Bears, MD   10 mL at 08/23/19 1558    SURGICAL HISTORY:  Past Surgical History:  Procedure Laterality Date  . ANAL FISSURECTOMY    . COLONOSCOPY    . CORONARY ARTERY BYPASS GRAFT     2000  . EYE SURGERY Left Nov. 2013   Cataract  . EYE SURGERY Right Feb. 2014   Cataract  . HERNIA REPAIR    . PORTACATH PLACEMENT Left 09/24/2018   Procedure: INSERTION PORT-A-CATH;  Surgeon: Melrose Nakayama, MD;  Location: [facility];  Service: Thoracic;  Laterality: Left;  . PR VEIN BYPASS GRAFT,AORTO-FEM-POP  2000  . PROSTATE SURGERY  Nov. 2012  . ROBOT ASSISTED LAPAROSCOPIC RADICAL PROSTATECTOMY  08/08/2011   Procedure: ROBOTIC ASSISTED LAPAROSCOPIC RADICAL PROSTATECTOMY LEVEL 2;  Surgeon: Dutch Gray, MD;  Location: [facility];  Service: Urology;  Laterality: Bilateral;  with Bilateral Pelvic Lymphadenectomy   . SHOULDER SURGERY  1980'S  . VIDEO BRONCHOSCOPY WITH ENDOBRONCHIAL ULTRASOUND N/A 08/06/2018   Procedure: VIDEO BRONCHOSCOPY WITH ENDOBRONCHIAL ULTRASOUND;  Surgeon: Melrose Nakayama, MD;  Location: [facility];  Service: Thoracic;  Laterality: N/A;  . WISDOM TOOTH EXTRACTION      REVIEW OF SYSTEMS:   Review of Systems  Constitutional: Negative for appetite change, chills, fatigue, fever and unexpected weight change.  HENT: Negative for mouth sores, nosebleeds, sore throat and trouble swallowing.   Eyes: Negative for eye problems and icterus.  Respiratory: Positive for baseline dyspnea on exertion and baseline cough. Negative for hemoptysis and wheezing.   Cardiovascular: Negative for chest pain and  leg swelling.  Gastrointestinal: Positive for occassional constipation. Negative for abdominal pain, diarrhea, nausea and vomiting.  Genitourinary: Negative for bladder incontinence, difficulty urinating, dysuria, frequency and hematuria.   Musculoskeletal: Negative for back pain, gait problem, neck pain and neck stiffness.  Skin: Negative for itching and rash.  Neurological: Negative for dizziness, extremity weakness, gait problem, headaches, light-headedness and seizures.  Hematological: Negative for adenopathy. Does not bruise/bleed easily.  Psychiatric/Behavioral: Negative for confusion, depression and sleep disturbance. The patient is not nervous/anxious.     PHYSICAL EXAMINATION:  Blood pressure (!) 142/75, pulse 75, temperature 97.8 F (36.6 C), temperature source Temporal, resp. rate 17, height 5\' 9"  (1.753 m), weight 164 lb 4.8 oz (74.5 kg), SpO2 97 %.  ECOG PERFORMANCE STATUS: 1 - Symptomatic but completely ambulatory  Physical Exam  Constitutional: Oriented to person, place, and time and well-developed, well-nourished, and in no distress.  HENT:  Head: Normocephalic and atraumatic.  Mouth/Throat: Oropharynx is clear and moist. No oropharyngeal exudate.  Eyes: Conjunctivae are normal. Right eye exhibits no discharge. Left eye exhibits no discharge. No scleral icterus.  Neck: Normal range of motion. Neck supple.  Cardiovascular: Normal rate, regular rhythm, normal heart sounds and intact distal pulses.   Pulmonary/Chest: Effort normal. Decreased breath sounds in RLL. No respiratory distress. No wheezes. No rales.  Abdominal: Soft. Bowel sounds are normal. Exhibits no distension and no mass. There is no tenderness.  Musculoskeletal: Normal range of motion. Exhibits no edema.  Lymphadenopathy:    No cervical adenopathy.  Neurological: Alert and oriented to person, place, and time. Exhibits normal muscle tone. Gait normal. Coordination normal.  Skin: Skin is warm and dry. No rash  noted. Not diaphoretic. No erythema. No pallor.  Psychiatric: Mood, memory and judgment normal.  Vitals reviewed.  LABORATORY DATA: Lab Results  Component Value Date   WBC 6.3 08/23/2019   HGB 11.6 (L) 08/23/2019   HCT 37.4 (L) 08/23/2019   MCV 104.5 (H) 08/23/2019   PLT 212 08/23/2019      Chemistry      Component Value Date/Time   NA 141 08/23/2019 1235   NA 139 08/10/2012 1328   K 4.2 08/23/2019 1235   K 4.1 08/10/2012 1328   CL 105 08/23/2019 1235   CL 105 08/10/2012 1328   CO2 27 08/23/2019 1235   CO2 29 08/10/2012 1328   BUN 7 (L) 08/23/2019 1235   BUN 18.0 08/10/2012 1328   CREATININE 0.80 08/23/2019 1235   CREATININE 1.1 08/10/2012 1328      Component Value Date/Time   CALCIUM 8.5 (L) 08/23/2019 1235   CALCIUM 9.0 08/10/2012 1328   ALKPHOS 84 08/23/2019 1235   ALKPHOS 144 08/10/2012 1328   AST 19 08/23/2019 1235   AST 42 (H) 08/10/2012 1328   ALT 8 08/23/2019 1235   ALT 55 08/10/2012 1328   BILITOT 0.5 08/23/2019 1235   BILITOT 0.91 08/10/2012 1328       RADIOGRAPHIC STUDIES:  No results found.   ASSESSMENT/PLAN:  This is a very pleasant 78 years old white male with a stage IV non-small cell lung cancer, adenocarcinoma presented with large right lower lobe lung mass in addition to mediastinal lymphadenopathy, metastatic disease to the adrenal glands as well as several metastatic bone lesions including the right shoulder, lumbar spine and pelvic bones.  The patient has no actionable mutations. He underwent palliative radiotherapy to the right shoulder and painful metastatic bone lesions.  He is currently undergoing systemic chemotherapy tomorrow with carboplatin for AUC of 5, Alimta 500 mg/M2 and Keytruda 200 mg IV every 3 weeks.  Status post 4 cycles. He is currently on maintenance treatment with Alimta and Keytruda status post 7 cycles.  The patient has been on observation the last 3 months taking a break of chemotherapy.  Is feeling a little bit better  but unfortunately his repeat imaging studies showed evidence for disease progression especially in the lung. He was planning to resume treatment with chemotherapy in early November 2020 but this was delayed secondary to a hospitalization for syncope and new onset atrial fibrillation. The patient is feeling better since returning home from rehab.   Labs were reviewed with the patient. I recommend for him to resume his treatment with cycle #12 today as scheduled.   We will see him back for a follow up visit in 3 weeks for evaluation before starting cycle #13.   The patient  was advised to call immediately if he has any concerning symptoms in the interval. The patient voices understanding of current disease status and treatment options and is in agreement with the current care plan. All questions were answered. The patient knows to call the clinic with any problems, questions or concerns. We can certainly see the patient much sooner if necessary  No orders of the defined types were placed in this encounter.    Cassandra L Heilingoetter, PA-C 08/23/19

## 2019-08-23 NOTE — Patient Instructions (Signed)
[facility] Discharge Instructions for Patients Receiving Chemotherapy  Today you received the following chemotherapy agents Pembrolizumab (KEYTRUDA) & Pemetrexed (ALIMTA).  To help prevent nausea and vomiting after your treatment, we encourage you to take your nausea medication as prescribed.   If you develop nausea and vomiting that is not controlled by your nausea medication, call the clinic.   BELOW ARE SYMPTOMS THAT SHOULD BE REPORTED IMMEDIATELY:  *FEVER GREATER THAN 100.5 F  *CHILLS WITH OR WITHOUT FEVER  NAUSEA AND VOMITING THAT IS NOT CONTROLLED WITH YOUR NAUSEA MEDICATION  *UNUSUAL SHORTNESS OF BREATH  *UNUSUAL BRUISING OR BLEEDING  TENDERNESS IN MOUTH AND THROAT WITH OR WITHOUT PRESENCE OF ULCERS  *URINARY PROBLEMS  *BOWEL PROBLEMS  UNUSUAL RASH Items with * indicate a potential emergency and should be followed up as soon as possible.  Feel free to call the clinic should you have any questions or concerns. The clinic phone number is [phone].  Please show the [facility] at check-in to the Emergency Department and triage nurse.  Coronavirus (COVID-19) Are you at risk?  Are you at risk for the Coronavirus (COVID-19)?  To be considered HIGH RISK for Coronavirus (COVID-19), you have to meet the following criteria:  . Traveled to [place], [place], [place], [place] or [place]; or in the [place] to [place], [place], [place], or [place]; and have fever, cough, and shortness of breath within the last 2 weeks of travel OR . Been in close contact with a person diagnosed with COVID-19 within the last 2 weeks and have fever, cough, and shortness of breath . IF YOU DO NOT MEET THESE CRITERIA, YOU ARE CONSIDERED LOW RISK FOR COVID-19.  What to do if you are HIGH RISK for COVID-19?  Marland Kitchen If you are having a medical emergency, call 911. . Seek medical care right away. Before you go to a doctor's office, urgent care or emergency department,  call ahead and tell them about your recent travel, contact with someone diagnosed with COVID-19, and your symptoms. You should receive instructions from your physician's office regarding next steps of care.  . When you arrive at healthcare provider, tell the healthcare staff immediately you have returned from visiting [place], [place], [place], [place] or [place]; or traveled in the [place] to [place], [place], [place], or [place]; in the last two weeks or you have been in close contact with a person diagnosed with COVID-19 in the last 2 weeks.   . Tell the health care staff about your symptoms: fever, cough and shortness of breath. . After you have been seen by a medical provider, you will be either: o Tested for (COVID-19) and discharged home on quarantine except to seek medical care if symptoms worsen, and asked to  - Stay home and avoid contact with others until you get your results (4-5 days)  - Avoid travel on public transportation if possible (such as bus, train, or airplane) or o Sent to the Emergency Department by EMS for evaluation, COVID-19 testing, and possible admission depending on your condition and test results.  What to do if you are LOW RISK for COVID-19?  Reduce your risk of any infection by using the same precautions used for avoiding the common cold or flu:  Marland Kitchen Wash your hands often with soap and warm water for at least 20 seconds.  If soap and water are not readily available, use an alcohol-based hand sanitizer with at least 60% alcohol.  Marland Kitchen  If coughing or sneezing, cover your mouth and nose by coughing or sneezing into the elbow areas of your shirt or coat, into a tissue or into your sleeve (not your hands). . Avoid shaking hands with others and consider head nods or verbal greetings only. . Avoid touching your eyes, nose, or mouth with unwashed hands.  . Avoid close contact with people who are sick. . Avoid places or events with large numbers of people in one  location, like concerts or sporting events. . Carefully consider travel plans you have or are making. . If you are planning any travel outside or inside the [place], visit the CDC's Travelers' Health webpage for the latest health notices. . If you have some symptoms but not all symptoms, continue to monitor at home and seek medical attention if your symptoms worsen. . If you are having a medical emergency, call 911.   [facility] / e-Visit: [email or web address]         [facility]: [facility]: [phone]                   [facility]: [phone]

## 2019-08-24 ENCOUNTER — Telehealth: Payer: Self-pay | Admitting: Physician Assistant

## 2019-08-24 NOTE — Telephone Encounter (Signed)
Scheduled per los. Called and left msg. Mailed printout  °

## 2019-08-30 ENCOUNTER — Telehealth: Payer: Self-pay

## 2019-08-30 NOTE — Telephone Encounter (Signed)
Mr. Dauria left vm stating he was very fatigued.  I returned his call.  His fatigued and has a poor appetite.  I encouraged him to eat small frequent snacks throughout the day.  I told him to focus on prt while his appetite is poor.  I encouraged him to supplement with ensure/boost up to 3 cans a day.  I encouraged him to rest in between activities.  He is afebrile, he denies dyspnea.  He and His wife verbalized understanding.

## 2019-09-01 ENCOUNTER — Other Ambulatory Visit: Payer: Self-pay

## 2019-09-01 ENCOUNTER — Telehealth: Payer: Self-pay

## 2019-09-01 ENCOUNTER — Inpatient Hospital Stay: Payer: Medicare Other | Attending: Oncology | Admitting: Medical

## 2019-09-01 ENCOUNTER — Inpatient Hospital Stay: Payer: Medicare Other

## 2019-09-01 VITALS — BP 123/62 | HR 75 | Temp 97.9°F | Resp 18 | Ht 69.0 in

## 2019-09-01 DIAGNOSIS — C7931 Secondary malignant neoplasm of brain: Secondary | ICD-10-CM

## 2019-09-01 DIAGNOSIS — R0609 Other forms of dyspnea: Secondary | ICD-10-CM | POA: Insufficient documentation

## 2019-09-01 DIAGNOSIS — Z95828 Presence of other vascular implants and grafts: Secondary | ICD-10-CM

## 2019-09-01 DIAGNOSIS — C3431 Malignant neoplasm of lower lobe, right bronchus or lung: Secondary | ICD-10-CM | POA: Insufficient documentation

## 2019-09-01 DIAGNOSIS — C7951 Secondary malignant neoplasm of bone: Secondary | ICD-10-CM | POA: Diagnosis not present

## 2019-09-01 DIAGNOSIS — K5900 Constipation, unspecified: Secondary | ICD-10-CM | POA: Insufficient documentation

## 2019-09-01 DIAGNOSIS — C3491 Malignant neoplasm of unspecified part of right bronchus or lung: Secondary | ICD-10-CM | POA: Diagnosis not present

## 2019-09-01 DIAGNOSIS — R630 Anorexia: Secondary | ICD-10-CM | POA: Insufficient documentation

## 2019-09-01 DIAGNOSIS — D649 Anemia, unspecified: Secondary | ICD-10-CM

## 2019-09-01 DIAGNOSIS — R0602 Shortness of breath: Secondary | ICD-10-CM

## 2019-09-01 DIAGNOSIS — E860 Dehydration: Secondary | ICD-10-CM

## 2019-09-01 DIAGNOSIS — K5909 Other constipation: Secondary | ICD-10-CM

## 2019-09-01 DIAGNOSIS — D6481 Anemia due to antineoplastic chemotherapy: Secondary | ICD-10-CM

## 2019-09-01 DIAGNOSIS — T451X5A Adverse effect of antineoplastic and immunosuppressive drugs, initial encounter: Secondary | ICD-10-CM

## 2019-09-01 DIAGNOSIS — Z5189 Encounter for other specified aftercare: Secondary | ICD-10-CM | POA: Insufficient documentation

## 2019-09-01 LAB — CBC WITH DIFFERENTIAL (CANCER CENTER ONLY)
Abs Immature Granulocytes: 0.02 10*3/uL (ref 0.00–0.07)
Basophils Absolute: 0 10*3/uL (ref 0.0–0.1)
Basophils Relative: 1 %
Eosinophils Absolute: 0 10*3/uL (ref 0.0–0.5)
Eosinophils Relative: 1 %
HCT: 35.3 % — ABNORMAL LOW (ref 39.0–52.0)
Hemoglobin: 10.9 g/dL — ABNORMAL LOW (ref 13.0–17.0)
Immature Granulocytes: 1 %
Lymphocytes Relative: 29 %
Lymphs Abs: 0.6 10*3/uL — ABNORMAL LOW (ref 0.7–4.0)
MCH: 31.9 pg (ref 26.0–34.0)
MCHC: 30.9 g/dL (ref 30.0–36.0)
MCV: 103.2 fL — ABNORMAL HIGH (ref 80.0–100.0)
Monocytes Absolute: 0.2 10*3/uL (ref 0.1–1.0)
Monocytes Relative: 11 %
Neutro Abs: 1.3 10*3/uL — ABNORMAL LOW (ref 1.7–7.7)
Neutrophils Relative %: 57 %
Platelet Count: 67 10*3/uL — ABNORMAL LOW (ref 150–400)
RBC: 3.42 MIL/uL — ABNORMAL LOW (ref 4.22–5.81)
RDW: 13.9 % (ref 11.5–15.5)
WBC Count: 2.2 10*3/uL — ABNORMAL LOW (ref 4.0–10.5)
nRBC: 0 % (ref 0.0–0.2)

## 2019-09-01 LAB — CMP (CANCER CENTER ONLY)
ALT: 14 U/L (ref 0–44)
AST: 28 U/L (ref 15–41)
Albumin: 2.4 g/dL — ABNORMAL LOW (ref 3.5–5.0)
Alkaline Phosphatase: 73 U/L (ref 38–126)
Anion gap: 7 (ref 5–15)
BUN: 21 mg/dL (ref 8–23)
CO2: 32 mmol/L (ref 22–32)
Calcium: 9 mg/dL (ref 8.9–10.3)
Chloride: 103 mmol/L (ref 98–111)
Creatinine: 0.79 mg/dL (ref 0.61–1.24)
GFR, Est AFR Am: >60 mL/min (ref >60)
GFR, Estimated: >60 mL/min (ref >60)
Glucose, Bld: 140 mg/dL — ABNORMAL HIGH (ref 70–99)
Potassium: 4.3 mmol/L (ref 3.5–5.1)
Sodium: 142 mmol/L (ref 135–145)
Total Bilirubin: 0.4 mg/dL (ref 0.3–1.2)
Total Protein: 6.7 g/dL (ref 6.5–8.1)

## 2019-09-01 MED ORDER — SODIUM CHLORIDE 0.9 % IV SOLN
INTRAVENOUS | Status: DC
  Administered 2019-09-01: 15:00:00 via INTRAVENOUS
  Filled 2019-09-01 (×2): qty 250

## 2019-09-01 MED ORDER — SODIUM CHLORIDE 0.9% FLUSH
10.0000 mL | Freq: Once | INTRAVENOUS | Status: AC
  Administered 2019-09-01: 10 mL
  Filled 2019-09-01: qty 10

## 2019-09-01 MED ORDER — HEPARIN SOD (PORK) LOCK FLUSH 100 UNIT/ML IV SOLN
500.0000 [IU] | Freq: Once | INTRAVENOUS | Status: AC
  Administered 2019-09-01: 500 [IU]
  Filled 2019-09-01: qty 5

## 2019-09-01 NOTE — Patient Instructions (Addendum)

## 2019-09-01 NOTE — Progress Notes (Signed)
Pt received 1L IVF NS, tolerated well.  VSS.  Able to tolerate oral fluids during infusion, declined food.  D/c instructions provided to pt & his wife on constipation and for IVF appt on 09/02/2019, verbalized understanding.

## 2019-09-01 NOTE — Telephone Encounter (Signed)
Pt called stating he is not "feeling well". He is dyspneic at rest.  He has not had a BM since Sunday. He is taking miralax and colace.   Do you want him to be seen here or go to ED?

## 2019-09-01 NOTE — Progress Notes (Signed)
[facility]pt[facility] Management [facility] Progress Note   Tony [name] [id number] 1941-06-22 78 y.o.  Tony [name] is managed by Dr. Fanny Bien. 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 Macpherson is status post cycle #12 of maintenance Alimta and Keytruda which was dosed on 08/23/2019. His next treatment is scheduled for 09/13/2019.  Constipation: The patient was recommended to use Senna-S, 1 to 2 tablets twice daily as needed and MiraLAX 17 grams in 8 ounces of liquids 1 to 2 times daily as needed. He was given 1 liter of IV normal saline today. He will return tomorrow for additional IV fluid.  Dyspnea: The patient's oxygen saturation was noted to be 94% on room air. A CBC returned showing a hemoglobin of 10.9 and hematocrit of 35.3.  Please see After Visit Summary for patient specific instructions.  Future Appointments  Date Time Provider [place]  09/01/2019  1:00 PM [facility] LAB 2 [facility] None  09/01/2019  1:30 PM Sandi Mealy E., PA-C [facility] None  09/09/2019 12:00 PM [facility]MR 1 [facility]  09/13/2019  7:00 AM [facility]TUMOR BOARD CONFERENCE [facility] None  09/13/2019 12:00 PM [facility]MO LAB ONLY [facility] None  09/13/2019 12:15 PM [facility] [place] FLUSH [facility] None  09/13/2019  1:00 PM [facility] INFUSION [facility] None  09/13/2019  1:45 PM Karie Mainland, RD [facility] None  09/14/2019  2:20 PM Eppie Gibson, MD [facility] None  10/04/2019  8:15 AM [facility] LAB 6 [facility] None  10/04/2019  8:30 AM [facility] [place] FLUSH [facility] None  10/04/2019  9:00 AM  Heilingoetter, Cassandra L, PA-C [facility] None  10/04/2019 10:00 AM [facility] INFUSION [facility] None    No orders of the defined types were placed in this encounter.      Subjective:   Patient ID:  Tony [name] is a 78 y.o. (DOB 02/01/1941) male.  Chief Complaint: No chief complaint on file.   HPI Tony [name]  Is a 78 y.o. male with a diagnosis of a metastatic adenocarcinoma of the lung with brain and bone metastasis. He is managed by Dr. Julien Nordmann and is status post cycle #12 of maintenance Alimta and Keytruda which was dosed on 08/23/2019. Cycle #12 was given after a 3 month break period after the patient experienced a fall.  He has a history of constipation. He called our office this morning reporting he is not "feeling well". He is dyspneic at rest.  He has not had a BM since Sunday. He has been taking miralax and colace.  He also reports fatigue and anorexia.  Medications: I have reviewed the patient's current medications.  Allergies: No Known Allergies  Past Medical History:  Diagnosis Date  . Anemia   . Brain metastases ([place])   . CAD (coronary artery disease)   . 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 Constipation   . Gout   . Heart disease   . History of blood transfusion   . History of radiation therapy 09/01/2018   brain, cerebella (5 sites)/ 20 Gy in 1 fraction.  Marland Kitchen HLD (hyperlipidemia) 10/13/2018  . HTN (hypertension) 10/13/2018  . Hyperlipidemia   . Hypertension   .  Lung cancer ([place]) dx'd 07/2018  . Mass of lower lobe of right lung   . Myocardial infarction ([place])    MI in 2000  . Peripheral vascular disease ([facility])    Abdominal Aortic Aneurysm  . Prostate cancer [facility]) Nov. 2012  . Ulcer     Past Surgical History:  Procedure Laterality Date  . ANAL FISSURECTOMY    . COLONOSCOPY    . CORONARY ARTERY BYPASS GRAFT     2000  . EYE SURGERY Left Nov. 2013   Cataract  . EYE SURGERY Right Feb. 2014   Cataract  . HERNIA REPAIR    .  PORTACATH PLACEMENT Left 09/24/2018   Procedure: INSERTION PORT-A-CATH;  Surgeon: Melrose Nakayama, MD;  Location: [place];  Service: Thoracic;  Laterality: Left;  . PR VEIN BYPASS GRAFT,AORTO-FEM-POP  2000  . PROSTATE SURGERY  Nov. 2012  . ROBOT ASSISTED LAPAROSCOPIC RADICAL PROSTATECTOMY  08/08/2011   Procedure: ROBOTIC ASSISTED LAPAROSCOPIC RADICAL PROSTATECTOMY LEVEL 2;  Surgeon: Dutch Gray, MD;  Location: [facility];  Service: Urology;  Laterality: Bilateral;  with Bilateral Pelvic Lymphadenectomy   . SHOULDER SURGERY  1980'S  . VIDEO BRONCHOSCOPY WITH ENDOBRONCHIAL ULTRASOUND N/A 08/06/2018   Procedure: VIDEO BRONCHOSCOPY WITH ENDOBRONCHIAL ULTRASOUND;  Surgeon: Melrose Nakayama, MD;  Location[facility];  Service: Thoracic;  Laterality: N/A;  . WISDOM TOOTH EXTRACTION      Family History  Problem Relation Age of Onset  . Diabetes Mother   . Heart disease Mother        CHF  . Heart disease Father   . Heart failure Father        Amputation- Leg  . Deep vein thrombosis Father   . Heart disease Brother        Heart Disease before age 81  . Heart failure Brother   . Hyperlipidemia Brother   . Hypertension Brother   . Heart disease Sister   . Diabetes Sister   . Heart failure Sister     Social History   Socioeconomic History  . Marital status: Married    Spouse name: Not on file  . Number of children: Not on file  . Years of education: Not on file  . Highest education level: Not on file  Occupational History  . Not on file  Social Needs  . Financial resource strain: Not on file  . Food insecurity    Worry: Not on file    Inability: Not on file  . Transportation needs    Medical: Not on file    Non-medical: Not on file  Tobacco Use  . Smoking status: Former Smoker    Packs/day: 1.50    Years: 43.00    Pack years: 64.50    Types: Cigarettes    Quit date: 09/23/1998    Years since quitting: 20.9  . Smokeless tobacco: Never Used  Substance and Sexual Activity  .  Alcohol use: Yes    Alcohol/week: 7.0 standard drinks    Types: 7 Glasses of wine per week    Comment: half glass per day  . Drug use: No  . Sexual activity: Not on file  Lifestyle  . Physical activity    Days per week: Not on file    Minutes per session: Not on file  . Stress: Not on file  Relationships  . Social Herbalist on phone: Not on file    Gets together: Not on file    Attends religious service: Not  on file    Active member of club or organization: Not on file    Attends meetings of clubs or organizations: Not on file    Relationship status: Not on file  . Intimate partner violence    Fear of current or ex partner: Not on file    Emotionally abused: Not on file    Physically abused: Not on file    Forced sexual activity: Not on file  Other Topics Concern  . Not on file  Social History Narrative  . Not on file    Past Medical History, Surgical history, Social history, and Family history were reviewed and updated as appropriate.   Please see review of systems for further details on the patient's review from today.   Review of Systems:  Review of Systems  Constitutional: Positive for fatigue. Negative for appetite change, chills, diaphoresis, fever and unexpected weight change.  Respiratory: Positive for shortness of breath.   Gastrointestinal: Positive for constipation. Negative for abdominal distention, abdominal pain, anal bleeding, blood in stool, diarrhea, nausea, rectal pain and vomiting.    Objective:   Physical Exam:  There were no vitals taken for this visit. ECOG: 1  Physical Exam Constitutional:      General: He is not in acute distress.    Appearance: He is not diaphoretic.     Comments: The patient is a chronically ill appearing male who is in no acute distress.  HENT:     Head: Normocephalic and atraumatic.  Cardiovascular:     Rate and Rhythm: Normal rate and regular rhythm.     Heart sounds: Normal heart sounds. No murmur. No  friction rub. No gallop.   Pulmonary:     Effort: Pulmonary effort is normal. No respiratory distress.     Breath sounds: Normal breath sounds. No wheezing or rales.  Abdominal:     General: Bowel sounds are normal. There is no distension.     Tenderness: There is no abdominal tenderness. There is no guarding.  Skin:    General: Skin is warm and dry.     Findings: No erythema or rash.  Neurological:     Mental Status: He is alert.     Gait: Gait abnormal (The patient is ambulating with a wheelchair today.).     Lab Review:     Component Value Date/Time   NA 141 08/23/2019 1235   NA 139 08/10/2012 1328   K 4.2 08/23/2019 1235   K 4.1 08/10/2012 1328   CL 105 08/23/2019 1235   CL 105 08/10/2012 1328   CO2 27 08/23/2019 1235   CO2 29 08/10/2012 1328   GLUCOSE 117 (H) 08/23/2019 1235   GLUCOSE 112 (H) 08/10/2012 1328   BUN 7 (L) 08/23/2019 1235   BUN 18.0 08/10/2012 1328   CREATININE 0.80 08/23/2019 1235   CREATININE 1.1 08/10/2012 1328   CALCIUM 8.5 (L) 08/23/2019 1235   CALCIUM 9.0 08/10/2012 1328   PROT 6.5 08/23/2019 1235   PROT 6.1 (L) 08/10/2012 1328   ALBUMIN 2.5 (L) 08/23/2019 1235   ALBUMIN 3.2 (L) 08/10/2012 1328   AST 19 08/23/2019 1235   AST 42 (H) 08/10/2012 1328   ALT 8 08/23/2019 1235   ALT 55 08/10/2012 1328   ALKPHOS 84 08/23/2019 1235   ALKPHOS 144 08/10/2012 1328   BILITOT 0.5 08/23/2019 1235   BILITOT 0.91 08/10/2012 1328   GFRNONAA >60 08/23/2019 1235   GFRAA >60 08/23/2019 1235       Component Value Date/Time  WBC 6.3 08/23/2019 1235   WBC 8.2 07/28/2019 0332   RBC 3.58 (L) 08/23/2019 1235   HGB 11.6 (L) 08/23/2019 1235   HGB 14.7 05/11/2013 1235   HCT 37.4 (L) 08/23/2019 1235   HCT 33.2 (L) 07/24/2019 1610   HCT 42.5 05/11/2013 1235   PLT 212 08/23/2019 1235   PLT 131 (L) 05/11/2013 1235   MCV 104.5 (H) 08/23/2019 1235   MCV 100.7 (H) 05/11/2013 1235   MCH 32.4 08/23/2019 1235   MCHC 31.0 08/23/2019 1235   RDW 14.8 08/23/2019 1235    RDW 12.8 05/11/2013 1235   LYMPHSABS 1.0 08/23/2019 1235   LYMPHSABS 2.1 05/11/2013 1235   MONOABS 0.5 08/23/2019 1235   MONOABS 0.5 05/11/2013 1235   EOSABS 0.2 08/23/2019 1235   EOSABS 0.1 05/11/2013 1235   BASOSABS 0.0 08/23/2019 1235   BASOSABS 0.1 05/11/2013 1235   -------------------------------  Imaging from last 24 hours (if applicable):  Radiology interpretation: No results found.      This case was discussed with Dr. Julien Nordmann. He expressed agreement with my management of this patient.

## 2019-09-01 NOTE — Telephone Encounter (Signed)
Per Dr. Julien Nordmann Tony [name] to be evaluated in symptom management clinic.  That appt and lab appt made for today.  Tony and Tony [name] verbalized understanding.

## 2019-09-02 ENCOUNTER — Other Ambulatory Visit: Payer: Self-pay | Admitting: Medical

## 2019-09-02 ENCOUNTER — Inpatient Hospital Stay (HOSPITAL_BASED_OUTPATIENT_CLINIC_OR_DEPARTMENT_OTHER): Payer: Medicare Other | Admitting: Medical

## 2019-09-02 ENCOUNTER — Other Ambulatory Visit: Payer: Self-pay

## 2019-09-02 VITALS — BP 142/79 | HR 86 | Temp 98.2°F | Resp 16 | Ht 69.0 in

## 2019-09-02 DIAGNOSIS — C7931 Secondary malignant neoplasm of brain: Secondary | ICD-10-CM

## 2019-09-02 DIAGNOSIS — E860 Dehydration: Secondary | ICD-10-CM

## 2019-09-02 DIAGNOSIS — C3491 Malignant neoplasm of unspecified part of right bronchus or lung: Secondary | ICD-10-CM

## 2019-09-02 DIAGNOSIS — Z95828 Presence of other vascular implants and grafts: Secondary | ICD-10-CM

## 2019-09-02 DIAGNOSIS — K5900 Constipation, unspecified: Secondary | ICD-10-CM | POA: Diagnosis not present

## 2019-09-02 DIAGNOSIS — C7951 Secondary malignant neoplasm of bone: Secondary | ICD-10-CM

## 2019-09-02 MED ORDER — HEPARIN SOD (PORK) LOCK FLUSH 100 UNIT/ML IV SOLN
500.0000 [IU] | Freq: Once | INTRAVENOUS | Status: AC
  Administered 2019-09-02: 16:00:00 500 [IU]
  Filled 2019-09-02: qty 5

## 2019-09-02 MED ORDER — SODIUM CHLORIDE 0.9% FLUSH
10.0000 mL | Freq: Once | INTRAVENOUS | Status: AC
  Administered 2019-09-02: 10 mL
  Filled 2019-09-02: qty 10

## 2019-09-02 MED ORDER — SODIUM CHLORIDE 0.9 % IV SOLN
Freq: Once | INTRAVENOUS | Status: AC
  Administered 2019-09-02: 14:00:00 via INTRAVENOUS
  Filled 2019-09-02: qty 250

## 2019-09-02 NOTE — Patient Instructions (Signed)

## 2019-09-02 NOTE — Progress Notes (Signed)
Pt received 1L IVF NS today, tolerated well.  Able to tolerate oral fluids as well, declines food at this time.  VSS.  Pt placed on 2L [place] oxygen d/t sats in high 80's, increased to 94-100%, PA Lucianne Lei aware.  Pt will not be sent home with supplementary oxygen at this time (at end of infusion sats at 94% with several minutes off oxygen tank) but palliative care consult has been discussed for oxygen therapy/other needs.  Pt/wife agree to plan at this time.

## 2019-09-03 NOTE — Progress Notes (Signed)
Patient presents to clinic today along with his wife to receive additional IV hydration after being seen yesterday.  They are unsure if he wants to receive additional chemotherapy.  He would like to be seen by palliative care at his home to find out what additional services may be available to him.  He does not want to be treated as scheduled on 09/13/2019 as he wants to feel the best that he can at Christmas.  He continues to have fatigue, weakness, and anorexia.  He had 2 small bowel movements yesterday.  This was discussed with Dr. Julien Nordmann who is in agreement with holding the patient's next treatment.  He would like to see him on 09/13/2019 for follow-up.  Sandi Mealy, MHS, PA-C Physician Assistant

## 2019-09-06 ENCOUNTER — Ambulatory Visit: Payer: Medicare Other

## 2019-09-06 ENCOUNTER — Other Ambulatory Visit: Payer: Medicare Other

## 2019-09-06 ENCOUNTER — Telehealth: Payer: Self-pay

## 2019-09-06 NOTE — Telephone Encounter (Signed)
Mr. Purdy passed away on 09/05/2019

## 2019-09-09 ENCOUNTER — Ambulatory Visit (HOSPITAL_COMMUNITY): Admission: RE | Admit: 2019-09-09 | Payer: Medicare Other | Source: Ambulatory Visit

## 2019-09-09 ENCOUNTER — Other Ambulatory Visit: Payer: Medicare Other

## 2019-09-13 ENCOUNTER — Ambulatory Visit: Payer: Medicare Other

## 2019-09-13 ENCOUNTER — Inpatient Hospital Stay: Payer: Medicare Other | Admitting: Nutrition

## 2019-09-13 ENCOUNTER — Inpatient Hospital Stay: Payer: Medicare Other

## 2019-09-14 ENCOUNTER — Ambulatory Visit: Payer: Self-pay | Admitting: Radiation Oncology

## 2019-09-24 DEATH — deceased

## 2019-10-04 ENCOUNTER — Other Ambulatory Visit: Payer: Medicare Other

## 2019-10-04 ENCOUNTER — Ambulatory Visit: Payer: Medicare Other

## 2019-10-04 ENCOUNTER — Ambulatory Visit: Payer: Medicare Other | Admitting: Physician Assistant

## 2020-01-02 IMAGING — CT CT CERVICAL SPINE W/O CM
3 series · 14 of 33 positions shown, 17 images · non-contrast
Comparison: 10/14/2018

CLINICAL DATA: Fall, head injury, laceration above left eye

EXAM:
CT HEAD WITHOUT CONTRAST
CT CERVICAL SPINE WITHOUT CONTRAST
TECHNIQUE: Multidetector CT imaging of the head and cervical spine was
performed following the standard protocol without intravenous
contrast. Multiplanar CT image reconstructions of the cervical spine
were also generated.

[Series 6: c_spine 2.0 sag bone · sagittal · 0.34mm/px · 5 of 61 slices shown, 6 images]
[im 21/61  bone]
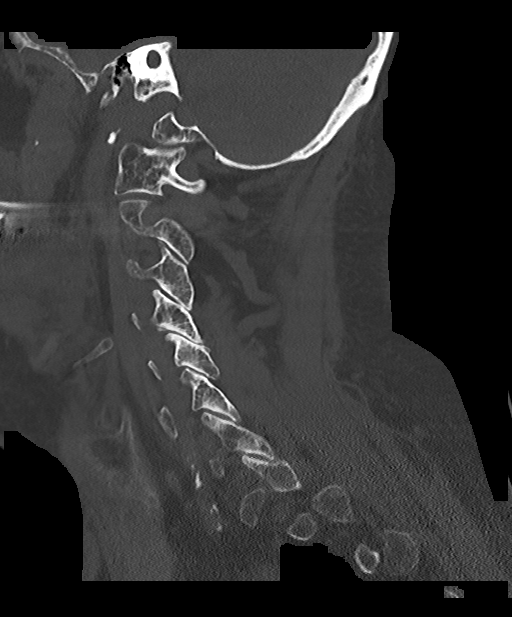
[im 26/61  bone]
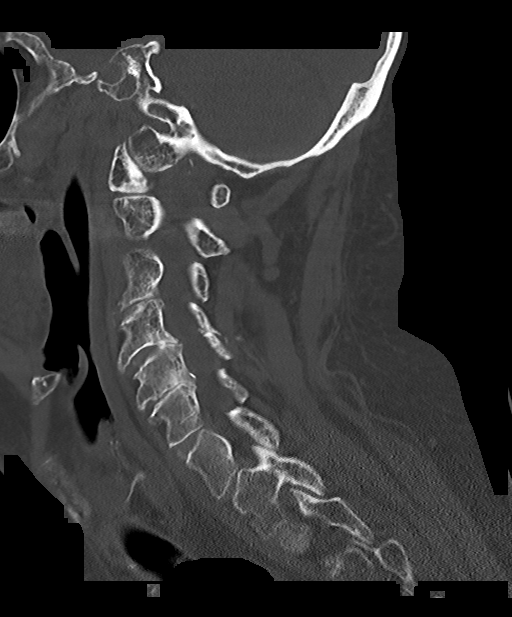
[im 31/61  soft-tissue]
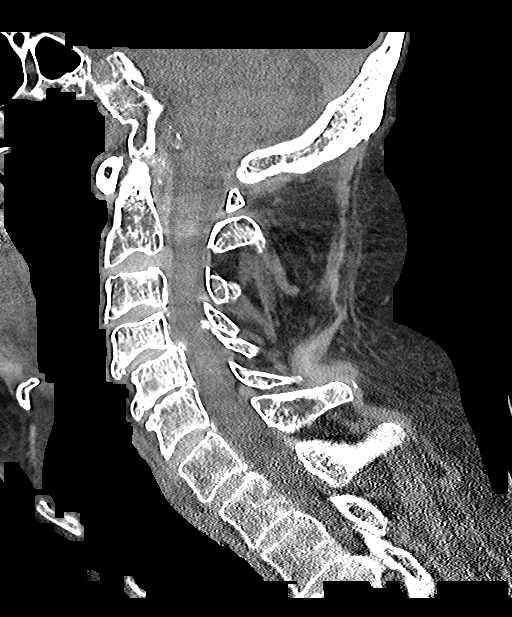
[im 31/61  bone]
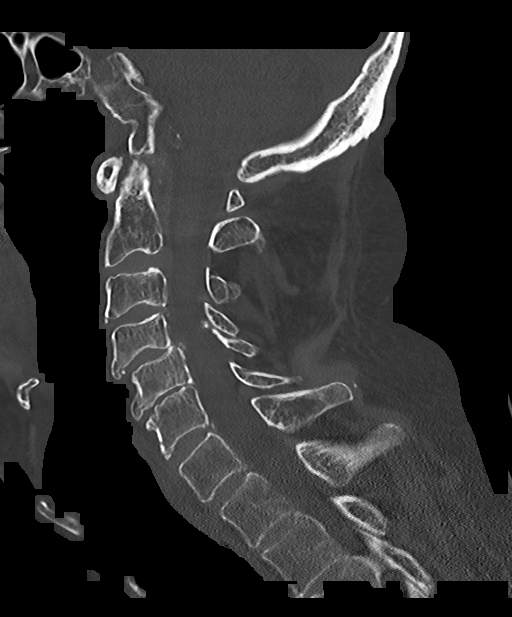
[im 36/61  bone]
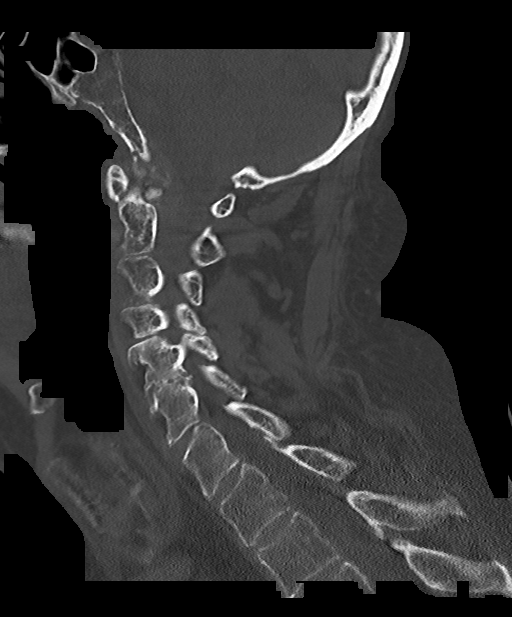
[im 41/61  bone]
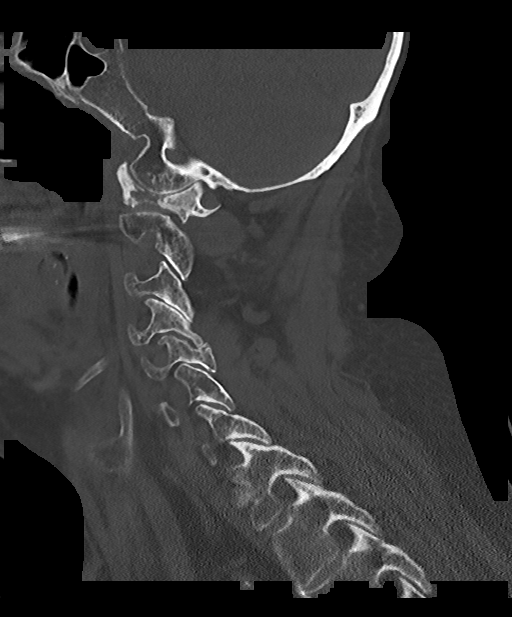

[Series 7: c_spine 2.0 cor bone · coronal · 0.35mm/px · 3 of 76 slices shown]
[im 16/76  bone]
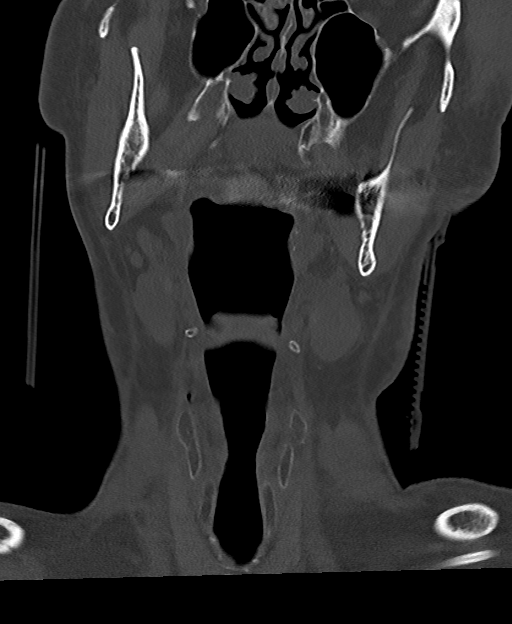
[im 31/76  bone]
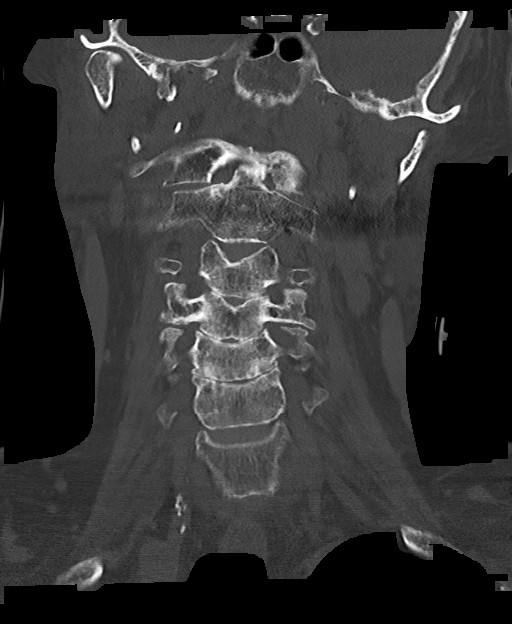
[im 46/76  bone]
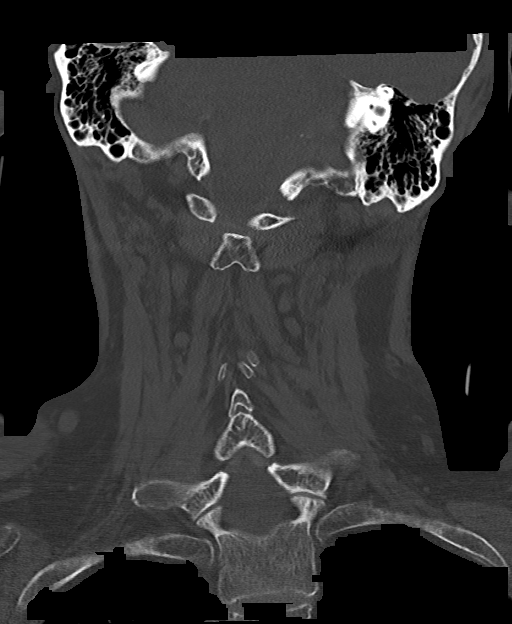

[Series 11: c_spine 2.0 st · axial · 0.34mm/px · z∈[-298,-150]mm · 6 of 97 slices shown, 8 images]
[im 15/97  soft-tissue]
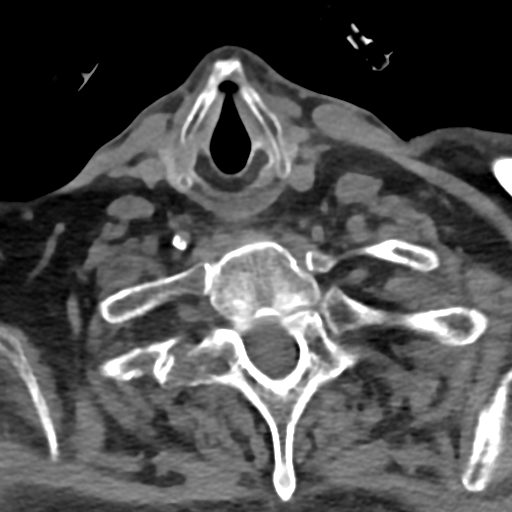
[im 15/97  bone]
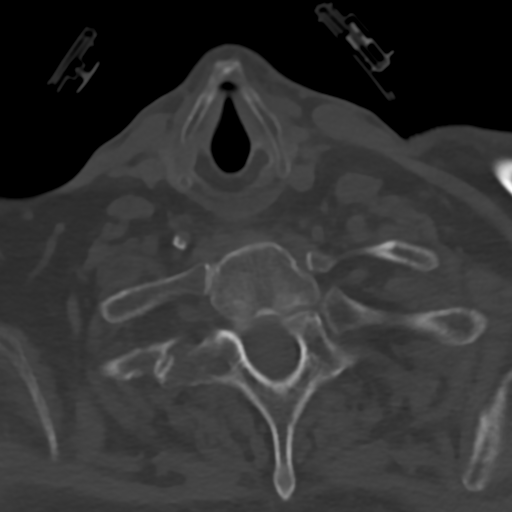
[im 30/97  bone]
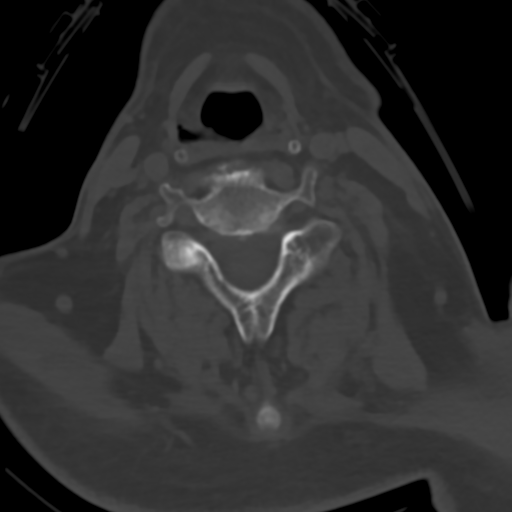
[im 45/97  bone]
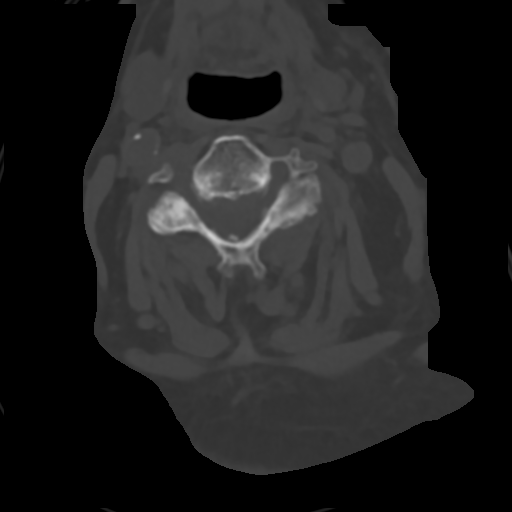
[im 60/97  bone]
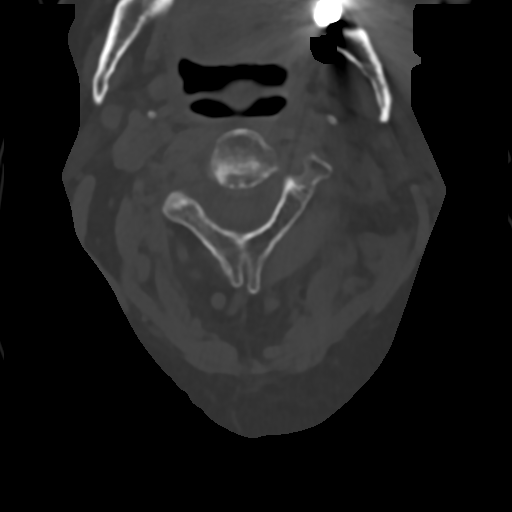
[im 74/97  soft-tissue]
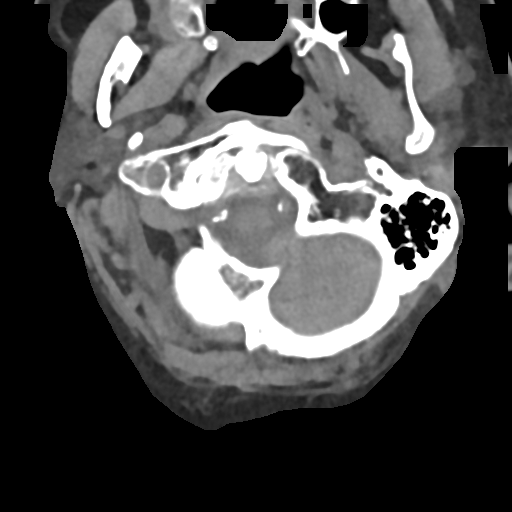
[im 74/97  bone]
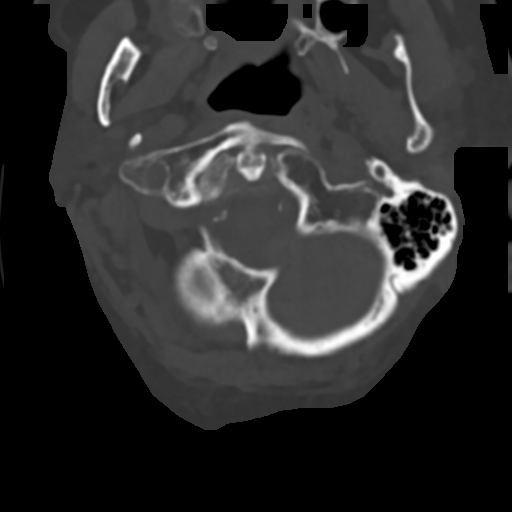
[im 89/97  bone]
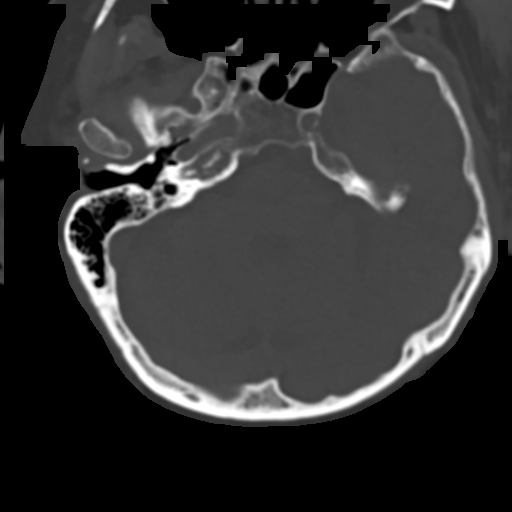

[14 of 33 positions shown; findings below may reference images not displayed]

FINDINGS: CT HEAD FINDINGS

Brain: No evidence of acute infarction, hemorrhage, hydrocephalus,
extra-axial collection or mass lesion/mass effect. Periventricular
white matter hypodensity.

Vascular: No hyperdense vessel or unexpected calcification.

Skull: Normal. Negative for fracture or focal lesion.

Sinuses/Orbits: No acute finding.

Other: Soft tissue laceration and hematoma of the left cheek and
forehead.

CT CERVICAL SPINE FINDINGS

Alignment: Normal.

Skull base and vertebrae: No acute fracture. No primary bone lesion
or focal pathologic process. Incidental note of congenital posterior
nonunion of C1, which does not reflect a fracture.

Soft tissues and spinal canal: No prevertebral fluid or swelling. No
visible canal hematoma.

Disc levels: Moderate disc space height loss and osteophytosis at C4
through C6. Disc spaces are otherwise preserved.

Upper chest: Negative.

Other: None.
IMPRESSION: 1.  No acute intracranial pathology.

2. Soft tissue laceration and hematoma of the left cheek and
forehead.

3.  No fracture or static subluxation of the cervical spine.

## 2020-01-02 IMAGING — CT CT HEAD W/O CM
4 series · 16 of 47 positions shown, 18 images · non-contrast
Comparison: 10/14/2018

CLINICAL DATA: Fall, head injury, laceration above left eye

EXAM:
CT HEAD WITHOUT CONTRAST
CT CERVICAL SPINE WITHOUT CONTRAST
TECHNIQUE: Multidetector CT imaging of the head and cervical spine was
performed following the standard protocol without intravenous
contrast. Multiplanar CT image reconstructions of the cervical spine
were also generated.

[Series 1: head without · axial · non-contrast · 0.43mm/px · z∈[-152,-27]mm · 7 of 35 slices shown, 9 images]
[im 5/35  brain]
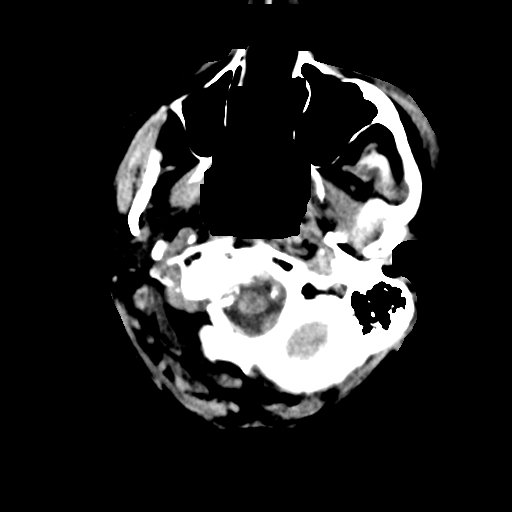
[im 5/35  bone]
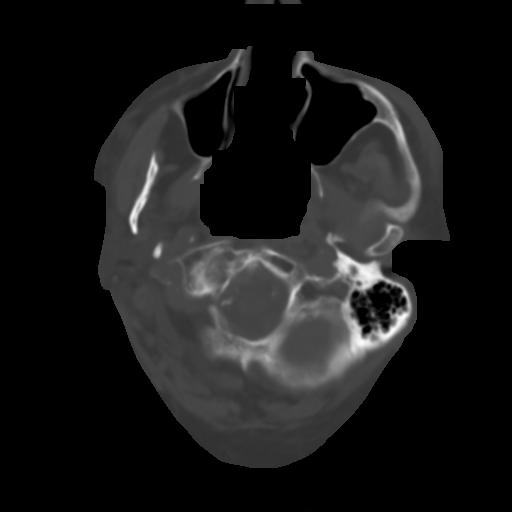
[im 9/35  brain]
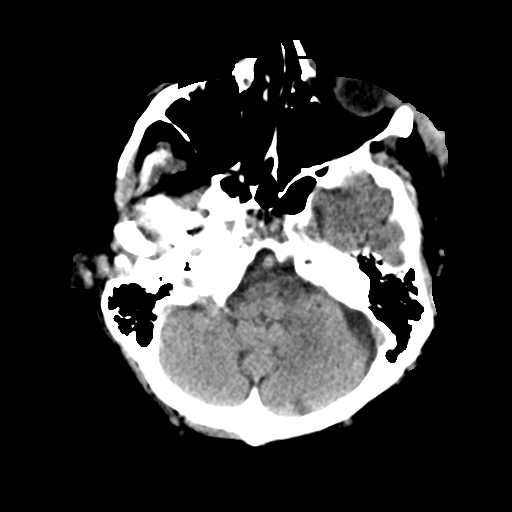
[im 13/35  brain]
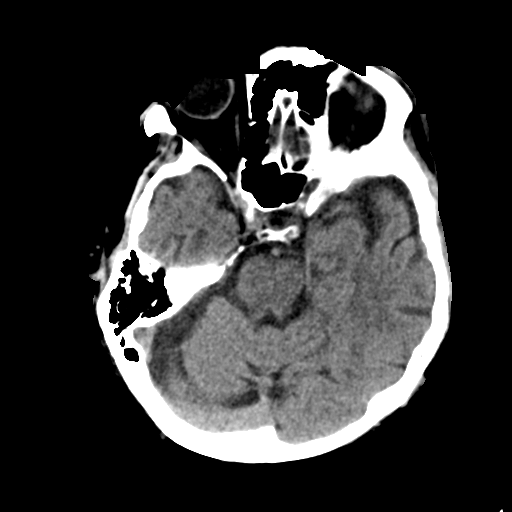
[im 18/35  brain]
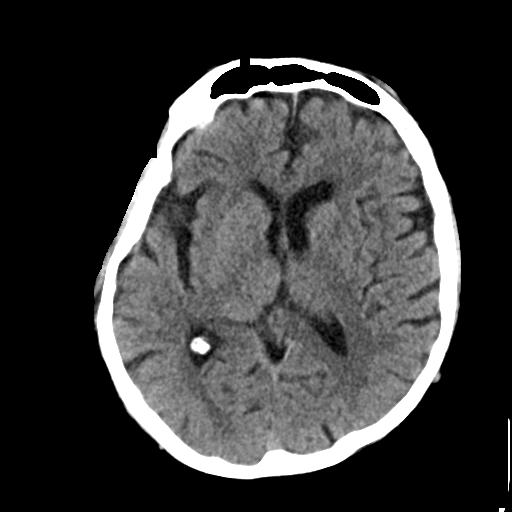
[im 22/35  brain]
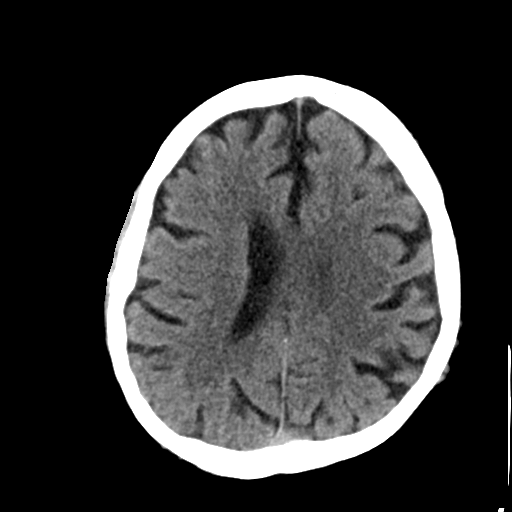
[im 22/35  bone]
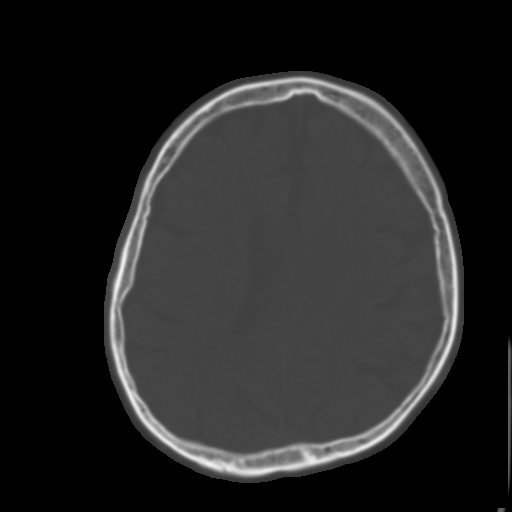
[im 26/35  brain]
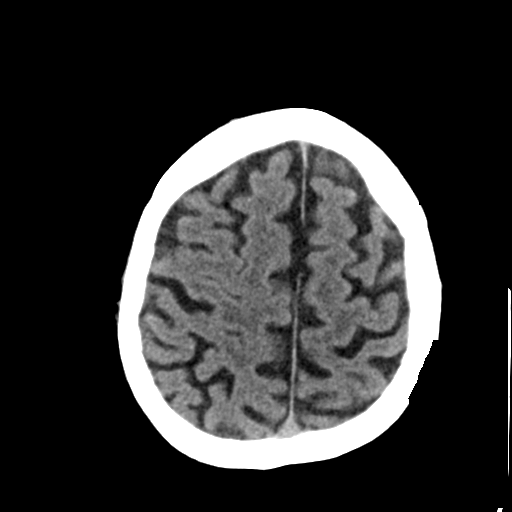
[im 30/35  brain]
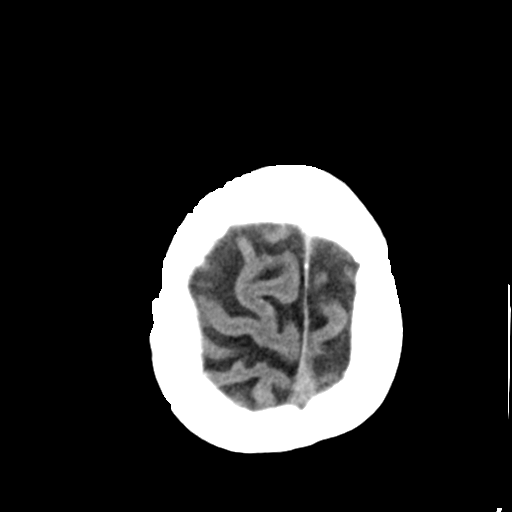

[Series 4: head bone · axial · 0.43mm/px · z∈[-156,-122]mm · 3 of 87 slices shown]
[im 9/87  bone]
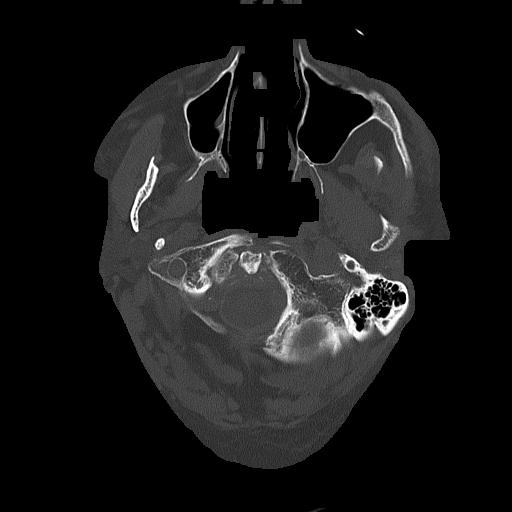
[im 18/87  bone]
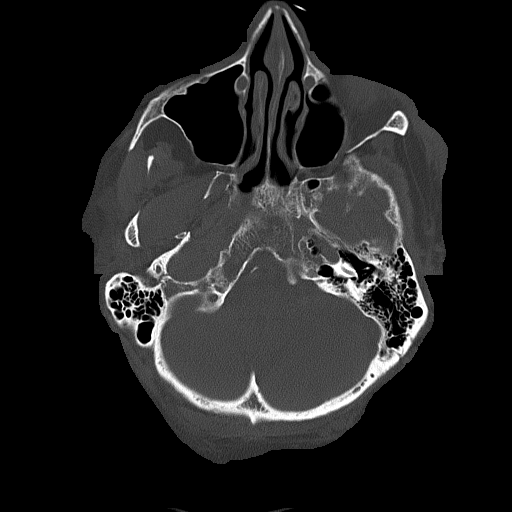
[im 26/87  bone]
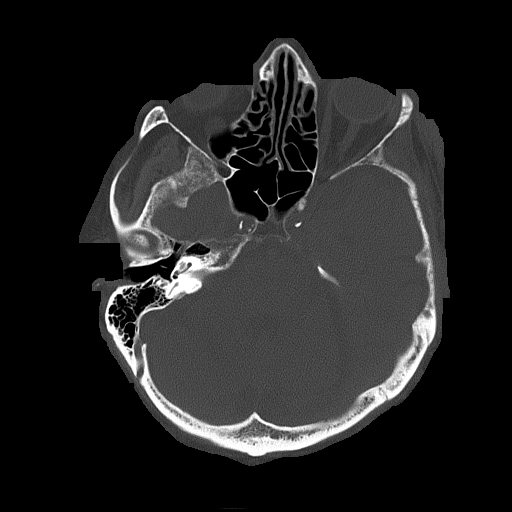

[Series 5: head without cor · coronal · non-contrast · 0.37mm/px · 3 of 70 slices shown]
[im 24/70  brain]
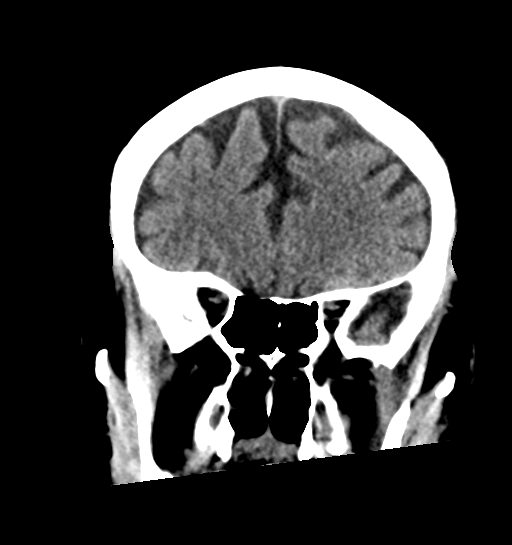
[im 31/70  brain]
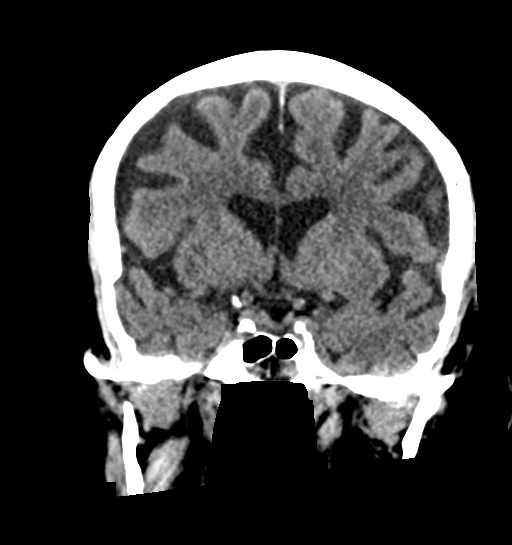
[im 39/70  brain]
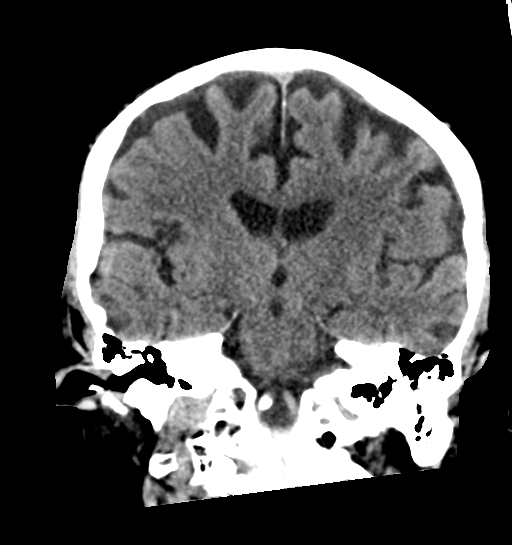

[Series 6: head without sag · sagittal · non-contrast · 0.36mm/px · 3 of 58 slices shown]
[im 23/58  brain]
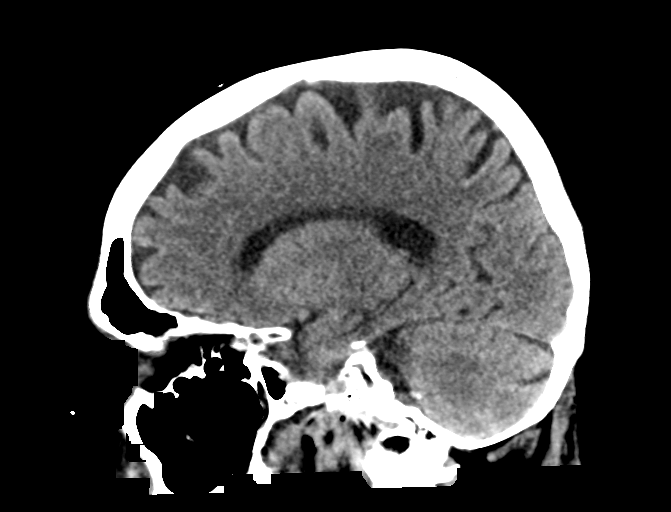
[im 29/58  brain]
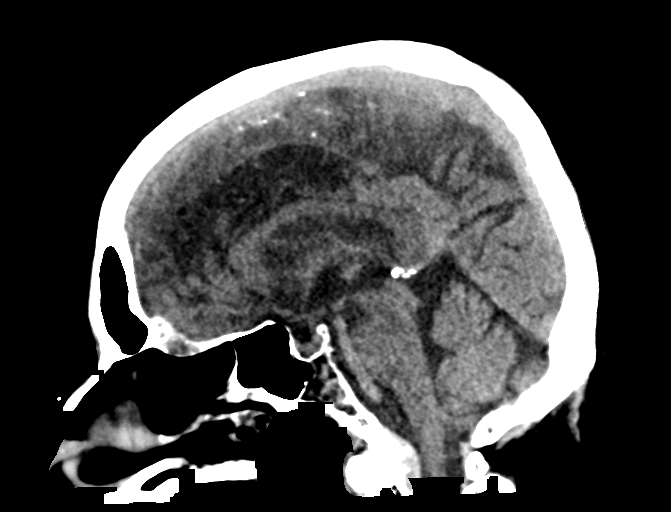
[im 36/58  brain]
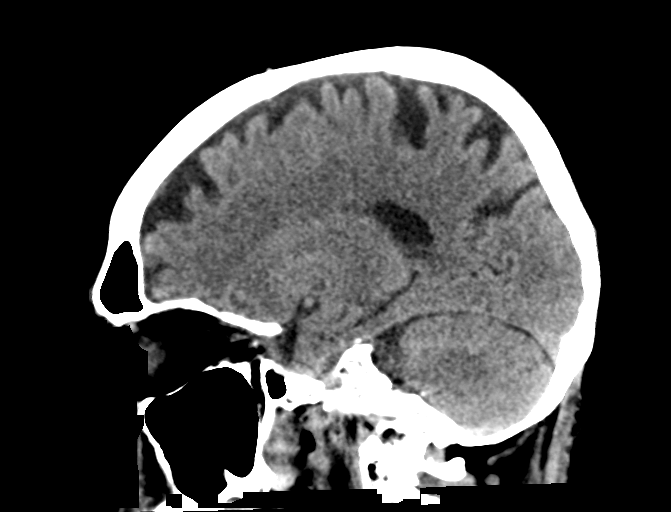

[16 of 47 positions shown; findings below may reference images not displayed]

FINDINGS: CT HEAD FINDINGS

Brain: No evidence of acute infarction, hemorrhage, hydrocephalus,
extra-axial collection or mass lesion/mass effect. Periventricular
white matter hypodensity.

Vascular: No hyperdense vessel or unexpected calcification.

Skull: Normal. Negative for fracture or focal lesion.

Sinuses/Orbits: No acute finding.

Other: Soft tissue laceration and hematoma of the left cheek and
forehead.

CT CERVICAL SPINE FINDINGS

Alignment: Normal.

Skull base and vertebrae: No acute fracture. No primary bone lesion
or focal pathologic process. Incidental note of congenital posterior
nonunion of C1, which does not reflect a fracture.

Soft tissues and spinal canal: No prevertebral fluid or swelling. No
visible canal hematoma.

Disc levels: Moderate disc space height loss and osteophytosis at C4
through C6. Disc spaces are otherwise preserved.

Upper chest: Negative.

Other: None.
IMPRESSION: 1.  No acute intracranial pathology.

2. Soft tissue laceration and hematoma of the left cheek and
forehead.

3.  No fracture or static subluxation of the cervical spine.
